# Patient Record
Sex: Male | Born: 1965
Health system: Southern US, Community
[De-identification: ages and names within clinical notes are randomized; demographics above are authoritative.]

## PROBLEM LIST (undated history)

## (undated) DIAGNOSIS — K219 Gastro-esophageal reflux disease without esophagitis: Secondary | ICD-10-CM

## (undated) DIAGNOSIS — M549 Dorsalgia, unspecified: Secondary | ICD-10-CM

## (undated) DIAGNOSIS — F319 Bipolar disorder, unspecified: Secondary | ICD-10-CM

## (undated) DIAGNOSIS — F4310 Post-traumatic stress disorder, unspecified: Secondary | ICD-10-CM

## (undated) DIAGNOSIS — G8929 Other chronic pain: Secondary | ICD-10-CM

## (undated) DIAGNOSIS — I10 Essential (primary) hypertension: Secondary | ICD-10-CM

## (undated) HISTORY — DX: Bipolar disorder, unspecified: F31.9

## (undated) HISTORY — DX: Essential (primary) hypertension: I10

## (undated) HISTORY — DX: Other chronic pain: G89.29

## (undated) HISTORY — DX: Dorsalgia, unspecified: M54.9

## (undated) HISTORY — DX: Post-traumatic stress disorder, unspecified: F43.10

## (undated) HISTORY — DX: Gastro-esophageal reflux disease without esophagitis: K21.9

## (undated) HISTORY — PX: NO PAST SURGERIES: SHX2092

---

## 2000-07-01 ENCOUNTER — Ambulatory Visit (HOSPITAL_COMMUNITY): Admission: RE | Admit: 2000-07-01 | Discharge: 2000-07-01 | Payer: Self-pay | Admitting: Family Medicine

## 2000-07-01 ENCOUNTER — Encounter: Payer: Self-pay | Admitting: Family Medicine

## 2000-07-09 ENCOUNTER — Encounter: Payer: Self-pay | Admitting: Family Medicine

## 2000-07-09 ENCOUNTER — Ambulatory Visit (HOSPITAL_COMMUNITY): Admission: RE | Admit: 2000-07-09 | Discharge: 2000-07-09 | Payer: Self-pay | Admitting: Family Medicine

## 2000-08-19 ENCOUNTER — Ambulatory Visit (HOSPITAL_BASED_OUTPATIENT_CLINIC_OR_DEPARTMENT_OTHER): Admission: RE | Admit: 2000-08-19 | Discharge: 2000-08-19 | Payer: Self-pay | Admitting: *Deleted

## 2003-09-08 ENCOUNTER — Ambulatory Visit (HOSPITAL_COMMUNITY): Admission: RE | Admit: 2003-09-08 | Discharge: 2003-09-08 | Payer: Self-pay | Admitting: Family Medicine

## 2003-10-19 ENCOUNTER — Emergency Department (HOSPITAL_COMMUNITY): Admission: EM | Admit: 2003-10-19 | Discharge: 2003-10-19 | Payer: Self-pay | Admitting: *Deleted

## 2003-11-16 ENCOUNTER — Ambulatory Visit (HOSPITAL_COMMUNITY): Admission: RE | Admit: 2003-11-16 | Discharge: 2003-11-16 | Payer: Self-pay | Admitting: Family Medicine

## 2003-11-30 ENCOUNTER — Ambulatory Visit: Payer: Self-pay | Admitting: Family Medicine

## 2003-12-28 ENCOUNTER — Ambulatory Visit: Payer: Self-pay | Admitting: Family Medicine

## 2004-01-26 ENCOUNTER — Ambulatory Visit: Payer: Self-pay | Admitting: Family Medicine

## 2004-04-01 ENCOUNTER — Ambulatory Visit: Payer: Self-pay | Admitting: Family Medicine

## 2004-04-24 ENCOUNTER — Ambulatory Visit: Payer: Self-pay | Admitting: Family Medicine

## 2004-05-01 ENCOUNTER — Encounter (HOSPITAL_COMMUNITY): Admission: RE | Admit: 2004-05-01 | Discharge: 2004-05-31 | Payer: Self-pay | Admitting: Family Medicine

## 2004-05-15 ENCOUNTER — Ambulatory Visit: Payer: Self-pay | Admitting: Family Medicine

## 2004-06-14 ENCOUNTER — Ambulatory Visit: Payer: Self-pay | Admitting: Family Medicine

## 2004-07-24 ENCOUNTER — Ambulatory Visit: Payer: Self-pay | Admitting: Family Medicine

## 2004-07-26 ENCOUNTER — Ambulatory Visit (HOSPITAL_COMMUNITY): Admission: RE | Admit: 2004-07-26 | Discharge: 2004-07-26 | Payer: Self-pay | Admitting: Family Medicine

## 2004-07-31 ENCOUNTER — Ambulatory Visit: Payer: Self-pay | Admitting: Family Medicine

## 2004-09-09 ENCOUNTER — Ambulatory Visit: Payer: Self-pay | Admitting: Family Medicine

## 2005-10-13 ENCOUNTER — Ambulatory Visit: Payer: Self-pay | Admitting: Family Medicine

## 2006-04-06 ENCOUNTER — Ambulatory Visit: Payer: Self-pay | Admitting: Family Medicine

## 2006-04-16 LAB — HM COLONOSCOPY: HM Colonoscopy: NORMAL

## 2006-10-26 ENCOUNTER — Ambulatory Visit: Payer: Self-pay | Admitting: Family Medicine

## 2006-11-17 ENCOUNTER — Emergency Department (HOSPITAL_COMMUNITY): Admission: EM | Admit: 2006-11-17 | Discharge: 2006-11-17 | Payer: Self-pay | Admitting: Emergency Medicine

## 2006-11-26 ENCOUNTER — Ambulatory Visit: Payer: Self-pay | Admitting: Family Medicine

## 2006-12-08 ENCOUNTER — Ambulatory Visit (HOSPITAL_COMMUNITY): Admission: RE | Admit: 2006-12-08 | Discharge: 2006-12-08 | Payer: Self-pay | Admitting: Family Medicine

## 2007-01-18 ENCOUNTER — Ambulatory Visit: Payer: Self-pay | Admitting: Family Medicine

## 2007-01-28 ENCOUNTER — Encounter: Payer: Self-pay | Admitting: Family Medicine

## 2007-08-13 DIAGNOSIS — K219 Gastro-esophageal reflux disease without esophagitis: Secondary | ICD-10-CM | POA: Insufficient documentation

## 2007-08-13 DIAGNOSIS — J329 Chronic sinusitis, unspecified: Secondary | ICD-10-CM | POA: Insufficient documentation

## 2007-08-13 DIAGNOSIS — J302 Other seasonal allergic rhinitis: Secondary | ICD-10-CM | POA: Insufficient documentation

## 2007-08-13 DIAGNOSIS — M549 Dorsalgia, unspecified: Secondary | ICD-10-CM | POA: Insufficient documentation

## 2007-08-13 HISTORY — DX: Chronic sinusitis, unspecified: J32.9

## 2007-11-02 ENCOUNTER — Ambulatory Visit: Payer: Self-pay | Admitting: Family Medicine

## 2007-11-05 ENCOUNTER — Encounter: Payer: Self-pay | Admitting: Family Medicine

## 2007-11-05 LAB — CONVERTED CEMR LAB
BUN: 10 mg/dL (ref 6–23)
Basophils Absolute: 0 10*3/uL (ref 0.0–0.1)
Basophils Relative: 1 % (ref 0–1)
CO2: 24 meq/L (ref 19–32)
Calcium: 8.7 mg/dL (ref 8.4–10.5)
Chloride: 107 meq/L (ref 96–112)
Cholesterol: 154 mg/dL (ref 0–200)
Creatinine, Ser: 1.16 mg/dL (ref 0.40–1.50)
Eosinophils Absolute: 0.6 10*3/uL (ref 0.0–0.7)
Eosinophils Relative: 14 % — ABNORMAL HIGH (ref 0–5)
Glucose, Bld: 78 mg/dL (ref 70–99)
HCT: 44.6 % (ref 39.0–52.0)
HDL: 50 mg/dL (ref >39)
Hemoglobin: 14.4 g/dL (ref 13.0–17.0)
LDL Cholesterol: 94 mg/dL (ref 0–99)
Lymphocytes Relative: 54 % — ABNORMAL HIGH (ref 12–46)
Lymphs Abs: 2.2 10*3/uL (ref 0.7–4.0)
MCHC: 32.3 g/dL (ref 30.0–36.0)
MCV: 88.7 fL (ref 78.0–100.0)
Monocytes Absolute: 0.3 10*3/uL (ref 0.1–1.0)
Monocytes Relative: 7 % (ref 3–12)
Neutro Abs: 1 10*3/uL — ABNORMAL LOW (ref 1.7–7.7)
Neutrophils Relative %: 25 % — ABNORMAL LOW (ref 43–77)
PSA: 0.59 ng/mL (ref 0.10–4.00)
Platelets: 189 10*3/uL (ref 150–400)
Potassium: 4.7 meq/L (ref 3.5–5.3)
RBC: 5.03 M/uL (ref 4.22–5.81)
RDW: 12.6 % (ref 11.5–15.5)
Sodium: 142 meq/L (ref 135–145)
Total CHOL/HDL Ratio: 3.1
Triglycerides: 50 mg/dL (ref <150)
VLDL: 10 mg/dL (ref 0–40)
WBC: 4 10*3/uL (ref 4.0–10.5)

## 2007-11-07 DIAGNOSIS — F528 Other sexual dysfunction not due to a substance or known physiological condition: Secondary | ICD-10-CM | POA: Insufficient documentation

## 2007-11-19 ENCOUNTER — Telehealth: Payer: Self-pay | Admitting: Family Medicine

## 2008-04-13 ENCOUNTER — Ambulatory Visit: Payer: Self-pay | Admitting: Family Medicine

## 2008-04-13 DIAGNOSIS — IMO0002 Reserved for concepts with insufficient information to code with codable children: Secondary | ICD-10-CM | POA: Insufficient documentation

## 2008-04-14 ENCOUNTER — Telehealth: Payer: Self-pay | Admitting: Family Medicine

## 2008-04-14 ENCOUNTER — Encounter: Payer: Self-pay | Admitting: Family Medicine

## 2008-04-18 ENCOUNTER — Telehealth: Payer: Self-pay | Admitting: Family Medicine

## 2008-05-02 ENCOUNTER — Telehealth: Payer: Self-pay | Admitting: Family Medicine

## 2008-06-12 ENCOUNTER — Telehealth: Payer: Self-pay | Admitting: Family Medicine

## 2008-06-19 ENCOUNTER — Ambulatory Visit: Payer: Self-pay | Admitting: Family Medicine

## 2008-06-19 DIAGNOSIS — I10 Essential (primary) hypertension: Secondary | ICD-10-CM | POA: Insufficient documentation

## 2008-09-25 ENCOUNTER — Ambulatory Visit: Payer: Self-pay | Admitting: Family Medicine

## 2008-09-28 ENCOUNTER — Telehealth: Payer: Self-pay | Admitting: Family Medicine

## 2008-12-05 ENCOUNTER — Ambulatory Visit: Payer: Self-pay | Admitting: Family Medicine

## 2008-12-05 ENCOUNTER — Ambulatory Visit (HOSPITAL_COMMUNITY): Admission: RE | Admit: 2008-12-05 | Discharge: 2008-12-05 | Payer: Self-pay | Admitting: Family Medicine

## 2008-12-05 DIAGNOSIS — R109 Unspecified abdominal pain: Secondary | ICD-10-CM | POA: Insufficient documentation

## 2008-12-05 DIAGNOSIS — R1084 Generalized abdominal pain: Secondary | ICD-10-CM | POA: Insufficient documentation

## 2008-12-05 DIAGNOSIS — N3000 Acute cystitis without hematuria: Secondary | ICD-10-CM | POA: Insufficient documentation

## 2008-12-05 DIAGNOSIS — R079 Chest pain, unspecified: Secondary | ICD-10-CM | POA: Insufficient documentation

## 2008-12-05 DIAGNOSIS — J310 Chronic rhinitis: Secondary | ICD-10-CM | POA: Insufficient documentation

## 2008-12-05 LAB — CONVERTED CEMR LAB
Amylase: 104 units/L (ref 27–131)
Bilirubin Urine: NEGATIVE
Blood in Urine, dipstick: NEGATIVE
Glucose, Urine, Semiquant: NEGATIVE
Ketones, urine, test strip: NEGATIVE
Nitrite: NEGATIVE
Protein, U semiquant: NEGATIVE
Specific Gravity, Urine: 1.02
Urobilinogen, UA: 0.2
WBC Urine, dipstick: NEGATIVE
pH: 6.5

## 2008-12-06 ENCOUNTER — Encounter: Payer: Self-pay | Admitting: Family Medicine

## 2008-12-06 LAB — CONVERTED CEMR LAB
ALT: 13 units/L (ref 0–53)
AST: 18 units/L (ref 0–37)
Albumin: 4.3 g/dL (ref 3.5–5.2)
Alkaline Phosphatase: 56 units/L (ref 39–117)
BUN: 14 mg/dL (ref 6–23)
Bilirubin, Direct: 0.1 mg/dL (ref 0.0–0.3)
CO2: 27 meq/L (ref 19–32)
Calcium: 9.2 mg/dL (ref 8.4–10.5)
Chloride: 103 meq/L (ref 96–112)
Creatinine, Ser: 0.99 mg/dL (ref 0.40–1.50)
Glucose, Bld: 80 mg/dL (ref 70–99)
Indirect Bilirubin: 0.4 mg/dL (ref 0.0–0.9)
Lipase: 22 units/L (ref 0–75)
PSA: 0.9 ng/mL (ref 0.10–4.00)
Potassium: 4.3 meq/L (ref 3.5–5.3)
Sodium: 139 meq/L (ref 135–145)
Total Bilirubin: 0.5 mg/dL (ref 0.3–1.2)
Total Protein: 7.3 g/dL (ref 6.0–8.3)

## 2008-12-07 ENCOUNTER — Ambulatory Visit (HOSPITAL_COMMUNITY): Admission: RE | Admit: 2008-12-07 | Discharge: 2008-12-07 | Payer: Self-pay | Admitting: Family Medicine

## 2008-12-08 ENCOUNTER — Telehealth: Payer: Self-pay | Admitting: Family Medicine

## 2009-05-17 ENCOUNTER — Telehealth: Payer: Self-pay | Admitting: Family Medicine

## 2009-07-07 ENCOUNTER — Emergency Department (HOSPITAL_COMMUNITY): Admission: EM | Admit: 2009-07-07 | Discharge: 2009-07-07 | Payer: Self-pay | Admitting: Emergency Medicine

## 2009-07-09 ENCOUNTER — Telehealth: Payer: Self-pay | Admitting: Family Medicine

## 2009-08-14 ENCOUNTER — Telehealth: Payer: Self-pay | Admitting: Family Medicine

## 2009-09-05 ENCOUNTER — Ambulatory Visit: Payer: Self-pay | Admitting: Family Medicine

## 2009-09-05 DIAGNOSIS — L0390 Cellulitis, unspecified: Secondary | ICD-10-CM

## 2009-09-05 DIAGNOSIS — L0291 Cutaneous abscess, unspecified: Secondary | ICD-10-CM | POA: Insufficient documentation

## 2009-09-06 ENCOUNTER — Ambulatory Visit: Payer: Self-pay | Admitting: Family Medicine

## 2009-09-07 ENCOUNTER — Telehealth: Payer: Self-pay | Admitting: Family Medicine

## 2009-09-13 ENCOUNTER — Ambulatory Visit: Payer: Self-pay | Admitting: Family Medicine

## 2009-10-19 ENCOUNTER — Telehealth: Payer: Self-pay | Admitting: Family Medicine

## 2009-10-23 ENCOUNTER — Ambulatory Visit: Payer: Self-pay | Admitting: Family Medicine

## 2009-11-22 ENCOUNTER — Telehealth: Payer: Self-pay | Admitting: Family Medicine

## 2009-12-24 ENCOUNTER — Telehealth: Payer: Self-pay | Admitting: Family Medicine

## 2010-01-22 ENCOUNTER — Ambulatory Visit: Payer: Self-pay | Admitting: Family Medicine

## 2010-01-30 ENCOUNTER — Telehealth: Payer: Self-pay | Admitting: Family Medicine

## 2010-01-31 ENCOUNTER — Telehealth: Payer: Self-pay | Admitting: Family Medicine

## 2010-02-07 ENCOUNTER — Encounter: Payer: Self-pay | Admitting: Family Medicine

## 2010-02-20 DIAGNOSIS — R519 Headache, unspecified: Secondary | ICD-10-CM | POA: Insufficient documentation

## 2010-02-20 DIAGNOSIS — R51 Headache: Secondary | ICD-10-CM | POA: Insufficient documentation

## 2010-02-28 NOTE — Assessment & Plan Note (Signed)
Summary: recheck Boil ROOM 2   Vital Signs:  Patient profile:   45 year old male Height:      71 inches Weight:      192 pounds BMI:     26.88 O2 Sat:      97 % Pulse rate:   84 / minute Resp:     16 per minute BP sitting:   122 / 84  (left arm) Cuff size:   large  Vitals Entered By: Everitt Amber LPN (September 13, 2009 8:35 AM) CC: Follow up on boil, feeling alot better   CC:  Follow up on boil and feeling alot better.  History of Present Illness: Pt presents today for f/u of abscess Lt axilla.  Had I&D done and is taking antibiotic as prescribed. States it is feeling much better.  Swelling much smaller.  And not painful any longer. No drainage.  Current Medications (verified): 1)  Ibuprofen 800 Mg Tabs (Ibuprofen) .... One Tab By Mouth Tid 2)  Vicodin Hp 10-660 Mg Tabs (Hydrocodone-Acetaminophen) .... Take 1 Tab By Mouth At Bedtime As Needed 3)  Doxycycline Hyclate 100 Mg Caps (Doxycycline Hyclate) .... Take 1 Capsule By Mouth Two Times A Day 4)  Benazepril Hcl 10 Mg Tabs (Benazepril Hcl) .... Take 1 Tablet By Mouth Once A Day  Allergies (verified): 1)  ! Sulfa  Review of Systems General:  Denies chills and fever.  Physical Exam  General:  Well-developed,well-nourished,in no acute distress; alert,appropriate and cooperative throughout examination Skin:  Abscess Lt axilla significantly improved.  Mild induration noted at abscess site, but mass/swelling no longer palpable.  Very faint erythema.  Nontender Axillary Nodes:  no L axillary adenopathy.     Impression & Recommendations:  Problem # 1:  ABSCESS (ICD-682.9) Assessment Improved Advised pt to complete antibiotic prescription as prescribed.  Recheck if recurrs.  His updated medication list for this problem includes:    Doxycycline Hyclate 100 Mg Caps (Doxycycline hyclate) .Marland Kitchen... Take 1 capsule by mouth two times a day  Complete Medication List: 1)  Ibuprofen 800 Mg Tabs (Ibuprofen) .... One tab by mouth tid 2)   Vicodin Hp 10-660 Mg Tabs (Hydrocodone-acetaminophen) .... Take 1 tab by mouth at bedtime as needed 3)  Doxycycline Hyclate 100 Mg Caps (Doxycycline hyclate) .... Take 1 capsule by mouth two times a day 4)  Benazepril Hcl 10 Mg Tabs (Benazepril hcl) .... Take 1 tablet by mouth once a day  Patient Instructions: 1)  Please schedule a follow-up appointment as needed.  If your syptoms return call for an appoitment. 2)  Complete your antibiotic as prescribed.

## 2010-02-28 NOTE — Assessment & Plan Note (Signed)
Summary: OV   Vital Signs:  Patient profile:   45 year old male Height:      71 inches (180.34 cm) Weight:      191 pounds (86.82 kg) BMI:     26.74 BSA:     2.07 O2 Sat:      98 % Pulse rate:   91 / minute Pulse rhythm:   regular Resp:     16 per minute BP sitting:   140 / 90  (left arm) Cuff size:   regular  Vitals Entered By: Everitt Amber (Jun 19, 2008 3:10 PM)  Nutrition Counseling: Patient's BMI is greater than 25 and therefore counseled on weight management options. CC: rash under right armpit,itches and hurts   CC:  rash under right armpit and itches and hurts.  History of Present Illness: Pt. has 2 week h/o infection in his r axilla, this is the third episode in the past 4 months.He hs had no fever or chills, and this episode is not as severe as the initial one. He has never had i/D of the area.  He does not shave and has stopped usingdeodorant. He reports uncontrolled allergysymptoms though he iscurrently receiving immunotherapy. He denies depression or anxiety and remains unemployed.  Current Medications (verified): 1)  None  Allergies (verified): No Known Drug Allergies  Review of Systems      See HPI ENT:  Complains of nasal congestion and postnasal drainage; denies decreased hearing, sinus pressure, and sore throat. CV:  Denies chest pain or discomfort, palpitations, shortness of breath with exertion, and swelling of feet. Resp:  Denies cough, sputum productive, and wheezing. GI:  Denies abdominal pain, constipation, diarrhea, nausea, and vomiting. GU:  Denies dysuria and urinary frequency. Derm:  See HPI. Allergy:  Complains of seasonal allergies and sneezing; uncontrolled.  Physical Exam  General:  alert, well-nourished, and well-hydrated.  HEENT: No facial asymmetry,  EOMI, No sinus tenderness, TM's Clear, oropharynx  pink and moist. Erythema and edema of nasal mucosa.  Chest: Clear to auscultation bilaterally.  CVS: S1, S2, No murmurs, No S3.   Abd: Soft, Nontender.  MS: Adequate ROM spine, hips, shoulders and knees.  Ext: No edema.   CNS: CN 2-12 intact, power tone and sensation normal throughout.   Skin:hyperpigmented nodles with erythematous rash under right axilla, tender  Psych: Good eye contact, normal affect.  Memory intact, not anxious or depressed appearing.     Impression & Recommendations:  Problem # 1:  ESSENTIAL HYPERTENSION (ICD-401.9) Assessment Comment Only  His updated medication list for this problem includes:    Hydrochlorothiazide 25 Mg Tabs (Hydrochlorothiazide) .Marland Kitchen... Take 1 tablet by mouth once a daypt remains in denial and non compliant with meds  BP today: 140/90 Prior BP: 124/90 (04/13/2008)  Labs Reviewed: K+: 4.7 (11/05/2007) Creat: : 1.16 (11/05/2007)   Chol: 154 (11/05/2007)   HDL: 50 (11/05/2007)   LDL: 94 (11/05/2007)   TG: 50 (11/05/2007)  Problem # 2:  ABSCESS, AXILLA, RIGHT (ICD-682.3) Assessment: Comment Only  The following medications were removed from the medication list:    Doxycycline Hyclate 100 Mg Caps (Doxycycline hyclate) ..... One cap by mouth bid His updated medication list for this problem includes:    Doxycycline Hyclate 100 Mg Tabs (Doxycycline hyclate) .Marland Kitchen... Take 1 tablet by mouth two times a day  Orders: Rocephin  250mg  (Z6109) Admin of Therapeutic Inj  intramuscular or subcutaneous (60454)  Problem # 3:  ALLERGIC RHINITIS (ICD-477.9) Assessment: Deteriorated  His updated medication list for  this problem includes:    Xyzal 5 Mg Tabs (Levocetirizine dihydrochloride) .Marland Kitchen... Take 1 tablet by mouth once a day  Orders: Depo- Medrol 80mg  (J1040)  Complete Medication List: 1)  Doxycycline Hyclate 100 Mg Tabs (Doxycycline hyclate) .... Take 1 tablet by mouth two times a day 2)  Xyzal 5 Mg Tabs (Levocetirizine dihydrochloride) .... Take 1 tablet by mouth once a day 3)  Vicodin Es 7.5-750 Mg Tabs (Hydrocodone-acetaminophen) .... Take 1 tab by mouth at bedtime as  needed 4)  Bactroban 2 % Oint (Mupirocin) .... Apply to affected area twice daily as needed 5)  Hydrochlorothiazide 25 Mg Tabs (Hydrochlorothiazide) .... Take 1 tablet by mouth once a day 6)  Klor-con M20 20 Meq Cr-tabs (Potassium chloride crys cr) .... Take 1 tablet by mouth once a day  Other Orders: T-Lipid Profile [phone]) T-Basic Metabolic Panel [phone]) T-CBC w/Diff (32440-10272) T-PSA (53664-40347)  Patient Instructions: 1)  CPE in 4 months. 2)  you will get injections for allergy and infectiion and meds are sent to your pharmacy aklso. 3)  Your BP is high 140/90, meds have been sent in. 4)  BMP prior to visit, ICD-9: 5)  Lipid Panel prior to visit, ICD-9:   Mid to end october. 6)  CBC w/ Diff prior to visit, ICD-9: 7)  PSA prior to visit, ICD-9: Prescriptions: KLOR-CON M20 20 MEQ CR-TABS (POTASSIUM CHLORIDE CRYS CR) Take 1 tablet by mouth once a day  #30 x 4   Entered and Authorized by:   Syliva Overman MD   Signed by:   Syliva Overman MD on 06/19/2008   Method used:   [facility]       [address]       [place], [place]  [address]       Ph: [phone]       Fax: [phone]   RxID:   [id number] HYDROCHLOROTHIAZIDE 25 MG TABS (HYDROCHLOROTHIAZIDE) Take 1 tablet by mouth once a day  #30 x 4   Entered and Authorized by:   Syliva Overman MD   Signed by:   Syliva Overman MD on 06/19/2008   Method used:   [facility]        [facility] ([facility]ail)       [address]       [place], [place]  [address]       Ph: [phone]       Fax: [phone]   RxID:   [id number] BACTROBAN 2 % OINT (MUPIROCIN) apply to affected area twice daily as needed  #30gm x 1   Entered and Authorized by:   Syliva Overman MD   Signed by:   Syliva Overman MD on 06/19/2008   Method used:   [facility]        [facility] ([facility])       [address]       [place], [place]  [address]        Ph: [phone]       Fax: [phone]   RxID:   [id number] VICODIN ES 7.5-750 MG TABS (HYDROCODONE-ACETAMINOPHEN) Take 1 tab by mouth at bedtime as needed  #20 x 0   Entered and Authorized by:   Syliva Overman MD   Signed by:   Syliva Overman MD on 06/19/2008   Method used:   Printed then faxed to ...       [facility] [facility]  [address] ([facility]ail)       [address]       [place], [place]  [address]       Ph: [phone]       Fax: [phone]   RxID:   [id number] XYZAL 5 MG TABS (LEVOCETIRIZINE DIHYDROCHLORIDE) Take 1 tablet by mouth once a day  #30 x 5   Entered and Authorized by:   Syliva Overman MD   Signed by:   Syliva Overman MD on 06/19/2008   Method used:   Electronically to        [facility]. [phone]* (retail)       [address]       [place], [place]  [address]       Ph: [phone] or [phone]       Fax: [phone]   RxID:   [id number] DOXYCYCLINE HYCLATE 100 MG TABS (DOXYCYCLINE HYCLATE) Take 1 tablet by mouth two times a day  #20 x 0   Entered and Authorized by:   Syliva Overman MD   Signed by:   Syliva Overman MD on 06/19/2008   Method used:   Electronically to        [facility] [phone]* (retail)       [address]       [place], [place]  [address]       Ph: [phone] or [phone]       Fax: [phone]   RxID:   [id number]    Medication Administration  Injection # 1:    Medication: Depo- Medrol 80mg     Diagnosis: ALLERGIC RHINITIS (ICD-477.9)    Route: IM    Site: RUOQ gluteus    Exp Date: 7/12    Lot #: [id number]    Mfr: Pharmacia    Patient tolerated injection without complications    Given by: Worthy Keeler LPN (Jun 19, 2008 4:11 PM)  Injection # 2:    Medication: Rocephin  250mg     Diagnosis: ABSCESS, AXILLA, RIGHT (ICD-682.3)    Route: IM    Site: LUOQ gluteus    Exp Date: 9/12    Lot #: [id number]    Mfr: rocephin    Comments: rocephin 500mg  given    Patient tolerated  injection without complications    Given by: Worthy Keeler LPN (Jun 19, 2008 4:12 PM)  Orders Added: 1)  Est. Patient Level IV [[address]] 2)  T-Lipid Profile [80061-22930] 3)  T-Basic Metabolic Panel [99371-69678] 4)  T-CBC w/Diff [93810-17510] 5)  T-PSA [25852-77824] 6)  Depo- Medrol 80mg  [J1040] 7)  Rocephin  250mg  [J0696] 8)  Admin of Therapeutic Inj  intramuscular or subcutaneous [23536]

## 2010-02-28 NOTE — Letter (Signed)
Summary: progress notes  progress notes   Imported By: Curtis Sites 07/18/2009 11:59:06  _____________________________________________________________________  External Attachment:    Type:   Image     Comment:   External Document

## 2010-02-28 NOTE — Assessment & Plan Note (Signed)
Summary: ABCESS UNDER ARM--Room 1   Vital Signs:  Patient profile:   45 year old male Height:      71 inches Weight:      193 pounds BMI:     27.02 O2 Sat:      98 % Temp:     98.1 degrees F oral Pulse rate:   82 / minute Resp:     16 per minute BP sitting:   130 / 82  (left arm) Cuff size:   large  Vitals Entered By: Everitt Amber LPN (September 06, 2009 8:13 AM)  Procedure Note Last Tetanus: Tdap (04/13/2008)  Incision & Drainage: The patient complains of pain, redness, tenderness, and swelling but denies discharge and fever. Indication: infected lesion Consent signed: yes  Procedure # 1: I & D    Size (in cm): 1.5 x 3.0    Comment: Lt mid axilla    Instrument used: #11 blade    Anesthesia: 0.5 ml 1% lidocaine w/epinephrine  Cleaned and prepped with: betadine Instructions: daily dressing changes Additional Instructions: Telfa nonadherant gauze and dressing applied  CC: Patient to have abscess drained under left arm    CC:  Patient to have abscess drained under left arm .  History of Present Illness: Pt was seen by Dr Lodema Hong yesterday for an abscess Lt axilla.  He returns today for I & D.  He states this developed a couple of days ago and has increased in size.  Denies fever or chills.  No drainage.  Hx of same in Rt axilla a couple yrs ago. Saw surg for consult, but states he was told he did not need excision. Pt was prescribed antibiotic yesterday.  States he plans to pick up the prescription this am and start  it.    Current Medications (verified): 1)  Ibuprofen 800 Mg Tabs (Ibuprofen) .... One Tab By Mouth Tid 2)  Vicodin Hp 10-660 Mg Tabs (Hydrocodone-Acetaminophen) .... Take 1 Tab By Mouth At Bedtime As Needed 3)  Doxycycline Hyclate 100 Mg Caps (Doxycycline Hyclate) .... Take 1 Capsule By Mouth Two Times A Day 4)  Benazepril Hcl 10 Mg Tabs (Benazepril Hcl) .... Take 1 Tablet By Mouth Once A Day  Allergies (verified): 1)  ! Sulfa  Review of Systems General:   Denies chills and fever. Derm:  Complains of lesion(s).  Physical Exam  General:  Well-developed,well-nourished,in no acute distress; alert,appropriate and cooperative throughout examination Head:  Normocephalic and atraumatic without obvious abnormalities. No apparent alopecia or balding. Skin:  Lt mid axilla approx 1.5 x 3 cm erythematous tender fluctuant nodule noted.  No pustular head. Axillary Nodes:  no L axillary adenopathy.     Impression & Recommendations:  Problem # 1:  ABSCESS (ICD-682.9) Assessment New  His updated medication list for this problem includes:    Doxycycline Hyclate 100 Mg Caps (Doxycycline hyclate) .Marland Kitchen... Take 1 capsule by mouth two times a day  Orders: T-Culture, Wound (87070/87205-70190) I&D Abscess, Simple / Single (10060)  Complete Medication List: 1)  Ibuprofen 800 Mg Tabs (Ibuprofen) .... One tab by mouth tid 2)  Vicodin Hp 10-660 Mg Tabs (Hydrocodone-acetaminophen) .... Take 1 tab by mouth at bedtime as needed 3)  Doxycycline Hyclate 100 Mg Caps (Doxycycline hyclate) .... Take 1 capsule by mouth two times a day 4)  Benazepril Hcl 10 Mg Tabs (Benazepril hcl) .... Take 1 tablet by mouth once a day  Patient Instructions: 1)  Recheck appt in one week. 2)  Watch for increased redness,  increased swelling, and fever. 3)  Wash area daily with soap and water. 4)  You will need  to put a dressing over the area until the incision site is healed.  Change the dressing once daily.  You may change more often if needed. 5)  Start the antibiotic Dr Lodema Hong prescribed for you as soon as possible.

## 2010-02-28 NOTE — Letter (Signed)
Summary: misc  misc   Imported By: Curtis Sites 07/18/2009 11:58:36  _____________________________________________________________________  External Attachment:    Type:   Image     Comment:   External Document

## 2010-02-28 NOTE — Progress Notes (Signed)
Summary: sinus infection  Phone Note Call from Patient   Summary of Call: pt has a sinus infection and would luike to see if he can geta rx called in. 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 AND CAN NOT MISS ANY DAYS PLEASE [facility] WITH AMANDA Additional Follow-up by: Lind Guest,  July 09, 2009 1:20 PM    Additional Follow-up for Phone Call Additional follow up Details #2::    let them know this one time only, otherwise urgent care and erx septrads one twice daily #20 and tessalon perles 100mg  one 3 times daily #30, pls  document more detail of his symptoms before faxing Follow-up by: Syliva Overman MD,  July 09, 2009 1:40 PM  Additional Follow-up for Phone Call Additional follow up Details #3:: Details for Additional Follow-up Action Taken: fever, sinus pressure, headache, yellow mucous with some blood rx sent, patient wife aware Additional Follow-up by: Adella Hare LPN,  July 09, 2009 1:49 PM  New/Updated Medications: SEPTRA DS 800-160 MG TABS (SULFAMETHOXAZOLE-TRIMETHOPRIM) one tab by mouth two times a day TESSALON PERLES 100 MG CAPS (BENZONATATE) one cap by mouth three times a day Prescriptions: TESSALON PERLES 100 MG CAPS (BENZONATATE) one cap by mouth three times a day  #30 x 0   Entered by:   Adella Hare LPN   Authorized by:   Syliva Overman MD   Signed by:   Adella Hare LPN on 45/40/9811   Method used:   Electronically to        [facility] [phone]* (retail)       [address]       [place], [place]  [address]       Ph: [phone] or [phone]       Fax: [phone]   RxID:    [id number] SEPTRA DS 800-160 MG TABS (SULFAMETHOXAZOLE-TRIMETHOPRIM) one tab by mouth two times a day  #20 x 0   Entered by:   Adella Hare LPN   Authorized by:   Syliva Overman MD   Signed by:   Adella Hare LPN on 38/75/6433   Method used:   Electronically to        [facility] [phone]* (retail)       [address]       [place], [place]  [address]       Ph: [phone] or [phone]       Fax: [phone]   RxID:   [id number]

## 2010-02-28 NOTE — Medication Information (Signed)
Summary: Tax adviser   Imported By: Lind Guest 04/14/2008 14:31:01  _____________________________________________________________________  External Attachment:    Type:   Image     Comment:   External Document

## 2010-02-28 NOTE — Assessment & Plan Note (Signed)
Summary: office visit room 3   Vital Signs:  Patient profile:   45 year old male Height:      71 inches Weight:      195.75 pounds BMI:     27.40 O2 Sat:      99 % on Room air Pulse rate:   91 / minute Resp:     16 per minute BP sitting:   130 / 94  (left arm) Cuff size:   large  Vitals Entered By: Everitt Amber LPN (October 23, 2009 4:14 PM) CC: has been doubling up on his pain pills because of his lower back pain and he feels like he is getting some more boils under his left arm    CC:  has been doubling up on his pain pills because of his lower back pain and he feels like he is getting some more boils under his left arm .  History of Present Illness: HX of chronic LBP, 3 bulging discs.  Was taking one hydrocodone pill at bedtime but has been needing an extra one at noon.  Is working [employer] representative, Chiropodist.  So alot of bending, twisting and pulling.  Had been to neurosurg yrs ago and had PT.  Is uncertain if he had epidural injections though.  Pain is in Lt lower back and radiates to Lt buttock. Pt is not driving during the day, nor operating machinery.  Noticed when he showered that he had 2 tender lumps in his Lt armpit.  He is worried that he is getting another abscess and wants to start on antibiotic.  No fever or chills.    Current Medications (verified): 1)  Ibuprofen 800 Mg Tabs (Ibuprofen) .... One Tab By Mouth Tid 2)  Vicodin Hp 10-660 Mg Tabs (Hydrocodone-Acetaminophen) .... Take 1 Tab By Mouth At Bedtime As Needed 3)  Doxycycline Hyclate 100 Mg Caps (Doxycycline Hyclate) .... Take 1 Capsule By Mouth Two Times A Day 4)  Benazepril Hcl 10 Mg Tabs (Benazepril Hcl) .... Take 1 Tablet By Mouth Once A Day  Allergies (verified): 1)  ! Sulfa  Past History:  Past medical history reviewed for relevance to current acute and chronic problems.  Past Medical History: Reviewed history from 08/13/2007 and no changes required. Current Problems:  ALLERGIC RHINITIS  (ICD-477.9) GERD (ICD-530.81) HYPERTENSION (ICD-401.9) SINUSITIS (ICD-473.9) BACK PAIN (ICD-724.5)  Review of Systems General:  Denies chills and fever. MS:  Complains of low back pain; denies mid back pain. Neuro:  Denies numbness and tingling.  Physical Exam  General:  Well-developed,well-nourished,in no acute distress; alert,appropriate and cooperative throughout examination Head:  Normocephalic and atraumatic without obvious abnormalities. No apparent alopecia or balding. Lungs:  Normal respiratory effort, chest expands symmetrically. Lungs are clear to auscultation, no crackles or wheezes. Heart:  Normal rate and regular rhythm. S1 and S2 normal without gallop, murmur, click, rub or other extra sounds. Msk:  LS spine:  Nontender spinous processes, paraspinal musculature and SI joints bilat.  TTP LT sciatic notch. Extremities:  No clubbing, cyanosis, edema, or deformity noted with normal full range of motion of all joints.   Neurologic:  alert & oriented X3, strength normal in all extremities, gait normal, and DTRs symmetrical and normal.   Skin:  tender < 1 cm nodule Lt mid axilla without erythema Axillary Nodes:  one tender < 1 cm Lt axillary node. Psych:  Cognition and judgment appear intact. Alert and cooperative with normal attention span and concentration. No apparent delusions, illusions, hallucinations   Impression &  Recommendations:  Problem # 1:  BACK PAIN (ICD-724.5) Assessment Comment Only  His updated medication list for this problem includes:    Ibuprofen 800 Mg Tabs (Ibuprofen) ..... One tab by mouth tid    Vicodin Hp 10-660 Mg Tabs (Hydrocodone-acetaminophen) .Marland Kitchen... Take 1 tab by mouth two times a day  as needed  Orders: Pain Clinic Referral (Pain)  Problem # 2:  ABSCESS (ICD-682.9) Assessment: New  The following medications were removed from the medication list:    Doxycycline Hyclate 100 Mg Caps (Doxycycline hyclate) .Marland Kitchen... Take 1 capsule by mouth two  times a day His updated medication list for this problem includes:    Doxycycline Hyclate 100 Mg Caps (Doxycycline hyclate) .Marland Kitchen... Take 1 tab two times a day x 10 days  Complete Medication List: 1)  Ibuprofen 800 Mg Tabs (Ibuprofen) .... One tab by mouth tid 2)  Vicodin Hp 10-660 Mg Tabs (Hydrocodone-acetaminophen) .... Take 1 tab by mouth two times a day  as needed 3)  Benazepril Hcl 10 Mg Tabs (Benazepril hcl) .... Take 1 tablet by mouth once a day 4)  Doxycycline Hyclate 100 Mg Caps (Doxycycline hyclate) .... Take 1 tab two times a day x 10 days  Patient Instructions: 1)  Please schedule a follow-up appointment in 3 months. 2)  I have referred you to Dr Eduard Clos to discuss other pain mgmt options. 3)  I have refilled your hydrocodone. 4)  I have prescribed an antibiotic for you. Prescriptions: VICODIN HP 10-660 MG TABS (HYDROCODONE-ACETAMINOPHEN) Take 1 tab by mouth two times a day  as needed  #60 x 0   Entered by:   Adella Hare LPN   Authorized by:   Esperanza Sheets PA   Signed by:   Adella Hare LPN on 93/23/5573   Method used:   Printed then faxed to ...       [facility]  [address] [phone]* (retail)       [address]       [place], [place]  [address]       Ph: [phone] or [phone]       Fax: [phone]   RxID:   [id number] VICODIN HP 10-660 MG TABS (HYDROCODONE-ACETAMINOPHEN) Take 1 tab by mouth two times a day  as needed  #60 x 0   Entered and Authorized by:   Esperanza Sheets PA   Signed by:   Esperanza Sheets PA on 10/23/2009   Method used:   Printed then faxed to ...       [facility]  [address] [phone]* (retail)       [address]       [place], [place]  [address]       Ph: [phone] or [phone]       Fax: [phone]   RxID:   [id number] DOXYCYCLINE HYCLATE 100 MG CAPS (DOXYCYCLINE HYCLATE) take 1 tab two times a day x 10 days  #20 x 0   Entered and Authorized by:   Esperanza Sheets PA   Signed by:   Esperanza Sheets PA on 10/23/2009   Method  used:   Electronically to        [facility]. [phone]* (retail)       [address]       [place], [place]  [address]       Ph: [phone] or [phone]  Fax: [phone]   RxID:   [id number] VICODIN HP 10-660 MG TABS (HYDROCODONE-ACETAMINOPHEN) Take 1 tab by mouth at bedtime as needed  #60 x 0   Entered and Authorized by:   Esperanza Sheets PA   Signed by:   Esperanza Sheets PA on 10/23/2009   Method used:   Printed then faxed to ...       [facility]  [address] [phone]* (retail)       [address]       [place], [place]  [address]       Ph: [phone] or [phone]       Fax: [phone]   RxID:   [id number]  Only one Vicoden HP prescription faxed to pharmacy.  Printing error, so prescription was sent to printer multiple times.

## 2010-02-28 NOTE — Progress Notes (Signed)
Summary: side hurting  Phone Note Call from Patient   Summary of Call: side is still hurting. feels like something is pumping in there.  in pain. please call back [phone] cell [phone] Initial call taken by: Rudene Anda,  December 08, 2008 9:37 AM  Follow-up for Phone Call        discussed with pt the fact that all tests are negative which isreassuring, he states the pain is better but not yet gone, no furthwer testing needed at this time, advised ed if symptoms worsen Follow-up by: Syliva Overman MD,  December 08, 2008 2:53 PM

## 2010-02-28 NOTE — Progress Notes (Signed)
Summary: requesting what to take for headache  Phone Note Call from Patient   Summary of Call: patient called in and states he was hurt last week at work, and they took him to the ER where he was told he had a concusion.  He states that he has a headache and wanted to know what he could take for it.  I advised him he would need to discuss with his workers comp. doctor and he said he didn't file it on his workers comp.  I asked Asher Muir what could he take and she advised him to go to ER or Urgent Care. Initial call taken by: Curtis Sites,  January 30, 2010 4:46 PM  Follow-up for Phone Call        noted Follow-up by: Adella Hare LPN,  January 30, 2010 4:49 PM

## 2010-02-28 NOTE — Progress Notes (Signed)
Summary: CALL  Phone Note Call from Patient   Summary of Call: NEEDS ANT HAS THE SAME THING BOIL [facility] [place]  CALL BACK AT [phone] Initial call taken by: Lind Guest,  Jun 12, 2008 3:54 PM  Follow-up for Phone Call        if the boill is in the same place he needs a surgeon to see this, refer to Dr. Malvin Johns if he does not want to go back to Dr. Lovell Sheehan pls Follow-up by: Syliva Overman MD,  Jun 12, 2008 5:45 PM  Additional Follow-up for Phone Call Additional follow up Details #1::        pt has appt at dr, Lovell Sheehan office for 06/13/2008 2:15. pt notified  Additional Follow-up by: Rudene Anda,  Jun 13, 2008 9:55 AM

## 2010-02-28 NOTE — Assessment & Plan Note (Signed)
Summary: ov   Vital Signs:  Patient profile:   45 year old male Height:      71 inches (180.34 cm) Weight:      184 pounds (83.64 kg) BMI:     25.76 BSA:     2.04 O2 Sat:      97 % Pulse rate:   66 / minute Pulse rhythm:   regular Resp:     16 per minute BP sitting:   124 / 90  Vitals Entered By: Everitt Amber (April 13, 2008 9:15 AM)  Nutrition Counseling: Patient's BMI is greater than 25 and therefore counseled on weight management options. CC: knots came up under right arm, noticed them saturday morning. Getting bigger and more painful   CC:  knots came up under right arm and noticed them saturday morning. Getting bigger and more painful.  History of Present Illness: 6 day h/o tender swelling unr right arm, unable to sleep last night, progressively worsening.the ara is red, he is unable to adequately move his arm because of pain.he has no known trauma to the area. He denies any fever but has had chills.    Preventive Screening-Counseling & Management     Smoking Status: never  Allergies (verified): No Known Drug Allergies  Review of Systems General:  See HPI. CV:  Denies chest pain or discomfort, palpitations, and swelling of feet. Resp:  Denies cough, sputum productive, and wheezing. GI:  Denies abdominal pain, constipation, diarrhea, nausea, and vomiting. GU:  Denies dysuria. MS:  See HPI; Complains of joint pain. Derm:  See HPI; Complains of lesion(s).  Physical Exam  General:  alert, well-nourished, and well-hydrated. in pain. HEENT: No facial asymmetry,  EOMI, No sinus tenderness, TM's Clear, oropharynx  pink and moist.   Chest: Clear to auscultation bilaterally.  CVS: S1, S2, No murmurs, No S3.   Abd: Soft, Nontender.  MS: Adequate ROM spine, hips, shoulders and knees.  Ext: No edema.   CNS: CN 2-12 intact, power tone and sensation normal throughout.   Skin: abcess under right axilla which appears to extend deep to the chest wall .  Psych: Good eye  contact, normal affect.  Memory intact, not anxious or depressed appearing.     Impression & Recommendations:  Problem # 1:  ABSCESS, AXILLA, RIGHT (ICD-682.3) Assessment Comment Only  The following medications were removed from the medication list:    Septra Ds 800-160 Mg Tabs (Sulfamethoxazole-trimethoprim) ..... One tab by mouth bid His updated medication list for this problem includes:    Doxycycline Hyclate 100 Mg Cpep (Doxycycline hyclate) .Marland Kitchen... Take 1 tablet by mouth two times a day surgical referral Orders: Ketorolac-Toradol 15mg  (Z6109) Rocephin  250mg  (U0454) Admin of Therapeutic Inj  intramuscular or subcutaneous (09811) Surgical Referral (Surgery)  Problem # 2:  HYPERTENSION (ICD-401.9)  Orders: Surgical Referral (Surgery)  Complete Medication List: 1)  Cialis 20 Mg Tabs (Tadalafil) .... One tab by mouth once daily prn 2)  Doxycycline Hyclate 100 Mg Cpep (Doxycycline hyclate) .... Take 1 tablet by mouth two times a day 3)  Vicodin Hp 10-660 Mg Tabs (Hydrocodone-acetaminophen) .... Take 1 tablet by mouth three times a day as needed 4)  Duragesic-25 25 Mcg/hr Pt72 (Fentanyl) .... One patch every 72 hours  Other Orders: Tdap => 55yrs IM (91478) Admin 1st Vaccine (29562)  Patient Instructions: 1)  Please schedule a follow-up appointment in6 weeks. 2)  You are getting injection in the office for pain and also a Rocephin injection. You will be refwerred  to a Careers adviser today. a prescription for antibiotics and pain meds is being sent to your pharmacy. 3)  You will get a tetanus injection also Prescriptions: DURAGESIC-25 25 MCG/HR PT72 (FENTANYL) one patch every 72 hours  #10 x 0   Entered and Authorized by:   Syliva Overman MD   Signed by:   Syliva Overman MD on 04/18/2008   Method used:   Handwritten   RxID:   [id number] VICODIN HP 10-660 MG TABS (HYDROCODONE-ACETAMINOPHEN) Take 1 tablet by mouth three times a day as needed  #42 x 0   Entered and Authorized  by:   Syliva Overman MD   Signed by:   Syliva Overman MD on 04/13/2008   Method used:   Printed then faxed to ...       [facility]  [address] [phone]* (retail)       [address]       [place], [place]  [address]       Ph: [phone] or [phone]       Fax: [phone]   RxID:   [id number] DOXYCYCLINE HYCLATE 100 MG CPEP (DOXYCYCLINE HYCLATE) Take 1 tablet by mouth two times a day  #20 x 0   Entered and Authorized by:   Syliva Overman MD   Signed by:   Syliva Overman MD on 04/13/2008   Method used:   Electronically to        [facility] [phone]* (retail)       [address]       [place], [place]  [address]       Ph: [phone] or [phone]       Fax: [phone]   RxID:   [id number]      Medication Administration  Injection # 1:    Medication: Ketorolac-Toradol 15mg     Diagnosis: ABSCESS, AXILLA, RIGHT (ICD-682.3)    Route: IM    Site: LUOQ gluteus    Exp Date: 10/11    Lot #: [id number]    Mfr: [employer]s    Comments: toradol 60mg  given    Patient tolerated injection without complications    Given by: Worthy Keeler LPN (April 13, 2008 10:36 AM)  Injection # 2:    Medication: Rocephin  250mg     Diagnosis: ABSCESS, AXILLA, RIGHT (ICD-682.3)    Route: IM    Site: RUOQ gluteus    Exp Date: 9/12    Lot #: [id number]    Mfr: novaplus    Comments: rocephin 500mg  given    Patient tolerated injection without complications    Given by: Worthy Keeler LPN (April 13, 2008 10:36 AM)  Orders Added: 1)  Ketorolac-Toradol 15mg  [J1885] 2)  Rocephin  250mg  [J0696] 3)  Admin of Therapeutic Inj  intramuscular or subcutaneous [96372] 4)  Tdap => 109yrs IM [90715] 5)  Admin 1st Vaccine [90471] 6)  Est. Patient Level III [03500] 7)  Surgical Referral [Surgery]    Tetanus/Td Vaccine    Vaccine Type: Tdap    Site: left deltoid    Mfr: Sanofi Pasteur    Dose: 0.5 ml    Route: IM    Given by: Worthy Keeler LPN     Exp. Date: 12/29/2009    Lot #: [id number]    VIS given: 12/15/06 version given April 13, 2008.

## 2010-02-28 NOTE — Letter (Signed)
Summary: procedure  procedure   Imported By: Lind Guest 09/06/2009 14:27:25  _____________________________________________________________________  External Attachment:    Type:   Image     Comment:   External Document

## 2010-02-28 NOTE — Medication Information (Signed)
Summary: Tax adviser   Imported By: Lind Guest 04/17/2008 08:44:59  _____________________________________________________________________  External Attachment:    Type:   Image     Comment:   External Document

## 2010-02-28 NOTE — Progress Notes (Signed)
Summary: swab  Phone Note Call from Patient   Summary of Call: call  [phone] ext. [phone] about his swab Initial call taken by: Lind Guest,  September 07, 2009 3:25 PM  Follow-up for Phone Call        confirmed with lab abcess under left armpit Follow-up by: Adella Hare LPN,  September 07, 2009 3:36 PM

## 2010-02-28 NOTE — Letter (Signed)
Summary: consult  consult   Imported By: Curtis Sites 07/18/2009 11:57:23  _____________________________________________________________________  External Attachment:    Type:   Image     Comment:   External Document

## 2010-02-28 NOTE — Letter (Signed)
Summary: xray  xray   Imported By: Curtis Sites 07/18/2009 11:59:21  _____________________________________________________________________  External Attachment:    Type:   Image     Comment:   External Document

## 2010-02-28 NOTE — Letter (Signed)
Summary: demographic  demographic   Imported By: Curtis Sites 07/18/2009 11:57:38  _____________________________________________________________________  External Attachment:    Type:   Image     Comment:   External Document

## 2010-02-28 NOTE — Letter (Signed)
Summary: lab  lab   Imported By: Curtis Sites 07/18/2009 11:58:14  _____________________________________________________________________  External Attachment:    Type:   Image     Comment:   External Document

## 2010-02-28 NOTE — Progress Notes (Signed)
Summary: medicine  Phone Note Call from Patient   Summary of Call: WANTS YOU TO SEND IN FOR HIS hydrocodone      [facility]   [facility]  like dawn did last time call him [phone] to let him know Initial call taken by: Lind Guest,  November 22, 2009 2:42 PM  Follow-up for Phone Call        pls refill x 3 , needs ov in 3 months  Follow-up by: Syliva Overman MD,  November 22, 2009 5:01 PM  Additional Follow-up for Phone Call Additional follow up Details #1::        NEEDS 60 LIKE DAWN GAVE HIM Additional Follow-up by: Lind Guest,  November 23, 2009 9:32 AM    Additional Follow-up for Phone Call Additional follow up Details #2::    one preswcription printed pls fax and let him know,  Follow-up by: Syliva Overman MD,  November 23, 2009 12:05 PM  Additional Follow-up for Phone Call Additional follow up Details #3:: Details for Additional Follow-up Action Taken: rx sent Additional Follow-up by: Adella Hare LPN,  November 23, 2009 2:46 PM  New/Updated Medications: VICODIN HP 10-660 MG TABS (HYDROCODONE-ACETAMINOPHEN) Take 1 tablet by mouth two times a day as needed Prescriptions: VICODIN HP 10-660 MG TABS (HYDROCODONE-ACETAMINOPHEN) Take 1 tablet by mouth two times a day as needed  #60 x 0   Entered and Authorized by:   Syliva Overman MD   Signed by:   Syliva Overman MD on 11/23/2009   Method used:   Printed then faxed to ...       [facility]  [address] [phone]* (retail)       [address]       [place], [place]  [address]       Ph: [phone] or [phone]       Fax: [phone]   RxID:   [id number]

## 2010-02-28 NOTE — Progress Notes (Signed)
Summary: hit in the head with an object needs ref  Phone Note Call from Patient   Summary of Call: last thursday while at work he was hit by an object in the head he had a concu. and went to [place] to be seen and the dr there recommend that he go see a neu. in [place] and he can not get in touch with them and he wants an  appt with dr. Gerilyn Pilgrim  a dr that is in [place] but he wanted the appt. for today and i called emily at [facility] and she made him an appt for 1.11.2012 @ 3:00 that was the 1st appt ava. she needs a ref. and his  info sent over there and for him to get the paper work from [place] so it can be faxed over so he knows this and he needed to see a dr today so i gave him the # to the dr at [facility] to see what he could do to help him about a note for work Initial call taken by: Lind Guest,  January 31, 2010 9:34 AM  Follow-up for Phone Call        noted and agree Follow-up by: Syliva Overman MD,  February 04, 2010 1:01 PM

## 2010-02-28 NOTE — Progress Notes (Signed)
  Phone Note Call from Patient   Caller: Patient Summary of Call: patient in a lot of pain, states dr Lovell Sheehan did not do anything or prescribe anything will reeval next week in his office, pain med dr Lodema Hong wrote not strong enough Initial call taken by: Worthy Keeler LPN,  April 14, 2008 11:56 AM  Follow-up for Phone Call        rx written for fentanyl patch, pls advise pt Follow-up by: Syliva Overman MD,  April 14, 2008 12:13 PM  Additional Follow-up for Phone Call Additional follow up Details #1::        patient aware, will have son to pick up script for him Additional Follow-up by: Worthy Keeler LPN,  April 14, 2008 1:10 PM

## 2010-02-28 NOTE — Progress Notes (Signed)
  Phone Note Call from Patient   Caller: Patient Summary of Call: patient wants refill on pain med just like last month call cell when sent in Initial call taken by: Adella Hare LPN,  December 24, 2009 8:08 AM  Follow-up for Phone Call        pls refill x2 let him know he needs ov in january  Follow-up by: Syliva Overman MD,  December 24, 2009 12:31 PM    Prescriptions: VICODIN HP 10-660 MG TABS (HYDROCODONE-ACETAMINOPHEN) Take 1 tablet by mouth two times a day as needed  #60 x 1   Entered by:   Adella Hare LPN   Authorized by:   Syliva Overman MD   Signed by:   Adella Hare LPN on 16/10/9602   Method used:   Printed then faxed to ...       [facility]  [address] [phone]* (retail)       [address]       [place], [place]  [address]       Ph: [phone] or [phone]       Fax: [phone]   RxID:   [id number]   Appended Document:  patient aware

## 2010-02-28 NOTE — Letter (Signed)
Summary: history and physical  history and physical   Imported By: Curtis Sites 07/18/2009 11:57:58  _____________________________________________________________________  External Attachment:    Type:   Image     Comment:   External Document

## 2010-02-28 NOTE — Letter (Signed)
Summary: phone  phone   Imported By: Curtis Sites 07/18/2009 11:58:50  _____________________________________________________________________  External Attachment:    Type:   Image     Comment:   External Document

## 2010-02-28 NOTE — Letter (Signed)
Summary: [facility]  [facility]   Imported By: Lind Guest 02/11/2010 10:40:46  _____________________________________________________________________  External Attachment:    Type:   Image     Comment:   External Document

## 2010-02-28 NOTE — Progress Notes (Signed)
  Phone Note Call from Patient   Summary of Call: messed back up doing yardwork Monday. Hurting in his lower back. States he has bulging discs and has been taking Ibuprofen since Monday but not helping. Wants to know if he can have hydrocodone like you normally give when he has flare ups   [facility] [place] Initial call taken by: Everitt Amber LPN,  May 17, 2009 2:16 PM  Follow-up for Phone Call        send in same dose for 10 days only and lethim know pls Follow-up by: Syliva Overman MD,  May 17, 2009 2:38 PM  Additional Follow-up for Phone Call Additional follow up Details #1::        Pt told me he may not be home when I called back so I left messgae to check with his pharmacy Additional Follow-up by: Everitt Amber LPN,  May 17, 2009 2:42 PM    Prescriptions: VICODIN HP 10-660 MG TABS (HYDROCODONE-ACETAMINOPHEN) Take 1 tablet by mouth two times a day as needed  #20 x 0   Entered by:   Everitt Amber LPN   Authorized by:   Syliva Overman MD   Signed by:   Everitt Amber LPN on 21/30/8657   Method used:   Printed then faxed to ...       [facility]  [address] [phone]* (retail)       [address]       [place], [place]  [address]       Ph: [phone] or [phone]       Fax: [phone]   RxID:   [id number]

## 2010-02-28 NOTE — Progress Notes (Signed)
  Phone Note Call from Patient   Caller: Patient Summary of Call: patient says he is working a really hard job, and back is killing him, states he is working through the pain and  he has been taking alot of ibuprofen but doesnt seem to be helping, can you call somthing in? patient is working in [employer] [phone] Initial call taken by: Adella Hare LPN,  August 14, 2009 9:11 AM  Follow-up for Phone Call        he can get vicodin for 30 tabs , ie 1 at night only , needs to come in for visit before this can be refilled, let him know Follow-up by: Syliva Overman MD,  August 14, 2009 12:10 PM  Additional Follow-up for Phone Call Additional follow up Details #1::        left detailed voicemail Additional Follow-up by: Adella Hare LPN,  August 14, 2009 2:41 PM    New/Updated Medications: VICODIN HP 10-660 MG TABS (HYDROCODONE-ACETAMINOPHEN) Take 1 tab by mouth at bedtime as needed Prescriptions: VICODIN HP 10-660 MG TABS (HYDROCODONE-ACETAMINOPHEN) Take 1 tab by mouth at bedtime as needed  #30 x 0   Entered and Authorized by:   Syliva Overman MD   Signed by:   Syliva Overman MD on 08/14/2009   Method used:   Printed then faxed to ...       [facility]  [address] [phone]* (retail)       [address]       [place], [place]  [address]       Ph: [phone] or [phone]       Fax: [phone]   RxID:   [id number]

## 2010-02-28 NOTE — Assessment & Plan Note (Signed)
Summary: OV   Vital Signs:  Patient profile:   45 year old male Height:      71 inches Weight:      195.75 pounds BMI:     27.40 O2 Sat:      98 % Pulse rate:   83 / minute Pulse rhythm:   regular Resp:     16 per minute BP sitting:   130 / 94  Vitals Entered By: Everitt Amber (December 05, 2008 1:12 PM)  Nutrition Counseling: Patient's BMI is greater than 25 and therefore counseled on weight management options. CC: started saturday, pain under right ribcage, not sore to the touch but hurts really bad, feels like a bad cramp. The pulled muscle he had last month never healed and he didn't do anything to aggravate it so he wonders if it was even a pulled muscle then Is Patient Diabetic? No Pain Assessment Patient in pain? yes     Location: right side pain Intensity: 10 Type: sharp Onset of pain  saturday   CC:  started saturday, pain under right ribcage, not sore to the touch but hurts really bad, and feels like a bad cramp. The pulled muscle he had last month never healed and he didn't do anything to aggravate it so he wonders if it was even a pulled muscle then.  History of Present Illness: 4 day h/o right upper quadrant pain  constant greater than 10, sneezing aggravates the pain, food does not aggravate the pain, but he has been not eating much since onset of pain pain is maximally on the right side in the axillary line, it radiates mainly to the back, no relieving factor, keeps him up at night. no kidney stone or gallstone history.   Current Medications (verified): 1)  Ibuprofen 800 Mg Tabs (Ibuprofen) .... One Tab By Mouth Tid 2)  Valium 5 Mg Tabs (Diazepam) .... One Tab By Mouth At Bedtime Prn 3)  Vicodin Hp 10-660 Mg Tabs (Hydrocodone-Acetaminophen) .... One Tab By Mouth At Bedtime Prn  Allergies (verified): No Known Drug Allergies  Review of Systems      See HPI General:  Complains of fatigue and sleep disorder; denies chills and fever. ENT:  Complains of nasal  congestion; denies earache, hoarseness, and sinus pressure. CV:  Denies chest pain or discomfort, palpitations, and swelling of feet. Resp:  Denies cough and wheezing. GI:  See HPI; Complains of abdominal pain; denies constipation, diarrhea, nausea, and vomiting blood; genaraliseed abdominal soreness yesterday as if he had been  doing pushups. GU:  Complains of urinary frequency; denies dysuria and hematuria; inlight of flank pain which is radiating, though not in the classic nature of loin to groin CCUA will be checked, particularly for blood to r/o stone. MS:  Complains of mid back pain; 4 days. Derm:  Complains of lesion(s); skin tag under left axilla. Neuro:  Denies headaches. Psych:  Complains of depression; denies panic attacks, sense of great danger, suicidal thoughts/plans, thoughts of violence, and unusual visions or sounds; pt being seen by psch for pTSD, was on zoloft and has stopped this, he was in the [employer]. Endo:  Denies cold intolerance, excessive hunger, excessive thirst, excessive urination, heat intolerance, polyuria, and weight change. Heme:  Denies abnormal bruising and bleeding. Allergy:  Complains of seasonal allergies; chronic allergies, xyzal.  Physical Exam  General:  alert, well-nourished, and well-hydrated. pt in pain HEENT: No facial asymmetry,  EOMI, No sinus tenderness, TM's Clear, oropharynx  pink and moist.  Chest: Clear to auscultation bilaterally.  CVS: S1, S2, No murmurs, No S3.   Abd: No rebound tenderness or guarding, tender over RUQ and epigastrium, BS normal MS: decreased ROM spinewith palpable spasm, adequate ROM  hips, shoulders and knees.  Ext: No edema.   CNS: CN 2-12 intact, power tone and sensation normal throughout.   Skin: Intact, no visible lesions or rashes.  Psych: Good eye contact, normal affect.  Memory intact, not anxious or depressed appearing.     Impression & Recommendations:  Problem # 1:  ALLERGIC RHINITIS  (ICD-477.9) Assessment Comment Only  His updated medication list for this problem includes:    Xyzal 5 Mg Tabs (Levocetirizine dihydrochloride) .Marland Kitchen... Take 1 tablet by mouth once a day  Problem # 2:  BACK PAIN, ACUTE (ICD-724.5) Assessment: Deteriorated  The following medications were removed from the medication list:    Vicodin Hp 10-660 Mg Tabs (Hydrocodone-acetaminophen) ..... One tab by mouth at bedtime prn His updated medication list for this problem includes:    Ibuprofen 800 Mg Tabs (Ibuprofen) ..... One tab by mouth tid    Vicodin Hp 10-660 Mg Tabs (Hydrocodone-acetaminophen) .Marland Kitchen... Take 1 tablet by mouth two times a day as needed  Future Orders: Radiology Referral (Radiology) ... 12/06/2008  Problem # 3:  ABDOMINAL PAIN, GENERALIZED (ICD-789.07) Assessment: Deteriorated  Orders: T-Lipase ([id number]) T-Amylase ([id number]) Radiology Referral (Radiology) Ketorolac-Toradol 15mg  [phone]) Admin of Therapeutic Inj  intramuscular or subcutaneous (10272)  Problem # 4:  ESSENTIAL HYPERTENSION (ICD-401.9) Assessment: Deteriorated  The following medications were removed from the medication list:    Hydrochlorothiazide 25 Mg Tabs (Hydrochlorothiazide) ..... One tab by mouth qd  Orders: T-Basic Metabolic Panel [phone])  BP today: 130/94pt non compliant with meds, importance of same discussed Prior BP: 120/98 (09/25/2008)  Labs Reviewed: K+: 4.7 (11/05/2007) Creat: : 1.16 (11/05/2007)   Chol: 154 (11/05/2007)   HDL: 50 (11/05/2007)   LDL: 94 (11/05/2007)   TG: 50 (11/05/2007)  Problem # 5:  ACUTE CYSTITIS (ICD-595.0) Assessment: Comment Only  Orders: UA Dipstick W/ Micro (manual) (42595), normal pt reassure  Encouraged to push clear liquids, get enough rest, and take acetaminophen as needed. To be seen in 10 days if no improvement, sooner if worse.  Complete Medication List: 1)  Ibuprofen 800 Mg Tabs (Ibuprofen) .... One tab by mouth tid 2)  Valium 5 Mg Tabs  (Diazepam) .... One tab by mouth at bedtime prn 3)  Xyzal 5 Mg Tabs (Levocetirizine dihydrochloride) .... Take 1 tablet by mouth once a day 4)  Vicodin Hp 10-660 Mg Tabs (Hydrocodone-acetaminophen) .... Take 1 tablet by mouth two times a day as needed  Other Orders: T-Hepatic Function [id number]) T-PSA ([id number]) CXR- 2view (CXR) CXR- 2view (CXR)  Patient Instructions: 1)  f/u in 6 weeks. 2)  you need labs today and  some studies .  3)  meds are sent in as discussed Prescriptions: VALIUM 5 MG TABS (DIAZEPAM) one tab by mouth at bedtime prn  #20 x 2   Entered by:   Everitt Amber   Authorized by:   Syliva Overman MD   Signed by:   Everitt Amber on 12/05/2008   Method used:   Printed then [facility]ed [facility] ...       [facility]  [address] [phone]* (retail)       [address]       [place], [place]  [address]       Ph: [phone] or [phone]  Fax: [phone]   RxID:   [id number] IBUPROFEN 800 MG TABS (IBUPROFEN) one tab by mouth tid  #30 x 2   Entered by:   Everitt Amber   Authorized by:   Syliva Overman MD   Signed by:   Everitt Amber on 12/05/2008   Method used:   Printed then faxed to ...       [facility]  [address] [phone]* (retail)       [address]       [place], [place]  [address]       Ph: [phone] or [phone]       Fax: [phone]   RxID:   [id number] VICODIN HP 10-660 MG TABS (HYDROCODONE-ACETAMINOPHEN) Take 1 tablet by mouth two times a day as needed  #60 x 1   Entered by:   Everitt Amber   Authorized by:   Syliva Overman MD   Signed by:   Everitt Amber on 12/05/2008   Method used:   Printed then faxed to ...       [facility]  [address] [phone]* (retail)       [address]       [place], [place]  [address]       Ph: [phone] or [phone]       Fax: [phone]   RxID:   [id number] XYZAL 5 MG TABS (LEVOCETIRIZINE DIHYDROCHLORIDE) Take 1 tablet by mouth once a day  #30 x 3   Entered by:   Everitt Amber    Authorized by:   Syliva Overman MD   Signed by:   Everitt Amber on 12/05/2008   Method used:   [facility]        [facility] [address] [phone]* (retail)       [address]       [place], [place]  [address]       Ph: [phone] or [phone]       Fax: [phone]   RxID:   [id number]    Medication Administration  Injection # 1:    Medication: Ketorolac-Toradol 15mg     Diagnosis: ABDOMINAL PAIN, GENERALIZED (ICD-789.07)    Route: IM    Site: RUOQ gluteus    Exp Date: 02/2010    Lot #: [id number]    Mfr: [email or web address]    Comments: 60 mg given    Patient tolerated injection without complications    Given by: Everitt Amber (December 05, 2008 2:26 PM)  Orders Added: 1)  Est. Patient Level IV [99214] 2)  T-Basic Metabolic Panel [80048-22910] 3)  T-Lipase [83690-23215] 4)  T-Amylase [82150-23210] 5)  T-Hepatic Function [80076-22960] 6)  T-PSA [15400-86761] 7)  Radiology Referral [Radiology] 8)  Radiology Referral [Radiology] 9)  Ketorolac-Toradol 15mg  [J1885] 10)  Admin of Therapeutic Inj  intramuscular or subcutaneous [96372] 11)  UA Dipstick W/ Micro (manual) [81000] 12)  CXR- 2view [CXR] 13)  CXR- 2view [CXR] 14)  Radiology Referral [Radiology]   Laboratory Results   Urine Tests    Routine Urinalysis   Color: yellow Appearance: Clear Glucose: negative   (Normal Range: Negative) Bilirubin: negative   (Normal Range: Negative) Ketone: negative   (Normal Range: Negative) Spec. Gravity: 1.020   (Normal Range: 1.003-1.035) Blood: negative   (Normal Range: Negative) pH: 6.5   (Normal Range: 5.0-8.0) Protein: negative   (Normal Range: Negative) Urobilinogen: 0.2   (Normal Range: 0-1)  Nitrite: negative   (Normal Range: Negative) Leukocyte Esterace: negative   (Normal Range: Negative)     Blood Tests   Date/Time Received: December 05, 2008  Date/Time Reported: December 05, 2008

## 2010-02-28 NOTE — Progress Notes (Signed)
Summary: refill  Phone Note Call from Patient   Summary of Call: pt needs to get rx for hydrocodone. wants to get a 60 day supply instead of 30day. he has to take two aday. [phone] Initial call taken by: Rudene Anda,  October 19, 2009 1:09 PM  Follow-up for Phone Call        pls advise dose cannot be changed withoput oV and refill at current dose x 1 if he agrees 6to this , if not he will needto be re-evaluated in the office Follow-up by: Syliva Overman MD,  October 19, 2009 2:07 PM  Additional Follow-up for Phone Call Additional follow up Details #1::        patient wants appt next week Additional Follow-up by: Adella Hare LPN,  October 19, 2009 2:23 PM

## 2010-02-28 NOTE — Progress Notes (Signed)
Summary: PLACE UNDER ARM AGAIN  Phone Note Call from Patient   Summary of Call: HE IS GETTING ANOTHER PLACE UNDER ARM  DOES HE NEED ANOTHER ANTI. HE HAS PAIN PILLS    [facility] [phone]  TO LET HIM KNOW Initial call taken by: Lind Guest,  May 02, 2008 8:49 AM  Follow-up for Phone Call        pt states the infection was draining for some time aappeared to heal then recurred quickly. I advised him to f/u with Dr .Lovell Sheehan since I do not believe the med was able to address the prob in its entyirety. i did advise that abx will be sent in.    Follow-up by: Syliva Overman MD,  May 02, 2008 12:17 PM  Additional Follow-up for Phone Call Additional follow up Details #1::        pls erx doxycycline 100mg  Take 1 tablet by mouth two times a day #20 only as requested he is already aware Additional Follow-up by: Syliva Overman MD,  May 02, 2008 12:19 PM    New/Updated Medications: DOXYCYCLINE HYCLATE 100 MG CAPS (DOXYCYCLINE HYCLATE) one cap by mouth bid   Prescriptions: DOXYCYCLINE HYCLATE 100 MG CAPS (DOXYCYCLINE HYCLATE) one cap by mouth bid  #20 x 0   Entered by:   Worthy Keeler LPN   Authorized by:   Syliva Overman MD   Signed by:   Worthy Keeler LPN on 11/91/4782   Method used:   [facility]        [facility] [address] [phone]* (retail)       [address]       [place], [place]  [address]       Ph: [phone] or [phone]       Fax: [phone]   RxID:   [id number]

## 2010-02-28 NOTE — Progress Notes (Signed)
Summary: DOING BETTER  Phone Note Call from Patient   Summary of Call: WANTED TO LET YOU KNOW THAT THE SWELLING HAS GONE JUST ONE LITTLE RED SPOT  AND HE CANCELLED HIS APPT. WITH DR. Lovell Sheehan DOING ALOT BETTER Initial call taken by: Lind Guest,  April 18, 2008 8:53 AM

## 2010-02-28 NOTE — Assessment & Plan Note (Signed)
Summary: OV   Vital Signs:  Patient profile:   45 year old male Height:      71 inches Weight:      194.25 pounds BMI:     27.19 O2 Sat:      97 % Pulse rate:   76 / minute Pulse rhythm:   regular Resp:     16 per minute BP sitting:   150 / 100  (left arm) Cuff size:   large  Vitals Entered By: Everitt Amber LPN (September 05, 2009 3:57 PM)  Nutrition Counseling: Patient's BMI is greater than 25 and therefore counseled on weight management options. CC: Follow up chronic problems, back pain that has worsened and he has a boil under his left arm   CC:  Follow up chronic problems and back pain that has worsened and he has a boil under his left arm.  History of Present Illness: pt presents with a 3 day h/o pinful boil ubder left armpit, he has had chills but no documented fever. He has had a similar prob in his right axilla on more than one occasion. He does not shave and has nop receall of diresct trauma to the area. He c/o inc back pain now that he is back at work, he denies lower ext symptoms  Current Medications (verified): 1)  Ibuprofen 800 Mg Tabs (Ibuprofen) .... One Tab By Mouth Tid 2)  Vicodin Hp 10-660 Mg Tabs (Hydrocodone-Acetaminophen) .... Take 1 Tab By Mouth At Bedtime As Needed  Allergies (verified): 1)  ! Sulfa  Review of Systems      See HPI General:  Complains of chills; denies fatigue, fever, loss of appetite, and malaise. Eyes:  Denies blurring and discharge. ENT:  Denies hoarseness, postnasal drainage, and sinus pressure. CV:  Denies chest pain or discomfort, palpitations, shortness of breath with exertion, and swelling of feet. Resp:  Denies cough and sputum productive. GI:  Denies abdominal pain, constipation, diarrhea, nausea, and vomiting. GU:  Denies erectile dysfunction, urinary frequency, and urinary hesitancy. MS:  Complains of low back pain and mid back pain; increased back pain and now his job involves bending, climbing , lifting , wants regular  access to pain med for use at night as needed. Derm:  Complains of lesion(s); painful boil under left armpit x 3 days. Neuro:  Denies headaches and seizures. Psych:  Denies anxiety and easily angered. Endo:  Denies cold intolerance and excessive hunger. Heme:  Denies abnormal bruising and bleeding. Allergy:  Complains of seasonal allergies.  Physical Exam  General:  Well-developed,well-nourished,in no acute distress; alert,appropriate and cooperative throughout examinationPt in pain. HEENT: No facial asymmetry,  EOMI, No sinus tenderness, TM's Clear, oropharynx  pink and moist.   Chest: Clear to auscultation bilaterally.  CVS: S1, S2, No murmurs, No S3.   Abd: Soft, Nontender.  MS: decrwased ROM spine,adequate in  hips, shoulders and knees.  Ext: No edema.   CNS: CN 2-12 intact, power tone and sensation normal throughout.   Skin:infected cyst in left axilla Psych: Good eye contact, normal affect.  Memory intact, not anxious or depressed appearing.    Impression & Recommendations:  Problem # 1:  ABSCESS (ICD-682.9) Assessment Comment Only  The following medications were removed from the medication list:    Septra Ds 800-160 Mg Tabs (Sulfamethoxazole-trimethoprim) ..... One tab by mouth two times a day His updated medication list for this problem includes:    Doxycycline Hyclate 100 Mg Caps (Doxycycline hyclate) .Marland Kitchen... Take 1 capsule by mouth  two times a day, return in am for I/D byPA  Orders: Rocephin  250mg  (Z6109) Depo- Medrol 80mg  (J1040) Admin of Therapeutic Inj  intramuscular or subcutaneous (60454)  Problem # 2:  BACK PAIN (ICD-724.5) Assessment: Deteriorated  His updated medication list for this problem includes:    Ibuprofen 800 Mg Tabs (Ibuprofen) ..... One tab by mouth tid    Vicodin Hp 10-660 Mg Tabs (Hydrocodone-acetaminophen) .Marland Kitchen... Take 1 tab by mouth at bedtime as needed  Problem # 3:  ESSENTIAL HYPERTENSION (ICD-401.9) Assessment: Deteriorated  His  updated medication list for this problem includes:    Benazepril Hcl 10 Mg Tabs (Benazepril hcl) .Marland Kitchen... Take 1 tablet by mouth once a day  BP today: 150/100 Prior BP: 130/94 (12/05/2008)  Labs Reviewed: K+: 4.3 (12/06/2008) Creat: : 0.99 (12/06/2008)   Chol: 154 (11/05/2007)   HDL: 50 (11/05/2007)   LDL: 94 (11/05/2007)   TG: 50 (11/05/2007)  Complete Medication List: 1)  Ibuprofen 800 Mg Tabs (Ibuprofen) .... One tab by mouth tid 2)  Vicodin Hp 10-660 Mg Tabs (Hydrocodone-acetaminophen) .... Take 1 tab by mouth at bedtime as needed 3)  Doxycycline Hyclate 100 Mg Caps (Doxycycline hyclate) .... Take 1 capsule by mouth two times a day 4)  Benazepril Hcl 10 Mg Tabs (Benazepril hcl) .... Take 1 tablet by mouth once a day  Patient Instructions: 1)  appt in am with pA in am for I/D of abcess  2)  f/u in 2 months 3)  You will  get an injection for ionfection in the left armpit and also for back pain. meds are sent in for infection and for blood pressure. 4)  bP today 130/100 Prescriptions: VICODIN HP 10-660 MG TABS (HYDROCODONE-ACETAMINOPHEN) Take 1 tab by mouth at bedtime as needed  #30 x 1   Entered by:   Adella Hare LPN   Authorized by:   Syliva Overman MD   Signed by:   Adella Hare LPN on 09/81/1914   Method used:   Printed then faxed to ...       [facility]  [address] [phone]* (retail)       [address]       [place], [place]  [address]       Ph: [phone] or [phone]       Fax: [phone]   RxID:   [id number] IBUPROFEN 800 MG TABS (IBUPROFEN) one tab by mouth tid  #30 x 1   Entered by:   Adella Hare LPN   Authorized by:   Syliva Overman MD   Signed by:   Adella Hare LPN on 56/43/3295   Method used:   Printed then faxed to ...       [facility]  [address] [phone]* (retail)       [address]       [place], [place]  [address]       Ph: [phone] or [phone]       Fax: [phone]   RxID:   [id number] BENAZEPRIL HCL 10 MG TABS  (BENAZEPRIL HCL) Take 1 tablet by mouth once a day  #30 x 2   Entered and Authorized by:   Syliva Overman MD   Signed by:   Syliva Overman MD on 09/05/2009   Method used:   Electronically to        [facility] [phone]* (retail)       [address]  [place], [place]  [address]       Ph: [phone] or [phone]       Fax: [phone]   RxID:   [id number] DOXYCYCLINE HYCLATE 100 MG CAPS (DOXYCYCLINE HYCLATE) Take 1 capsule by mouth two times a day  #20 x 0   Entered and Authorized by:   Syliva Overman MD   Signed by:   Syliva Overman MD on 09/05/2009   Method used:   Electronically to        [facility]. [phone]* (retail)       [address]       [place], [place]  [address]       Ph: [phone] or [phone]       Fax: [phone]   RxID:   [id number]      Medication Administration  Injection # 1:    Medication: Rocephin  250mg     Diagnosis: ABSCESS (ICD-682.9)    Route: IM    Site: R deltoid    Exp Date: 1/13    Lot #: [id number]    Mfr: [email or web address]    Comments: rocephin 500mg  given    Patient tolerated injection without complications    Given by: Adella Hare LPN (September 05, 2009 4:38 PM)  Injection # 2:    Medication: Depo- Medrol 80mg     Diagnosis: ABSCESS (ICD-682.9)    Route: IM    Site: L deltoid    Exp Date: 4/12    Lot #: [id number]    Mfr: Pharmacia    Patient tolerated injection without complications    Given by: Adella Hare LPN (September 05, 2009 4:38 PM)  Orders Added: 1)  Est. Patient Level IV [70623] 2)  Rocephin  250mg  [J0696] 3)  Depo- Medrol 80mg  [J1040] 4)  Admin of Therapeutic Inj  intramuscular or subcutaneous [96[address]]  scripts for vicodin and ibuprofen changed to 60 day supp with no refills telephoned Adella Hare LPN  September 06, 2009 9:21 AM

## 2010-02-28 NOTE — Progress Notes (Signed)
  Phone Note Other Incoming   Call placed by: spouse Details for Reason: flu symptoms Summary of Call: chills, body aches, sore throat and fever for 2 days thinks he has the flu like his wife Initial call taken by: Syliva Overman MD,  November 19, 2007 8:43 AM  Follow-up for Phone Call        will call in tamiflu #10 , rest, fluids, tylenol advised Follow-up by: Syliva Overman MD,  November 19, 2007 8:44 AM    New/Updated Medications: TAMIFLU 75 MG CAPS (OSELTAMIVIR PHOSPHATE) Take 1 capsule by mouth two times a day   Prescriptions: TAMIFLU 75 MG CAPS (OSELTAMIVIR PHOSPHATE) Take 1 capsule by mouth two times a day  #10 x 0   Entered and Authorized by:   Syliva Overman MD   Signed by:   Syliva Overman MD on 11/19/2007   Method used:   Electronically to        [facility]. [phone]* (retail)       [address]       [place], [place]  [address]       Ph: [phone] or [phone]       Fax: [phone]   RxID:   [id number]
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
## 2010-02-28 NOTE — Assessment & Plan Note (Signed)
Summary: back   Vital Signs:  Patient profile:   45 year old male Height:      71 inches Weight:      202.13 pounds BMI:     28.29 O2 Sat:      99 % on Room air Pulse rate:   76 / minute Pulse rhythm:   regular Resp:     16 per minute BP sitting:   120 / 98  (left arm) Cuff size:   large  Vitals Entered By: Everitt Amber (September 25, 2008 4:21 PM)  Nutrition Counseling: Patient's BMI is greater than 25 and therefore counseled on weight management options.  O2 Flow:  Room air CC: Hurt back a week ago, yesterday he pulled something in his right side Is Patient Diabetic? No Pain Assessment Patient in pain? yes     Location: back and right side Intensity: 10 Type: sharp Onset of pain  a week ago for back and yesterday for right side   CC:  Hurt back a week ago and yesterday he pulled something in his right side.  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 Today he again hurt himself and has signifcant pain and spasm in the right flank.The pain does not radiate to the lower extremeties. He denies hematuria, and has no h/o renal colic.The current pain is aggravated by movement of back. He denies depression or anxiety and has had no recent fevr or chills.  Current Medications (verified): 1)  None  Allergies (verified): No Known Drug Allergies  Review of Systems      See HPI General:  See HPI. ENT:  Denies hoarseness, nasal congestion, nosebleeds, sinus pressure, and sore throat. CV:  Denies chest pain or discomfort, difficulty breathing while lying down, palpitations, and swelling of feet. Resp:  Denies cough and sputum productive. GI:  Denies abdominal pain, constipation, diarrhea, nausea, and vomiting. GU:  Denies dysuria and urinary frequency. MS:  See HPI. Psych:  Denies anxiety and depression.  Physical Exam  General:  alert, well-nourished, and well-hydrated.  HEENT: No facial asymmetry,    EOMI, No sinus tenderness, TM's Clear, oropharynx  pink and moist.   Chest: Clear to auscultation bilaterally.  CVS: S1, S2, No murmurs, No S3.   Abd: Soft, Nontender.  MS: decreased ROM spinewith palpable spasm, adequate ROM  hips, shoulders and knees.  Ext: No edema.   CNS: CN 2-12 intact, power tone and sensation normal throughout.   Skin: Intact, no visible lesions or rashes.  Psych: Good eye contact, normal affect.  Memory intact, not anxious or depressed appearing.     Impression & Recommendations:  Problem # 1:  ESSENTIAL HYPERTENSION (ICD-401.9) Assessment Improved  The following medications were removed from the medication list:    Hydrochlorothiazide 25 Mg Tabs (Hydrochlorothiazide) .Marland Kitchen... Take 1 tablet by mouth once a day His updated medication list for this problem includes:    Hydrochlorothiazide 25 Mg Tabs (Hydrochlorothiazide) ..... One tab by mouth qd  BP today: 120/98 Prior BP: 140/90 (06/19/2008)  Labs Reviewed: K+: 4.7 (11/05/2007) Creat: : 1.16 (11/05/2007)   Chol: 154 (11/05/2007)   HDL: 50 (11/05/2007)   LDL: 94 (11/05/2007)   TG: 50 (11/05/2007)  Problem # 2:  BACK PAIN (ICD-724.5) Assessment: Deteriorated  The following medications were removed from the medication list:    Vicodin Es 7.5-750 Mg Tabs (Hydrocodone-acetaminophen) .Marland Kitchen... Take 1 tab by mouth at bedtime as needed  His updated medication list for this problem includes:    Ibuprofen 800 Mg Tabs (Ibuprofen) ..... One tab by mouth tid    Vicodin Hp 10-660 Mg Tabs (Hydrocodone-acetaminophen) ..... One tab by mouth at bedtime prn  Orders: Depo- Medrol 80mg  (J1040) Ketorolac-Toradol 15mg  (J4782) Admin of Therapeutic Inj  intramuscular or subcutaneous (95621)  Problem # 3:  ALLERGIC RHINITIS (ICD-477.9) Assessment: Improved  The following medications were removed from the medication list:    Xyzal 5 Mg Tabs (Levocetirizine dihydrochloride) .Marland Kitchen... Take 1 tablet by mouth once a day  Complete  Medication List: 1)  Ibuprofen 800 Mg Tabs (Ibuprofen) .... One tab by mouth tid 2)  Valium 5 Mg Tabs (Diazepam) .... One tab by mouth at bedtime prn 3)  Vicodin Hp 10-660 Mg Tabs (Hydrocodone-acetaminophen) .... One tab by mouth at bedtime prn 4)  Hydrochlorothiazide 25 Mg Tabs (Hydrochlorothiazide) .... One tab by mouth qd 5)  Klor-con 20 Meq Pack (Potassium chloride) .... One tab by mouth qd  Patient Instructions: 1)  F/U ijn  4.5 months. 2)  You wil receive meds today for muscle spasm and pai meds. Prescriptions: KLOR-CON 20 MEQ PACK (POTASSIUM CHLORIDE) one tab by mouth qd  #30 x 2   Entered by:   Worthy Keeler LPN   Authorized by:   Syliva Overman MD   Signed by:   Worthy Keeler LPN on 30/86/5784   Method used:   Handwritten   RxID:   [id number] HYDROCHLOROTHIAZIDE 25 MG TABS (HYDROCHLOROTHIAZIDE) one tab by mouth qd  #30 x 2   Entered by:   Worthy Keeler LPN   Authorized by:   Syliva Overman MD   Signed by:   Worthy Keeler LPN on 02/72/5366   Method used:   Handwritten   RxID:   [id number] VICODIN HP 10-660 MG TABS (HYDROCODONE-ACETAMINOPHEN) one tab by mouth at bedtime prn  #30 x 0   Entered by:   Worthy Keeler LPN   Authorized by:   Syliva Overman MD   Signed by:   Worthy Keeler LPN on 64/33/2951   Method used:   Handwritten   RxID:   [id number] VALIUM 5 MG TABS (DIAZEPAM) one tab by mouth at bedtime prn  #20 x 0   Entered by:   Worthy Keeler LPN   Authorized by:   Syliva Overman MD   Signed by:   Worthy Keeler LPN on 32/35/5732   Method used:   Handwritten   RxID:   [id number] PREDNISONE (PAK) 5 MG TABS (PREDNISONE) uad  #21 x 0   Entered by:   Worthy Keeler LPN   Authorized by:   Syliva Overman MD   Signed by:   Worthy Keeler LPN on 15/17/6160   Method used:   Handwritten   RxID:   [id number] IBUPROFEN 800 MG TABS (IBUPROFEN) one tab by mouth tid  #30 x 0   Entered by:   Worthy Keeler LPN   Authorized by:   Syliva Overman MD    Signed by:   Worthy Keeler LPN on 03/50/0938   Method used:   Handwritten   RxID:   [id number]    Medication Administration  Injection # 1:    Medication: Depo- Medrol 80mg     Diagnosis: BACK PAIN (ICD-724.5)    Route: IM    Site: RUOQ gluteus    Exp Date: 7/12    Lot #: [id number]    Mfr: Pharmacia    Patient tolerated injection without complications    Given by: Asher Muir  Powell LPN (September 26, 2008 11:58 AM)  Injection # 2:    Medication: Ketorolac-Toradol 15mg     Diagnosis: BACK PAIN (ICD-724.5)    Route: IM    Site: LUOQ gluteus    Exp Date: 02/27/2010    Lot #: [id number]    Mfr: [email or web address]    Comments: toradol 60mg  given    Patient tolerated injection without complications    Given by: Worthy Keeler LPN (September 26, 2008 11:59 AM)  Orders Added: 1)  Est. Patient Level III [45409] 2)  Depo- Medrol 80mg  [J1040] 3)  Ketorolac-Toradol 15mg  [J1885] 4)  Admin of Therapeutic Inj  intramuscular or subcutaneous [81191]

## 2010-03-04 ENCOUNTER — Encounter: Payer: Self-pay | Admitting: Family Medicine
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
## 2010-03-14 NOTE — Assessment & Plan Note (Signed)
Summary: bp  Nurse Visit   Vital Signs:  Patient profile:   45 year old male BP sitting:   124 / 100  Vitals Entered By: Everitt Amber LPN (March 04, 2010 3:25 PM) Comments BP check, started feeling dizzy while he was out shopping    Allergies: 1)  ! Sulfa recheck 120/90 no change in his meds

## 2010-04-15 LAB — DIFFERENTIAL
Basophils Absolute: 0.1 10*3/uL (ref 0.0–0.1)
Basophils Relative: 1 % (ref 0–1)
Eosinophils Absolute: 0 10*3/uL (ref 0.0–0.7)
Eosinophils Relative: 1 % (ref 0–5)
Lymphocytes Relative: 12 % (ref 12–46)
Lymphs Abs: 1.2 10*3/uL (ref 0.7–4.0)
Monocytes Absolute: 1 10*3/uL (ref 0.1–1.0)
Monocytes Relative: 10 % (ref 3–12)
Neutro Abs: 7.7 10*3/uL (ref 1.7–7.7)
Neutrophils Relative %: 77 % (ref 43–77)

## 2010-04-15 LAB — BASIC METABOLIC PANEL
BUN: 10 mg/dL (ref 6–23)
CO2: 28 mEq/L (ref 19–32)
Calcium: 8.8 mg/dL (ref 8.4–10.5)
Chloride: 104 mEq/L (ref 96–112)
Creatinine, Ser: 1.22 mg/dL (ref 0.4–1.5)
GFR calc Af Amer: >60 mL/min (ref >60)
GFR calc non Af Amer: >60 mL/min (ref >60)
Glucose, Bld: 97 mg/dL (ref 70–99)
Potassium: 3.8 mEq/L (ref 3.5–5.1)
Sodium: 136 mEq/L (ref 135–145)

## 2010-04-15 LAB — POCT CARDIAC MARKERS
CKMB, poc: <1 ng/mL — ABNORMAL LOW (ref 1.0–8.0)
Myoglobin, poc: 83.3 ng/mL (ref 12–200)
Troponin i, poc: <0.05 ng/mL (ref 0.00–0.09)

## 2010-04-15 LAB — CBC
HCT: 38.7 % — ABNORMAL LOW (ref 39.0–52.0)
Hemoglobin: 13.2 g/dL (ref 13.0–17.0)
MCHC: 34.1 g/dL (ref 30.0–36.0)
MCV: 87.7 fL (ref 78.0–100.0)
Platelets: 162 10*3/uL (ref 150–400)
RBC: 4.41 MIL/uL (ref 4.22–5.81)
RDW: 12.9 % (ref 11.5–15.5)
WBC: 9.9 10*3/uL (ref 4.0–10.5)

## 2010-04-15 LAB — RAPID STREP SCREEN (MED CTR MEBANE ONLY): Streptococcus, Group A Screen (Direct): NEGATIVE

## 2010-04-16 ENCOUNTER — Telehealth: Payer: Self-pay | Admitting: Family Medicine

## 2010-04-16 NOTE — Telephone Encounter (Signed)
Okay to fill? 

## 2010-04-16 NOTE — Telephone Encounter (Signed)
Med refilled as requested

## 2010-04-16 NOTE — Telephone Encounter (Signed)
Refill x 1 and let him know

## 2010-04-17 NOTE — Telephone Encounter (Signed)
Med refilled.

## 2010-04-26 ENCOUNTER — Encounter: Payer: Self-pay | Admitting: Family Medicine

## 2010-04-29 ENCOUNTER — Encounter: Payer: Self-pay | Admitting: Family Medicine

## 2010-05-09 ENCOUNTER — Telehealth: Payer: Self-pay | Admitting: Family Medicine

## 2010-05-09 NOTE — Telephone Encounter (Signed)
Nasal congestion, yellow mucus, pain and pressure x Tuesday.  [facility] [place] [facility]. Advised Mucinex and he already has Nasonex. No appts available. Does he need to be worked in or should he just call back if symptoms worsen?

## 2010-05-10 NOTE — Telephone Encounter (Signed)
Patient aware and will call back is symptoms worsen

## 2010-05-10 NOTE — Telephone Encounter (Signed)
Agree pls let pt know, no fever or chills , use decongestants, saline nose washes, call backiif fever otr chills

## 2010-05-15 ENCOUNTER — Telehealth: Payer: Self-pay | Admitting: Family Medicine

## 2010-05-16 ENCOUNTER — Telehealth: Payer: Self-pay | Admitting: Family Medicine

## 2010-05-16 ENCOUNTER — Other Ambulatory Visit: Payer: Self-pay

## 2010-05-16 ENCOUNTER — Encounter: Payer: Self-pay | Admitting: Family Medicine

## 2010-05-16 MED ORDER — HYDROCODONE-ACETAMINOPHEN 10-660 MG PO TABS: 1.0000 | ORAL_TABLET | Freq: Two times a day (BID) | ORAL | Status: DC | PRN

## 2010-05-16 NOTE — Telephone Encounter (Signed)
I did not call this patient. I did speak with him on the 12th but the question he had was resolved.

## 2010-05-16 NOTE — Telephone Encounter (Signed)
I suggest he keep appt in am , ent appts  Are sometimes difficult to get

## 2010-05-16 NOTE — Telephone Encounter (Signed)
Has an appt tomorrow but he had said he wanted a referral to ENT for his sinuses. Still having mucus, now its green and gray and also some blood. Do you want to refer or wait until you see him tomorrow?

## 2010-05-17 ENCOUNTER — Ambulatory Visit (INDEPENDENT_AMBULATORY_CARE_PROVIDER_SITE_OTHER): Payer: 59 | Admitting: Family Medicine

## 2010-05-17 ENCOUNTER — Encounter: Payer: Self-pay | Admitting: Family Medicine

## 2010-05-17 VITALS — BP 130/92 | HR 87 | Resp 16 | Ht 71.0 in | Wt 186.0 lb

## 2010-05-17 DIAGNOSIS — J339 Nasal polyp, unspecified: Secondary | ICD-10-CM

## 2010-05-17 DIAGNOSIS — J329 Chronic sinusitis, unspecified: Secondary | ICD-10-CM

## 2010-05-17 DIAGNOSIS — H9190 Unspecified hearing loss, unspecified ear: Secondary | ICD-10-CM

## 2010-05-17 DIAGNOSIS — H9319 Tinnitus, unspecified ear: Secondary | ICD-10-CM

## 2010-05-17 MED ORDER — PENICILLIN V POTASSIUM 500 MG PO TABS: 500.0000 mg | ORAL_TABLET | Freq: Three times a day (TID) | ORAL | Status: AC

## 2010-05-17 NOTE — Telephone Encounter (Signed)
Patient here for appt.  
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
## 2010-05-18 ENCOUNTER — Encounter: Payer: Self-pay | Admitting: Family Medicine
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
## 2010-05-20 ENCOUNTER — Encounter: Payer: Self-pay | Admitting: Family Medicine

## 2010-05-20 ENCOUNTER — Telehealth: Payer: Self-pay | Admitting: Family Medicine

## 2010-05-20 NOTE — Telephone Encounter (Signed)
That is all the patient wants [place] to tell the nurse from his insurance company.

## 2010-05-20 NOTE — Telephone Encounter (Signed)
pls let pt know without a signed consent form no info is given out on pt's

## 2010-05-20 NOTE — Telephone Encounter (Signed)
noted 

## 2010-05-21 ENCOUNTER — Telehealth: Payer: Self-pay | Admitting: Family Medicine
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
## 2010-05-22 ENCOUNTER — Encounter: Payer: Self-pay | Admitting: Family Medicine

## 2010-05-23 ENCOUNTER — Ambulatory Visit: Payer: 59 | Admitting: Family Medicine

## 2010-06-11 ENCOUNTER — Telehealth: Payer: Self-pay | Admitting: Family Medicine

## 2010-06-11 ENCOUNTER — Other Ambulatory Visit: Payer: Self-pay

## 2010-06-11 MED ORDER — HYDROCODONE-ACETAMINOPHEN 10-660 MG PO TABS: 1.0000 | ORAL_TABLET | Freq: Two times a day (BID) | ORAL | Status: DC | PRN
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
## 2010-06-11 NOTE — Telephone Encounter (Signed)
Called and left message to call back.

## 2010-06-17 ENCOUNTER — Ambulatory Visit (HOSPITAL_COMMUNITY)
Admission: RE | Admit: 2010-06-17 | Discharge: 2010-06-17 | Disposition: A | Discharge status: Home / Self Care | Payer: 59 | Source: Ambulatory Visit | Attending: Neurology | Admitting: Neurology

## 2010-06-17 DIAGNOSIS — Z1389 Encounter for screening for other disorder: Secondary | ICD-10-CM | POA: Insufficient documentation

## 2010-06-17 DIAGNOSIS — R55 Syncope and collapse: Secondary | ICD-10-CM | POA: Insufficient documentation

## 2010-06-27 ENCOUNTER — Encounter: Payer: Self-pay | Admitting: Family Medicine

## 2010-07-11 ENCOUNTER — Telehealth: Payer: Self-pay | Admitting: Family Medicine

## 2010-07-11 MED ORDER — HYDROCODONE-ACETAMINOPHEN 10-660 MG PO TABS: ORAL_TABLET | ORAL | Status: DC

## 2010-07-26 ENCOUNTER — Inpatient Hospital Stay (INDEPENDENT_AMBULATORY_CARE_PROVIDER_SITE_OTHER)
Admission: RE | Admit: 2010-07-26 | Discharge: 2010-07-26 | Disposition: A | Discharge status: Home / Self Care | Payer: 59 | Source: Ambulatory Visit | Attending: Emergency Medicine | Admitting: Emergency Medicine

## 2010-07-26 DIAGNOSIS — F0781 Postconcussional syndrome: Secondary | ICD-10-CM

## 2010-08-05 ENCOUNTER — Telehealth: Payer: Self-pay | Admitting: Family Medicine

## 2010-08-05 ENCOUNTER — Other Ambulatory Visit: Payer: Self-pay

## 2010-08-05 MED ORDER — HYDROCODONE-ACETAMINOPHEN 10-660 MG PO TABS: ORAL_TABLET | ORAL | Status: DC

## 2010-08-05 NOTE — Telephone Encounter (Signed)
Patient aware med sent

## 2010-08-07 ENCOUNTER — Telehealth: Payer: Self-pay | Admitting: Family Medicine

## 2010-08-08 NOTE — Procedures (Signed)
NAME:  Evan Jacobson, Evan Jacobson NO.:  MEDICAL RECORD NO.:  [id number]           PATIENT TYPE:  LOCATION:                                 FACILITY:  PHYSICIAN:  Laiah Pouncey A. Gerilyn Pilgrim, M.D. DATE OF BIRTH:  Sep 24, 1965  DATE OF PROCEDURE: DATE OF DISCHARGE:                             EEG INTERPRETATION   EEG NUMBER:  INDICATIONS:  A 45 year old man who presents for recurrent spells of confusion and syncope.  The study has been done to evaluate for seizures.  MEDICATIONS:  Cymbalta.  ANALYSIS:  A 16-channel recording is conducted for 21 minutes, standard 10/20 measurements were used.  There was a well-formed posterior dominant rhythm of 12.5 which attenuates with eye opening.  There is beta activity observed in frontal areas.  Awake and drowsy activities are recorded.  Photic stimulation and hyperventilation are carried out without abnormal changes in the background activity.  There was no focal or lateralized slowing.  There was no epileptiform activity observed.  IMPRESSION:  Normal recording in awake and drowsy states.     Julena Barbour A. Gerilyn Pilgrim, M.D.     KAD/MEDQ  D:  06/18/2010  T:  06/18/2010  Job:  [id number]

## 2010-08-12 ENCOUNTER — Other Ambulatory Visit: Payer: Self-pay | Admitting: Family Medicine

## 2010-08-12 DIAGNOSIS — F32A Depression, unspecified: Secondary | ICD-10-CM

## 2010-08-12 DIAGNOSIS — F329 Major depressive disorder, single episode, unspecified: Secondary | ICD-10-CM

## 2010-08-12 NOTE — Telephone Encounter (Signed)
Yes I will enter referral let him know please

## 2010-08-12 NOTE — Telephone Encounter (Signed)
Referral entered pls do

## 2010-08-15 ENCOUNTER — Encounter: Payer: Self-pay | Admitting: Family Medicine

## 2010-08-15 ENCOUNTER — Telehealth: Payer: Self-pay

## 2010-08-15 ENCOUNTER — Ambulatory Visit (INDEPENDENT_AMBULATORY_CARE_PROVIDER_SITE_OTHER): Payer: Worker's Compensation | Admitting: Family Medicine

## 2010-08-15 VITALS — BP 120/80 | HR 69 | Resp 16 | Ht 72.0 in | Wt 196.0 lb

## 2010-08-15 DIAGNOSIS — F0781 Postconcussional syndrome: Secondary | ICD-10-CM

## 2010-08-15 DIAGNOSIS — R5381 Other malaise: Secondary | ICD-10-CM

## 2010-08-15 DIAGNOSIS — R5383 Other fatigue: Secondary | ICD-10-CM

## 2010-08-15 DIAGNOSIS — Z125 Encounter for screening for malignant neoplasm of prostate: Secondary | ICD-10-CM

## 2010-08-15 DIAGNOSIS — J309 Allergic rhinitis, unspecified: Secondary | ICD-10-CM

## 2010-08-15 DIAGNOSIS — I10 Essential (primary) hypertension: Secondary | ICD-10-CM

## 2010-08-15 DIAGNOSIS — Z13220 Encounter for screening for lipoid disorders: Secondary | ICD-10-CM

## 2010-08-15 MED ORDER — LEVOCETIRIZINE DIHYDROCHLORIDE 5 MG PO TABS: 5.0000 mg | ORAL_TABLET | Freq: Every evening | ORAL | Status: DC

## 2010-08-15 MED ORDER — METHYLPREDNISOLONE ACETATE 80 MG/ML IJ SUSP
80.0000 mg | Freq: Once | INTRAMUSCULAR | Status: AC
  Administered 2010-08-15: 80 mg via INTRAMUSCULAR

## 2010-08-15 MED ORDER — PREDNISONE (PAK) 5 MG PO TABS: 5.0000 mg | ORAL_TABLET | ORAL | Status: DC

## 2010-08-15 NOTE — Progress Notes (Signed)
  Subjective:    Patient ID: Evan Jacobson, male    DOB: 1965-08-01, 45 y.o.   MRN: [id number]  HPI Pt reports that he is still having problems from his concussion. He reports loss of conciousness on [address] on May 12 , while he was crossing the street , fell out flat in the middle of the road, saw his neurologist several days later and reports he was told that  this was probably due to his concussion. States that  on June 25, he was released to return to work with  with driving restriction,and that his wife or son should take him to work, but no restriction on the job to return July 1,2012.  At that point he decided he needed legal help, and also states he was told that he was told by his manager that he was a liability. Reports driving lifts on the job.Wants opinion with Dr Sandria Manly a second neurologist, per his attorney, and will need extension of work excuse until he sees Dr Sandria Manly. States he is confused, because his job involves driving , and he cannot understand how he is released to work in this job , although he states he was advised against driving himself on the road  C/O uncontrolled  Allergy symptoms wants shot, clear nasal drainage, itchy watery red eyes, excessive sneezing, x 3 weeks, does  Not use the astepro and nasonex  Since concerned about palpitations, the astepro causes palpitations     Review of Systems See HPI Denies recent fever or chills. Denies sinus pressure,  ear pain or sore throat. Denies chest congestion, productive cough or wheezing. Denies chest pains, palpitations and leg swelling Denies abdominal pain, nausea, vomiting,diarrhea or constipation.   Denies skin break down or rash.        Objective:   Physical Exam Patient alert and oriented and in no cardiopulmonary distress.  HEENT: No facial asymmetry, EOMI, no sinus tenderness,  oropharynx pink and moist.  Neck supple no adenopathy. Nasal mucosa erythematous and edematous.  Chest: Clear to  auscultation bilaterally.  CVS: S1, S2 no murmurs, no S3.  ABD: Soft non tender. Bowel sounds normal.  Ext: No edema  MS: Adequate ROM spine, shoulders, hips and knees.  Skin: Intact, no ulcerations or rash noted.  Psych: Good eye contact, normal affect. Not anxious or depressed appearing.  CNS: CN 2-12 intact, power, tone and sensation normal throughout.        Assessment & Plan:

## 2010-08-15 NOTE — Patient Instructions (Signed)
CPE as before, psa today  Fasting labs before CPE   You are being referred for a 2nd neurology opinion asap.   Injection today for allergies and prednisone dose pack also sent in also allergy tabs will be researched.   Pls try to start the nasonex again  Work excuse   From July 1 to return August mid, pending decision of neurologis

## 2010-08-16 NOTE — Telephone Encounter (Signed)
Was opening to send a med in but he changed his mind

## 2010-08-26 ENCOUNTER — Ambulatory Visit (INDEPENDENT_AMBULATORY_CARE_PROVIDER_SITE_OTHER): Payer: 59 | Admitting: Psychology

## 2010-08-26 DIAGNOSIS — F39 Unspecified mood [affective] disorder: Secondary | ICD-10-CM

## 2010-08-27 ENCOUNTER — Telehealth: Payer: Self-pay | Admitting: Family Medicine

## 2010-08-27 MED ORDER — HYDROCODONE-ACETAMINOPHEN 10-660 MG PO TABS: ORAL_TABLET | ORAL | Status: DC

## 2010-08-27 NOTE — Telephone Encounter (Signed)
Let him know when you have called it in his request

## 2010-08-27 NOTE — Telephone Encounter (Signed)
MED SENT, LEFT DETAILED MESSAGE

## 2010-09-04 NOTE — Assessment & Plan Note (Signed)
Persistent neurologic prob;ems following injury on the job, with recurrent syncopal episodes. 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

## 2010-09-04 NOTE — Assessment & Plan Note (Signed)
Controlled, no change in medication  

## 2010-09-04 NOTE — Assessment & Plan Note (Signed)
Uncontrolled, medication administered at visit as well as prescribed

## 2010-09-16 ENCOUNTER — Encounter (INDEPENDENT_AMBULATORY_CARE_PROVIDER_SITE_OTHER): Payer: Worker's Compensation | Admitting: Psychology

## 2010-09-16 DIAGNOSIS — F0630 Mood disorder due to known physiological condition, unspecified: Secondary | ICD-10-CM

## 2010-09-16 DIAGNOSIS — F09 Unspecified mental disorder due to known physiological condition: Secondary | ICD-10-CM

## 2010-09-29 ENCOUNTER — Encounter (HOSPITAL_COMMUNITY): Payer: Self-pay | Admitting: Emergency Medicine

## 2010-09-29 ENCOUNTER — Emergency Department (HOSPITAL_COMMUNITY)
Admission: EM | Admit: 2010-09-29 | Discharge: 2010-09-30 | Disposition: A | Discharge status: Home / Self Care | Payer: 59 | Attending: Emergency Medicine | Admitting: Emergency Medicine

## 2010-09-29 DIAGNOSIS — I498 Other specified cardiac arrhythmias: Secondary | ICD-10-CM | POA: Insufficient documentation

## 2010-09-29 DIAGNOSIS — I10 Essential (primary) hypertension: Secondary | ICD-10-CM | POA: Insufficient documentation

## 2010-09-29 DIAGNOSIS — R079 Chest pain, unspecified: Secondary | ICD-10-CM

## 2010-09-29 DIAGNOSIS — Z87891 Personal history of nicotine dependence: Secondary | ICD-10-CM | POA: Insufficient documentation

## 2010-09-29 NOTE — ED Notes (Signed)
Patient c/o midsternal chest pain; states it radiates to his gums.  Patient also c/o shortness of breath; denies N/V.

## 2010-09-30 ENCOUNTER — Emergency Department (HOSPITAL_COMMUNITY): Payer: 59

## 2010-09-30 ENCOUNTER — Other Ambulatory Visit: Payer: Self-pay

## 2010-09-30 LAB — BASIC METABOLIC PANEL
BUN: 17 mg/dL (ref 6–23)
CO2: 26 mEq/L (ref 19–32)
Calcium: 9.2 mg/dL (ref 8.4–10.5)
Chloride: 99 mEq/L (ref 96–112)
Creatinine, Ser: 0.97 mg/dL (ref 0.50–1.35)
GFR calc Af Amer: >60 mL/min (ref >60)
GFR calc non Af Amer: >60 mL/min (ref >60)
Glucose, Bld: 108 mg/dL — ABNORMAL HIGH (ref 70–99)
Potassium: 4 mEq/L (ref 3.5–5.1)
Sodium: 133 mEq/L — ABNORMAL LOW (ref 135–145)

## 2010-09-30 LAB — CBC
HCT: 44.7 % (ref 39.0–52.0)
Hemoglobin: 15.4 g/dL (ref 13.0–17.0)
MCH: 30.1 pg (ref 26.0–34.0)
MCHC: 34.5 g/dL (ref 30.0–36.0)
MCV: 87.5 fL (ref 78.0–100.0)
Platelets: 192 10*3/uL (ref 150–400)
RBC: 5.11 MIL/uL (ref 4.22–5.81)
RDW: 12.7 % (ref 11.5–15.5)
WBC: 4.4 10*3/uL (ref 4.0–10.5)

## 2010-09-30 LAB — CARDIAC PANEL(CRET KIN+CKTOT+MB+TROPI)
CK, MB: 2 ng/mL (ref 0.3–4.0)
CK, MB: 2.2 ng/mL (ref 0.3–4.0)
Relative Index: 0.9 (ref 0.0–2.5)
Relative Index: 1 (ref 0.0–2.5)
Total CK: 192 U/L (ref 7–232)
Total CK: 251 U/L — ABNORMAL HIGH (ref 7–232)
Troponin I: <0.3 ng/mL (ref <0.30)
Troponin I: <0.3 ng/mL (ref <0.30)

## 2010-09-30 MED ORDER — NITROGLYCERIN 0.4 MG SL SUBL
0.4000 mg | SUBLINGUAL_TABLET | Freq: Once | SUBLINGUAL | Status: AC
  Administered 2010-09-30: 0.4 mg via SUBLINGUAL

## 2010-09-30 MED ORDER — NITROGLYCERIN 0.4 MG SL SUBL
SUBLINGUAL_TABLET | SUBLINGUAL | Status: AC
  Administered 2010-09-30: 0.4 mg via SUBLINGUAL
  Filled 2010-09-30: qty 25

## 2010-09-30 MED ORDER — ASPIRIN 81 MG PO CHEW
CHEWABLE_TABLET | ORAL | Status: AC
  Administered 2010-09-30: 324 mg via ORAL
  Filled 2010-09-30: qty 4

## 2010-09-30 MED ORDER — ASPIRIN 81 MG PO CHEW
324.0000 mg | CHEWABLE_TABLET | Freq: Once | ORAL | Status: AC
  Administered 2010-09-30: 324 mg via ORAL

## 2010-09-30 NOTE — ED Provider Notes (Addendum)
History     CSN: [id number] Arrival date & time: 09/29/2010 11:56 PM  Chief Complaint  Patient presents with  . Chest Pain   Patient is a 45 y.o. male presenting with chest pain. The history is provided by the patient.  Chest Pain The chest pain began 3 - 5 hours ago (pain began substernal chest pain at 830 pm). Chest pain occurs constantly. The chest pain is unchanged. At its most intense, the pain is at 10/10 (initial pain was 10-12 on 10 point scale. Once arrived in ER, given ASA and ntg x 2 with pain going to a 5/10.). The pain is currently at 0/10. The severity of the pain is moderate. The quality of the pain is described as burning and heavy. Radiates to: pain radiated to both lower jaws. Primary symptoms comment: chest pain Risk factors include male gender.  His family medical history is significant for CAD in family, heart disease in family, hyperlipidemia in family and hypertension in family.     Past Medical History  Diagnosis Date  . Allergic rhinitis   . GERD (gastroesophageal reflux disease)   . Hypertension   . Sinusitis   . Back pain   . Concussion 2012    History reviewed. No pertinent past surgical history.  Family History  Problem Relation Age of Onset  . Heart attack Father   . Hypertension Mother     Pilar Grammes  . Hypertension Father   . Hypertension Sister   . Hypertension Sister   . Hypertension Brother   . Hypertension Brother     History  Substance Use Topics  . Smoking status: Former Smoker    Types: Cigarettes  . Smokeless tobacco: Not on file  . Alcohol Use: Yes     occ      Review of Systems  Cardiovascular: Positive for chest pain.  All other systems reviewed and are negative.    Physical Exam  BP 122/87  Pulse 69  Temp(Src) 98.2 F (36.8 C) (Oral)  Resp 20  Ht 5\' 11"  (1.803 m)  Wt 190 lb (86.183 kg)  BMI 26.50 kg/m2  SpO2 99%  Physical Exam  ED Course  Procedures   Date: 09/30/2010 0001  Rate: 57  Rhythm: sinus  bradycardia and sinus arrhythmia  QRS Axis: normal  Intervals: normal  ST/T Wave abnormalities: normal  Conduction Disutrbances:none  Narrative Interpretation:   Old EKG Reviewed: unchanged from 07/07/09   Results for orders placed during the hospital encounter of 09/29/10  CBC      Component Value Range   WBC 4.4  4.0 - 10.5 (K/uL)   RBC 5.11  4.22 - 5.81 (MIL/uL)   Hemoglobin 15.4  13.0 - 17.0 (g/dL)   HCT 24.4  01.0 - 27.2 (%)   MCV 87.5  78.0 - 100.0 (fL)   MCH 30.1  26.0 - 34.0 (pg)   MCHC 34.5  30.0 - 36.0 (g/dL)   RDW 53.6  64.4 - 03.4 (%)   Platelets 192  150 - 400 (K/uL)  BASIC METABOLIC PANEL      Component Value Range   Sodium 133 (*) 135 - 145 (mEq/L)   Potassium 4.0  3.5 - 5.1 (mEq/L)   Chloride 99  96 - 112 (mEq/L)   CO2 26  19 - 32 (mEq/L)   Glucose, Bld 108 (*) 70 - 99 (mg/dL)   BUN 17  6 - 23 (mg/dL)   Creatinine, Ser 7.42  0.50 - 1.35 (mg/dL)   Calcium 9.2  8.4 - 10.5 (mg/dL)   GFR calc non Af Amer >60  >60 (mL/min)   GFR calc Af Amer >60  >60 (mL/min)  CARDIAC PANEL(CRET KIN+CKTOT+MB+TROPI)      Component Value Range   Total CK 251 (*) 7 - 232 (U/L)   CK, MB 2.2  0.3 - 4.0 (ng/mL)   Troponin I <0.30  <0.30 (ng/mL)   Relative Index 0.9  0.0 - 2.5   CARDIAC PANEL(CRET KIN+CKTOT+MB+TROPI)      Component Value Range   Total CK 192  7 - 232 (U/L)   CK, MB 2.0  0.3 - 4.0 (ng/mL)   Troponin I <0.30  <0.30 (ng/mL)   Relative Index 1.0  0.0 - 2.5    Patient presented with chest pain to the central region of his chest that had been present for 3 hours. He had taken gas x with no releif Pain radiated to his jaw. Cardiac markers x 2 negative, EKG normal, Chest xray normal. Labs unremarkable. Pain relieved with ntg x 2 and asa. Patient has been sleeping last two hours waiting for second cardiac markers. Pt feels improved after observation and/or treatment in ED.Patient informed of clinical course, understand medical decision-making process, and agree with plan.He  will follow up with Dr. Lodema Hong.  MDM Reviewed: nursing note and vitals Interpretation: labs, ECG and x-ray Total time providing critical care: 30-74 minutes. This excludes time spent performing separately reportable procedures and services.    Nicoletta Dress. Colon Branch, MD 09/30/10 [id number]  Nicoletta Dress. Colon Branch, MD 10/16/10 [phone]

## 2010-09-30 NOTE — ED Notes (Signed)
Patient with mid CP since 2000 Sunday night with episode of diaphoresis and nausea, stated that pain radiated to bilateral jaw, drank some milk and an OTC med for heartburn thinking it was indigestion, noted that pain did not ease off and came to hospital for evaluation, denies any cardiac hx

## 2010-10-01 ENCOUNTER — Other Ambulatory Visit (HOSPITAL_COMMUNITY): Payer: Worker's Compensation

## 2010-10-04 ENCOUNTER — Telehealth: Payer: Self-pay | Admitting: Family Medicine

## 2010-10-04 MED ORDER — HYDROCODONE-ACETAMINOPHEN 10-660 MG PO TABS: ORAL_TABLET | ORAL | Status: DC

## 2010-10-04 NOTE — Telephone Encounter (Signed)
Refill sent in

## 2010-10-08 ENCOUNTER — Encounter (INDEPENDENT_AMBULATORY_CARE_PROVIDER_SITE_OTHER): Payer: Worker's Compensation | Admitting: Psychology

## 2010-10-08 DIAGNOSIS — F09 Unspecified mental disorder due to known physiological condition: Secondary | ICD-10-CM

## 2010-10-08 DIAGNOSIS — F0630 Mood disorder due to known physiological condition, unspecified: Secondary | ICD-10-CM

## 2010-10-11 ENCOUNTER — Telehealth: Payer: Self-pay | Admitting: Family Medicine

## 2010-10-11 NOTE — Telephone Encounter (Signed)
Do you know anything about a note?

## 2010-10-13 NOTE — Telephone Encounter (Signed)
The specialists will need to rake him out, I have explained to him, this is why he is seeing a second nerologist at his request, and also dr rodenbaughj, pls explain to pt

## 2010-10-14 NOTE — Telephone Encounter (Signed)
Patient aware.

## 2010-10-22 ENCOUNTER — Encounter (INDEPENDENT_AMBULATORY_CARE_PROVIDER_SITE_OTHER): Payer: 59 | Admitting: Psychology

## 2010-10-22 DIAGNOSIS — F09 Unspecified mental disorder due to known physiological condition: Secondary | ICD-10-CM

## 2010-10-22 DIAGNOSIS — F0630 Mood disorder due to known physiological condition, unspecified: Secondary | ICD-10-CM

## 2010-10-24 ENCOUNTER — Telehealth: Payer: Self-pay | Admitting: Family Medicine

## 2010-10-26 NOTE — Telephone Encounter (Signed)
Patient has appt next week, will discuss at that time

## 2010-10-29 ENCOUNTER — Telehealth: Payer: Self-pay

## 2010-10-29 NOTE — Telephone Encounter (Signed)
He should continue current dose. His HA may not be from blood pressure. If he has any readings >150/100 or his headache worsens he needs to seek care. He should schedule an appt with Dr. Lodema Hong within a week.

## 2010-10-30 MED ORDER — BENAZEPRIL HCL 5 MG PO TABS: ORAL_TABLET | ORAL | Status: DC

## 2010-10-30 MED ORDER — HYDROCODONE-ACETAMINOPHEN 10-660 MG PO TABS: ORAL_TABLET | ORAL | Status: DC

## 2010-10-30 NOTE — Telephone Encounter (Signed)
Patient aware, said his BP was better yesterday evening and today

## 2010-11-08 ENCOUNTER — Telehealth: Payer: Self-pay | Admitting: Family Medicine

## 2010-11-11 ENCOUNTER — Encounter (INDEPENDENT_AMBULATORY_CARE_PROVIDER_SITE_OTHER): Payer: 59 | Admitting: Psychology

## 2010-11-11 DIAGNOSIS — F4310 Post-traumatic stress disorder, unspecified: Secondary | ICD-10-CM

## 2010-11-11 DIAGNOSIS — F09 Unspecified mental disorder due to known physiological condition: Secondary | ICD-10-CM

## 2010-11-11 NOTE — Telephone Encounter (Signed)
Letter written pls type , I will sign , he can collect after

## 2010-11-11 NOTE — Telephone Encounter (Signed)
Typed and given to doctor for signature

## 2010-11-21 ENCOUNTER — Other Ambulatory Visit: Payer: Self-pay | Admitting: Family Medicine

## 2010-11-21 MED ORDER — HYDROCODONE-ACETAMINOPHEN 10-660 MG PO TABS: ORAL_TABLET | ORAL | Status: DC

## 2010-11-21 NOTE — Telephone Encounter (Signed)
Sent in again 

## 2010-11-22 ENCOUNTER — Telehealth: Payer: Self-pay | Admitting: Family Medicine

## 2010-11-22 NOTE — Telephone Encounter (Signed)
Already done yesterday

## 2010-11-26 ENCOUNTER — Encounter (HOSPITAL_COMMUNITY): Payer: 59 | Admitting: Psychology

## 2010-12-11 ENCOUNTER — Encounter: Payer: Self-pay | Admitting: Family Medicine

## 2010-12-13 ENCOUNTER — Ambulatory Visit (INDEPENDENT_AMBULATORY_CARE_PROVIDER_SITE_OTHER): Payer: 59 | Admitting: Family Medicine

## 2010-12-13 ENCOUNTER — Encounter: Payer: Self-pay | Admitting: Family Medicine

## 2010-12-13 VITALS — BP 120/92 | HR 70 | Resp 16 | Ht 71.0 in | Wt 199.1 lb

## 2010-12-13 DIAGNOSIS — F528 Other sexual dysfunction not due to a substance or known physiological condition: Secondary | ICD-10-CM

## 2010-12-13 DIAGNOSIS — F32A Depression, unspecified: Secondary | ICD-10-CM | POA: Insufficient documentation

## 2010-12-13 DIAGNOSIS — Z0000 Encounter for general adult medical examination without abnormal findings: Secondary | ICD-10-CM

## 2010-12-13 DIAGNOSIS — R5383 Other fatigue: Secondary | ICD-10-CM

## 2010-12-13 DIAGNOSIS — N50819 Testicular pain, unspecified: Secondary | ICD-10-CM | POA: Insufficient documentation

## 2010-12-13 DIAGNOSIS — R079 Chest pain, unspecified: Secondary | ICD-10-CM

## 2010-12-13 DIAGNOSIS — N509 Disorder of male genital organs, unspecified: Secondary | ICD-10-CM

## 2010-12-13 DIAGNOSIS — Z1211 Encounter for screening for malignant neoplasm of colon: Secondary | ICD-10-CM

## 2010-12-13 DIAGNOSIS — F329 Major depressive disorder, single episode, unspecified: Secondary | ICD-10-CM

## 2010-12-13 DIAGNOSIS — R5381 Other malaise: Secondary | ICD-10-CM

## 2010-12-13 DIAGNOSIS — Z125 Encounter for screening for malignant neoplasm of prostate: Secondary | ICD-10-CM

## 2010-12-13 DIAGNOSIS — Z13220 Encounter for screening for lipoid disorders: Secondary | ICD-10-CM

## 2010-12-13 DIAGNOSIS — I10 Essential (primary) hypertension: Secondary | ICD-10-CM

## 2010-12-13 DIAGNOSIS — K219 Gastro-esophageal reflux disease without esophagitis: Secondary | ICD-10-CM

## 2010-12-13 LAB — HEMOCCULT GUIAC POC 1CARD (OFFICE): Fecal Occult Blood, POC: NEGATIVE

## 2010-12-13 MED ORDER — ESOMEPRAZOLE MAGNESIUM 40 MG PO CPDR: 40.0000 mg | DELAYED_RELEASE_CAPSULE | Freq: Every day | ORAL | Status: DC

## 2010-12-13 MED ORDER — FLUOXETINE HCL 10 MG PO CAPS: 10.0000 mg | ORAL_CAPSULE | Freq: Every day | ORAL | Status: DC

## 2010-12-13 MED ORDER — TADALAFIL 5 MG PO TABS: 5.0000 mg | ORAL_TABLET | Freq: Every day | ORAL | Status: DC | PRN

## 2010-12-13 NOTE — Patient Instructions (Addendum)
F/U in 5 .5 months.   You are referred  To cardiology regarding recent chest pain.  You are referred to urology regarding testicular pain  For the past  2 months  New med is cialis for Ed, use 2 tablets prior to intercourse, although the script states take daily. You are prescribed the low dose as a money saver.  New med for depression   Fasting labs as soon as possible  It is important that you exercise regularly at least 30 minutes 5 times a week. If you develop chest pain, have severe difficulty breathing, or feel very tired, stop exercising immediately and seek medical attention    A healthy diet is rich in fruit, vegetables and whole grains. Poultry fish, nuts and beans are a healthy choice for protein rather then red meat. A low sodium diet and drinking 64 ounces of water daily is generally recommended. Oils and sweet should be limited. Carbohydrates especially for those who are diabetic or overweight, should be limited to 30-45 gram per meal. It is important to eat on a regular schedule, at least 3 times daily. Snacks should be primarily fruits, vegetables or nuts.

## 2010-12-13 NOTE — Assessment & Plan Note (Signed)
Increased symptoms in the past several months since he has been out of work due to post concussion syndrome. Has been seeing the therapist regularlly which has helped. Mention was made several months ago for adding an anti depressant by psychology, will prescribe med, discussed with pt. Less depressed has a job starting in December, but does report fluctuation in mood and has benefited in the past from med

## 2010-12-13 NOTE — Assessment & Plan Note (Signed)
Reports compliance with medication, however diastolic pressure still elevated, low sodium diet and regular exercise encouraged, also increased fruit and veg

## 2010-12-13 NOTE — Progress Notes (Signed)
  Subjective:    Patient ID: Evan Jacobson, male    DOB: Jul 27, 1965, 45 y.o.   MRN: [id number]  HPI Patient is here for his annual exam. He states he recently was in the ED with left chest pain , non radiating, he had a negative initial work up, was advised to follow up with his PCP , and is doing so now. States his mother had cardiovascular disease in her 50's. None of the rest of his history is espescialy worrisome for cardiovascular disease. 2 month h/o pain in testicle and cord, no trauma or penile discharge.c/o difficulty maintaining erections and requests med for this C/o increased GERD symptoms states the chest pain he has had 2 months agio was not GERd, wants to resume nexium. 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   Review of Systems See HPI Denies recent fever or chills. Denies sinus pressure, nasal congestion, ear pain or sore throat. Denies chest congestion, productive cough or wheezing. Denies PND, orthopnea,  palpitations and leg swelling Denies  nausea, vomiting,diarrhea or constipation.   Denies dysuria, frequency, hesitancy or incontinence. Denies joint pain, swelling and limitation in mobility. Denies headaches, seizures, numbness, or tingling. Denies uncontrolled  depression, anxiety or insomnia. Denies skin break down or rash.        Objective:   Physical Exam  Pleasant well nourished male, alert and oriented x 3, in no cardio-pulmonary distress. Afebrile. HEENT No facial trauma or asymetry. Sinuses non tender. EOMI, PERTL, fundoscopic exam is negative for hemorhages or exudates. External ears normal, tympanic membranes clear. Oropharynx moist, no exudate, good dentition. Neck: supple, no adenopathy,JVD or thyromegaly.No bruits.  Chest: Clear to ascultation bilaterally.No crackles or wheezes. Non tender to palpation  Breast: No asymetry,no masses. No nipple discharge or  inversion. No axillary or supraclavicular adenopathy  Cardiovascular system; Heart sounds normal,  S1 and  S2 ,no S3.  No murmur, or thrill. Apical beat not displaced Peripheral pulses normal.  Abdomen: Soft, non tender, no organomegaly or masses. No bruits. Bowel sounds normal. No guarding, tenderness or rebound.  Rectal:  No mass. guaiac negative stool. Prostate smooth and firm  GU: Not examined   Musculoskeletal exam: Full ROM of spine, hips , shoulders and knees. No deformity ,swelling or crepitus noted. No muscle wasting or atrophy.   Neurologic: Cranial nerves 2 to 12 intact. Power, tone ,sensation and reflexes normal throughout. No disturbance in gait. No tremor.  Skin: Intact, no ulceration, erythema , scaling or rash noted. Pigmentation normal throughout  Psych; Normal mood and affect. Judgement and concentration normal        Assessment & Plan:

## 2010-12-13 NOTE — Progress Notes (Signed)
Stool card [id number] 8/13

## 2010-12-13 NOTE — Assessment & Plan Note (Signed)
2 month h/o  Intermittent left chest pain, noassociated nausea or diaphoresis, has noted SOB with this seen in ED in 09/30/2010, Mom had CAD in her 50's. Pain occurs at rest. Cardiology eval

## 2010-12-13 NOTE — Assessment & Plan Note (Signed)
2 month h/o testicular pain, also has mild erectile dysfunction and wants med for this.Denies penile d/c. Will refer to urology

## 2010-12-18 ENCOUNTER — Ambulatory Visit (INDEPENDENT_AMBULATORY_CARE_PROVIDER_SITE_OTHER): Payer: 59 | Admitting: Cardiology

## 2010-12-18 ENCOUNTER — Telehealth: Payer: Self-pay | Admitting: Family Medicine

## 2010-12-18 ENCOUNTER — Telehealth: Payer: Self-pay | Admitting: *Deleted

## 2010-12-18 ENCOUNTER — Encounter: Payer: Self-pay | Admitting: Cardiology

## 2010-12-18 VITALS — BP 125/83 | HR 93 | Ht 71.0 in | Wt 199.0 lb

## 2010-12-18 DIAGNOSIS — R079 Chest pain, unspecified: Secondary | ICD-10-CM

## 2010-12-18 DIAGNOSIS — R072 Precordial pain: Secondary | ICD-10-CM

## 2010-12-18 DIAGNOSIS — I10 Essential (primary) hypertension: Secondary | ICD-10-CM

## 2010-12-18 NOTE — Assessment & Plan Note (Signed)
Blood pressure controlled. Continue present medications. 

## 2010-12-18 NOTE — Assessment & Plan Note (Signed)
Symptoms extremely atypical and may have been musculoskeletal. Scheduled treadmill. If negative no further evaluation.

## 2010-12-18 NOTE — Patient Instructions (Signed)
Your physician recommends that you schedule a follow-up appointment in: As Needed  Treadmill Stress Test  

## 2010-12-18 NOTE — Progress Notes (Signed)
HPI: 45 year old male with no prior cardiac history for evaluation of chest pain. The patient typically does not have dyspnea on exertion, orthopnea, PND, pedal edema, palpitations, syncope or exertional chest pain. On September 3 the patient had left-sided chest pain. It was described as a stabbing sensation. No radiation. Increased with movements. There was shortness of breath but no nausea or diaphoresis. Patient seen in the emergency room. Chest x-ray and cardiac markers negative. No chest pain since. Total duration of his pain was approximately 6 hours.  Current Outpatient Prescriptions  Medication Sig Dispense Refill  . benazepril (LOTENSIN) 5 MG tablet Take one tablet by mouth once a day  Take 5 mg by mouth daily. Take one tablet by mouth once a day    30 tablet  3  . DULoxetine (CYMBALTA) 20 MG capsule Take 20 mg by mouth daily.        Marland Kitchen esomeprazole (NEXIUM) 40 MG capsule Take 1 capsule (40 mg total) by mouth daily.  30 capsule  3  . Hydrocodone-Acetaminophen (VICODIN HP) 10-660 MG TABS Take 1 tablet by mouth two times a day as needed  60 each  1  . ibuprofen (ADVIL,MOTRIN) 800 MG tablet Take 800 mg by mouth 3 (three) times daily.        Marland Kitchen levocetirizine (XYZAL) 5 MG tablet Take 1 tablet (5 mg total) by mouth every evening.  30 tablet  3  . predniSONE, Pak, (STERAPRED) 5 MG TABS Take 1 tablet (5 mg total) by mouth as directed.  21 tablet  0  . tadalafil (CIALIS) 5 MG tablet Take 1 tablet (5 mg total) by mouth daily as needed for erectile dysfunction.  30 tablet  2    Allergies  Allergen Reactions  . Sulfonamide Derivatives     Past Medical History  Diagnosis Date  . Allergic rhinitis   . GERD (gastroesophageal reflux disease)   . Hypertension   . Concussion 2012    No past surgical history on file.  History   Social History  . Marital Status: Married    Spouse Name: N/A    Number of Children: 2  . Years of Education: N/A   Occupational History  .      Audio Surveyor, quantity  .     Social History Main Topics  . Smoking status: Never Smoker   . Smokeless tobacco: Not on file  . Alcohol Use: Yes     occ; six pack per week  . Drug Use: No  . Sexually Active: Not on file   Other Topics Concern  . Not on file   Social History Narrative  . No narrative on file    Family History  Problem Relation Age of Onset  . Stroke Father   . Hypertension Mother     Pilar Grammes  . Hypertension Father   . Hypertension Sister   . Hypertension Sister   . Hypertension Brother   . Hypertension Brother   . Heart disease Mother     Angina    ROS: problems with back pain but  no fevers or chills, productive cough, hemoptysis, dysphasia, odynophagia, melena, hematochezia, dysuria, hematuria, rash, seizure activity, orthopnea, PND, pedal edema, claudication. Remaining systems are negative.  Physical Exam:  Blood pressure 125/83, pulse 93, height 5\' 11"  (1.803 m), weight 199 lb (90.266 kg), SpO2 99.00%.  General:  Well developed/well nourished in NAD Skin warm/dry Patient not depressed No peripheral clubbing Back-normal HEENT-normal/normal eyelids Neck supple/normal carotid upstroke bilaterally; no bruits; no  JVD; no thyromegaly chest - CTA/ normal expansion CV - RRR/normal S1 and S2; no murmurs, rubs or gallops;  PMI nondisplaced Abdomen -NT/ND, no HSM, no mass, + bowel sounds, no bruit 2+ femoral pulses, no bruits Ext-no edema, chords, 2+ DP Neuro-grossly nonfocal  ECG September 30, 2010-sinus rhythm with sinus arrhythmia. No significant ST changes.

## 2010-12-24 MED ORDER — HYDROCODONE-ACETAMINOPHEN 10-660 MG PO TABS: ORAL_TABLET | ORAL | Status: DC

## 2010-12-24 NOTE — Telephone Encounter (Signed)
Back started hurting last week and he was walking funny. Last week his knees started swelling and now its better but he wants to know if it could be due to the way he had been walking or why that would happen. Better now though

## 2010-12-24 NOTE — Telephone Encounter (Signed)
May have mild arthritis in thse back, with that pain comes and goes

## 2010-12-25 NOTE — Telephone Encounter (Signed)
Patient aware.

## 2010-12-26 ENCOUNTER — Ambulatory Visit (INDEPENDENT_AMBULATORY_CARE_PROVIDER_SITE_OTHER): Payer: 59 | Admitting: *Deleted

## 2010-12-26 ENCOUNTER — Encounter (HOSPITAL_COMMUNITY): Payer: Self-pay | Admitting: Cardiology

## 2010-12-26 DIAGNOSIS — R079 Chest pain, unspecified: Secondary | ICD-10-CM

## 2010-12-26 NOTE — Progress Notes (Signed)
Stress Lab Nurses Notes - Evan Jacobson  Evan Jacobson 12/26/2010  Reason for doing test: Chest Pain Type of test: Regular GTX Nurse performing test: Parke Poisson, RN Nuclear Medicine Tech: Not Applicable Echo Tech: Not Applicable MD performing test: Ival Bible & Joni Reining NP Family MD: Lodema Hong Test explained and consent signed: yes IV started: No IV started Symptoms: fatigue Treatment/Intervention: None Reason test stopped: fatigue After recovery IV was: NA Patient to return to Nuc. Med at : NA Patient discharged: Home Patient's Condition upon discharge was: stable Comments:  During test peak BP 170/70 & HR 176.  Recovery BP 128/78 & HR 93.  Symptoms resolved in recovery. Erskine Speed T

## 2010-12-26 NOTE — Progress Notes (Signed)
Patient exercised on a standard Bruce protocol for 11 minutes and 15 seconds achieving a maximum workload of 13.6 METs. Heart rate increased from 71 beats per minute up to 176 beats per minute, exceeding 85% of the maximum age predicted heart rate response. Peak blood pressure was 170/70. No chest pain was reported. No clearly diagnostic ST segment changes were noted by standard criteria, no significant arrhythmias were noted. Overall negative exercise treadmill test for ischemia by standard criteria with hypertensive response.

## 2010-12-30 ENCOUNTER — Telehealth: Payer: Self-pay | Admitting: Family Medicine

## 2011-01-01 NOTE — Telephone Encounter (Signed)
Spoke with patient and he stated that he had handled to situation already.

## 2011-01-04 LAB — LIPID PANEL
Cholesterol: 184 mg/dL (ref 0–200)
HDL: 50 mg/dL (ref >39)
LDL Cholesterol: 125 mg/dL — ABNORMAL HIGH (ref 0–99)
Total CHOL/HDL Ratio: 3.7 Ratio
Triglycerides: 47 mg/dL (ref <150)
VLDL: 9 mg/dL (ref 0–40)

## 2011-01-04 LAB — BASIC METABOLIC PANEL
BUN: 12 mg/dL (ref 6–23)
CO2: 27 mEq/L (ref 19–32)
Calcium: 9.2 mg/dL (ref 8.4–10.5)
Chloride: 103 mEq/L (ref 96–112)
Creat: 1.15 mg/dL (ref 0.50–1.35)
Glucose, Bld: 83 mg/dL (ref 70–99)
Potassium: 4.4 mEq/L (ref 3.5–5.3)
Sodium: 138 mEq/L (ref 135–145)

## 2011-01-04 LAB — TSH: TSH: 1.064 u[IU]/mL (ref 0.350–4.500)

## 2011-01-04 LAB — PSA: PSA: 0.83 ng/mL (ref <=4.00)

## 2011-01-14 ENCOUNTER — Telehealth: Payer: Self-pay | Admitting: Family Medicine

## 2011-01-14 NOTE — Telephone Encounter (Signed)
Spoke with pt and he stated that he would come pick up a copy of his lab results.

## 2011-02-04 ENCOUNTER — Ambulatory Visit: Payer: 59 | Admitting: Urology

## 2011-03-18 ENCOUNTER — Other Ambulatory Visit: Payer: Self-pay | Admitting: Family Medicine

## 2011-03-18 ENCOUNTER — Telehealth: Payer: Self-pay | Admitting: Family Medicine

## 2011-03-19 NOTE — Telephone Encounter (Signed)
One only needs ov

## 2011-03-19 NOTE — Telephone Encounter (Signed)
I have sent the message to Dr Algie Coffer for approval

## 2011-03-19 NOTE — Telephone Encounter (Signed)
How many refills for this pt? 

## 2011-03-20 ENCOUNTER — Other Ambulatory Visit: Payer: Self-pay

## 2011-03-20 MED ORDER — HYDROCODONE-ACETAMINOPHEN 10-660 MG PO TABS: ORAL_TABLET | ORAL | Status: DC

## 2011-03-21 NOTE — Telephone Encounter (Signed)
Pt aware.

## 2011-04-07 ENCOUNTER — Encounter: Payer: Self-pay | Admitting: Family Medicine

## 2011-04-07 ENCOUNTER — Ambulatory Visit (INDEPENDENT_AMBULATORY_CARE_PROVIDER_SITE_OTHER): Payer: 59 | Admitting: Family Medicine

## 2011-04-07 VITALS — BP 120/88 | HR 75 | Resp 16 | Ht 71.0 in | Wt 202.1 lb

## 2011-04-07 DIAGNOSIS — J309 Allergic rhinitis, unspecified: Secondary | ICD-10-CM

## 2011-04-07 DIAGNOSIS — I10 Essential (primary) hypertension: Secondary | ICD-10-CM

## 2011-04-07 DIAGNOSIS — M549 Dorsalgia, unspecified: Secondary | ICD-10-CM

## 2011-04-07 MED ORDER — PREDNISONE (PAK) 5 MG PO TABS: 5.0000 mg | ORAL_TABLET | ORAL | Status: DC

## 2011-04-07 NOTE — Patient Instructions (Signed)
F/u in 4.5 month  You are being referred for an mRI of your low back, you have arthritis and disc disease with nerve compression which is worsening. On exam, you have weakness and numbness of your left leg Prednisone dose pack is sent in to your pharmacy for pain  You will get a handicap sticker.  You need to work on a 8 to 10 pound weight loss,this will improve your blood pressure which is still slightly high  MRI scan done previously are 09/08/2003 and 12/08/2006

## 2011-04-07 NOTE — Assessment & Plan Note (Signed)
Uncontrolled, lifestyle change only

## 2011-04-08 ENCOUNTER — Ambulatory Visit: Payer: 59 | Admitting: Urology

## 2011-04-09 ENCOUNTER — Ambulatory Visit (HOSPITAL_COMMUNITY): Payer: 59

## 2011-04-20 NOTE — Progress Notes (Signed)
  Subjective:    Patient ID: CONNELLY NETTERVILLE, male    DOB: 01-Nov-1965, 46 y.o.   MRN: [id number]  HPI The PT is here for follow up and re-evaluation of chronic medical conditions, medication management and review of any available recent lab and radiology data.  Preventive health is updated, specifically  Cancer screening and Immunization.   Questions or concerns regarding consultations or procedures which the PT has had in the interim are  addressed. The PT denies any adverse reactions to current medications since the last visit.  C/O increased back pain, radiating to lower extremities    Review of Systems See HPI Denies recent fever or chills. Denies sinus pressure, nasal congestion, ear pain or sore throat. Denies chest congestion, productive cough or wheezing. Denies chest pains, palpitations and leg swelling Denies abdominal pain, nausea, vomiting,diarrhea or constipation.   Denies dysuria, frequency, hesitancy or incontinence. Denies headaches, seizures, numbness, or tingling. Denies depression, anxiety or insomnia. Denies skin break down or rash.        Objective:   Physical Exam Patient alert and oriented and in no cardiopulmonary distress.  HEENT: No facial asymmetry, EOMI, no sinus tenderness,  oropharynx pink and moist.  Neck supple no adenopathy.  Chest: Clear to auscultation bilaterally.  CVS: S1, S2 no murmurs, no S3.  ABD: Soft non tender. Bowel sounds normal.  Ext: No edema  MS: decreased  ROM spine,adequate in  shoulders, hips and knees.  Skin: Intact, no ulcerations or rash noted.  Psych: Good eye contact, normal affect. Memory intact not anxious or depressed appearing.  CNS: CN 2-12 intact, power, and sensation reduced in lower extremities       Assessment & Plan:

## 2011-04-20 NOTE — Assessment & Plan Note (Signed)
Worsened needs rept MRI spine

## 2011-05-13 ENCOUNTER — Telehealth: Payer: Self-pay | Admitting: Family Medicine

## 2011-05-13 MED ORDER — HYDROCODONE-ACETAMINOPHEN 10-660 MG PO TABS: ORAL_TABLET | ORAL | Status: DC

## 2011-05-13 NOTE — Telephone Encounter (Signed)
Printed for Dr to sign  

## 2011-06-13 ENCOUNTER — Ambulatory Visit: Payer: 59 | Admitting: Family Medicine

## 2011-07-07 ENCOUNTER — Other Ambulatory Visit: Payer: Self-pay

## 2011-07-07 ENCOUNTER — Telehealth: Payer: Self-pay | Admitting: Family Medicine

## 2011-07-07 MED ORDER — HYDROCODONE-ACETAMINOPHEN 10-660 MG PO TABS: ORAL_TABLET | ORAL | Status: DC

## 2011-07-07 NOTE — Telephone Encounter (Signed)
Med refilled.

## 2011-07-08 ENCOUNTER — Other Ambulatory Visit: Payer: Self-pay | Admitting: Family Medicine

## 2011-08-25 ENCOUNTER — Encounter: Payer: Self-pay | Admitting: Family Medicine

## 2011-08-25 ENCOUNTER — Ambulatory Visit (INDEPENDENT_AMBULATORY_CARE_PROVIDER_SITE_OTHER): Payer: 59 | Admitting: Family Medicine

## 2011-08-25 VITALS — BP 130/92 | HR 62 | Resp 15 | Ht 71.0 in | Wt 201.1 lb

## 2011-08-25 DIAGNOSIS — R5381 Other malaise: Secondary | ICD-10-CM

## 2011-08-25 DIAGNOSIS — Z79899 Other long term (current) drug therapy: Secondary | ICD-10-CM

## 2011-08-25 DIAGNOSIS — R5383 Other fatigue: Secondary | ICD-10-CM

## 2011-08-25 DIAGNOSIS — I10 Essential (primary) hypertension: Secondary | ICD-10-CM

## 2011-08-25 DIAGNOSIS — R7301 Impaired fasting glucose: Secondary | ICD-10-CM

## 2011-08-25 DIAGNOSIS — J309 Allergic rhinitis, unspecified: Secondary | ICD-10-CM

## 2011-08-25 DIAGNOSIS — Z125 Encounter for screening for malignant neoplasm of prostate: Secondary | ICD-10-CM

## 2011-08-25 DIAGNOSIS — M549 Dorsalgia, unspecified: Secondary | ICD-10-CM

## 2011-08-25 DIAGNOSIS — F319 Bipolar disorder, unspecified: Secondary | ICD-10-CM

## 2011-08-25 HISTORY — DX: Bipolar disorder, unspecified: F31.9

## 2011-08-25 MED ORDER — MOMETASONE FUROATE 50 MCG/ACT NA SUSP: 2.0000 | Freq: Every day | NASAL | Status: DC

## 2011-08-25 NOTE — Patient Instructions (Addendum)
Annual exam  early December.  Fasting lipid, chem 7, TSH, pSA cbc, 3 to 5 days before visit (after Nov 20)  No change in blood pressure medication though this is slightly high PLEASE STOP Daily sudafed. Start nasonex, this is sent in  Eat a diet mainly of fruit and vegetable, fresh or frozen, stop sodas and canned foods which are all high in sodium and commit to 30 minutes of physical activity every day

## 2011-09-01 ENCOUNTER — Other Ambulatory Visit: Payer: Self-pay

## 2011-09-01 MED ORDER — HYDROCODONE-ACETAMINOPHEN 10-660 MG PO TABS: ORAL_TABLET | ORAL | Status: DC

## 2011-09-04 ENCOUNTER — Telehealth: Payer: Self-pay | Admitting: Family Medicine

## 2011-09-04 ENCOUNTER — Encounter: Payer: Self-pay | Admitting: Family Medicine

## 2011-09-04 ENCOUNTER — Ambulatory Visit (INDEPENDENT_AMBULATORY_CARE_PROVIDER_SITE_OTHER): Payer: 59 | Admitting: Family Medicine

## 2011-09-04 VITALS — BP 124/74 | HR 74 | Resp 18 | Ht 71.0 in | Wt 200.1 lb

## 2011-09-04 DIAGNOSIS — M549 Dorsalgia, unspecified: Secondary | ICD-10-CM

## 2011-09-04 MED ORDER — METHOCARBAMOL 500 MG PO TABS: 500.0000 mg | ORAL_TABLET | Freq: Three times a day (TID) | ORAL | Status: AC

## 2011-09-04 MED ORDER — OXYCODONE-ACETAMINOPHEN 5-325 MG PO TABS: 1.0000 | ORAL_TABLET | Freq: Three times a day (TID) | ORAL | Status: AC | PRN

## 2011-09-04 NOTE — Patient Instructions (Signed)
Take muscle relaxant and percocet for pain  Use heat, try to move around and stretch Work note to be given  Keep previous f/u with Dr. Lodema Hong

## 2011-09-04 NOTE — Progress Notes (Signed)
  Subjective:    Patient ID: Evan Jacobson, male    DOB: 31-Aug-1965, 46 y.o.   MRN: [id number]  HPI Pt presents with back pain for past 24 hours, he has history of chronic back pain secondary to multiple level slipped disc, he has been evaluated by neurosurgeons and at this time medication management,  Yesterday he was picking up some heavy objects and felt a pull in his back, since then he has been very stiff with increased pain. His typical hydrocodone is not helping. No change in bowel or bladder   Review of Systems - per abpve  GEN- denies fatigue, fever, weight loss,weakness, recent illness CVS- denies chest pain, palpitations RESP- denies SOB, cough, wheeze ABD- denies N/V, change in stools, abd pain GU- denies dysuria, hematuria, dribbling, incontinence MSK- + joint pain, +muscle aches, injury       Objective:   Physical Exam GEN- Uncomfortable appearing, no cardiopulmonary distress, alert and oriented x 3 Back- TTP lumbar spine+ paraspinal spasm, neg SLR, decreased ROM, pain with twist, squat, flexion and extension, very stiff Neuro- DTR symmetric, antalgic gait, strength equal bilat lower ext        Assessment & Plan:

## 2011-09-04 NOTE — Assessment & Plan Note (Signed)
Acute on chronic back pain, Pt has been evaluated by specialist. No new weakness or radicular symptoms today. Change to short term Percocet- he can discuss with PCP her chronic pain medication as he feels this may be building a tolerance to this. Robaxin muscle relaxant. Heat and stretching to help stiffness.

## 2011-09-05 ENCOUNTER — Telehealth: Payer: Self-pay | Admitting: Family Medicine

## 2011-09-05 ENCOUNTER — Ambulatory Visit: Payer: 59 | Admitting: Family Medicine

## 2011-09-05 NOTE — Telephone Encounter (Signed)
Came in yesterday for OV

## 2011-09-06 NOTE — Assessment & Plan Note (Signed)
Followed by mental health through [facility] [place], states he suffers from PTSD and has done so for a long time. States psychiatry counseled against employment but he desperately wants to work

## 2011-09-06 NOTE — Assessment & Plan Note (Signed)
Stressed the importance of daily use of medication as prescribed to control symptoms

## 2011-09-06 NOTE — Progress Notes (Signed)
  Subjective:    Patient ID: Evan Jacobson, male    DOB: 1965/10/17, 46 y.o.   MRN: [id number]  HPI The PT is here for follow up and re-evaluation of chronic medical conditions, medication management and review of any available recent lab and radiology data.  Preventive health is updated, specifically  Cancer screening and Immunization.   Recently started a job, and is hopeful he will be able to  continue. Followed regularly by psych for pTSD, states his therapist advised against him working but he wants to work. Has past h/o back pain and current job involves lifting , so this also is a concern      Review of Systems See HPI Denies recent fever or chills. Denies sinus pressure, ear pain or sore throat.Has increased nasal drainage and congestion, and sneezing, he is non compliant with daily use of medication to control the symptoms, but will change  Denies chest congestion, productive cough or wheezing. Denies chest pains, palpitations and leg swelling Denies abdominal pain, nausea, vomiting,diarrhea or constipation.   Denies dysuria, frequency, hesitancy or incontinence. Denies joint pain, swelling and limitation in mobility. Denies headaches, seizures, numbness, or tingling. Denies uncontrolled depression, anxiety or insomnia. Denies skin break down or rash.        Objective:   Physical Exam  Patient alert and oriented and in no cardiopulmonary distress.  HEENT: No facial asymmetry, EOMI, no sinus tenderness,  oropharynx pink and moist.  Neck supple no adenopathy.  Chest: Clear to auscultation bilaterally.  CVS: S1, S2 no murmurs, no S3.  ABD: Soft non tender. Bowel sounds normal.  Ext: No edema  MS: Adequate ROM spine, shoulders, hips and knees.  Skin: Intact, no ulcerations or rash noted.  Psych: Good eye contact, normal affect. Memory intact not anxious or depressed appearing.  CNS: CN 2-12 intact, power, tone and sensation normal throughout.         Assessment & Plan:

## 2011-09-06 NOTE — Assessment & Plan Note (Signed)
Chronic problem , maintain pt on chronic pain medication, he is cautioned re the impotance of appropriate lifting and bending

## 2011-09-06 NOTE — Assessment & Plan Note (Signed)
Uncontrolled, no med change DASH diet and commitment to daily physical activity for a minimum of 30 minutes discussed and encouraged, as a part of hypertension management. The importance of attaining a healthy weight is also discussed.  

## 2011-09-08 ENCOUNTER — Other Ambulatory Visit: Payer: Self-pay | Admitting: Family Medicine

## 2011-09-08 ENCOUNTER — Telehealth: Payer: Self-pay

## 2011-09-08 MED ORDER — PREDNISONE (PAK) 5 MG PO TABS: 5.0000 mg | ORAL_TABLET | ORAL | Status: DC

## 2011-09-08 NOTE — Telephone Encounter (Signed)
Pt spoke with PCP regarding this.

## 2011-09-08 NOTE — Telephone Encounter (Signed)
Evan Jacobson he has been in bed all weekend with his back pain and he doesn't think he can go back to work tomorrow. Wants you to give him a call today if possible or extend his work note

## 2011-09-08 NOTE — Telephone Encounter (Signed)
Pt states that he is still experiencing back pain as much as a 9, has been in  Bed all weekend needs help even to go to the bathroom, requests support when applying for disability, unable to return to work at this time, asking for extension of work excuse, needs to be seen in office tomorrow, understands he needs to start prednisone dose pack today at [employer]

## 2011-09-09 ENCOUNTER — Ambulatory Visit (INDEPENDENT_AMBULATORY_CARE_PROVIDER_SITE_OTHER): Payer: 59 | Admitting: Family Medicine

## 2011-09-09 ENCOUNTER — Encounter: Payer: Self-pay | Admitting: Family Medicine

## 2011-09-09 ENCOUNTER — Telehealth: Payer: Self-pay | Admitting: Family Medicine

## 2011-09-09 VITALS — BP 138/96 | HR 80 | Resp 15 | Ht 71.0 in | Wt 198.0 lb

## 2011-09-09 DIAGNOSIS — I10 Essential (primary) hypertension: Secondary | ICD-10-CM

## 2011-09-09 DIAGNOSIS — J329 Chronic sinusitis, unspecified: Secondary | ICD-10-CM

## 2011-09-09 DIAGNOSIS — M549 Dorsalgia, unspecified: Secondary | ICD-10-CM

## 2011-09-09 DIAGNOSIS — R51 Headache: Secondary | ICD-10-CM

## 2011-09-09 DIAGNOSIS — F319 Bipolar disorder, unspecified: Secondary | ICD-10-CM

## 2011-09-09 MED ORDER — KETOROLAC TROMETHAMINE 60 MG/2ML IM SOLN
60.0000 mg | Freq: Once | INTRAMUSCULAR | Status: AC
  Administered 2011-09-09: 60 mg via INTRAMUSCULAR

## 2011-09-09 MED ORDER — METHYLPREDNISOLONE ACETATE 80 MG/ML IJ SUSP
80.0000 mg | Freq: Once | INTRAMUSCULAR | Status: AC
  Administered 2011-09-09: 80 mg via INTRAMUSCULAR

## 2011-09-09 MED ORDER — PENICILLIN V POTASSIUM 500 MG PO TABS: 500.0000 mg | ORAL_TABLET | Freq: Three times a day (TID) | ORAL | Status: AC

## 2011-09-09 NOTE — Progress Notes (Signed)
  Subjective:    Patient ID: Evan Jacobson, male    DOB: [birth year], 46 y.o.   MRN: [id number]  HPI Pt reports that he actually hurt his back on the job last week while lifting approx 75 pounds. States he did not tell the truth when the supervisor asked and said he had hurt it at home the night before. This is the 2nd clinic visit since. No improvement in symptoms of low back pain to both buttocks, Ambulation is near impossible, denies incontinence or lower extremity numbness, has known back disease. States he is being asked to provide weight restrictions on his return to work Also has a 2 day h/o throbbing generalized headache with nausea and photophobia , no localized weakness, has had similar in  the past. Also c/o yellow sinus drainage since yesterday, denies sore throat or productive cough   Review of Systems See HPI Denies recent fever or chills. Denies , nasal congestion, ear pain or sore throat. Denies chest congestion, productive cough or wheezing. Denies chest pains, palpitations and leg swelling Denies abdominal pain, nausea, vomiting,diarrhea or constipation.   Denies dysuria, frequency, hesitancy or incontinence. . Denies  seizures, numbness, or tingling. c/o depression, anxiety and  Insomnia.Worried about pain and inability to work Denies skin break down or rash.       Objective:   Physical Exam  Patient alert and in obvious pain, lying flat in a dark room  HEENT: No facial asymmetry, EOMI, frontal  sinus tenderness,  oropharynx pink and moist.  Neck supple no adenopathy.  Chest: Clear to auscultation bilaterally.  CVS: S1, S2 no murmurs, no S3.  ABD: Soft non tender. Bowel sounds normal.  Ext: No edema  MS: decreased ROM spine, adequate in shoulders, hips and knees.  Skin: Intact, no ulcerations or rash noted.  Psych: Good eye contact, flat  affect. Memory intact  Anxious and  depressed appearing.  CNS: CN 2-12 intact, power,  normal  throughout.       Assessment & Plan:

## 2011-09-09 NOTE — Patient Instructions (Addendum)
F/u in 2 month  You wil get toradol 60mg  and depo medrol 80mg  IM in the office for back pain and headache.  Please take prednisone course sent in, also take the muscle relaxant robaxin as prescribed.  Since you are having yellow sinus drainage a course of antibiotic is prescribed  You are referred to orthopedics for further evaluation of the back pain, since you need restrictions provided, your, work excuse is from 8/12 to return 8/19, any extension or restrictions will be from orthopedics  I will cancel the mRI of your back per your request and leave orthopedics to determine if you need this

## 2011-09-10 ENCOUNTER — Ambulatory Visit (HOSPITAL_COMMUNITY)
Admission: RE | Admit: 2011-09-10 | Discharge: 2011-09-10 | Disposition: A | Discharge status: Home / Self Care | Payer: 59 | Source: Ambulatory Visit | Attending: Family Medicine | Admitting: Family Medicine

## 2011-09-10 DIAGNOSIS — M545 Low back pain, unspecified: Secondary | ICD-10-CM | POA: Insufficient documentation

## 2011-09-10 DIAGNOSIS — M5126 Other intervertebral disc displacement, lumbar region: Secondary | ICD-10-CM | POA: Insufficient documentation

## 2011-09-10 DIAGNOSIS — M549 Dorsalgia, unspecified: Secondary | ICD-10-CM

## 2011-09-10 DIAGNOSIS — M5137 Other intervertebral disc degeneration, lumbosacral region: Secondary | ICD-10-CM | POA: Insufficient documentation

## 2011-09-10 DIAGNOSIS — M79609 Pain in unspecified limb: Secondary | ICD-10-CM | POA: Insufficient documentation

## 2011-09-10 DIAGNOSIS — M51379 Other intervertebral disc degeneration, lumbosacral region without mention of lumbar back pain or lower extremity pain: Secondary | ICD-10-CM | POA: Insufficient documentation

## 2011-09-12 NOTE — Assessment & Plan Note (Signed)
Increased, uncontrolled and worsened in the past week. Steroids, ortho eval espescialy as this relates to work restrictions,MRI spine

## 2011-09-12 NOTE — Assessment & Plan Note (Signed)
Uncontrolled, anti inflammatories administered in office 

## 2011-09-12 NOTE — Assessment & Plan Note (Signed)
Elevated blood pressure at this visit likely related to uncontrolled pain and fatigue, no med change today

## 2011-09-12 NOTE — Assessment & Plan Note (Signed)
Acute sinus symptoms, antibiotic prescribed

## 2011-09-12 NOTE — Assessment & Plan Note (Signed)
[facility], also reports PtSD states he was told he was unable to work based on mental health

## 2011-09-25 ENCOUNTER — Telehealth: Payer: Self-pay | Admitting: Family Medicine

## 2011-09-26 NOTE — Telephone Encounter (Signed)
Called pt and left message

## 2011-09-26 NOTE — Telephone Encounter (Signed)
Patient aware.

## 2011-09-26 NOTE — Telephone Encounter (Signed)
Needs a letter stating that he is unemployable due to his chronic back problems. [facility] Dr's agree and would have gave him a letter themselves but they didn't think it would carry as much weight as his primary care Dr. Andrey Cota he needs this next week if possible

## 2011-09-26 NOTE — Telephone Encounter (Signed)
Pls ask pt to request either his [facility] doc, or the psych treating him fax a note t this effect, I will provide such a note based on what I get from them, thanks

## 2011-10-14 ENCOUNTER — Telehealth: Payer: Self-pay | Admitting: Family Medicine

## 2011-10-14 NOTE — Telephone Encounter (Signed)
Will forward to PCP 

## 2011-10-14 NOTE — Telephone Encounter (Signed)
I suggest since the pain med he has is not working he see a pain s[pecialist for back pain management I am not changing the med he is on

## 2011-10-17 NOTE — Telephone Encounter (Signed)
Called and left message for pt to return call.  

## 2011-10-20 NOTE — Telephone Encounter (Signed)
Patient aware of no change in pain med.  Stated that he does not want referral to pain specialist.

## 2011-10-28 ENCOUNTER — Telehealth: Payer: Self-pay | Admitting: Family Medicine

## 2011-10-28 MED ORDER — HYDROCODONE-ACETAMINOPHEN 10-660 MG PO TABS: ORAL_TABLET | ORAL | Status: DC

## 2011-10-28 NOTE — Telephone Encounter (Signed)
Med refilled.

## 2011-10-28 NOTE — Telephone Encounter (Signed)
pls reschedule 10/22 appt to Novemeber,and let pt know thanks

## 2011-10-28 NOTE — Telephone Encounter (Signed)
pls refill x 2 , has an appt for 10/22, put him to front to reschedule in November please

## 2011-10-28 NOTE — Telephone Encounter (Signed)
Patient has appointment in November

## 2011-10-28 NOTE — Telephone Encounter (Signed)
Are you still filling this for him?

## 2011-11-13 ENCOUNTER — Telehealth: Payer: Self-pay | Admitting: Family Medicine

## 2011-11-13 NOTE — Telephone Encounter (Signed)
My opinion is that either collecting films from the radiology dept or getting the reports from the hospital would be the most appropriate.If these are reports he can get on my chart, then that's th easiest route. My "writing a letter" would be quoting the radiologist who read it, and this is not the appropriate way to handle this Please explain and help him through the process

## 2011-11-17 NOTE — Telephone Encounter (Signed)
Called patient and left message for them to return call at the office   

## 2011-11-17 NOTE — Telephone Encounter (Signed)
Patient aware.

## 2011-11-18 ENCOUNTER — Ambulatory Visit: Payer: 59 | Admitting: Family Medicine

## 2011-11-23 ENCOUNTER — Other Ambulatory Visit: Payer: Self-pay | Admitting: Family Medicine

## 2011-12-02 ENCOUNTER — Ambulatory Visit: Payer: 59 | Admitting: Family Medicine

## 2011-12-09 ENCOUNTER — Encounter: Payer: Self-pay | Admitting: Family Medicine

## 2011-12-09 ENCOUNTER — Ambulatory Visit (INDEPENDENT_AMBULATORY_CARE_PROVIDER_SITE_OTHER): Payer: 59 | Admitting: Family Medicine

## 2011-12-09 VITALS — BP 122/84 | HR 62 | Resp 18 | Ht 71.0 in | Wt 206.1 lb

## 2011-12-09 DIAGNOSIS — G4700 Insomnia, unspecified: Secondary | ICD-10-CM | POA: Insufficient documentation

## 2011-12-09 DIAGNOSIS — F4310 Post-traumatic stress disorder, unspecified: Secondary | ICD-10-CM

## 2011-12-09 DIAGNOSIS — F1020 Alcohol dependence, uncomplicated: Secondary | ICD-10-CM

## 2011-12-09 DIAGNOSIS — M549 Dorsalgia, unspecified: Secondary | ICD-10-CM

## 2011-12-09 DIAGNOSIS — I10 Essential (primary) hypertension: Secondary | ICD-10-CM

## 2011-12-09 DIAGNOSIS — F319 Bipolar disorder, unspecified: Secondary | ICD-10-CM

## 2011-12-09 MED ORDER — HYDROCODONE-ACETAMINOPHEN 10-660 MG PO TABS: ORAL_TABLET | ORAL | Status: DC

## 2011-12-09 NOTE — Patient Instructions (Addendum)
F/u as before  Blood pressure is excellent, I know you will start taking the blood pressure medications [facility]  Please bring all medication and your labs to next visit  Please focus on the joy of living, stop using alcohol

## 2011-12-09 NOTE — Progress Notes (Signed)
  Subjective:    Patient ID: Evan Jacobson, male    DOB: 07/11/1965, 46 y.o.   MRN: [id number]  HPI Pt currently followed regularly at [facility] for mental health issues which are disabling, also for gneral medical health, hypertension and back pain. States he is diagnosed as PTSD, bipolar, depression and anxiety Pt requesting a note to support worsening back pain and debility since 2008. Actual MRI states no change, but pt is experiencing daily back pain greater than 5 radiaites down both legs 4 times per week.No h/o incontinence, weakness  or numbness. States he devolps pain after standing for 10 minutes, stooping or bending for 2 minutes leads to  Report problems with alcohol dependence, discussed openly the need for help Thought process still very disturbed, not suicidal or homicidal or hallucinating,   Review of Systems See HPI Denies recent fever or chills. Denies sinus pressure, nasal congestion, ear pain or sore throat. Denies chest congestion, productive cough or wheezing. Denies chest pains, palpitations and leg swelling Denies abdominal pain, nausea, vomiting,diarrhea or constipation.   Denies dysuria, frequency, hesitancy or incontinence.  Denies headaches, seizures, numbness, or tingling.  Denies skin break down or rash.        Objective:   Physical Exam Patient alert and oriented and in no cardiopulmonary distress.  HEENT: No facial asymmetry, EOMI, no sinus tenderness,  oropharynx pink and moist.  Neck supple no adenopathy.  Chest: Clear to auscultation bilaterally.  CVS: S1, S2 no murmurs, no S3.  ABD: Soft non tender. Bowel sounds normal.  Ext: No edema  MS: Adequate ROM spine, shoulders, hips and knees.  Skin: Intact, no ulcerations or rash noted.  Psych: Good eye contact, flat affect.  CNS: CN 2-12 intact, power, tone and sensation normal throughout.        Assessment & Plan:

## 2011-12-19 DIAGNOSIS — F1020 Alcohol dependence, uncomplicated: Secondary | ICD-10-CM | POA: Insufficient documentation

## 2011-12-19 NOTE — Assessment & Plan Note (Signed)
Self reported dependence on alcohol for pain as well as sleep both conditions he is already receiving prescription meds for. Counseled re need to discontinue alcohol and to get help through the mental health services he is already a part of for this addiction problem

## 2011-12-19 NOTE — Assessment & Plan Note (Addendum)
Sleep hygiene discussed pt to adopt healthy sleep habits. No meds prescribed, he is on med through psychiatry

## 2011-12-19 NOTE — Assessment & Plan Note (Signed)
Unchanged, continue meds as before 

## 2011-12-19 NOTE — Assessment & Plan Note (Signed)
Controlled, no change in medication DASH diet and commitment to daily physical activity for a minimum of 30 minutes discussed and encouraged, as a part of hypertension management. The importance of attaining a healthy weight is also discussed.  

## 2011-12-19 NOTE — Assessment & Plan Note (Signed)
Under intensive treatment through the [place] , which he needs, not currently suicidal or homicidal,

## 2012-01-19 ENCOUNTER — Telehealth: Payer: Self-pay | Admitting: Family Medicine

## 2012-01-19 MED ORDER — HYDROCODONE-ACETAMINOPHEN 10-660 MG PO TABS: ORAL_TABLET | ORAL | Status: DC

## 2012-01-19 NOTE — Telephone Encounter (Signed)
Printed for Dr to sign  

## 2012-01-26 ENCOUNTER — Encounter: Payer: Self-pay | Admitting: Family Medicine

## 2012-01-26 ENCOUNTER — Ambulatory Visit (INDEPENDENT_AMBULATORY_CARE_PROVIDER_SITE_OTHER): Payer: 59 | Admitting: Family Medicine

## 2012-01-26 VITALS — BP 130/92 | HR 80 | Resp 16 | Ht 71.0 in | Wt 208.1 lb

## 2012-01-26 DIAGNOSIS — I10 Essential (primary) hypertension: Secondary | ICD-10-CM

## 2012-01-26 DIAGNOSIS — Z1211 Encounter for screening for malignant neoplasm of colon: Secondary | ICD-10-CM

## 2012-01-26 DIAGNOSIS — N509 Disorder of male genital organs, unspecified: Secondary | ICD-10-CM

## 2012-01-26 DIAGNOSIS — J309 Allergic rhinitis, unspecified: Secondary | ICD-10-CM

## 2012-01-26 DIAGNOSIS — IMO0002 Reserved for concepts with insufficient information to code with codable children: Secondary | ICD-10-CM | POA: Insufficient documentation

## 2012-01-26 DIAGNOSIS — Z0000 Encounter for general adult medical examination without abnormal findings: Secondary | ICD-10-CM

## 2012-01-26 LAB — POC HEMOCCULT BLD/STL (OFFICE/1-CARD/DIAGNOSTIC): Fecal Occult Blood, POC: NEGATIVE

## 2012-01-26 MED ORDER — LEVOFLOXACIN 500 MG PO TABS: 500.0000 mg | ORAL_TABLET | Freq: Every day | ORAL | Status: AC

## 2012-01-26 MED ORDER — LORATADINE 10 MG PO TABS: 10.0000 mg | ORAL_TABLET | Freq: Every day | ORAL | Status: DC

## 2012-01-26 NOTE — Progress Notes (Signed)
  Subjective:    Patient ID: Evan Jacobson, male    DOB: [birth year], 46 y.o.   MRN: [id number]  HPI The PT is here for follow up and re-evaluation of chronic medical conditions, medication management and review of any available recent lab and radiology data.  Preventive health is updated, specifically  Cancer screening and Immunization.  Refuses flu vaccine . The PT denies any adverse reactions to current medications since the last visit.  C/o testicular and scrotal pain both sides following intercourse approx 2 to 3 weeks ago. Not active since that tme   Review of Systems See HPI Denies recent fever or chills. Denies sinus pressure, nasal congestion, ear pain or sore throat. Denies chest congestion, productive cough or wheezing. Denies chest pains, palpitations and leg swelling Denies abdominal pain, nausea, vomiting,diarrhea or constipation.   Denies dysuria, frequency, hesitancy or incontinence. Denies joint pain, swelling and limitation in mobility. Denies headaches, seizures, numbness, or tingling. Chronic  depression, anxiety and PTSD with alcohol dependence , treated through  [facility] [place] Denies skin break down or rash.        Objective:   Physical Exam Pleasant well nourished male, alert and oriented x 3, in no cardio-pulmonary distress. Afebrile. HEENT No facial trauma or asymetry. Sinuses non tender. EOMI, PERTL, fundoscopic exam is negative for hemorhages or exudates. External ears normal, tympanic membranes clear. Oropharynx moist, no exudate, good dentition. Neck: supple, no adenopathy,JVD or thyromegaly.No bruits.  Chest: Clear to ascultation bilaterally.No crackles or wheezes. Non tender to palpation  Breast: No asymetry,no masses. No nipple discharge or inversion. No axillary or supraclavicular adenopathy  Cardiovascular system; Heart sounds normal,  S1 and  S2 ,no S3.  No murmur, or thrill. Apical beat not displaced Peripheral pulses  normal.  Abdomen: Soft, non tender, no organomegaly or masses. No bruits. Bowel sounds normal. No guarding, tenderness or rebound.  Rectal:  No mass. guaiac negative stool. Prostate smooth and firm  GU: No penile lesion or discharge. Both testicles tender, left greater than right .  Musculoskeletal exam: Full ROM of spine, hips , shoulders and knees. No deformity ,swelling or crepitus noted. No muscle wasting or atrophy.   Neurologic: Cranial nerves 2 to 12 intact. Power, tone ,sensation and reflexes normal throughout. No disturbance in gait. No tremor.  Skin: Intact, no ulceration, erythema , scaling or rash noted. Pigmentation normal throughout  Psych; Flat  affect. Not suicidal or homicidal        Assessment & Plan:

## 2012-01-26 NOTE — Patient Instructions (Addendum)
F/u in 4.5 month, call if you need me before.  Reconsider flu vaccine, recommended.  Please log into "My chart"  Script is given to you for allergies. OK to take 1 sudafed once daily for 2 to 3 days to reduce nasal congestion and drainage  Please get lab from the [place] and when you bring them in bring in the blood pressure med you are taking . The blood pressure is still too high  You need to commit to daily exercise at the [facility] this will improve blood pressure, mental and physical health   You are referred for an ultrasound of the testes and 10 day course of antibiotic is prescribed for presumed epididymitis. If symptoms persist, you will be referred to urologist, please call and let me know

## 2012-01-27 ENCOUNTER — Other Ambulatory Visit: Payer: Self-pay | Admitting: Family Medicine

## 2012-01-27 DIAGNOSIS — Z0000 Encounter for general adult medical examination without abnormal findings: Secondary | ICD-10-CM | POA: Insufficient documentation

## 2012-01-27 DIAGNOSIS — IMO0002 Reserved for concepts with insufficient information to code with codable children: Secondary | ICD-10-CM

## 2012-01-27 NOTE — Assessment & Plan Note (Signed)
[facility]controlled[facility] and [facility] am uncertain of current med DASH diet and commitment to daily physical activity for a minimum of 30 minutes discussed and encouraged, as a part of hypertension management. The importance of attaining a healthy weight is also discussed.

## 2012-01-27 NOTE — Assessment & Plan Note (Signed)
General exam completed as documented.Refuses flu vaccine Pt's blood pressure is elevated, he is to start regular exercise. He is tender in left scrotal sac and c/o pain mental health issus as well as alcohol dependence will continue to be addressed through the [place]
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
## 2012-01-29 ENCOUNTER — Ambulatory Visit (HOSPITAL_COMMUNITY)
Admission: RE | Admit: 2012-01-29 | Discharge: 2012-01-29 | Disposition: A | Discharge status: Home / Self Care | Payer: 59 | Source: Ambulatory Visit | Attending: Family Medicine | Admitting: Family Medicine

## 2012-01-29 DIAGNOSIS — IMO0002 Reserved for concepts with insufficient information to code with codable children: Secondary | ICD-10-CM

## 2012-01-29 DIAGNOSIS — N509 Disorder of male genital organs, unspecified: Secondary | ICD-10-CM | POA: Insufficient documentation

## 2012-01-29 DIAGNOSIS — N433 Hydrocele, unspecified: Secondary | ICD-10-CM | POA: Insufficient documentation

## 2012-02-04 ENCOUNTER — Telehealth: Payer: Self-pay | Admitting: Family Medicine

## 2012-02-05 ENCOUNTER — Telehealth: Payer: Self-pay

## 2012-02-05 ENCOUNTER — Other Ambulatory Visit: Payer: Self-pay | Admitting: Family Medicine

## 2012-02-05 DIAGNOSIS — N5082 Scrotal pain: Secondary | ICD-10-CM

## 2012-02-05 NOTE — Telephone Encounter (Signed)
Please see previous open message about this

## 2012-02-05 NOTE — Telephone Encounter (Signed)
Patient called stating that he was still sore in his testicles and he said he hasn't hurt them in any way. States he read online that sometimes a lumbar strain can cause testicle pain and tenderness. Was wanting to know if that was true? He doesn't know what else could be causing it

## 2012-02-05 NOTE — Telephone Encounter (Signed)
He said he wants to be referred to urology

## 2012-02-05 NOTE — Telephone Encounter (Signed)
That may be possible , but he has not reported acute lumbar strain, so I strongly advise he keeps urology referral this time if he agrees to go. Last year he was referred for the same concern and did not go. Pls let me know if he wants to go, I believe he should since a recurrent , ongoing problem

## 2012-02-05 NOTE — Telephone Encounter (Signed)
Please refer pt to [facility], [place] evel scrotal pain, referral entered

## 2012-02-06 NOTE — Telephone Encounter (Signed)
Appointment 02/25/2012 at the [facility] office at [facility] be there at 12:30

## 2012-02-12 ENCOUNTER — Telehealth: Payer: Self-pay | Admitting: Family Medicine

## 2012-02-12 NOTE — Telephone Encounter (Signed)
Pt states that tues he has 2 sharp pains in his back that shot through down to his testicles and it caused him to fall. He just wanted you to be aware of this.

## 2012-02-12 NOTE — Telephone Encounter (Signed)
He did verify that he was still going to go to urology

## 2012-02-12 NOTE — Telephone Encounter (Signed)
Noted, rwemind him to keep appt with rology, ensure he has one scheduled when you speak to him please

## 2012-02-27 ENCOUNTER — Ambulatory Visit (INDEPENDENT_AMBULATORY_CARE_PROVIDER_SITE_OTHER): Payer: 59

## 2012-02-27 ENCOUNTER — Other Ambulatory Visit: Payer: Self-pay | Admitting: Family Medicine

## 2012-02-27 ENCOUNTER — Telehealth: Payer: Self-pay | Admitting: Family Medicine

## 2012-02-27 VITALS — BP 120/82 | Wt 221.0 lb

## 2012-02-27 DIAGNOSIS — M549 Dorsalgia, unspecified: Secondary | ICD-10-CM

## 2012-02-27 MED ORDER — METHYLPREDNISOLONE ACETATE 80 MG/ML IJ SUSP
80.0000 mg | Freq: Once | INTRAMUSCULAR | Status: AC
  Administered 2012-02-27: 80 mg via INTRAMUSCULAR

## 2012-02-27 MED ORDER — KETOROLAC TROMETHAMINE 60 MG/2ML IM SOLN
60.0000 mg | Freq: Once | INTRAMUSCULAR | Status: AC
  Administered 2012-02-27: 60 mg via INTRAMUSCULAR

## 2012-02-27 MED ORDER — PREDNISONE (PAK) 5 MG PO TABS: 5.0000 mg | ORAL_TABLET | ORAL | Status: DC

## 2012-02-27 NOTE — Telephone Encounter (Signed)
States his back has been hurting and its making his testicles hurt and he has taken a pain pill but its still hurting. Wants to know if he can come get a shot for this?

## 2012-02-27 NOTE — Telephone Encounter (Signed)
Wants to know if his pain med can be changed to something stronger because what he has is not helping. I told him about Dr Eduard Clos but he said he is scared of the steroid injections right now and wants to see if you will give him something stronger. Please advise

## 2012-02-27 NOTE — Progress Notes (Signed)
Pt received injections with no complications 

## 2012-02-27 NOTE — Telephone Encounter (Signed)
Message left for pt to come in

## 2012-02-27 NOTE — Telephone Encounter (Signed)
I spoke with pt directly, he agrees to see pain specialist and know I am sending in short course of prednisone for him

## 2012-02-27 NOTE — Telephone Encounter (Signed)
Ok toradol 60mg  and depo medrol 80 mg IM , nurse visit. Let him know I saw where the urologist saw him and stated the pain was coming from his back, I am also suggesting he see Dr Eduard Clos apian specialist here who can do epidural injections which may improve his quality of life with less pain, let me know if he wants this so I can refer him pls

## 2012-03-19 ENCOUNTER — Telehealth: Payer: Self-pay | Admitting: Family Medicine

## 2012-03-19 MED ORDER — HYDROCODONE-ACETAMINOPHEN 10-325 MG PO TABS: 1.0000 | ORAL_TABLET | Freq: Two times a day (BID) | ORAL | Status: DC | PRN

## 2012-03-19 NOTE — Telephone Encounter (Signed)
Printed to be sent  

## 2012-05-13 ENCOUNTER — Telehealth: Payer: Self-pay | Admitting: Family Medicine

## 2012-05-13 MED ORDER — HYDROCODONE-ACETAMINOPHEN 10-325 MG PO TABS: 1.0000 | ORAL_TABLET | Freq: Two times a day (BID) | ORAL | Status: DC | PRN

## 2012-05-13 NOTE — Telephone Encounter (Signed)
refilll sent

## 2012-06-23 ENCOUNTER — Ambulatory Visit (INDEPENDENT_AMBULATORY_CARE_PROVIDER_SITE_OTHER): Payer: 59 | Admitting: Family Medicine

## 2012-06-23 ENCOUNTER — Encounter: Payer: Self-pay | Admitting: Family Medicine

## 2012-06-23 VITALS — BP 120/88 | HR 88 | Resp 16 | Ht 71.0 in | Wt 203.0 lb

## 2012-06-23 DIAGNOSIS — K219 Gastro-esophageal reflux disease without esophagitis: Secondary | ICD-10-CM

## 2012-06-23 DIAGNOSIS — I10 Essential (primary) hypertension: Secondary | ICD-10-CM

## 2012-06-23 DIAGNOSIS — F1020 Alcohol dependence, uncomplicated: Secondary | ICD-10-CM

## 2012-06-23 DIAGNOSIS — F528 Other sexual dysfunction not due to a substance or known physiological condition: Secondary | ICD-10-CM

## 2012-06-23 DIAGNOSIS — F4310 Post-traumatic stress disorder, unspecified: Secondary | ICD-10-CM

## 2012-06-23 MED ORDER — HYDROCODONE-ACETAMINOPHEN 10-325 MG PO TABS: 1.0000 | ORAL_TABLET | Freq: Two times a day (BID) | ORAL | Status: DC | PRN

## 2012-06-23 NOTE — Assessment & Plan Note (Signed)
Counseled pt re the need to reduce use of alcohol, reports improvement

## 2012-06-23 NOTE — Assessment & Plan Note (Signed)
Ongoing treatment through [facility] [place]

## 2012-06-23 NOTE — Progress Notes (Signed)
  Subjective:    Patient ID: Evan Jacobson, male    DOB: Sep 06, 1965, 47 y.o.   MRN: [id number]  HPI The PT is here for follow up and re-evaluation of chronic medical conditions, medication management and review of any available recent lab and radiology data.  Preventive health is updated, specifically  Cancer screening and Immunization.   Questions or concerns regarding consultations or procedures which the PT has had in the interim are  Addressed.He has been through PT locally per the [place] for back pain. There is some improvement, however he is considering an additional set of visits.Also thinking about swimming as a means of exercise. Pt is also involved in weekly psychotherapy, with some benefit. Was recently awarded his disability, which is obviously a good thing  The PT denies any adverse reactions to current medications since the last visit.  There are no new concerns.  There are no specific complaints  Reports less alcohol dependence      Review of Systems See HPI Denies recent fever or chills. Denies sinus pressure, nasal congestion, ear pain or sore throat. Denies chest congestion, productive cough or wheezing. Denies chest pains, palpitations and leg swelling Denies abdominal pain, nausea, vomiting,diarrhea or constipation.   Denies dysuria, frequency, hesitancy or incontinence. Denies joint pain, swelling and limitation in mobility. Denies headaches, seizures, numbness, or tingling. Denies depression, anxiety or insomnia. Denies skin break down or rash.        Objective:   Physical Exam  Patient alert and oriented and in no cardiopulmonary distress.  HEENT: No facial asymmetry, EOMI, no sinus tenderness,  oropharynx pink and moist.  Neck supple no adenopathy.  Chest: Clear to auscultation bilaterally.  CVS: S1, S2 no murmurs, no S3.  ABD: Soft non tender. Bowel sounds normal.  Ext: No edema  MS: Adequate ROM spine, shoulders, hips and knees.  Skin:  Intact, no ulcerations or rash noted.  Psych: Good eye contact, normal affect. Memory intact not anxious or depressed appearing.  CNS: CN 2-12 intact, power, tone and sensation normal throughout.       Assessment & Plan:

## 2012-06-23 NOTE — Assessment & Plan Note (Signed)
Unchanged, uses medication on an intermittent basis

## 2012-06-23 NOTE — Assessment & Plan Note (Signed)
Controlled, no change in medication DASH diet and commitment to daily physical activity for a minimum of 30 minutes discussed and encouraged, as a part of hypertension management. The importance of attaining a healthy weight is also discussed.  

## 2012-06-23 NOTE — Patient Instructions (Addendum)
cPE in early December, call if you need me before.  I am thankful that you are doing better.   Please do learn to swim and use this as an exercise to help your back  Please bring in labs from the [place] when yoi have them done  Vicodin as before, use one only when able.  Try benadryl 1 at night to help with itch and sleep

## 2012-06-23 NOTE — Assessment & Plan Note (Signed)
managed on zantac reportedly, needs to bring in meds

## 2012-08-12 ENCOUNTER — Telehealth: Payer: Self-pay | Admitting: Family Medicine

## 2012-08-13 NOTE — Telephone Encounter (Signed)
Noted  

## 2012-10-18 ENCOUNTER — Telehealth: Payer: Self-pay | Admitting: Family Medicine

## 2012-10-18 NOTE — Telephone Encounter (Signed)
pls schedule work in visit to address back pain before MRI can be scheduled, he had one in 2013 , Wednesday preferred to Tuesday ifpossible

## 2012-10-19 NOTE — Telephone Encounter (Signed)
Appointment 9.24.2014 @ 2:00 patient is aware

## 2012-10-20 ENCOUNTER — Ambulatory Visit: Payer: 59 | Admitting: Family Medicine

## 2012-10-21 NOTE — Telephone Encounter (Signed)
Patient cancelled appointment.

## 2012-11-02 ENCOUNTER — Telehealth: Payer: Self-pay | Admitting: Family Medicine

## 2012-11-02 NOTE — Telephone Encounter (Signed)
With new regulation son the vicodin, needs to come into the office every 4 months, re pain assessment and management, so pls schedule an appointment within the next 7 days near to when due, pls explain this to him

## 2012-11-02 NOTE — Telephone Encounter (Signed)
Hasn't been since May but you didn't want to see him back until Dec for CPE. Ok to refill hydrocodone?

## 2012-11-03 NOTE — Telephone Encounter (Signed)
Called patient and left message for them to return call at the office   

## 2012-11-05 NOTE — Telephone Encounter (Signed)
Patient aware and will keep appointment for December.

## 2012-11-08 ENCOUNTER — Other Ambulatory Visit: Payer: Self-pay

## 2012-11-08 MED ORDER — HYDROCODONE-ACETAMINOPHEN 10-325 MG PO TABS: 1.0000 | ORAL_TABLET | Freq: Two times a day (BID) | ORAL | Status: DC | PRN

## 2012-11-11 ENCOUNTER — Ambulatory Visit (INDEPENDENT_AMBULATORY_CARE_PROVIDER_SITE_OTHER): Payer: 59 | Admitting: Family Medicine

## 2012-11-11 VITALS — BP 134/82 | HR 80 | Resp 18 | Wt 207.0 lb

## 2012-11-11 DIAGNOSIS — I10 Essential (primary) hypertension: Secondary | ICD-10-CM

## 2012-11-11 DIAGNOSIS — F528 Other sexual dysfunction not due to a substance or known physiological condition: Secondary | ICD-10-CM

## 2012-11-11 DIAGNOSIS — F4310 Post-traumatic stress disorder, unspecified: Secondary | ICD-10-CM

## 2012-11-11 DIAGNOSIS — Z125 Encounter for screening for malignant neoplasm of prostate: Secondary | ICD-10-CM

## 2012-11-11 DIAGNOSIS — F319 Bipolar disorder, unspecified: Secondary | ICD-10-CM

## 2012-11-11 DIAGNOSIS — M549 Dorsalgia, unspecified: Secondary | ICD-10-CM

## 2012-11-11 DIAGNOSIS — J309 Allergic rhinitis, unspecified: Secondary | ICD-10-CM

## 2012-11-11 DIAGNOSIS — Z139 Encounter for screening, unspecified: Secondary | ICD-10-CM

## 2012-11-11 NOTE — Patient Instructions (Signed)
Physical in 4 month  Labs in December as before, fasting  Call if you need me    You will collect you script every 4 weeks

## 2012-11-13 ENCOUNTER — Encounter: Payer: Self-pay | Admitting: Family Medicine

## 2012-11-13 NOTE — Assessment & Plan Note (Signed)
Unchanged pt to continue current pain regime . He is also to sart regular exercise, swimming in particular is encouraged, he will need to learn, but s  Willing to do so, hopefully he will follow through

## 2012-11-13 NOTE — Assessment & Plan Note (Signed)
Pt not taking whole tablet as directed, encouraged to do so, rept BP by me had higher value of 138/88, he agrees to increase dose. DASH diet and commitment to daily physical activity for a minimum of 30 minutes discussed and encouraged, as a part of hypertension management. The importance of attaining a healthy weight is also discussed.

## 2012-11-13 NOTE — Assessment & Plan Note (Signed)
[facility] and controlled

## 2012-11-13 NOTE — Assessment & Plan Note (Signed)
In regular therapy through [place]

## 2012-11-13 NOTE — Assessment & Plan Note (Signed)
Controlled, no change in medication  

## 2012-11-13 NOTE — Assessment & Plan Note (Signed)
Benefits from viagra, prescribed through the [place]

## 2012-11-13 NOTE — Progress Notes (Signed)
  Subjective:    Patient ID: Evan Jacobson, male    DOB: 02-02-1965, 47 y.o.   MRN: [id number]  HPI The PT is here for follow up and re-evaluation of chronic medical conditions, medication management and review of any available recent lab and radiology data.  Preventive health is updated, specifically  Cancer screening and Immunization.   He continues to be involved in group therapy through the [place] for PTSD and is making progress, though he is till bothered by nightmares. Still not yet involved in the community as he would like, but he feels he is at a point where he can do more. 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. The PT denies any adverse reactions to current medications since the last visit.  Chronic back pain is unchanged, willing to probably start swimming, has a [employer], will need to learn, but the benefit for mental health and chronic back pain is worth it  There are no new concerns.  There are no specific complaints except that he wanted alcohol dependence removed from current problem list, after discussion , he did agree that he had depended on alcohol to some extent in the past , but now no more      Review of Systems See HPI Denies recent fever or chills. Denies sinus pressure, nasal congestion, ear pain or sore throat.Allergies controlled with daily meds Denies chest congestion, productive cough or wheezing. Denies chest pains, palpitations and leg swelling Denies abdominal pain, nausea, vomiting,diarrhea or constipation.   Denies dysuria, frequency, hesitancy or incontinence.  Denies headaches, seizures, numbness, or tingling. Denies  Uncontrolled depression, anxiety or insomnia. Denies skin break down or rash.        Objective:   Physical Exam  Patient alert and oriented and in no cardiopulmonary distress.  HEENT: No facial asymmetry, EOMI, no sinus tenderness,  oropharynx pink and moist.  Neck supple no adenopathy.  Chest: Clear to  auscultation bilaterally.  CVS: S1, S2 no murmurs, no S3.  ABD: Soft non tender. Bowel sounds normal.  Ext: No edema  MS: Adequate though reduced  ROM spine,adequate in  shoulders, hips and knees.  Skin: Intact, no ulcerations or rash noted.  Psych: Good eye contact, blunted  affect. Memory intact not anxious or depressed appearing.  CNS: CN 2-12 intact, power, tone and sensation normal throughout.       Assessment & Plan:

## 2012-12-07 ENCOUNTER — Other Ambulatory Visit: Payer: Self-pay

## 2012-12-07 MED ORDER — HYDROCODONE-ACETAMINOPHEN 10-325 MG PO TABS: 1.0000 | ORAL_TABLET | Freq: Two times a day (BID) | ORAL | Status: DC | PRN

## 2012-12-28 ENCOUNTER — Encounter: Payer: 59 | Admitting: Family Medicine

## 2012-12-29 ENCOUNTER — Other Ambulatory Visit: Payer: Self-pay

## 2012-12-29 MED ORDER — HYDROCODONE-ACETAMINOPHEN 10-325 MG PO TABS: 1.0000 | ORAL_TABLET | Freq: Two times a day (BID) | ORAL | Status: DC | PRN

## 2013-01-24 ENCOUNTER — Other Ambulatory Visit: Payer: Self-pay

## 2013-01-24 MED ORDER — HYDROCODONE-ACETAMINOPHEN 10-325 MG PO TABS: 1.0000 | ORAL_TABLET | Freq: Two times a day (BID) | ORAL | Status: DC | PRN

## 2013-02-18 ENCOUNTER — Telehealth: Payer: Self-pay

## 2013-02-21 NOTE — Telephone Encounter (Signed)
Noted, I assume he will call back if still concerned, and will need  OV which is the routine for that

## 2013-02-21 NOTE — Telephone Encounter (Signed)
Patient never came in for blood pressure check.

## 2013-02-28 ENCOUNTER — Other Ambulatory Visit: Payer: Self-pay

## 2013-02-28 MED ORDER — HYDROCODONE-ACETAMINOPHEN 10-325 MG PO TABS: 1.0000 | ORAL_TABLET | Freq: Two times a day (BID) | ORAL | Status: DC | PRN

## 2013-03-07 ENCOUNTER — Ambulatory Visit (INDEPENDENT_AMBULATORY_CARE_PROVIDER_SITE_OTHER): Payer: 59

## 2013-03-07 VITALS — BP 132/78 | HR 84 | Resp 18

## 2013-03-07 DIAGNOSIS — I10 Essential (primary) hypertension: Secondary | ICD-10-CM

## 2013-03-07 NOTE — Progress Notes (Signed)
Patient in to collect monthly controlled substance prescription.  Asking if blood pressure can be checked.  Blood pressure checked manually in left arm.  Patient advised to continue prescribed meds and keep next followup appt for 2/25

## 2013-03-23 ENCOUNTER — Encounter: Payer: Self-pay | Admitting: Family Medicine

## 2013-03-23 ENCOUNTER — Other Ambulatory Visit: Payer: Self-pay

## 2013-03-23 ENCOUNTER — Ambulatory Visit (HOSPITAL_COMMUNITY)
Admission: RE | Admit: 2013-03-23 | Discharge: 2013-03-23 | Disposition: A | Discharge status: Home / Self Care | Payer: 59 | Source: Ambulatory Visit | Attending: Family Medicine | Admitting: Family Medicine

## 2013-03-23 ENCOUNTER — Ambulatory Visit (INDEPENDENT_AMBULATORY_CARE_PROVIDER_SITE_OTHER): Payer: 59 | Admitting: Family Medicine

## 2013-03-23 VITALS — BP 126/90 | HR 88 | Resp 18 | Ht 71.0 in | Wt 201.1 lb

## 2013-03-23 DIAGNOSIS — R51 Headache: Secondary | ICD-10-CM

## 2013-03-23 DIAGNOSIS — Z1211 Encounter for screening for malignant neoplasm of colon: Secondary | ICD-10-CM

## 2013-03-23 DIAGNOSIS — Z0000 Encounter for general adult medical examination without abnormal findings: Secondary | ICD-10-CM

## 2013-03-23 DIAGNOSIS — M62838 Other muscle spasm: Secondary | ICD-10-CM

## 2013-03-23 DIAGNOSIS — R519 Headache, unspecified: Secondary | ICD-10-CM | POA: Insufficient documentation

## 2013-03-23 DIAGNOSIS — D229 Melanocytic nevi, unspecified: Secondary | ICD-10-CM

## 2013-03-23 DIAGNOSIS — N451 Epididymitis: Secondary | ICD-10-CM | POA: Insufficient documentation

## 2013-03-23 DIAGNOSIS — D239 Other benign neoplasm of skin, unspecified: Secondary | ICD-10-CM

## 2013-03-23 DIAGNOSIS — J309 Allergic rhinitis, unspecified: Secondary | ICD-10-CM

## 2013-03-23 DIAGNOSIS — N453 Epididymo-orchitis: Secondary | ICD-10-CM

## 2013-03-23 DIAGNOSIS — Z1212 Encounter for screening for malignant neoplasm of rectum: Secondary | ICD-10-CM

## 2013-03-23 DIAGNOSIS — I10 Essential (primary) hypertension: Secondary | ICD-10-CM

## 2013-03-23 LAB — POC HEMOCCULT BLD/STL (OFFICE/1-CARD/DIAGNOSTIC): Fecal Occult Blood, POC: NEGATIVE

## 2013-03-23 MED ORDER — METHYLPREDNISOLONE ACETATE 80 MG/ML IJ SUSP
80.0000 mg | Freq: Once | INTRAMUSCULAR | Status: AC
  Administered 2013-03-23: 80 mg via INTRAMUSCULAR

## 2013-03-23 MED ORDER — PREDNISONE 5 MG PO TABS: 5.0000 mg | ORAL_TABLET | Freq: Two times a day (BID) | ORAL | Status: AC

## 2013-03-23 MED ORDER — HYDROCODONE-ACETAMINOPHEN 10-325 MG PO TABS: 1.0000 | ORAL_TABLET | Freq: Two times a day (BID) | ORAL | Status: DC | PRN

## 2013-03-23 MED ORDER — CEPHALEXIN 500 MG PO CAPS: 500.0000 mg | ORAL_CAPSULE | Freq: Three times a day (TID) | ORAL | Status: DC

## 2013-03-23 NOTE — Progress Notes (Signed)
Subjective:    Patient ID: Evan Jacobson, male    DOB: July 05, 1965, 48 y.o.   MRN: [id number]  HPI Pt in for annual exam, States a mole on right lower abdomen has increased in size , getting darker and itches so he wants this removed. 2 weeks ago after shooting hoops, he experienced  increased low back pain, radiating to both inguinal regions experienced left testicular pain and swelling which was severe lasted about 1 week and testicle was swollen, swelling still present 1 week h/o supraorbital headache conastant, also post head ache with left neck spasm after sleeping on a sofa  Has in the past been on immunotherapy, no longer does this, states his "head is always in a barrel" Improved mental health, now becoming involved in [place] activities,  Hence community, not just sitting on a bench in [place]    See HPI Denies recent fever or chills. Denies sinus pressure, nasal congestion, ear pain or sore throat. Denies chest congestion, productive cough or wheezing. Denies chest pains, palpitations and leg swelling Denies abdominal pain, nausea, vomiting,diarrhea or constipation.   Denies dysuria, frequency, hesitancy or incontinence.  Denies , seizures, numbness, or tingling. Denies uncontrolled  depression, anxiety or insomnia. Denies skin break down or rash.     Objective:   Physical Exam  Pleasant well nourished male, alert and oriented x 3, in no cardio-pulmonary distress.Voice is nasal Afebrile. HEENT No facial trauma or asymetry. Sinuses non tender.Reports pressure in supraorbital areas   EOMI, PERTL, fundoscopic exam is negative for hemorhages or exudates. External ears normal, tympanic membranes clear. Oropharynx moist, no exudate, good dentition. Neck: left trapezius spasm with reduced ROM, no adenopathy,JVD or thyromegaly.No bruits.  Chest: Clear to ascultation bilaterally.No crackles or wheezes. Non tender to palpation  Breast: No  asymetry,no masses. No nipple discharge or inversion. No axillary or supraclavicular adenopathy  Cardiovascular system; Heart sounds normal,  S1 and  S2 ,no S3.  No murmur, or thrill. Apical beat not displaced Peripheral pulses normal.  Abdomen: Soft, non tender, no organomegaly or masses. No bruits. Bowel sounds normal. No guarding, tenderness or rebound.  Rectal:  No mass. guaiac negative stool. Prostate smooth and firm  GU: No penile lesion or discharge.No inguinal hernias Left testicle swollen and tender, no redness or warmth of scrotal skin, right testicle normal on exam.  Musculoskeletal exam: Full ROM of spine, hips , shoulders and knees. No deformity ,swelling or crepitus noted. No muscle wasting or atrophy.   Neurologic: Cranial nerves 2 to 12 intact. Power, tone ,sensation and reflexes normal throughout. No disturbance in gait. No tremor.  Skin: Intact, no ulceration, erythema , scaling or rash noted. Pigmentation normal throughout. Benign nappearing hyperpigmented  nevus on right lower anterior abdominal wall, max diameter approx 1.5 cm  Psych; Normal mood and affect. Judgement and concentration normal        Assessment & Plan:  Epididymitis, left Tender swollen left testicle following recent h/o lumbar pain, antibiotc course prescribed, pt to call back if symptom of swelling and tenderness persists , he will be referred to urologist at that time  Neck muscle spasm Left trapezius spasm, dep medrol and short course of prednisone.Pt to use his cylobenzaprin at bedtime, and compresses for 1 week, he will check into access [facility] and get [facility] in touch  ESSENTIAL HYPERTENSION Persistently elevated diastolic blood pressure, with normal systolic DASH diet and commitment to daily physical  activity for a minimum of 30 minutes discussed and encouraged, as a part of hypertension management. The importance of  attaining a healthy weight is also discussed. No change in med dose, unsure that sytstolic blood pressure can be lowered without pt becoming symptomatic  ALLERGIC RHINITIS Uncontrolled and likely contributing in a major way to his headache. Steroids IM and orally, and pt to commit to daily nasal steroid and allergy pill also flushes with saline X ray of sinuses  Benign mole Lower abdominal nevus right anterior abdominal wall, refer to derm for removal  Headache(784.0) No neurologic deficits on exam Likely due to sinus congestion and pressure also left trapezius spasm. Tylenol and depo medrol in office  Routine general medical examination at a health care facility Annual exam as documented. Counseling done  re healthy lifestyle involving commitment to 150 minutes exercise per week, heart healthy diet, and attaining healthy weight.The importance of adequate sleep also discussed. Regular seat belt use and safe storage  of firearms if patient has them, is also discussed. Changes in health habits are decided on by the patient with goals and time frames  set for achieving them. Immunization and cancer screening needs are specifically addressed at this visit.

## 2013-03-23 NOTE — Patient Instructions (Addendum)
F/u in 4 month, call if you need me before  Sinus xray today please  Depo medrol 80 mg followed by 5 day prednisone for frontal headache which I believe is due to uncontrolled allergies and 2 tylenol tabs in the offuice  Keflex x 1 week for inflamed left testicle , call back if persists for referral to urology  You are referred to dermatology to remove mole on right lower  Abdomen  For left neck spasm and pain, ibuprofen 1 daily , one cyclobenzaprine at night for 7 to 10 days, call if you are  Abl;e to get PT locally , you will be referred  Use astelin spray and claritin EVERY day for allergies.  Blood pressure needs to improve, commit tooexercise for 30 mins every day this will help, no change in med, also work on additional weight loss  Fasting labs needed

## 2013-03-24 ENCOUNTER — Encounter: Payer: Self-pay | Admitting: Family Medicine

## 2013-03-24 NOTE — Assessment & Plan Note (Signed)
Lower abdominal nevus right anterior abdominal wall, refer to derm for removal

## 2013-03-24 NOTE — Assessment & Plan Note (Signed)
Persistently elevated diastolic blood pressure, with normal systolic DASH diet and commitment to daily physical activity for a minimum of 30 minutes discussed and encouraged, as a part of hypertension management. The importance of attaining a healthy weight is also discussed. No change in med dose, unsure that sytstolic blood pressure can be lowered without pt becoming symptomatic

## 2013-03-24 NOTE — Assessment & Plan Note (Signed)
Left trapezius spasm, dep medrol and short course of prednisone.Pt to use his cylobenzaprin at bedtime, and compresses for 1 week, he will check [facility] and get [facility] in touch

## 2013-03-24 NOTE — Assessment & Plan Note (Signed)
Annual exam as documented. Counseling done  re healthy lifestyle involving commitment to 150 minutes exercise per week, heart healthy diet, and attaining healthy weight.The importance of adequate sleep also discussed. Regular seat belt use and safe storage  of firearms if patient has them, is also discussed. Changes in health habits are decided on by the patient with goals and time frames  set for achieving them. Immunization and cancer screening needs are specifically addressed at this visit.  

## 2013-03-24 NOTE — Assessment & Plan Note (Signed)
No neurologic deficits on exam Likely due to sinus congestion and pressure also left trapezius spasm. Tylenol and depo medrol in office

## 2013-03-24 NOTE — Assessment & Plan Note (Signed)
Uncontrolled and likely contributing in a major way to his headache. Steroids IM and orally, and pt to commit to daily nasal steroid and allergy pill also flushes with saline X ray of sinuses

## 2013-03-24 NOTE — Assessment & Plan Note (Signed)
Tender swollen left testicle following recent h/o lumbar pain, antibiotc course prescribed, pt to call back if symptom of swelling and tenderness persists , he will be referred to urologist at that time

## 2013-03-28 ENCOUNTER — Encounter: Payer: Self-pay | Admitting: Family Medicine

## 2013-03-28 LAB — CBC WITH DIFFERENTIAL/PLATELET
Basophils Absolute: 0.1 10*3/uL (ref 0.0–0.1)
Basophils Relative: 1 % (ref 0–1)
Eosinophils Absolute: 0.6 10*3/uL (ref 0.0–0.7)
Eosinophils Relative: 15 % — ABNORMAL HIGH (ref 0–5)
HCT: 43.4 % (ref 39.0–52.0)
Hemoglobin: 15 g/dL (ref 13.0–17.0)
Lymphocytes Relative: 40 % (ref 12–46)
Lymphs Abs: 1.6 10*3/uL (ref 0.7–4.0)
MCH: 29.6 pg (ref 26.0–34.0)
MCHC: 34.6 g/dL (ref 30.0–36.0)
MCV: 85.6 fL (ref 78.0–100.0)
Monocytes Absolute: 0.3 10*3/uL (ref 0.1–1.0)
Monocytes Relative: 6 % (ref 3–12)
Neutro Abs: 1.6 10*3/uL — ABNORMAL LOW (ref 1.7–7.7)
Neutrophils Relative %: 38 % — ABNORMAL LOW (ref 43–77)
Platelets: 240 10*3/uL (ref 150–400)
RBC: 5.07 MIL/uL (ref 4.22–5.81)
RDW: 13.1 % (ref 11.5–15.5)
WBC: 4.1 10*3/uL (ref 4.0–10.5)

## 2013-03-28 LAB — LIPID PANEL
Cholesterol: 172 mg/dL (ref 0–200)
HDL: 52 mg/dL (ref >39)
LDL Cholesterol: 111 mg/dL — ABNORMAL HIGH (ref 0–99)
Total CHOL/HDL Ratio: 3.3 Ratio
Triglycerides: 47 mg/dL (ref <150)
VLDL: 9 mg/dL (ref 0–40)

## 2013-03-28 LAB — BASIC METABOLIC PANEL
BUN: 16 mg/dL (ref 6–23)
CO2: 26 mEq/L (ref 19–32)
Calcium: 9 mg/dL (ref 8.4–10.5)
Chloride: 105 mEq/L (ref 96–112)
Creat: 1.15 mg/dL (ref 0.50–1.35)
Glucose, Bld: 85 mg/dL (ref 70–99)
Potassium: 4.1 mEq/L (ref 3.5–5.3)
Sodium: 141 mEq/L (ref 135–145)

## 2013-03-28 LAB — TSH: TSH: 1.31 u[IU]/mL (ref 0.350–4.500)

## 2013-03-28 LAB — HEMOGLOBIN A1C
Hgb A1c MFr Bld: 5.5 % (ref <5.7)
Mean Plasma Glucose: 111 mg/dL (ref <117)

## 2013-03-28 LAB — PSA: PSA: 0.89 ng/mL (ref <=4.00)

## 2013-04-18 ENCOUNTER — Other Ambulatory Visit: Payer: Self-pay

## 2013-04-18 MED ORDER — HYDROCODONE-ACETAMINOPHEN 10-325 MG PO TABS: 1.0000 | ORAL_TABLET | Freq: Two times a day (BID) | ORAL | Status: DC | PRN

## 2013-05-23 ENCOUNTER — Telehealth: Payer: Self-pay | Admitting: Family Medicine

## 2013-05-24 NOTE — Telephone Encounter (Signed)
Called patient and left message for them to return call at the office   

## 2013-05-24 NOTE — Telephone Encounter (Signed)
Letter needs to be written to: [facility] They are telling patient he must provide letter stating that he is confined to bed 5-6 days per month due to back pain. Patient advised to call back to office Thursday to check on status of this.

## 2013-05-25 NOTE — Telephone Encounter (Signed)
Letter typed and put in box

## 2013-05-25 NOTE — Telephone Encounter (Signed)
Please type a letter stating patient has a longstanding history of disabling  back pain. He reports that for on average 5 to 7 days per month the pain is so severe that he is bed confined. (Use the usual format, pls , I will sign the letter)

## 2013-05-30 ENCOUNTER — Telehealth: Payer: Self-pay | Admitting: Family Medicine

## 2013-05-31 NOTE — Telephone Encounter (Signed)
Attempted to return call on home and cell #'s no response, no message left

## 2013-06-01 ENCOUNTER — Telehealth: Payer: Self-pay | Admitting: Family Medicine

## 2013-06-01 NOTE — Telephone Encounter (Signed)
[facility] said the [facility] written wasn't sufficient and if turned in his disability benefits would be cut. He gave it back to patient and said in order to keep his benefits the letter needs to state that per Dr "he IS confined to the bed 5-7 days per month not "patient reports" he is confined to bed. Told patient I would pass the message along.

## 2013-06-02 ENCOUNTER — Other Ambulatory Visit: Payer: Self-pay

## 2013-06-02 DIAGNOSIS — M549 Dorsalgia, unspecified: Secondary | ICD-10-CM

## 2013-06-02 MED ORDER — HYDROCODONE-ACETAMINOPHEN 10-325 MG PO TABS: 1.0000 | ORAL_TABLET | Freq: Two times a day (BID) | ORAL | Status: DC | PRN

## 2013-06-02 NOTE — Telephone Encounter (Signed)
I see note . Thisd will be a part of patient record in history , bed confined for 5 to 7 days per week due to back pain. Pt makes the point that since not workingf does not come in for work note at those times. Mental health reasons make him unable to work as well Pls re type letter stating Evan Jacobson is bed confined for 5 to 7 days per month due to back pain, I will sign

## 2013-06-02 NOTE — Telephone Encounter (Signed)
Patient aware its ready for pickup

## 2013-06-21 ENCOUNTER — Telehealth: Payer: Self-pay

## 2013-06-21 NOTE — Telephone Encounter (Signed)
Pls let pt know that the ONLY statement that i am able to write would styate the exact truth as I know it , with NO variation. It will state that Evan Jacobson reports being bedridden on the dates he has called in, absolutely NO variation.  Unless someone is in a medically supervised setting, like the hospital or a facility, I am INCAPABLE, UNABLE/ and WILL NOT document this any other way.  He may consider being followed by orthopedic Doc re this severe /disabling back, not sure if that will help his case , but is something he may consider.  As his PCP ,I am unable to do anything other than what I have stated

## 2013-06-22 NOTE — Telephone Encounter (Signed)
Called and spoke with patient.  He is aware of advise and the decision of md.  He is adimit about being seen and speaking with md.  appt made.

## 2013-06-23 ENCOUNTER — Other Ambulatory Visit: Payer: Self-pay

## 2013-06-23 ENCOUNTER — Ambulatory Visit: Payer: 59 | Admitting: Family Medicine

## 2013-06-23 ENCOUNTER — Encounter: Payer: Self-pay | Admitting: Family Medicine

## 2013-06-23 VITALS — BP 102/68 | HR 82 | Resp 18 | Wt 198.0 lb

## 2013-06-23 DIAGNOSIS — Z719 Counseling, unspecified: Secondary | ICD-10-CM

## 2013-06-23 MED ORDER — HYDROCODONE-ACETAMINOPHEN 10-325 MG PO TABS: 1.0000 | ORAL_TABLET | Freq: Two times a day (BID) | ORAL | Status: DC | PRN

## 2013-06-26 DIAGNOSIS — Z719 Counseling, unspecified: Secondary | ICD-10-CM | POA: Insufficient documentation

## 2013-06-26 NOTE — Progress Notes (Signed)
   Subjective:    Patient ID: Evan Jacobson, male    DOB: 1966-01-24, 48 y.o.   MRN: [id number]  HPI Pt scheduled an appointment to hear "face to face" what I had already made clear by telephone message , that I am unable to state that he is "bed confined" for days due to back pain, which is what he requests so that he can continue VA benefits at the rate which he currently is.Pt has significant PTSD and is being treated by  Psychiatrist, however , he states that is "still being worked on" I advised that I am unable to medically document what he requests , and that I will need to seek legal counsel that may be beneficial to him. I further advised that I will have his co pay credited/returned as I did not feel as if the visit was medically necessary, he stated 'no that's OK" however I still intend to not charge for this visit , and also to have his copay  Credited/returned  Review of Systems     Objective:   Physical Exam        Assessment & Plan:

## 2013-06-30 ENCOUNTER — Telehealth: Payer: Self-pay | Admitting: Family Medicine

## 2013-07-01 ENCOUNTER — Ambulatory Visit: Payer: 59 | Admitting: Family Medicine

## 2013-07-04 NOTE — Telephone Encounter (Signed)
noted 

## 2013-07-13 ENCOUNTER — Other Ambulatory Visit: Payer: Self-pay

## 2013-07-13 MED ORDER — HYDROCODONE-ACETAMINOPHEN 10-325 MG PO TABS: 1.0000 | ORAL_TABLET | Freq: Two times a day (BID) | ORAL | Status: DC | PRN

## 2013-07-28 ENCOUNTER — Encounter (HOSPITAL_COMMUNITY): Payer: Self-pay | Admitting: Emergency Medicine

## 2013-07-28 ENCOUNTER — Emergency Department (HOSPITAL_COMMUNITY)
Admission: EM | Admit: 2013-07-28 | Discharge: 2013-07-29 | Disposition: A | Discharge status: Other Acute Inpatient Hospital | Payer: 59 | Attending: Emergency Medicine | Admitting: Emergency Medicine

## 2013-07-28 DIAGNOSIS — R45851 Suicidal ideations: Secondary | ICD-10-CM | POA: Insufficient documentation

## 2013-07-28 DIAGNOSIS — IMO0002 Reserved for concepts with insufficient information to code with codable children: Secondary | ICD-10-CM | POA: Insufficient documentation

## 2013-07-28 DIAGNOSIS — X838XXA Intentional self-harm by other specified means, initial encounter: Secondary | ICD-10-CM

## 2013-07-28 DIAGNOSIS — Z87828 Personal history of other (healed) physical injury and trauma: Secondary | ICD-10-CM | POA: Insufficient documentation

## 2013-07-28 DIAGNOSIS — Z79899 Other long term (current) drug therapy: Secondary | ICD-10-CM | POA: Insufficient documentation

## 2013-07-28 DIAGNOSIS — Z8719 Personal history of other diseases of the digestive system: Secondary | ICD-10-CM | POA: Insufficient documentation

## 2013-07-28 DIAGNOSIS — I10 Essential (primary) hypertension: Secondary | ICD-10-CM | POA: Insufficient documentation

## 2013-07-28 LAB — CBC WITH DIFFERENTIAL/PLATELET
Basophils Absolute: 0 10*3/uL (ref 0.0–0.1)
Basophils Relative: 1 % (ref 0–1)
Eosinophils Absolute: 0.3 10*3/uL (ref 0.0–0.7)
Eosinophils Relative: 6 % — ABNORMAL HIGH (ref 0–5)
HCT: 45.2 % (ref 39.0–52.0)
Hemoglobin: 15.5 g/dL (ref 13.0–17.0)
Lymphocytes Relative: 32 % (ref 12–46)
Lymphs Abs: 1.3 10*3/uL (ref 0.7–4.0)
MCH: 30.1 pg (ref 26.0–34.0)
MCHC: 34.3 g/dL (ref 30.0–36.0)
MCV: 87.8 fL (ref 78.0–100.0)
Monocytes Absolute: 0.2 10*3/uL (ref 0.1–1.0)
Monocytes Relative: 5 % (ref 3–12)
Neutro Abs: 2.4 10*3/uL (ref 1.7–7.7)
Neutrophils Relative %: 56 % (ref 43–77)
Platelets: 199 10*3/uL (ref 150–400)
RBC: 5.15 MIL/uL (ref 4.22–5.81)
RDW: 12.6 % (ref 11.5–15.5)
WBC: 4.2 10*3/uL (ref 4.0–10.5)

## 2013-07-28 LAB — BASIC METABOLIC PANEL
Anion gap: 9 (ref 5–15)
BUN: 12 mg/dL (ref 6–23)
CO2: 29 mEq/L (ref 19–32)
Calcium: 9.4 mg/dL (ref 8.4–10.5)
Chloride: 102 mEq/L (ref 96–112)
Creatinine, Ser: 1.23 mg/dL (ref 0.50–1.35)
GFR calc Af Amer: 80 mL/min — ABNORMAL LOW (ref >90)
GFR calc non Af Amer: 69 mL/min — ABNORMAL LOW (ref >90)
Glucose, Bld: 97 mg/dL (ref 70–99)
Potassium: 4.8 mEq/L (ref 3.7–5.3)
Sodium: 140 mEq/L (ref 137–147)

## 2013-07-28 LAB — URINALYSIS, ROUTINE W REFLEX MICROSCOPIC
Bilirubin Urine: NEGATIVE
Glucose, UA: NEGATIVE mg/dL
Ketones, ur: NEGATIVE mg/dL
Leukocytes, UA: NEGATIVE
Nitrite: NEGATIVE
Protein, ur: NEGATIVE mg/dL
Specific Gravity, Urine: 1.015 (ref 1.005–1.030)
Urobilinogen, UA: 0.2 mg/dL (ref 0.0–1.0)
pH: 6 (ref 5.0–8.0)

## 2013-07-28 LAB — RAPID URINE DRUG SCREEN, HOSP PERFORMED
Amphetamines: NOT DETECTED
Barbiturates: NOT DETECTED
Benzodiazepines: NOT DETECTED
Cocaine: NOT DETECTED
Opiates: NOT DETECTED
Tetrahydrocannabinol: NOT DETECTED

## 2013-07-28 LAB — URINE MICROSCOPIC-ADD ON

## 2013-07-28 LAB — ETHANOL: Alcohol, Ethyl (B): 89 mg/dL — ABNORMAL HIGH (ref 0–11)
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
## 2013-07-28 NOTE — ED Notes (Signed)
MD at bedside. 
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
## 2013-07-28 NOTE — BH Assessment (Signed)
Assessment complete. Clayborne Dana, [facility], confirmed bed availability. Gave clinical report to Serena Colonel, NP who accepted Pt to the service of Dr. Joselyn Glassman, room 506-1. Spoke with Dr. Fredia Sorrow who said he would speak to Pt about signing voluntary or he would IVC Pt.  Orpah Greek Rosana Hoes, [facility] Triage Specialist [phone]
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
## 2013-07-29 ENCOUNTER — Inpatient Hospital Stay (HOSPITAL_COMMUNITY)
Admission: AD | Admit: 2013-07-29 | Discharge: 2013-07-31 | DRG: 882 | Disposition: A | Discharge status: Home / Self Care | Payer: 59 | Source: Intra-hospital | Attending: Psychiatry | Admitting: Psychiatry

## 2013-07-29 ENCOUNTER — Encounter (HOSPITAL_COMMUNITY): Payer: Self-pay

## 2013-07-29 DIAGNOSIS — K219 Gastro-esophageal reflux disease without esophagitis: Secondary | ICD-10-CM | POA: Diagnosis present

## 2013-07-29 DIAGNOSIS — Z8249 Family history of ischemic heart disease and other diseases of the circulatory system: Secondary | ICD-10-CM

## 2013-07-29 DIAGNOSIS — R45851 Suicidal ideations: Secondary | ICD-10-CM

## 2013-07-29 DIAGNOSIS — F4310 Post-traumatic stress disorder, unspecified: Principal | ICD-10-CM | POA: Diagnosis present

## 2013-07-29 DIAGNOSIS — F332 Major depressive disorder, recurrent severe without psychotic features: Secondary | ICD-10-CM | POA: Diagnosis present

## 2013-07-29 DIAGNOSIS — Z823 Family history of stroke: Secondary | ICD-10-CM

## 2013-07-29 DIAGNOSIS — F528 Other sexual dysfunction not due to a substance or known physiological condition: Secondary | ICD-10-CM

## 2013-07-29 DIAGNOSIS — Z5989 Other problems related to housing and economic circumstances: Secondary | ICD-10-CM

## 2013-07-29 DIAGNOSIS — F3160 Bipolar disorder, current episode mixed, unspecified: Secondary | ICD-10-CM

## 2013-07-29 DIAGNOSIS — F32A Depression, unspecified: Secondary | ICD-10-CM

## 2013-07-29 DIAGNOSIS — M549 Dorsalgia, unspecified: Secondary | ICD-10-CM | POA: Diagnosis present

## 2013-07-29 DIAGNOSIS — F329 Major depressive disorder, single episode, unspecified: Secondary | ICD-10-CM

## 2013-07-29 DIAGNOSIS — Z598 Other problems related to housing and economic circumstances: Secondary | ICD-10-CM

## 2013-07-29 DIAGNOSIS — Z5987 Material hardship due to limited financial resources, not elsewhere classified: Secondary | ICD-10-CM

## 2013-07-29 DIAGNOSIS — G8929 Other chronic pain: Secondary | ICD-10-CM | POA: Diagnosis present

## 2013-07-29 DIAGNOSIS — F3176 Bipolar disorder, in full remission, most recent episode depressed: Secondary | ICD-10-CM

## 2013-07-29 DIAGNOSIS — F411 Generalized anxiety disorder: Secondary | ICD-10-CM | POA: Diagnosis present

## 2013-07-29 DIAGNOSIS — I10 Essential (primary) hypertension: Secondary | ICD-10-CM | POA: Diagnosis present

## 2013-07-29 MED ORDER — FLUOXETINE HCL 20 MG PO CAPS
20.0000 mg | ORAL_CAPSULE | Freq: Every day | ORAL | Status: DC
  Administered 2013-07-29 – 2013-07-31 (×3): 20 mg via ORAL
  Filled 2013-07-29 (×6): qty 1

## 2013-07-29 MED ORDER — LISINOPRIL 5 MG PO TABS
5.0000 mg | ORAL_TABLET | Freq: Every day | ORAL | Status: DC
  Administered 2013-07-29 – 2013-07-31 (×3): 5 mg via ORAL
  Filled 2013-07-29 (×5): qty 1

## 2013-07-29 MED ORDER — ACETAMINOPHEN 325 MG PO TABS: 650.0000 mg | ORAL_TABLET | Freq: Four times a day (QID) | ORAL | Status: DC | PRN

## 2013-07-29 MED ORDER — ALUM & MAG HYDROXIDE-SIMETH 200-200-20 MG/5ML PO SUSP: 30.0000 mL | ORAL | Status: DC | PRN

## 2013-07-29 MED ORDER — LORATADINE 10 MG PO TABS
10.0000 mg | ORAL_TABLET | Freq: Every day | ORAL | Status: DC
  Administered 2013-07-29 – 2013-07-31 (×3): 10 mg via ORAL
  Filled 2013-07-29 (×5): qty 1

## 2013-07-29 MED ORDER — MAGNESIUM HYDROXIDE 400 MG/5ML PO SUSP: 30.0000 mL | Freq: Every day | ORAL | Status: DC | PRN

## 2013-07-29 NOTE — Progress Notes (Signed)
D: Pt denies SI/HI/AVH. Pt is pleasant and cooperative. Pt continues to appear fine. Pt stated he and his wife had a disagreement, that could have been worked out by him not coming here. Pt has bright affect, does not appear to be depressed, and does not have animosity towards his wife.   A: Pt was offered support and encouragement. Pt was given scheduled medications. Pt was encourage to attend groups. Q 15 minute checks were done for safety.   R:Pt attends groups and interacts well with peers and staff. Pt is taking medication. Pt has no complaints at this time.Pt receptive to treatment and safety maintained on unit.

## 2013-07-29 NOTE — BHH Suicide Risk Assessment (Signed)
   Nursing information obtained from:    Demographic factors:   48 year old married male Current Mental Status:    Loss Factors:    Historical Factors:    Risk Reduction Factors:    Total Time spent with patient: 45 minutes  CLINICAL FACTORS:   Depression:   Impulsivity  Psychiatric Specialty Exam: Physical Exam  ROS  Blood pressure 138/100, pulse 80, temperature 97.7 F (36.5 C), temperature source Oral, resp. rate 18, height 5\' 11"  (1.803 m), weight 83.008 kg (183 lb).Body mass index is 25.53 kg/(m^2).  General Appearance: Well Groomed  Engineer, water::  Good  Speech:  Normal Rate  Volume:  Normal  Mood:  Anxious and denies depression  Affect:  Appropriate  Thought Process:  Goal Directed and Linear  Orientation:  Full (Time, Place, and Person)  Thought Content:  denies hallucinations, no delusions expressed, does not appear internally preoccupied   Suicidal Thoughts:  No- denies any thoughts of hurting himself   Homicidal Thoughts:  No- also, specifically denies any violent ideations towards wife or anyone else   Memory:  Negative  Judgement:  Fair  Insight:  Fair  Psychomotor Activity:  Normal  Concentration:  Good  Recall:  Good  Fund of Knowledge:Good  Language: Good  Akathisia:  No  Handed:  Right  AIMS (if indicated):     Assets:  Communication Skills Desire for Improvement Housing Social Support  Sleep:      Musculoskeletal: Strength & Muscle Tone: within normal limits Gait & Station: normal Patient leans: N/A  COGNITIVE FEATURES THAT CONTRIBUTE TO RISK:  Closed-mindedness    SUICIDE RISK:   Moderate:  Frequent suicidal ideation with limited intensity, and duration, some specificity in terms of plans, no associated intent, good self-control, limited dysphoria/symptomatology, some risk factors present, and identifiable protective factors, including available and accessible social support.  PLAN OF CARE:Patient will be admitted to inpatient psychiatric unit  for stabilization and safety. Will provide and encourage milieu participation. Provide medication management and maked adjustments as needed.  Will follow daily.    I certify that inpatient services furnished can reasonably be expected to improve the patient's condition.  Enzio Buchler, [place] 07/29/2013, 4:10 PM

## 2013-07-29 NOTE — BHH Group Notes (Signed)
[facility] LCSW Aftercare Discharge Planning Group Note   07/29/2013 10:24 AM    Participation Quality:  Appropraite  Mood/Affect:  Appropriate  Depression Rating:  1  Anxiety Rating:  1  Thoughts of Suicide:  No  Will you contract for safety?   NA  Current AVH:  No  Plan for Discharge/Comments:  Patient attended discharge planning group and actively participated in group.  He advised of following up through [facility].  CSW provided all participants with daily workbook.   Transportation Means: Patient has transportation.   Supports:  Patient has a support system.   Jazminn Pomales, [name]
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
## 2013-07-29 NOTE — Tx Team (Signed)
Initial Interdisciplinary Treatment Plan  PATIENT STRENGTHS: (choose at least two) Active sense of humor General fund of knowledge Supportive family/friends  PATIENT STRESSORS: Health problems Marital or family conflict   PROBLEM LIST: Problem List/Patient Goals Date to be addressed Date deferred Reason deferred Estimated date of resolution  Family conflict 0/2/77     Risk for suicde 07/29/13                                                DISCHARGE CRITERIA:  Improved stabilization in mood, thinking, and/or behavior Medical problems require only outpatient monitoring  PRELIMINARY DISCHARGE PLAN: Outpatient therapy  PATIENT/FAMIILY INVOLVEMENT: This treatment plan has been presented to and reviewed with the patient, Evan Jacobson.  The patient and family have been given the opportunity to ask questions and make suggestions.  Vonzella Nipple A 07/29/2013, 3:44 AM

## 2013-07-29 NOTE — Progress Notes (Addendum)
Pt requested and signed his 72 hour form today at 1000. Time will be up on 08-01-13 at 1000.

## 2013-07-29 NOTE — Progress Notes (Signed)
[facility] Notes:  (Nursing/MHT/Case Management/Adjunct)  Date:  07/29/2013  Time:  11:57 PM  Type of Therapy:  Group Therapy  Participation Level:  Did Not Attend  Participation Quality:  Did Not Attend  Affect:  Did Not Attend  Cognitive:  Did Not Attend  Insight:  None  Engagement in Group:  Did Not Attend  Modes of Intervention:  Socialization and Support  Summary of Progress/Problems: Pt. Was sleeping in bed.  Lanell Persons 07/29/2013, 11:57 PM

## 2013-07-29 NOTE — ED Notes (Signed)
Patient refuses to sign consent for transfer to Behavioral health. Charge nurse notified and she talked with patient, patient still refuses to sign consent for transport.

## 2013-07-29 NOTE — H&P (Signed)
Psychiatric Admission Assessment Adult  Patient Identification:  Evan Jacobson Date of Evaluation:  07/29/2013 Chief Complaint:  POST TRAUMATIC STRESS DISORDER History of Present Illness:: Evan Jacobson is an 48 y.o. male, married, African-American who presents to [facility] ED accompanied by [place] voluntarily. Pt reports he and his wife had a conflict tonight and he went into their bedroom, locked the door, turned on the television and would not come out when wife asked. He reports he drank "two shots of [employer]" and then when he picked up the telephone and heard wife on the phone with 911 he left the house. He reports he was picked up by [facility] who insisted he accompany them to [facility] ED. Pt states he is frustrated and embarrassed that he was put in handcuffs and he is not happy that he has waited two hours. He feels there is no reason for him to have been picked up.  Law enforcement told Dr. Fredia Sorrow that Pt's wife reports that Pt was threatening suicide and she has a picture from tonight of Pt with a shotgun under his chin. When confronted with this Pt calmly states "I don't remember that." Pt acknowledges having had suicidal thoughts both in the past and tonight but will not confirm or deny putting a shotgun to his head. He reports a history of one previous suicide attempt in 1995 by overdose and states he was hospitalized at [facility]. Pt states he has a history of PTSD related to combat experience in [place] in 1988 while serving in the [employer]. Pt denies any depressive symptoms and says his mood has been "good."   Elements:  Location:  [facility]. Quality:  Increased by alcohol. Severity:  Mild. Timing:  1 day. Duration:  Acute. Context:  Argument with wife. Associated Signs/Synptoms: Depression Symptoms:  suicidal thoughts without plan, (Hypo) Manic Symptoms:   Anxiety Symptoms:  Excessive Worry, Psychotic Symptoms:   PTSD  Symptoms: Negative Total Time spent with patient: 30 minutes  Psychiatric Specialty Exam: Physical Exam  ROS  Blood pressure 138/100, pulse 80, temperature 97.7 F (36.5 C), temperature source Oral, resp. rate 18, height $RemoveBe'5\' 11"'[id number]$  (1.803 m), weight 83.008 kg (183 lb).Body mass index is 25.53 kg/(m^2).  General Appearance: Casual  Eye Contact::  Good  Speech:  Clear and Coherent and Normal Rate  Volume:  Normal  Mood:  Euthymic  Affect:  Appropriate and Congruent  Thought Process:  Goal Directed and Intact  Orientation:  Full (Time, Place, and Person)  Thought Content:  Rumination  Suicidal Thoughts:  No  Homicidal Thoughts:  No  Memory:  Immediate;   Good Recent;   Good Remote;   Good  Judgement:  Good  Insight:  Good  Psychomotor Activity:  Normal  Concentration:  Good  Recall:  Good  Fund of Knowledge:Good  Language: Good  Akathisia:  No  Handed:  Right  AIMS (if indicated):     Assets:  Communication Skills Desire for Improvement Financial Resources/Insurance Housing Social Support Transportation  Sleep:  Number of hours sleep 5    Musculoskeletal: Strength & Muscle Tone: within normal limits Gait & Station: normal Patient leans: N/A  Past Psychiatric History: Diagnosis: MDD, Bipolar, PTSD  Hospitalizations: [facility] in [facility],   Outpatient Care: [place]  Substance Abuse Care: None  Self-Mutilation:None  Suicidal Attempts: Overdose in 1995  Violent Behaviors: None   Past Medical History:   Past Medical History  Diagnosis Date  . Allergic rhinitis   .  GERD (gastroesophageal reflux disease)   . Hypertension   . Concussion 2012  . PTSD (post-traumatic stress disorder)   . Chronic back pain    Loss of Consciousness:  Concussion in 2011, while working Allergies:   Allergies  Allergen Reactions  . Sulfonamide Derivatives Other (See Comments)    Tongue swelled.    PTA Medications: Prescriptions prior to admission  Medication Sig Dispense  Refill  . FLUoxetine (PROZAC) 20 MG capsule Take 20 mg by mouth daily as needed (Anxiety).       Marland Kitchen HYDROcodone-acetaminophen (NORCO) 10-325 MG per tablet Take 1 tablet by mouth 2 (two) times daily as needed.  60 tablet  0  . levocetirizine (XYZAL) 5 MG tablet Take 5 mg by mouth daily.      Marland Kitchen lisinopril (PRINIVIL,ZESTRIL) 10 MG tablet Take 5 mg by mouth daily.      . meloxicam (MOBIC) 15 MG tablet Take 15 mg by mouth daily as needed for pain.       . sildenafil (VIAGRA) 100 MG tablet Take 1 tablet (100 mg total) by mouth as needed for erectile dysfunction.  4 tablet  5    Previous Psychotropic Medications:  Medication/Dose  Unable to recall               Substance Abuse History in the last 12 months:  No.  Consequences of Substance Abuse: Negative  Social History:  reports that he has never smoked. He does not have any smokeless tobacco history on file. He reports that he drinks alcohol. He reports that he does not use illicit drugs. Additional Social History:   Current Place of Residence:  [place], [place] Place of Birth:  [place], [place] Family Members: Marital Status:  Married Children:2  Sons:2  Daughters: Relationships: None Education:  [employer] Problems/Performance: None Religious Beliefs/Practices: baptist History of Abuse (Emotional/Phsycial/Sexual) None Occupational Experiences; Disable Administrator, Civil Service History:  Dispensing optician History: None Hobbies/Interests: Walking, Sports  Family History:   Family History  Problem Relation Age of Onset  . Stroke Father   . Hypertension Mother     [name]  . Hypertension Father   . Hypertension Sister   . Hypertension Sister   . Hypertension Brother   . Hypertension Brother   . Heart disease Mother     Angina    Results for orders placed during the hospital encounter of 07/28/13 (from the past 72 hour(s))  BASIC METABOLIC PANEL     Status: Abnormal   Collection Time    07/28/13  8:20 PM      Result Value  Ref Range   Sodium 140  137 - 147 mEq/L   Potassium 4.8  3.7 - 5.3 mEq/L   Chloride 102  96 - 112 mEq/L   CO2 29  19 - 32 mEq/L   Glucose, Bld 97  70 - 99 mg/dL   BUN 12  6 - 23 mg/dL   Creatinine, Ser 1.23  0.50 - 1.35 mg/dL   Calcium 9.4  8.4 - 10.5 mg/dL   GFR calc non Af Amer 69 (*) >90 mL/min   GFR calc Af Amer 80 (*) >90 mL/min   Comment: (NOTE)     The eGFR has been calculated using the CKD EPI equation.     This calculation has not been validated in all clinical situations.     eGFR's persistently <90 mL/min signify possible Chronic Kidney     Disease.   Anion gap 9  5 - 15  CBC WITH DIFFERENTIAL     Status: Abnormal   Collection Time    07/28/13  8:20 PM      Result Value Ref Range   WBC 4.2  4.0 - 10.5 K/uL   RBC 5.15  4.22 - 5.81 MIL/uL   Hemoglobin 15.5  13.0 - 17.0 g/dL   HCT 45.2  39.0 - 52.0 %   MCV 87.8  78.0 - 100.0 fL   MCH 30.1  26.0 - 34.0 pg   MCHC 34.3  30.0 - 36.0 g/dL   RDW 12.6  11.5 - 15.5 %   Platelets 199  150 - 400 K/uL   Neutrophils Relative % 56  43 - 77 %   Neutro Abs 2.4  1.7 - 7.7 K/uL   Lymphocytes Relative 32  12 - 46 %   Lymphs Abs 1.3  0.7 - 4.0 K/uL   Monocytes Relative 5  3 - 12 %   Monocytes Absolute 0.2  0.1 - 1.0 K/uL   Eosinophils Relative 6 (*) 0 - 5 %   Eosinophils Absolute 0.3  0.0 - 0.7 K/uL   Basophils Relative 1  0 - 1 %   Basophils Absolute 0.0  0.0 - 0.1 K/uL  ETHANOL     Status: Abnormal   Collection Time    07/28/13  8:20 PM      Result Value Ref Range   Alcohol, Ethyl (B) 89 (*) 0 - 11 mg/dL   Comment:            LOWEST DETECTABLE LIMIT FOR     SERUM ALCOHOL IS 11 mg/dL     FOR MEDICAL PURPOSES ONLY  URINALYSIS, ROUTINE W REFLEX MICROSCOPIC     Status: Abnormal   Collection Time    07/28/13  8:28 PM      Result Value Ref Range   Color, Urine YELLOW  YELLOW   APPearance CLEAR  CLEAR   Specific Gravity, Urine 1.015  1.005 - 1.030   pH 6.0  5.0 - 8.0   Glucose, UA NEGATIVE  NEGATIVE mg/dL   Hgb urine  dipstick TRACE (*) NEGATIVE   Bilirubin Urine NEGATIVE  NEGATIVE   Ketones, ur NEGATIVE  NEGATIVE mg/dL   Protein, ur NEGATIVE  NEGATIVE mg/dL   Urobilinogen, UA 0.2  0.0 - 1.0 mg/dL   Nitrite NEGATIVE  NEGATIVE   Leukocytes, UA NEGATIVE  NEGATIVE  URINE RAPID DRUG SCREEN (HOSP PERFORMED)     Status: None   Collection Time    07/28/13  8:28 PM      Result Value Ref Range   Opiates NONE DETECTED  NONE DETECTED   Cocaine NONE DETECTED  NONE DETECTED   Benzodiazepines NONE DETECTED  NONE DETECTED   Amphetamines NONE DETECTED  NONE DETECTED   Tetrahydrocannabinol NONE DETECTED  NONE DETECTED   Barbiturates NONE DETECTED  NONE DETECTED   Comment:            DRUG SCREEN FOR MEDICAL PURPOSES     ONLY.  IF CONFIRMATION IS NEEDED     FOR ANY PURPOSE, NOTIFY LAB     WITHIN 5 DAYS.                LOWEST DETECTABLE LIMITS     FOR URINE DRUG SCREEN     Drug Class       Cutoff (ng/mL)     Amphetamine      1000     Barbiturate      200  Benzodiazepine   654     Tricyclics       650     Opiates          300     Cocaine          300     THC              50  URINE MICROSCOPIC-ADD ON     Status: Abnormal   Collection Time    07/28/13  8:28 PM      Result Value Ref Range   Squamous Epithelial / LPF FEW (*) RARE   WBC, UA 0-2  <3 WBC/hpf   RBC / HPF 0-2  <3 RBC/hpf   Bacteria, UA RARE  RARE   Psychological Evaluations:  Assessment:   DSM5:  Schizophrenia Disorders:   Obsessive-Compulsive Disorders:   Trauma-Stressor Disorders:  Posttraumatic Stress Disorder (309.81) Substance/Addictive Disorders:   Depressive Disorders:  Major Depressive Disorder - Moderate (296.22)  AXIS I:  Bipolar, mixed, Major Depression, Recurrent severe and Post Traumatic Stress Disorder AXIS II:  Deferred AXIS III:   Past Medical History  Diagnosis Date  . Allergic rhinitis   . GERD (gastroesophageal reflux disease)   . Hypertension   . Concussion 2012  . PTSD (post-traumatic stress disorder)   .  Chronic back pain    AXIS IV:  economic problems, other psychosocial or environmental problems, problems related to social environment and problems with primary support group AXIS V:  51-60 moderate symptoms  Treatment Plan/Recommendations:   1 Admit for crisis management and stabilization. Estimated length of stay 5-7 days past his current stay of 1.  2 Individual and group therapy. 3 Medication management for depression, and anxiety to reduce current symptoms to base line and improve the overall levels of functioning: Medications reviewed with the patient and she stated no untoward effects, home medications in place.  4 Coping skills for depression and anxiety developing.  5 Continue crisis stabilization and management.  6 Address health issues- monitor vital signs, stable.  7 Treatment plan in progress to prevent relapse prevention and self care.  8 Psychosocial education regarding relapse prevention and self care 9 Heath care follow up as needed for any health concerns 10 Call for consult with hospitalist for additional specialty patient services as needed.   Treatment Plan Summary: Daily contact with patient to assess and evaluate symptoms and progress in treatment Medication management Current Medications:  Current Facility-Administered Medications  Medication Dose Route Frequency Provider Last Rate Last Dose  . acetaminophen (TYLENOL) tablet 650 mg  650 mg Oral Q6H PRN Lurena Nida, NP      . alum & mag hydroxide-simeth (MAALOX/MYLANTA) 200-200-20 MG/5ML suspension 30 mL  30 mL Oral Q4H PRN Lurena Nida, NP      . lisinopril (PRINIVIL,ZESTRIL) tablet 5 mg  5 mg Oral Daily Lurena Nida, NP   5 mg at 07/29/13 0813  . loratadine (CLARITIN) tablet 10 mg  10 mg Oral Daily Lurena Nida, NP   10 mg at 07/29/13 0813  . magnesium hydroxide (MILK OF MAGNESIA) suspension 30 mL  30 mL Oral Daily PRN Lurena Nida, NP        Observation Level/Precautions:  15 minute checks  Laboratory:   ED lab findings reviewed and assessed  Psychotherapy:  Group and Individual therapy  Medications:  See above  Consultations:  Per need  Discharge Concerns:  Safety  Estimated LOS:3-5 days  Other:  I certify that inpatient services furnished can reasonably be expected to improve the patient's condition.   Nanci Pina FNP-BC 7/3/201511:53 AM  I have reviewed NP's Note, assessement, diagnosis and plan, and agree. I have also met with patient and completed suicide risk assessment. Patient is a 48 year old man, who reportedly  Barricaded self in bedroom with shotgun in the context of argument with wife and alcohol consumption. He reports a history of military related PTSD , but states he was doing "OK" recently, and does not endorse severe or significant  PTSD or depressive symptoms over recent days. He does have a history of a suicide attempt in 95.  He is currently not endorsing any history consistent with Mania or Hypomania /Bipolar Disorder. Has been on Prozac in the past and denies having side effects.  Agrees with restarting PROZAC.   Neita Garnet, MD

## 2013-07-29 NOTE — Tx Team (Signed)
Interdisciplinary Treatment Plan Update   Date Reviewed:  07/29/2013  Time Reviewed:  8:36 AM  Progress in Treatment:   Attending groups: Yes Participating in groups: Yes Taking medication as prescribed: Yes  Tolerating medication: Yes Family/Significant other contact made:  No, but will ask patient for consent for collateral contact Patient understands diagnosis: Yes  Discussing patient identified problems/goals with staff: Yes Medical problems stabilized or resolved: Yes Denies suicidal/homicidal ideation: Yes Patient has not harmed self or others: Yes  For review of initial/current patient goals, please see plan of care.  Estimated Length of Stay:  3-5 days  Reasons for Continued Hospitalization:  Anxiety Depression Medication stabilization   New Problems/Goals identified:    Discharge Plan or Barriers:   Home with outpatient follow up to be determined  Additional Comments:  Evan Jacobson is an 48 y.o. male, married, African-American who presents to [facility] ED accompanied by [place] voluntarily. Pt reports he and his wife had a conflict tonight and he went into their bedroom, locked the door, turned on the television and would not come out when wife asked. He reports he drank "two shots of [employer]" and then when he picked up the telephone and heard wife on the phone with 911 he left the house. He reports he was picked up by [place] who insisted he accompany them to [facility] ED. Pt states he is frustrated and embarrassed that he was put in handcuffs and he is not happy that he has waited two hours. He feels there is no reason for him to have been picked up.    Attendees:  Patient:  07/29/2013 8:36 AM   Signature:  Gabriel Earing, MD 07/29/2013 8:36 AM  Signature:  07/29/2013 8:36 AM  Signature:  Eduard Roux, RN 07/29/2013 8:36 AM  Signature: Drake Leach, RN 07/29/2013 8:36 AM  Signature:  Talbert Cage RN 07/29/2013 8:36 AM  Signature:  Joette Catching, LCSW  07/29/2013 8:36 AM  Signature: 07/29/2013 8:36 AM   07/29/2013 8:36 AM   07/29/2013 8:36 AM   07/29/2013  8:36 AM   07/29/2013  8:36 AM  Signature: 07/29/2013  8:36 AM    Scribe for Treatment Team:   Joette Catching,  07/29/2013 8:36 AM

## 2013-07-29 NOTE — [facility] Group Notes (Signed)
[place] LCSW Group Therapy  Feelings Around Relapse 1:15 -2:30        07/29/2013   Type of Therapy:  Group Therapy  Participation Level:  Appropriate  Participation Quality:  Appropriate  Affect:  Appropriate  Cognitive:  Attentive Appropriate  Insight:  Developing/Improving  Engagement in Therapy: Developing/Improving  Modes of Intervention:  Discussion Exploration Problem-Solving Supportive  Summary of Progress/Problems:  The topic for today was feelings around relapse.    Patient processed feelings toward relapse and was able to relate to peers. He advised relapse for him would be engaging in negative thoughts.  Patient identified coping skills that can be used to prevent a relapse.   Concha Pyo 07/29/2013

## 2013-07-29 NOTE — [facility] Counselor (Signed)
Adult Comprehensive Assessment  Patient ID: Evan Jacobson, male   DOB: [birth year], 48 y.o.   MRN: [id number]  Information Source: Information source: Patient  Current Stressors:  Educational / Learning stressors: None Employment / Job issues: Patient is on disability Family Relationships: None Museum/gallery curator / Lack of resources (include bankruptcy): None Housing / Lack of housing: None Physical health (include injuries & life threatening diseases): Bulging disk and chronic arthritis in back Social relationships: None Substance abuse: Drinks on [employer] Bereavement / Loss: None  Living/Environment/Situation:  Living Arrangements: Spouse/significant other Living conditions (as described by patient or guardian): Good How long has patient lived in current situation?: !5 years What is atmosphere in current home: Comfortable;Loving;Supportive  Family History:  Marital status: Married Number of Years Married: 52 What types of issues is patient dealing with in the relationship?: Sometimes feels wife puts others ahead of him Additional relationship information: None Does patient have children?: Yes How many children?: 2 How is patient's relationship with their children?: Great relationship  Childhood History:  By whom was/is the patient raised?: Mother Additional childhood history information: Good Description of patient's relationship with caregiver when they were a child: Excellent Patient's description of current relationship with people who raised him/her: Mother is deceased Does patient have siblings?: Yes Number of Siblings: 2 Description of patient's current relationship with siblings: Good relationships Did patient suffer any verbal/emotional/physical/sexual abuse as a child?: No Did patient suffer from severe childhood neglect?: No Has patient ever been sexually abused/assaulted/raped as an adolescent or adult?: No Was the patient ever a victim of a crime or a  disaster?: No Witnessed domestic violence?: No Has patient been effected by domestic violence as an adult?: No  Education:  Highest grade of school patient has completed: Psychiatrist Currently a student?: No Learning disability?: No  Employment/Work Situation:   Employment situation: On disability Why is patient on disability: Medical and PTSD How long has patient been on disability: Two years Patient's job has been impacted by current illness: No What is the longest time patient has a held a job?: 3 years 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, mining Has patient ever been in the [employer]?: Yes (Describe in comment) Education officer, community) Has patient ever served in Recruitment consultant?: No  Financial Resources:   Museum/gallery curator resources: Armed forces training and education officer Does patient have a Programmer, applications or guardian?: No  Alcohol/Substance Abuse:   What has been your use of drugs/alcohol within the last 12 months?: Drinks three beers weekly If attempted suicide, did drugs/alcohol play a role in this?: No Alcohol/Substance Abuse Treatment Hx: Denies past history Has alcohol/substance abuse ever caused legal problems?: No  Social Support System:   Heritage manager System: None Describe Community Support System: N/A Type of faith/religion: Baptist How does patient's faith help to cope with current illness?: Education officer, community and Writer:   Leisure and Hobbies: Not since having back problems  Strengths/Needs:   What things does the patient do well?: Cooking In what areas does patient struggle / problems for patient: No struggles  Discharge Plan:   Does patient have access to transportation?: Yes Will patient be returning to same living situation after discharge?: Yes Currently receiving community mental health services: Yes (From Whom) ([place] and [facility]) If no, would patient like referral for services when discharged?: No Does patient have financial barriers related to  discharge medications?: No  Summary/Recommendations:  Evan Jacobson is a 48 years old African American male admitted with Major Depression Disorder.  He  will benefit from crisis stabilization, evaluation for medication, psycho-education groups for coping skills development, group therapy and case management for discharge planning.     Evan Jacobson, Evan Jacobson. 07/29/2013

## 2013-07-29 NOTE — [facility] Suicide Risk Assessment (Signed)
[facility] INPATIENT:  Family/Significant Other Suicide Prevention Education  Suicide Prevention Education:  Education Completed:  Charlann Lange, Wife, [phone]; has been identified by the patient as the family member/significant other with whom the patient will be residing, and identified as the person(s) who will aid the patient in the event of a mental health crisis (suicidal ideations/suicide attempt).  With written consent from the patient, the family member/significant other has been provided the following suicide prevention education, prior to the and/or following the discharge of the patient.  The suicide prevention education provided includes the following:  Suicide risk factors  Suicide prevention and interventions  National Suicide Hotline telephone number  [facility] assessment telephone number  [facility] Emergency Assistance [place] and/or Residential Mobile Crisis Unit telephone number  Request made of family/significant other to:  Remove weapons (e.g., guns, rifles, knives), all items previously/currently identified as safety concern.  Wife advised gun will be secured prior to patient discharging.  Remove drugs/medications (over-the-counter, prescriptions, illicit drugs), all items previously/currently identified as a safety concern.  The family member/significant other verbalizes understanding of the suicide prevention education information provided.  The family member/significant other agrees to remove the items of safety concern listed above.  Concha Pyo 07/29/2013, 12:18 PM

## 2013-07-29 NOTE — Progress Notes (Signed)
D) Pt rates his depression and hopelessness both at a 0 and denies SI and HI. Has attended the groups and interacts with his peers appropriately. Pt states "I really shouldn't be here. There is noting wrong with me. My wife and I had a misunderstanding". Has been appropriate within the milieu.  A) Given support, reassurance and praise. Encouraged to speak with the doctor about his wanting to go home and also educated that it may not be possible for him to leave today. R) Pt denies SI and HI. Signed 72 hour form at 1000 this morning

## 2013-07-30 NOTE — Progress Notes (Signed)
D) Pt has attended the groups and interacts with peers. Affect and mood are appropriate. Denies SI and HI. Pt rates his depression and hopelessness at a 0. Has shown insight in the groups and in a brief 1:1 shared he feels ready to go home. A) Given support, reassurance and praise. Encouragement given. Provided with a brief 1:1. R) Denies SI and HI.

## 2013-07-30 NOTE — [facility] Group Notes (Signed)
[facility] Group Notes:  (Nursing/MHT/Case Management/Adjunct)  Date:  07/30/2013  Time:  11:58 AM  Type of Therapy:  Psychoeducational Skills  Participation Level:  Did Not Attend  Ventura Sellers 07/30/2013, 11:58 AM
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
## 2013-07-30 NOTE — Progress Notes (Signed)
Psychoeducational Group Note  Date: 07/30/2013 Time:  1015  Group Topic/Focus:  Identifying Needs:   The focus of this group is to help patients identify their personal needs that have been historically problematic and identify healthy behaviors to address their needs.  Participation Level:  Active  Participation Quality:  Appropriate  Affect:  Appropriate  Cognitive:  Oriented  Insight:  Improving  Engagement in Group:  Engaged  Additional Comments:  Pt attended and was involved in the discussions. Added much to the group  Edu On A  

## 2013-07-31 ENCOUNTER — Encounter (HOSPITAL_COMMUNITY): Payer: Self-pay | Admitting: Registered Nurse

## 2013-07-31 DIAGNOSIS — F332 Major depressive disorder, recurrent severe without psychotic features: Secondary | ICD-10-CM

## 2013-07-31 DIAGNOSIS — F4310 Post-traumatic stress disorder, unspecified: Principal | ICD-10-CM

## 2013-07-31 MED ORDER — FLUOXETINE HCL 20 MG PO CAPS: 20.0000 mg | ORAL_CAPSULE | Freq: Every day | ORAL | Status: DC

## 2013-07-31 NOTE — Progress Notes (Signed)
Psychoeducational Group Note  Date: 07/31/2013 Time:  0930  Group Topic/Focus:  Gratefulness:  The focus of this group is to help patients identify what two things they are most grateful for in their lives. What helps ground them and to center them on their work to their recovery.  Participation Level:  Active  Participation Quality:  Appropriate  Affect:  Appropriate  Cognitive:  Oriented  Insight:  Improving  Engagement in Group:  Engaged  Additional Comments:    Evan Jacobson A  
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
## 2013-07-31 NOTE — [facility] Group Notes (Signed)
[facility] Group Notes:  (Clinical Social Work)  07/31/2013   1:15-2:15PM  Summary of Progress/Problems:  The main focus of today's process group was to   identify the patient's current support system and decide on other supports that can be put in place.  The picture on workbook was used to discuss why additional supports are needed.  An emphasis was placed on using counselor, doctor, therapy groups, 12-step groups, and problem-specific support groups to expand supports.   There was also an extensive discussion about what constitutes a healthy support versus an unhealthy support.  The patient expressed full comprehension of the concepts presented.  His motivation, he stated, was at a 10 when he arrived at the hospital and is at a 10 when he is leaving. He stated that he has a great support system in place and does not feel he needs to make any changes.  He admitted he has to find a better way to communicate, a more positive manner to ensure a more positive outcome.  He stated he was trying to get his wife's attention, and went about it in an ineffective way.  He has appreciated all he has learned in groups.  Type of Therapy:  Process Group  Participation Level:  Active  Participation Quality:  Attentive and Sharing  Affect:  Appropriate  Cognitive:  Appropriate and Oriented  Insight:  Engaged  Engagement in Therapy:  Engaged  Modes of Intervention:  Education,  Support and [employer], LCSW 07/31/2013, 4:00pm

## 2013-07-31 NOTE — Discharge Summary (Signed)
Physician Discharge Summary Note  Patient:  Evan Jacobson is an 48 y.o., male MRN:  [id number] DOB:  [birth year] Patient phone:  [phone] (home)  Patient address:   [place] [address],  Total Time spent with patient: 30 minutes  Date of Admission:  07/29/2013 Date of Discharge: 07/31/2013  Reason for Admission:   Evan Jacobson is an 48 y.o. male, married, African-American who presents to [facility] ED, after he and his wife had a conflict tonight and he went into their bedroom, locked the door, turned on the television and would not come out when wife asked. He reports he drank "two shots of [employer]" and then when he picked up the telephone and heard wife on the phone with 911 he left the house. He reports he was picked up by [place] who insisted he accompany them to [facility] ED. Pt states he is frustrated and embarrassed that he was put in handcuffs and he is not happy that he has waited two hours. He feels there is no reason for him to have been picked up.  Law enforcement told Dr. Fredia Sorrow that Pt's wife reports that Pt was threatening suicide and she has a picture from tonight of Pt with a shotgun under his chin. When confronted with this Pt calmly states "I don't remember that." Pt acknowledges having had suicidal thoughts both in the past and tonight but will not confirm or deny putting a shotgun to his head. He reports a history of one previous suicide attempt in 1995 by overdose and states he was hospitalized at [facility]. Pt states he has a history of PTSD related to combat experience in [place] in 1988 while serving in the [employer].    Discharge Evaluation:  Overall today patient is doing well. Currently rates his depression 0/10, anxiety 0/10, and hopelessness 0/10. Denies S/IHI/AVH. He has actively participating in group and milieu therapy. Behavior on the unit has been good. Upon discharge he plans to seek couple counseling so that him  and his wife may work some things out. He states his marriage is good, and still not quite sure why he is in here. He doesn't report having a memory loss or blackout, and still denies putting a gun to his head or chin as noted above. SW has spoke with his wife about safety and securing any weapons in the home, please see note.  Pt is under the care of Dr. Silvio Pate for medication management, and Cassandra at [facility] for behavioral therapy and counseling. He also utilizes [place] for mental health services, stating "it is hard to get in there at time".  DSM5:   Discharge Diagnoses: Active Problems:   Post traumatic stress disorder (PTSD)   Psychiatric Specialty Exam: Physical Exam  Constitutional: He is oriented to person, place, and time. He appears well-developed.  Neck: Normal range of motion.  Respiratory: Effort normal.  Musculoskeletal: Normal range of motion.  Neurological: He is alert and oriented to person, place, and time.  Psychiatric: He has a normal mood and affect. His behavior is normal. Judgment and thought content normal.    Review of Systems  Psychiatric/Behavioral: Negative for depression, suicidal ideas, hallucinations and substance abuse. The patient is not nervous/anxious and does not have insomnia.   All other systems reviewed and are negative.   Blood pressure 133/93, pulse 87, temperature 97.8 F (36.6 C), temperature source Oral, resp. rate 18, height 5' 11"  (1.803 m), weight 83.008 kg (  183 lb).Body mass index is 25.53 kg/(m^2).  General Appearance: Casual and Neat  Eye Contact::  Good  Speech:  Clear and Coherent and Normal Rate  Volume:  Normal  Mood:  Euthymic  Affect:  Appropriate and Congruent  Thought Process:  Goal Directed and Intact  Orientation:  Full (Time, Place, and Person)  Thought Content:  WDL  Suicidal Thoughts:  No  Homicidal Thoughts:  No  Memory:  Immediate;   Good Recent;   Good Remote;   Good  Judgement:  Good  Insight:  Good   Psychomotor Activity:  Normal  Concentration:  Good  Recall:  Good  Fund of Knowledge:Good  Language: Good  Akathisia:  No  Handed:  Right  AIMS (if indicated):     Assets:  Communication Skills Desire for Improvement Financial Resources/Insurance [place] Talents/Skills Transportation Vocational/Educational  Sleep:  Number of Hours: 6.75   Past Psychiatric History:  Diagnosis: MDD, Bipolar, PTSD   Hospitalizations: [facility] in 1995,   Outpatient Care: [facility]   Substance Abuse Care: None   Self-Mutilation:None   Suicidal Attempts: Overdose in 1995   Violent Behaviors: None     Musculoskeletal: Strength & Muscle Tone: within normal limits Gait & Station: normal Patient leans: N/A  DSM5:  Schizophrenia Disorders:   Obsessive-Compulsive Disorders:   Trauma-Stressor Disorders:  PTSD Substance/Addictive Disorders:   Depressive Disorders:  Major Depressive Disorder - Mild (296.21)  Axis Diagnosis:   AXIS I:  Major Depression, Recurrent severe and Post Traumatic Stress Disorder AXIS II:  Deferred AXIS III:   Past Medical History  Diagnosis Date  . Allergic rhinitis   . GERD (gastroesophageal reflux disease)   . Hypertension   . Concussion 2012  . PTSD (post-traumatic stress disorder)   . Chronic back pain    AXIS IV:  other psychosocial or environmental problems, problems related to social environment and problems with primary support group AXIS V:  51-60 moderate symptoms  Level of Care:  OP  Hospital Course:  Medication Adjustment, group and milieu therapy. Consulted with interdisciplinary services, for patients safety and stability at home.   Consults:  psychiatry  Significant Diagnostic Studies:  None  Discharge Vitals:   Blood pressure 133/93, pulse 87, temperature 97.8 F (36.6 C), temperature source Oral, resp. rate 18, height 5' 11"  (1.803 m), weight 83.008 kg (183 lb). Body mass index is 25.53  kg/(m^2). Lab Results:   Results for orders placed during the hospital encounter of 07/28/13 (from the past 72 hour(s))  BASIC METABOLIC PANEL     Status: Abnormal   Collection Time    07/28/13  8:20 PM      Result Value Ref Range   Sodium 140  137 - 147 mEq/L   Potassium 4.8  3.7 - 5.3 mEq/L   Chloride 102  96 - 112 mEq/L   CO2 29  19 - 32 mEq/L   Glucose, Bld 97  70 - 99 mg/dL   BUN 12  6 - 23 mg/dL   Creatinine, Ser 1.23  0.50 - 1.35 mg/dL   Calcium 9.4  8.4 - 10.5 mg/dL   GFR calc non Af Amer 69 (*) >90 mL/min   GFR calc Af Amer 80 (*) >90 mL/min   Comment: (NOTE)     The eGFR has been calculated using the CKD EPI equation.     This calculation has not been validated in all clinical situations.     eGFR's persistently <90 mL/min signify possible  Chronic Kidney     Disease.   Anion gap 9  5 - 15  CBC WITH DIFFERENTIAL     Status: Abnormal   Collection Time    07/28/13  8:20 PM      Result Value Ref Range   WBC 4.2  4.0 - 10.5 K/uL   RBC 5.15  4.22 - 5.81 MIL/uL   Hemoglobin 15.5  13.0 - 17.0 g/dL   HCT 45.2  39.0 - 52.0 %   MCV 87.8  78.0 - 100.0 fL   MCH 30.1  26.0 - 34.0 pg   MCHC 34.3  30.0 - 36.0 g/dL   RDW 12.6  11.5 - 15.5 %   Platelets 199  150 - 400 K/uL   Neutrophils Relative % 56  43 - 77 %   Neutro Abs 2.4  1.7 - 7.7 K/uL   Lymphocytes Relative 32  12 - 46 %   Lymphs Abs 1.3  0.7 - 4.0 K/uL   Monocytes Relative 5  3 - 12 %   Monocytes Absolute 0.2  0.1 - 1.0 K/uL   Eosinophils Relative 6 (*) 0 - 5 %   Eosinophils Absolute 0.3  0.0 - 0.7 K/uL   Basophils Relative 1  0 - 1 %   Basophils Absolute 0.0  0.0 - 0.1 K/uL  ETHANOL     Status: Abnormal   Collection Time    07/28/13  8:20 PM      Result Value Ref Range   Alcohol, Ethyl (B) 89 (*) 0 - 11 mg/dL   Comment:            LOWEST DETECTABLE LIMIT FOR     SERUM ALCOHOL IS 11 mg/dL     FOR MEDICAL PURPOSES ONLY  URINALYSIS, ROUTINE W REFLEX MICROSCOPIC     Status: Abnormal   Collection Time     07/28/13  8:28 PM      Result Value Ref Range   Color, Urine YELLOW  YELLOW   APPearance CLEAR  CLEAR   Specific Gravity, Urine 1.015  1.005 - 1.030   pH 6.0  5.0 - 8.0   Glucose, UA NEGATIVE  NEGATIVE mg/dL   Hgb urine dipstick TRACE (*) NEGATIVE   Bilirubin Urine NEGATIVE  NEGATIVE   Ketones, ur NEGATIVE  NEGATIVE mg/dL   Protein, ur NEGATIVE  NEGATIVE mg/dL   Urobilinogen, UA 0.2  0.0 - 1.0 mg/dL   Nitrite NEGATIVE  NEGATIVE   Leukocytes, UA NEGATIVE  NEGATIVE  URINE RAPID DRUG SCREEN (HOSP PERFORMED)     Status: None   Collection Time    07/28/13  8:28 PM      Result Value Ref Range   Opiates NONE DETECTED  NONE DETECTED   Cocaine NONE DETECTED  NONE DETECTED   Benzodiazepines NONE DETECTED  NONE DETECTED   Amphetamines NONE DETECTED  NONE DETECTED   Tetrahydrocannabinol NONE DETECTED  NONE DETECTED   Barbiturates NONE DETECTED  NONE DETECTED   Comment:            DRUG SCREEN FOR MEDICAL PURPOSES     ONLY.  IF CONFIRMATION IS NEEDED     FOR ANY PURPOSE, NOTIFY LAB     WITHIN 5 DAYS.                LOWEST DETECTABLE LIMITS     FOR URINE DRUG SCREEN     Drug Class       Cutoff (ng/mL)     Amphetamine  1000     Barbiturate      200     Benzodiazepine   409     Tricyclics       811     Opiates          300     Cocaine          300     THC              50  URINE MICROSCOPIC-ADD ON     Status: Abnormal   Collection Time    07/28/13  8:28 PM      Result Value Ref Range   Squamous Epithelial / LPF FEW (*) RARE   WBC, UA 0-2  <3 WBC/hpf   RBC / HPF 0-2  <3 RBC/hpf   Bacteria, UA RARE  RARE    Physical Findings: AIMS: Facial and Oral Movements Muscles of Facial Expression: None, normal Lips and Perioral Area: None, normal Jaw: None, normal Tongue: None, normal,Extremity Movements Upper (arms, wrists, hands, fingers): None, normal Lower (legs, knees, ankles, toes): None, normal, Trunk Movements Neck, shoulders, hips: None, normal, Overall Severity Severity of  abnormal movements (highest score from questions above): None, normal Incapacitation due to abnormal movements: None, normal Patient's awareness of abnormal movements (rate only patient's report): No Awareness, Dental Status Current problems with teeth and/or dentures?: No Does patient usually wear dentures?: No  CIWA:  CIWA-Ar Total: 0 COWS:  COWS Total Score: 1  Psychiatric Specialty Exam: See Psychiatric Specialty Exam and Suicide Risk Assessment completed by Attending Physician prior to discharge.  Discharge destination:  Home  Is patient on multiple antipsychotic therapies at discharge:  No   Has Patient had three or more failed trials of antipsychotic monotherapy by history:  No  Recommended Plan for Multiple Antipsychotic Therapies: NA     Medication List       Indication   FLUoxetine 20 MG capsule  Commonly known as:  PROZAC  Take 20 mg by mouth daily as needed (Anxiety).      FLUoxetine 20 MG capsule  Commonly known as:  PROZAC  Take 1 capsule (20 mg total) by mouth daily.   Indication:  Major Depressive Disorder     HYDROcodone-acetaminophen 10-325 MG per tablet  Commonly known as:  NORCO  Take 1 tablet by mouth 2 (two) times daily as needed.      levocetirizine 5 MG tablet  Commonly known as:  XYZAL  Take 5 mg by mouth daily.      lisinopril 10 MG tablet  Commonly known as:  PRINIVIL,ZESTRIL  Take 5 mg by mouth daily.      meloxicam 15 MG tablet  Commonly known as:  MOBIC  Take 15 mg by mouth daily as needed for pain.      VIAGRA 100 MG tablet  Generic drug:  sildenafil  Take 1 tablet (100 mg total) by mouth as needed for erectile dysfunction.            Follow-up Information   Schedule an appointment as soon as possible for a visit with Dr. Silvio Pate. (Please call Monday 08/01/13 to make an appointment to see Dr. Silvio Pate, as well as your therapist)    Contact information:   [facility] [address] [place]  [address] [phone]       Follow-up recommendations:  Activity:  Increase activity as tolerated Diet:  Low sodium diet, bp elevated during hospital stay.  Other:  Keep all  scheduled appointments with [facility]  Comments:  Please continue to take medications as directed. If your symptoms return, worsen, or persist please call your 911, report to local ER, or contact crisis hotline. Please do not drink alcohol or use any illegal substances while taking prescription medications.   Total Discharge Time:  Greater than 30 minutes.  Signed: Nanci Pina FNP-BC 07/31/2013, 1:43 PM  I personally assessed the patient and formulated the plan Geralyn Flash A. Sabra Heck, M.D.

## 2013-07-31 NOTE — Progress Notes (Signed)
[facility] Adult Case Management Discharge Plan :  Will you be returning to the same living situation after discharge: Yes,  home with wife At discharge, do you have transportation home?:Yes,  pick up by wife Do you have the ability to pay for your medications:Yes,  no issues  Release of information consent forms completed and in the chart;  Patient's signature needed at discharge.  Patient to Follow up at: Follow-up Information   Schedule an appointment as soon as possible for a visit with Dr. Silvio Pate. (Please call Monday 08/01/13 to make an appointment to see Dr. Silvio Pate, as well as your therapist)    Contact information:   [facility] [address] [place]  [address] [phone]      Patient denies SI/HI:   Yes,  has denied SI all along    Safety Planning and Suicide Prevention discussed:  Yes,  with both wife and patient  Lysle Dingwall 07/31/2013, 3:40 PM

## 2013-07-31 NOTE — Progress Notes (Signed)
D) Pt being discharged to home. Mood and affect are appropriate. Pt denies SI and HI and rates his depression and hopelessness both at a 0. Pt states that he has learned a lot and plans on using it to improve his out look on life. A) Pt given support and reassurance along with praise and affirmations. Encouragement given to Pt. All belongings returned to Pt and all medications and follow up plans explained to the Pt. Provided with a 1:1. Suicidal Prevention reviewed with the Pt. R) Pt denies SI and HI and plans to follow up with his out patient therapy and with his medication regimen.

## 2013-07-31 NOTE — Progress Notes (Signed)
Psychoeducational Group Note  Date:  07/31/2013 Time:  1015  Group Topic/Focus:  Making Healthy Choices:   The focus of this group is to help patients identify negative/unhealthy choices they were using prior to admission and identify positive/healthier coping strategies to replace them upon discharge.  Participation Level:  Active  Participation Quality:  Appropriate  Affect:  Appropriate  Cognitive:  Oriented  Insight:  Improving  Engagement in Group:  Engaged  Additional Comments:    Connor Meacham A 07/31/2013 

## 2013-08-01 NOTE — Clinical Social Work Note (Signed)
CSW spoke with patient to advise of appointment with [facility] Thursday, August 04, 2013 at 10AM with Pleas Patricia.

## 2013-08-03 NOTE — Progress Notes (Signed)
Patient Discharge Instructions:  After Visit Summary (AVS):   Faxed to:  08/03/13 Discharge Summary Note:   Faxed to:  08/03/13 Psychiatric Admission Assessment Note:   Faxed to:  08/03/13 Suicide Risk Assessment - Discharge Assessment:   Faxed to:  08/03/13 Faxed/Sent to the [facility] Care provider:  08/03/13 Faxed to [facility]  [place], 08/03/2013, 2:50 PM

## 2013-08-26 ENCOUNTER — Other Ambulatory Visit: Payer: Self-pay

## 2013-08-26 MED ORDER — HYDROCODONE-ACETAMINOPHEN 10-325 MG PO TABS: 1.0000 | ORAL_TABLET | Freq: Two times a day (BID) | ORAL | Status: DC | PRN

## 2013-09-22 ENCOUNTER — Other Ambulatory Visit: Payer: Self-pay

## 2013-09-22 MED ORDER — HYDROCODONE-ACETAMINOPHEN 10-325 MG PO TABS: 1.0000 | ORAL_TABLET | Freq: Two times a day (BID) | ORAL | Status: DC | PRN

## 2013-10-26 ENCOUNTER — Other Ambulatory Visit: Payer: Self-pay

## 2013-10-26 MED ORDER — HYDROCODONE-ACETAMINOPHEN 10-325 MG PO TABS: 1.0000 | ORAL_TABLET | Freq: Two times a day (BID) | ORAL | Status: DC | PRN

## 2013-11-14 ENCOUNTER — Telehealth: Payer: Self-pay

## 2013-11-14 NOTE — Telephone Encounter (Signed)
Chest pain with no relief should be seen in the ED or in urgent care  today, no available appts for today  He hjas no appt scheduled here , pls have him sched an appt for early Evan Jacobson has been needing to come in pls let him know

## 2013-11-14 NOTE — Telephone Encounter (Signed)
Patient aware and given appt for 11/2

## 2013-11-28 ENCOUNTER — Ambulatory Visit: Payer: 59 | Admitting: Family Medicine

## 2013-12-01 ENCOUNTER — Telehealth: Payer: Self-pay | Admitting: Family Medicine

## 2013-12-01 ENCOUNTER — Other Ambulatory Visit: Payer: Self-pay

## 2013-12-01 MED ORDER — HYDROCODONE-ACETAMINOPHEN 10-325 MG PO TABS: 1.0000 | ORAL_TABLET | Freq: Two times a day (BID) | ORAL | Status: DC | PRN

## 2013-12-01 NOTE — Telephone Encounter (Signed)
noted 

## 2013-12-27 ENCOUNTER — Other Ambulatory Visit: Payer: Self-pay

## 2013-12-27 MED ORDER — HYDROCODONE-ACETAMINOPHEN 10-325 MG PO TABS
1.0000 | ORAL_TABLET | Freq: Two times a day (BID) | ORAL | Status: DC | PRN
Start: 2013-12-27 — End: 2013-12-29

## 2013-12-29 ENCOUNTER — Other Ambulatory Visit: Payer: Self-pay

## 2013-12-29 MED ORDER — HYDROCODONE-ACETAMINOPHEN 10-325 MG PO TABS: 1.0000 | ORAL_TABLET | Freq: Two times a day (BID) | ORAL | Status: DC | PRN

## 2014-01-13 ENCOUNTER — Other Ambulatory Visit: Payer: Self-pay

## 2014-01-13 MED ORDER — HYDROCODONE-ACETAMINOPHEN 10-325 MG PO TABS
1.0000 | ORAL_TABLET | Freq: Two times a day (BID) | ORAL | Status: DC | PRN
Start: 2014-01-13 — End: 2014-02-10

## 2014-01-16 ENCOUNTER — Ambulatory Visit (INDEPENDENT_AMBULATORY_CARE_PROVIDER_SITE_OTHER): Payer: 59 | Admitting: Family Medicine

## 2014-01-16 ENCOUNTER — Encounter: Payer: Self-pay | Admitting: Family Medicine

## 2014-01-16 VITALS — BP 120/72 | HR 100 | Resp 18 | Ht 73.0 in | Wt 208.1 lb

## 2014-01-16 DIAGNOSIS — M5410 Radiculopathy, site unspecified: Secondary | ICD-10-CM

## 2014-01-16 DIAGNOSIS — I10 Essential (primary) hypertension: Secondary | ICD-10-CM

## 2014-01-16 DIAGNOSIS — F528 Other sexual dysfunction not due to a substance or known physiological condition: Secondary | ICD-10-CM

## 2014-01-16 DIAGNOSIS — R002 Palpitations: Secondary | ICD-10-CM

## 2014-01-16 DIAGNOSIS — J309 Allergic rhinitis, unspecified: Secondary | ICD-10-CM

## 2014-01-16 DIAGNOSIS — F3130 Bipolar disorder, current episode depressed, mild or moderate severity, unspecified: Secondary | ICD-10-CM

## 2014-01-16 NOTE — Patient Instructions (Addendum)
F/u in 4 month, call if you need me before  You are referred for MRI of your low back ,and to see Dr Rolena Infante, spine surgeon, since symptoms are worse  EKG today due to c/o heart racing in November, and you are referred to cardiology in [place], the EKG today is entirely normal which is good  Please take your pain medication 12 hours apart on a regular schedule for more benefit  Pls bring all medication to your next visit , this is for your safety!  All the best for 2016!

## 2014-01-16 NOTE — Progress Notes (Signed)
   Subjective:    Patient ID: Evan Jacobson, male    DOB: 03/15/1965, 48 y.o.   MRN: [id number]  HPI The PT is here for follow up and re-evaluation of chronic medical conditions, medication management and review of any available recent lab and radiology data.  Preventive health is updated, specifically  Cancer screening and Immunization.  Refuses flu vac Discussed recent hospitalization , he sees psych in [place]. The PT denies any adverse reactions to current medications since the last visit.  In November, he had 4 days of heart racing every 45 secs for about 6 secs Worsening back pain, weak left leg and reduced thigh sensation       Review of Systems See HPI Denies recent fever or chills. Denies sinus pressure, nasal congestion, ear pain or sore throat. Denies chest congestion, productive cough or wheezing. Denies chest pains, PND, orthopnea  and leg swelling Denies abdominal pain, nausea, vomiting,diarrhea or constipation.   Denies dysuria, frequency, hesitancy or incontinence.  Denies uncontrolled  Denies skin break down or rash.         Objective:   Physical Exam BP 120/72 mmHg  Pulse 100  Resp 18  Ht 6\' 1"  (1.854 m)  Wt 208 lb 1.9 oz (94.403 kg)  BMI 27.46 kg/m2  SpO2 99% Patient alert and oriented and in no cardiopulmonary distress.  HEENT: No facial asymmetry, EOMI,   oropharynx pink and moist.  Neck supple no JVD, no mass.  Chest: Clear to auscultation bilaterally.  CVS: S1, S2 no murmurs, no S3.Regular rate.  ABD: Soft non tender.   Ext: No edema  MS: decreased  ROM lumbar  Spine, adequate in  shoulders, hips and knees.  Skin: Intact, no ulcerations or rash noted.  Psych: Good eye contact, normal affect. Memory intact not anxious or depressed appearing.  CNS: CN 2-12 intact,decreased power and sensation in left thigh, otherwise normal throughout        Assessment & Plan:  Back pain with left-sided radiculopathy Increased and  uncontrolled back and left lower extremity pain, needs imaginng study nd neurosurg eval  Palpitations Recent episoode of symptomatic palpitations, ekg in office today is normal, pt is referred for cardiology eval due to symptoms severityy  Essential hypertension Controlled, no change in medication DASH diet and commitment to daily physical activity for a minimum of 30 minutes discussed and encouraged, as a part of hypertension management. The importance of attaining a healthy weight is also discussed.   Bipolar disorder Improved following recent hospitalization , followed regularly by psychiatry and therapist  Allergic rhinitis Controlled, no change in medication   ERECTILE DYSFUNCTION Responds to viagra, continue as needed use

## 2014-01-17 ENCOUNTER — Telehealth: Payer: Self-pay | Admitting: Family Medicine

## 2014-01-18 NOTE — Telephone Encounter (Signed)
Noted  

## 2014-01-18 NOTE — Telephone Encounter (Signed)
error 

## 2014-01-24 NOTE — Assessment & Plan Note (Signed)
Improved following recent hospitalization , followed regularly by psychiatry and therapist

## 2014-01-24 NOTE — Assessment & Plan Note (Signed)
Controlled, no change in medication DASH diet and commitment to daily physical activity for a minimum of 30 minutes discussed and encouraged, as a part of hypertension management. The importance of attaining a healthy weight is also discussed.  

## 2014-01-24 NOTE — Assessment & Plan Note (Signed)
Recent episoode of symptomatic palpitations, ekg in office today is normal, pt is referred for cardiology eval due to symptoms severityy

## 2014-01-24 NOTE — Assessment & Plan Note (Signed)
Increased and uncontrolled back and left lower extremity pain, needs imaginng study nd neurosurg eval

## 2014-01-24 NOTE — Assessment & Plan Note (Signed)
Controlled, no change in medication  

## 2014-01-24 NOTE — Assessment & Plan Note (Signed)
Responds to viagra, continue as needed use

## 2014-01-25 ENCOUNTER — Ambulatory Visit (HOSPITAL_COMMUNITY): Payer: 59

## 2014-01-30 ENCOUNTER — Encounter: Payer: Self-pay | Admitting: Family Medicine

## 2014-02-10 ENCOUNTER — Other Ambulatory Visit: Payer: Self-pay

## 2014-02-10 MED ORDER — HYDROCODONE-ACETAMINOPHEN 10-325 MG PO TABS: 1.0000 | ORAL_TABLET | Freq: Two times a day (BID) | ORAL | Status: DC | PRN

## 2014-02-20 ENCOUNTER — Telehealth: Payer: Self-pay | Admitting: Family Medicine

## 2014-02-20 NOTE — Telephone Encounter (Signed)
Pt called in requesting that a letter be written stating that he is unemployabe because of his back , he further stated tha the [facility] at [facility] occasions in 2015 he was so incapicitated that he was bed confined for 3 to 5 days  Letter will be prepared  And pt to be  be notified

## 2014-02-22 NOTE — Telephone Encounter (Signed)
Left msg l;etting pt know that I had provided the best info I had available Also suggested if [facility] had anything they felt that would be helpful to send directly that was OK

## 2014-02-23 ENCOUNTER — Telehealth: Payer: Self-pay

## 2014-02-23 NOTE — Telephone Encounter (Addendum)
Pls let Evan Jacobson know that I do respect the policy of the [place], so I understand that I will not receive correspondence from them  Pls let Evan Jacobson know that I am not able to state that he is unable to work because of his back/incapable of working because of his back   Pls let Evan Jacobson know that he needs to have evaluation by either an orthopedic Doc, as i have advised in the past, or a neurosurgeon, depending on what the upcoming MRI report states , or both , to give their Professional opinions as to whether the condition of his back/spine make him incapable of working.  I am not a specialist in the field , and with the information that I have I am INCAPABLE of determing this , he needs the professional opinion of experts in the field

## 2014-02-24 NOTE — Telephone Encounter (Signed)
Patient aware and states that he will await the MRI.

## 2014-02-27 ENCOUNTER — Telehealth: Payer: Self-pay

## 2014-02-27 DIAGNOSIS — M5410 Radiculopathy, site unspecified: Secondary | ICD-10-CM

## 2014-02-27 NOTE — Telephone Encounter (Signed)
Referral entered  

## 2014-02-27 NOTE — Telephone Encounter (Signed)
(  Patient wanted me to give you this message)  Wanted to let you know that he cancelled his MRI because he said he has had 4 in the past 10 years and they are $600 out of pocket for him and it is only going to show what you already know/ that his back is the same or worse than previous MRI. He said you have told him many times that he cannot work due to his back and he doesn't understand why you can't write it on paper so he isn't going to waste his money getting an MRI.

## 2014-02-28 ENCOUNTER — Ambulatory Visit (HOSPITAL_COMMUNITY): Payer: 59 | Attending: Family Medicine

## 2014-02-28 NOTE — Telephone Encounter (Signed)
noted 

## 2014-03-10 ENCOUNTER — Ambulatory Visit: Payer: Self-pay | Admitting: Cardiovascular Disease

## 2014-04-14 ENCOUNTER — Other Ambulatory Visit: Payer: Self-pay

## 2014-04-14 MED ORDER — HYDROCODONE-ACETAMINOPHEN 10-325 MG PO TABS: 1.0000 | ORAL_TABLET | Freq: Two times a day (BID) | ORAL | Status: DC | PRN

## 2014-05-11 ENCOUNTER — Other Ambulatory Visit: Payer: Self-pay

## 2014-05-11 MED ORDER — HYDROCODONE-ACETAMINOPHEN 10-325 MG PO TABS: 1.0000 | ORAL_TABLET | Freq: Two times a day (BID) | ORAL | Status: DC | PRN

## 2014-05-18 ENCOUNTER — Ambulatory Visit: Payer: PRIVATE HEALTH INSURANCE | Admitting: Family Medicine

## 2014-05-18 ENCOUNTER — Encounter: Payer: Self-pay | Admitting: Family Medicine

## 2014-05-19 ENCOUNTER — Ambulatory Visit (INDEPENDENT_AMBULATORY_CARE_PROVIDER_SITE_OTHER): Payer: 59 | Admitting: Family Medicine

## 2014-05-19 ENCOUNTER — Encounter: Payer: Self-pay | Admitting: Family Medicine

## 2014-05-19 VITALS — BP 120/88 | HR 61 | Resp 16 | Ht 73.0 in | Wt 212.0 lb

## 2014-05-19 DIAGNOSIS — J302 Other seasonal allergic rhinitis: Secondary | ICD-10-CM | POA: Diagnosis not present

## 2014-05-19 DIAGNOSIS — F528 Other sexual dysfunction not due to a substance or known physiological condition: Secondary | ICD-10-CM

## 2014-05-19 DIAGNOSIS — M5410 Radiculopathy, site unspecified: Secondary | ICD-10-CM

## 2014-05-19 DIAGNOSIS — I10 Essential (primary) hypertension: Secondary | ICD-10-CM

## 2014-05-19 DIAGNOSIS — E663 Overweight: Secondary | ICD-10-CM

## 2014-05-19 DIAGNOSIS — J309 Allergic rhinitis, unspecified: Secondary | ICD-10-CM

## 2014-05-19 DIAGNOSIS — F4310 Post-traumatic stress disorder, unspecified: Secondary | ICD-10-CM

## 2014-05-19 MED ORDER — KETOROLAC TROMETHAMINE 60 MG/2ML IM SOLN
60.0000 mg | Freq: Once | INTRAMUSCULAR | Status: AC
  Administered 2014-05-19: 60 mg via INTRAMUSCULAR

## 2014-05-19 MED ORDER — MOMETASONE FUROATE 50 MCG/ACT NA SUSP: 2.0000 | Freq: Every day | NASAL | Status: DC

## 2014-05-19 MED ORDER — METHYLPREDNISOLONE ACETATE 80 MG/ML IJ SUSP
80.0000 mg | Freq: Once | INTRAMUSCULAR | Status: AC
  Administered 2014-05-19: 80 mg via INTRAMUSCULAR

## 2014-05-19 NOTE — Assessment & Plan Note (Signed)
Sub optimal control with weight gain DASH diet and commitment to daily physical activity for a minimum of 30 minutes discussed and encouraged, as a part of hypertension management. The importance of attaining a healthy weight is also discussed.  BP/Weight 05/19/2014 01/16/2014 07/29/2013 07/28/2013 06/23/2013 6/41/5830 10/01/766  Systolic BP 088 110 315 - 945 859 292  Diastolic BP 88 72 91 - 68 90 78  Wt. (Lbs) 212 208.12 - 195 198.04 201.12 -  BMI 27.98 27.46 - 27.21 27.63 28.06 -  Some encounter information is confidential and restricted. Go to Review Flowsheets activity to see all data.

## 2014-05-19 NOTE — Patient Instructions (Addendum)
F/u in 4.5 month, please call if you need me before  Injections in office today for back pain  Nasonex sent to help with allergies   Please work on food choice and portion control as dicussed, and add an addition 3 days of walking to your current regime   All the best  Thanks for choosing [facility], we consider it a privelige to serve you.

## 2014-05-19 NOTE — Assessment & Plan Note (Signed)
Sub optimal control, add nasonex, continue claritin daily

## 2014-05-19 NOTE — Assessment & Plan Note (Signed)
Uncontrolled.Toradol and depo medrol administered IM in the office , 

## 2014-05-19 NOTE — Progress Notes (Signed)
Subjective:    Patient ID: Evan Jacobson, male    DOB: 09/12/1965, 49 y.o.   MRN: [id number]  HPI    Evan Jacobson     MRN: [id number]      DOB: 1965/09/16   HPI Evan Jacobson is here for follow up and re-evaluation of chronic medical conditions, medication management and review of any available recent lab and radiology data.  Preventive health is updated, specifically  Cancer screening and Immunization.   Questions or concerns regarding consultations or procedures which the PT has had in the interim are  addressed. The PT denies any adverse reactions to current medications since the last visit.  Increased low back pain radiating to left leg x 1 day, requests injections for this, states due to weather change  ROS Denies recent fever or chills. Denies sinus pressure, nasal congestion, ear pain or sore throat. Denies chest congestion, productive cough or wheezing. Denies chest pains, palpitations and leg swelling Denies abdominal pain, nausea, vomiting,diarrhea or constipation.   Denies dysuria, frequency, hesitancy or incontinence. Denies joint pain, swelling and limitation in mobility. Denies headaches, seizures, numbness, or tingling. Denies depression, anxiety or insomnia. Denies skin break down or rash.   PE  BP 120/88 mmHg  Pulse 61  Resp 16  Ht 6\' 1"  (1.854 m)  Wt 212 lb (96.163 kg)  BMI 27.98 kg/m2  SpO2 100%  Patient alert and oriented and in no cardiopulmonary distress.  HEENT: No facial asymmetry, EOMI,   oropharynx pink and moist.  Neck supple no JVD, no mass.  Chest: Clear to auscultation bilaterally.  CVS: S1, S2 no murmurs, no S3.Regular rate.  ABD: Soft non tender.   Ext: No edema  TI:[id number]  ROM lumbosacral spine, adequate in shoulders, hips and knees.  Skin: Intact, no ulcerations or rash noted.  Psych: Good eye contact, normal affect. Memory intact not anxious or depressed appearing.  CNS: CN 2-12 intact, power,  normal throughout.no  focal deficits noted.   Assessment & Plan   Essential hypertension Sub optimal control with weight gain DASH diet and commitment to daily physical activity for a minimum of 30 minutes discussed and encouraged, as a part of hypertension management. The importance of attaining a healthy weight is also discussed.  BP/Weight 05/19/2014 01/16/2014 07/29/2013 07/28/2013 06/23/2013 09/20/537 08/02/7339  Systolic BP 937 902 409 - 735 329 924  Diastolic BP 88 72 91 - 68 90 78  Wt. (Lbs) 212 208.12 - 195 198.04 201.12 -  BMI 27.98 27.46 - 27.21 27.63 28.06 -  Some encounter information is confidential and restricted. Go to Review Flowsheets activity to see all data.         Allergic rhinitis Sub optimal control, add nasonex, continue claritin daily   Back pain with left-sided radiculopathy Uncontrolled.Toradol and depo medrol administered IM in the office , .    ERECTILE DYSFUNCTION Adequate response to viagra , as needed   PTSD (post-traumatic stress disorder) Reports control on fluoxetine, treated through the [place] by mental health services   Overweight (BMI 25.0-29.9) Deteriorated. Patient re-educated about  the importance of commitment to a  minimum of 150 minutes of exercise per week.  The importance of healthy food choices with portion control discussed. Encouraged to start a food diary, count calories and to consider  joining a support group. Sample diet sheets offered. Goals set by the patient for the next several months.   Weight /BMI 05/19/2014 01/16/2014 07/28/2013  WEIGHT 212 lb 208 lb 1.9 oz  195 lb  HEIGHT 6\' 1"  6\' 1"  5\' 11"   BMI 27.98 kg/m2 27.46 kg/m2 27.21 kg/m2  Some encounter information is confidential and restricted. Go to Review Flowsheets activity to see all data.    Current exercise per week 240 minutes.        Review of Systems     Objective:   Physical Exam        Assessment & Plan:

## 2014-05-20 DIAGNOSIS — E66811 Obesity, class 1: Secondary | ICD-10-CM | POA: Insufficient documentation

## 2014-05-20 DIAGNOSIS — E669 Obesity, unspecified: Secondary | ICD-10-CM | POA: Insufficient documentation

## 2014-05-20 NOTE — Assessment & Plan Note (Signed)
Deteriorated. Patient re-educated about  the importance of commitment to a  minimum of 150 minutes of exercise per week.  The importance of healthy food choices with portion control discussed. Encouraged to start a food diary, count calories and to consider  joining a support group. Sample diet sheets offered. Goals set by the patient for the next several months.   Weight /BMI 05/19/2014 01/16/2014 07/28/2013  WEIGHT 212 lb 208 lb 1.9 oz 195 lb  HEIGHT 6\' 1"  6\' 1"  5\' 11"   BMI 27.98 kg/m2 27.46 kg/m2 27.21 kg/m2  Some encounter information is confidential and restricted. Go to Review Flowsheets activity to see all data.    Current exercise per week 240 minutes.

## 2014-05-20 NOTE — Assessment & Plan Note (Signed)
Reports control on fluoxetine, treated through the [place] by mental health services

## 2014-05-20 NOTE — Assessment & Plan Note (Signed)
Adequate response to viagra , as needed

## 2014-06-08 ENCOUNTER — Other Ambulatory Visit: Payer: Self-pay

## 2014-06-08 MED ORDER — HYDROCODONE-ACETAMINOPHEN 10-325 MG PO TABS: 1.0000 | ORAL_TABLET | Freq: Two times a day (BID) | ORAL | Status: DC | PRN

## 2014-06-19 ENCOUNTER — Telehealth: Payer: Self-pay

## 2014-06-19 NOTE — Telephone Encounter (Signed)
Pin is dues to extra arm exercise, maybe he is pushing himself too much, I recommend for that topical muscle rubs and tylenol 500 mg one to two per day, would not increase hydrocodone for that pls explain

## 2014-06-20 NOTE — Telephone Encounter (Signed)
Patient aware and will cut back some on exercise, will try addition of muscle rub and additional tylenol.

## 2014-06-28 ENCOUNTER — Encounter: Payer: Self-pay | Admitting: Family Medicine

## 2014-06-28 ENCOUNTER — Ambulatory Visit (INDEPENDENT_AMBULATORY_CARE_PROVIDER_SITE_OTHER): Payer: 59 | Admitting: Family Medicine

## 2014-06-28 ENCOUNTER — Telehealth: Payer: Self-pay

## 2014-06-28 VITALS — BP 130/78 | HR 86 | Resp 18 | Ht 72.0 in | Wt 206.0 lb

## 2014-06-28 DIAGNOSIS — R1031 Right lower quadrant pain: Principal | ICD-10-CM

## 2014-06-28 DIAGNOSIS — G8929 Other chronic pain: Secondary | ICD-10-CM

## 2014-06-28 DIAGNOSIS — E663 Overweight: Secondary | ICD-10-CM | POA: Diagnosis not present

## 2014-06-28 DIAGNOSIS — I10 Essential (primary) hypertension: Secondary | ICD-10-CM | POA: Diagnosis not present

## 2014-06-28 DIAGNOSIS — R109 Unspecified abdominal pain: Secondary | ICD-10-CM

## 2014-06-28 HISTORY — DX: Right lower quadrant pain: R10.31

## 2014-06-28 HISTORY — DX: Other chronic pain: G89.29

## 2014-06-28 MED ORDER — IBUPROFEN 800 MG PO TABS: 800.0000 mg | ORAL_TABLET | Freq: Three times a day (TID) | ORAL | Status: DC | PRN

## 2014-06-28 MED ORDER — PREDNISONE 5 MG (21) PO TBPK: 5.0000 mg | ORAL_TABLET | ORAL | Status: DC

## 2014-06-28 NOTE — Assessment & Plan Note (Signed)
ImprovedPatient re-educated about  the importance of commitment to a  minimum of 150 minutes of exercise per week.  The importance of healthy food choices with portion control discussed. Encouraged to start a food diary, count calories and to consider  joining a support group. Sample diet sheets offered. Goals set by the patient for the next several months.   Weight /BMI 06/28/2014 05/19/2014 01/16/2014  WEIGHT 206 lb 212 lb 208 lb 1.9 oz  HEIGHT - 6\' 1"  6\' 1"   BMI 27.18 kg/m2 27.98 kg/m2 27.46 kg/m2  Some encounter information is confidential and restricted. Go to Review Flowsheets activity to see all data.    Current exercise per week 200 minutes.

## 2014-06-28 NOTE — Progress Notes (Signed)
   Subjective:    Patient ID: Evan Jacobson, male    DOB: 07-01-1965, 49 y.o.   MRN: [id number]  HPI 2 month h/o right groin pain worse with direct pressure and turning and lying on the affected side, does not radiate, no change in BM, fever , chills or nausea. He  was concerned that he may have acute appendicitis and called in,states he was up all night worrying. Has increased activty and cut back on intake with weight loss success    Review of Systems See HPI Denies recent fever or chills. Denies sinus pressure, nasal congestion, ear pain or sore throat. Denies chest congestion, productive cough or wheezing. Denies chest pains, palpitations and leg swelling  Denies dysuria, frequency, hesitancy or incontinence. Chronic back pain unchanged Denies uncontrolled  depression, anxiety or insomnia. Denies skin break down or rash.        Objective:   Physical Exam BP 130/78 mmHg  Pulse 86  Resp 18  Ht 6' (1.829 m)  Wt 206 lb (93.441 kg)  BMI 27.93 kg/m2  SpO2 97%   Patient alert and oriented and in no cardiopulmonary distress.  HEENT: No facial asymmetry, EOMI,   oropharynx pink and moist.  Neck supple no JVD, no mass.  Chest: Clear to auscultation bilaterally.  CVS: S1, S2 no murmurs, no S3.Regular rate.  ABD: Soft no localized guarding , tenderness or rebound, normal bowel sounds  Ext: No edema  MS: Adequate ROM spine, shoulders, hips and knees.Tender in right mid inguinal regional, no palpable mass  Skin: Intact, no ulcerations or rash noted.  Psych: Good eye contact, normal affect. Memory intact not anxious or depressed appearing.  CNS: CN 2-12 intact, power,  normal throughout.no focal deficits noted.       Assessment & Plan:  Groin pain, chronic, right 2 month history, 1 week course of anti inflammatories, pain worsened by direct pressure and movement, likely due tio inguinal ligametn strain   Essential hypertension Controlled, no change in  medication DASH diet and commitment to daily physical activity for a minimum of 30 minutes discussed and encouraged, as a part of hypertension management. The importance of attaining a healthy weight is also discussed.  BP/Weight 06/28/2014 05/19/2014 01/16/2014 07/29/2013 07/28/2013 06/23/2013 05/05/8117  Systolic BP 147 829 562 130 - 865 784  Diastolic BP 78 88 72 91 - 68 90  Wt. (Lbs) 206 212 208.12 - 195 198.04 201.12  BMI 27.18 27.98 27.46 - 27.21 27.63 28.06  Some encounter information is confidential and restricted. Go to Review Flowsheets activity to see all data.         Overweight (BMI 25.0-29.9) ImprovedPatient re-educated about  the importance of commitment to a  minimum of 150 minutes of exercise per week.  The importance of healthy food choices with portion control discussed. Encouraged to start a food diary, count calories and to consider  joining a support group. Sample diet sheets offered. Goals set by the patient for the next several months.   Weight /BMI 06/28/2014 05/19/2014 01/16/2014  WEIGHT 206 lb 212 lb 208 lb 1.9 oz  HEIGHT - 6\' 1"  6\' 1"   BMI 27.18 kg/m2 27.98 kg/m2 27.46 kg/m2  Some encounter information is confidential and restricted. Go to Review Flowsheets activity to see all data.    Current exercise per week 200 minutes.

## 2014-06-28 NOTE — Telephone Encounter (Signed)
pls work in this am as one pt is NS

## 2014-06-28 NOTE — Assessment & Plan Note (Signed)
Controlled, no change in medication DASH diet and commitment to daily physical activity for a minimum of 30 minutes discussed and encouraged, as a part of hypertension management. The importance of attaining a healthy weight is also discussed.  BP/Weight 06/28/2014 05/19/2014 01/16/2014 07/29/2013 07/28/2013 06/23/2013 1/73/5670  Systolic BP 141 030 131 438 - 887 579  Diastolic BP 78 88 72 91 - 68 90  Wt. (Lbs) 206 212 208.12 - 195 198.04 201.12  BMI 27.18 27.98 27.46 - 27.21 27.63 28.06  Some encounter information is confidential and restricted. Go to Review Flowsheets activity to see all data.

## 2014-06-28 NOTE — Assessment & Plan Note (Addendum)
2 month history, 1 week course of anti inflammatories, pain worsened by direct pressure and movement, likely due tio inguinal ligametn strain

## 2014-06-28 NOTE — Patient Instructions (Addendum)
F/u as before  You do not have acute appendicitis.  You have pain in the right inguinal ligament (groin) worse with movement and direct pressure, anti inflammatories prescribed   To help pain in upper arms with exercise take an additional  Tylenol 500 mg one daily

## 2014-06-28 NOTE — Telephone Encounter (Signed)
If cancel

## 2014-06-28 NOTE — Telephone Encounter (Signed)
Patient called in today stating he has been having lower right abdominal pain x 1-2 months. Achiness and tenderness. Rates pain at a 4. Is afraid it may be his appendix. Doesn't think its bad enough to go to ER but wants to know if he can be seen or some imaging be ordered. Please advise

## 2014-07-10 ENCOUNTER — Telehealth: Payer: Self-pay

## 2014-07-10 NOTE — Telephone Encounter (Signed)
Saline nasal flushes  And daily use of allergy meds,. No antibiotics at this time

## 2014-07-10 NOTE — Telephone Encounter (Signed)
Sinus congestion and sinus headache with facial pain x 2 days. Mucus is grayish and yellow in color. May have had a slight temp sat night when it started. No chills. Please advise

## 2014-07-11 NOTE — Telephone Encounter (Signed)
Patient aware.

## 2014-07-14 ENCOUNTER — Other Ambulatory Visit: Payer: Self-pay

## 2014-07-14 MED ORDER — HYDROCODONE-ACETAMINOPHEN 10-325 MG PO TABS: 1.0000 | ORAL_TABLET | Freq: Two times a day (BID) | ORAL | Status: DC | PRN

## 2014-08-11 ENCOUNTER — Other Ambulatory Visit: Payer: Self-pay

## 2014-08-11 MED ORDER — HYDROCODONE-ACETAMINOPHEN 10-325 MG PO TABS: 1.0000 | ORAL_TABLET | Freq: Two times a day (BID) | ORAL | Status: DC | PRN

## 2014-09-08 ENCOUNTER — Other Ambulatory Visit: Payer: Self-pay

## 2014-09-08 MED ORDER — HYDROCODONE-ACETAMINOPHEN 10-325 MG PO TABS: 1.0000 | ORAL_TABLET | Freq: Two times a day (BID) | ORAL | Status: DC | PRN

## 2014-09-13 ENCOUNTER — Ambulatory Visit (INDEPENDENT_AMBULATORY_CARE_PROVIDER_SITE_OTHER): Payer: 59

## 2014-09-13 ENCOUNTER — Telehealth: Payer: Self-pay

## 2014-09-13 ENCOUNTER — Other Ambulatory Visit: Payer: Self-pay | Admitting: Family Medicine

## 2014-09-13 DIAGNOSIS — M5410 Radiculopathy, site unspecified: Secondary | ICD-10-CM | POA: Diagnosis not present

## 2014-09-13 MED ORDER — PREDNISONE 5 MG (21) PO TBPK: 5.0000 mg | ORAL_TABLET | ORAL | Status: DC

## 2014-09-13 MED ORDER — KETOROLAC TROMETHAMINE 60 MG/2ML IM SOLN
60.0000 mg | Freq: Once | INTRAMUSCULAR | Status: AC
  Administered 2014-09-13: 60 mg via INTRAMUSCULAR

## 2014-09-13 MED ORDER — METHYLPREDNISOLONE ACETATE 80 MG/ML IJ SUSP
80.0000 mg | Freq: Once | INTRAMUSCULAR | Status: AC
  Administered 2014-09-13: 80 mg via INTRAMUSCULAR

## 2014-09-13 NOTE — Telephone Encounter (Signed)
Patient would like to know if he can have prednisone?

## 2014-09-13 NOTE — Progress Notes (Signed)
Patient in for nurse visit for injections for back pain.  Injections given as ordered.  Patient also prescribed Prednisone.  Will call back with any further problems.

## 2014-09-13 NOTE — Telephone Encounter (Signed)
Yes to prednisone i have sent this

## 2014-09-13 NOTE — Telephone Encounter (Signed)
Error

## 2014-09-13 NOTE — Telephone Encounter (Signed)
Verbal order given for T60 and D80.   Patient will be seen as nurse visit.

## 2014-09-13 NOTE — Telephone Encounter (Signed)
Noted and patient aware to check with pharmacy.

## 2014-09-21 ENCOUNTER — Telehealth: Payer: Self-pay

## 2014-09-21 DIAGNOSIS — R29898 Other symptoms and signs involving the musculoskeletal system: Secondary | ICD-10-CM

## 2014-09-21 NOTE — Telephone Encounter (Signed)
pls refer to ortho of his choivce I will sign

## 2014-09-21 NOTE — Addendum Note (Signed)
Addended by: Eual Fines on: 09/21/2014 10:13 AM   Modules accepted: Orders

## 2014-09-21 NOTE — Telephone Encounter (Signed)
States he has been having increased lower back pain and he has been dragging his right foot when he walks and wants a referral to ortho in [place]. Please advise

## 2014-09-21 NOTE — Telephone Encounter (Signed)
Patient referred.

## 2014-10-06 ENCOUNTER — Other Ambulatory Visit: Payer: Self-pay

## 2014-10-06 MED ORDER — HYDROCODONE-ACETAMINOPHEN 10-325 MG PO TABS: 1.0000 | ORAL_TABLET | Freq: Two times a day (BID) | ORAL | Status: DC | PRN

## 2014-10-11 ENCOUNTER — Ambulatory Visit: Payer: Self-pay | Admitting: Family Medicine

## 2014-10-11 ENCOUNTER — Encounter: Payer: Self-pay | Admitting: *Deleted

## 2014-10-16 ENCOUNTER — Ambulatory Visit: Payer: Self-pay | Admitting: Orthopedic Surgery

## 2014-10-25 ENCOUNTER — Encounter: Payer: Self-pay | Admitting: Family Medicine

## 2014-10-25 ENCOUNTER — Ambulatory Visit (INDEPENDENT_AMBULATORY_CARE_PROVIDER_SITE_OTHER): Payer: 59 | Admitting: Family Medicine

## 2014-10-25 VITALS — BP 128/86 | HR 98 | Resp 16 | Ht 72.0 in | Wt 211.4 lb

## 2014-10-25 DIAGNOSIS — Z1159 Encounter for screening for other viral diseases: Secondary | ICD-10-CM

## 2014-10-25 DIAGNOSIS — Z1211 Encounter for screening for malignant neoplasm of colon: Secondary | ICD-10-CM | POA: Diagnosis not present

## 2014-10-25 DIAGNOSIS — Z0000 Encounter for general adult medical examination without abnormal findings: Secondary | ICD-10-CM

## 2014-10-25 DIAGNOSIS — Z0271 Encounter for disability determination: Secondary | ICD-10-CM | POA: Insufficient documentation

## 2014-10-25 DIAGNOSIS — Z125 Encounter for screening for malignant neoplasm of prostate: Secondary | ICD-10-CM

## 2014-10-25 LAB — COMPREHENSIVE METABOLIC PANEL
ALT: 25 U/L (ref 9–46)
AST: 22 U/L (ref 10–40)
Albumin: 4.3 g/dL (ref 3.6–5.1)
Alkaline Phosphatase: 62 U/L (ref 40–115)
BUN: 14 mg/dL (ref 7–25)
CO2: 29 mmol/L (ref 20–31)
Calcium: 10 mg/dL (ref 8.6–10.3)
Chloride: 102 mmol/L (ref 98–110)
Creat: 1.07 mg/dL (ref 0.60–1.35)
Glucose, Bld: 81 mg/dL (ref 65–99)
Potassium: 4.7 mmol/L (ref 3.5–5.3)
Sodium: 140 mmol/L (ref 135–146)
Total Bilirubin: 0.5 mg/dL (ref 0.2–1.2)
Total Protein: 7.8 g/dL (ref 6.1–8.1)

## 2014-10-25 LAB — LIPID PANEL
Cholesterol: 220 mg/dL — ABNORMAL HIGH (ref 125–200)
HDL: 57 mg/dL (ref >=40)
LDL Cholesterol: 141 mg/dL — ABNORMAL HIGH (ref <130)
Total CHOL/HDL Ratio: 3.9 Ratio (ref <=5.0)
Triglycerides: 111 mg/dL (ref <150)
VLDL: 22 mg/dL (ref <30)

## 2014-10-25 LAB — CBC WITH DIFFERENTIAL/PLATELET
Basophils Absolute: 0 10*3/uL (ref 0.0–0.1)
Basophils Relative: 1 % (ref 0–1)
Eosinophils Absolute: 0.2 10*3/uL (ref 0.0–0.7)
Eosinophils Relative: 5 % (ref 0–5)
HCT: 47 % (ref 39.0–52.0)
Hemoglobin: 16.1 g/dL (ref 13.0–17.0)
Lymphocytes Relative: 29 % (ref 12–46)
Lymphs Abs: 1.4 10*3/uL (ref 0.7–4.0)
MCH: 30.3 pg (ref 26.0–34.0)
MCHC: 34.3 g/dL (ref 30.0–36.0)
MCV: 88.5 fL (ref 78.0–100.0)
MPV: 10.9 fL (ref 8.6–12.4)
Monocytes Absolute: 0.3 10*3/uL (ref 0.1–1.0)
Monocytes Relative: 7 % (ref 3–12)
Neutro Abs: 2.8 10*3/uL (ref 1.7–7.7)
Neutrophils Relative %: 58 % (ref 43–77)
Platelets: 206 10*3/uL (ref 150–400)
RBC: 5.31 MIL/uL (ref 4.22–5.81)
RDW: 13.8 % (ref 11.5–15.5)
WBC: 4.8 10*3/uL (ref 4.0–10.5)

## 2014-10-25 LAB — POC HEMOCCULT BLD/STL (OFFICE/1-CARD/DIAGNOSTIC): Fecal Occult Blood, POC: NEGATIVE

## 2014-10-25 NOTE — Patient Instructions (Signed)
F/u in 4.5 month, call if you need me ebfore'  Labs today  Please work on good  health habits so that your health will improve. 1. Commitment to daily physical activity for 30 to 60  minutes, if you are able to do this.  2. Commitment to wise food choices. Aim for half of your  food intake to be vegetable and fruit, one quarter starchy foods, and one quarter protein. Try to eat on a regular schedule  3 meals per day, snacking between meals should be limited to vegetables or fruits or small portions of nuts. 64 ounces of water per day is generally recommended, unless you have specific health conditions, like heart failure or kidney failure where you will need to limit fluid intake.  3. Commitment to sufficient and a  good quality of physical and mental rest daily, generally between 6 to 8 hours per day.  WITH PERSISTANCE AND PERSEVERANCE, THE IMPOSSIBLE , BECOMES THE NORM!  Blood pressure slightly elevated  Thanks for choosing [facility], we consider it a privelige to serve you.

## 2014-10-25 NOTE — Progress Notes (Signed)
   Subjective:    Patient ID: Evan Jacobson, male    DOB: 1965-06-05, 49 y.o.   MRN: [id number]  HPI Patient is in for annual physical exam. No other health concerns are expressed or addressed at the visit. Needs influenza  Vaccine, but refuses Review of Systems See HPI     Objective:   Physical Exam BP 128/86 mmHg  Pulse 98  Resp 16  Ht 6' (1.829 m)  Wt 211 lb 6.4 oz (95.89 kg)  BMI 28.66 kg/m2  SpO2 99%   Pleasant well nourished male, alert and oriented x 3, in no cardio-pulmonary distress. Afebrile. HEENT No facial trauma or asymetry. Sinuses non tender. EOMI.Marland Kitchen External ears normal, tympanic membranes clear. Oropharynx moist, no exudate, good dentition. Neck: supple, no adenopathy,JVD or thyromegaly.No bruits.  Chest: Clear to ascultation bilaterally.No crackles or wheezes. Non tender to palpation  Breast: No asymetry,no masses. No nipple discharge or inversion. No axillary or supraclavicular adenopathy  Cardiovascular system; Heart sounds normal,  S1 and  S2 ,no S3.  No murmur, or thrill. Apical beat not displaced Peripheral pulses normal.  Abdomen: Soft, non tender, no organomegaly or masses. No bruits. Bowel sounds normal. No guarding, tenderness or rebound.  Rectal:  Normal sphincter tone. No hemorrhoids or  masses. guaiac negative stool. Prostate smooth and firm  GU: Not examined  Musculoskeletal exam: Full ROM of spine, hips , shoulders and knees. No deformity ,swelling or crepitus noted. No muscle wasting or atrophy.   Neurologic: Cranial nerves 2 to 12 intact. Power, tone ,sensation and reflexes normal throughout. No disturbance in gait. No tremor.  Skin: Intact, no ulceration, erythema , scaling or rash noted. Pigmentation normal throughout  Psych; Normal mood and affect. Judgement and concentration normal         Assessment & Plan:  Annual physical exam Annual exam as documented. Counseling done  re healthy lifestyle  involving commitment to 150 minutes exercise per week, heart healthy diet, and attaining healthy weight.The importance of adequate sleep also discussed. Regular seat belt use and home safety, is also discussed. Changes in health habits are decided on by the patient with goals and time frames  set for achieving them. Immunization and cancer screening needs are specifically addressed at this visit.

## 2014-10-25 NOTE — Assessment & Plan Note (Signed)

## 2014-10-26 LAB — HIV ANTIBODY (ROUTINE TESTING W REFLEX): HIV 1&2 Ab, 4th Generation: NONREACTIVE

## 2014-10-26 LAB — PSA: PSA: 1.1 ng/mL (ref <=4.00)

## 2014-10-26 LAB — TSH: TSH: 1.052 u[IU]/mL (ref 0.350–4.500)

## 2014-10-27 ENCOUNTER — Other Ambulatory Visit: Payer: Self-pay

## 2014-10-27 MED ORDER — HYDROCODONE-ACETAMINOPHEN 10-325 MG PO TABS: 1.0000 | ORAL_TABLET | Freq: Two times a day (BID) | ORAL | Status: DC | PRN

## 2014-12-01 ENCOUNTER — Other Ambulatory Visit: Payer: Self-pay

## 2014-12-01 MED ORDER — HYDROCODONE-ACETAMINOPHEN 10-325 MG PO TABS
1.0000 | ORAL_TABLET | Freq: Two times a day (BID) | ORAL | Status: DC | PRN
Start: 2014-12-01 — End: 2015-01-04

## 2014-12-12 ENCOUNTER — Ambulatory Visit (INDEPENDENT_AMBULATORY_CARE_PROVIDER_SITE_OTHER): Payer: PRIVATE HEALTH INSURANCE

## 2014-12-12 ENCOUNTER — Telehealth: Payer: Self-pay | Admitting: Family Medicine

## 2014-12-12 ENCOUNTER — Telehealth: Payer: Self-pay

## 2014-12-12 VITALS — BP 136/72

## 2014-12-12 DIAGNOSIS — M549 Dorsalgia, unspecified: Principal | ICD-10-CM

## 2014-12-12 DIAGNOSIS — G8929 Other chronic pain: Secondary | ICD-10-CM

## 2014-12-12 DIAGNOSIS — M5410 Radiculopathy, site unspecified: Secondary | ICD-10-CM | POA: Diagnosis not present

## 2014-12-12 MED ORDER — METHYLPREDNISOLONE ACETATE 80 MG/ML IJ SUSP
80.0000 mg | Freq: Once | INTRAMUSCULAR | Status: AC
Start: 2014-12-12 — End: 2014-12-12
  Administered 2014-12-12: 80 mg via INTRAMUSCULAR

## 2014-12-12 MED ORDER — KETOROLAC TROMETHAMINE 60 MG/2ML IM SOLN
60.0000 mg | Freq: Once | INTRAMUSCULAR | Status: AC
  Administered 2014-12-12: 60 mg via INTRAMUSCULAR

## 2014-12-12 NOTE — Progress Notes (Signed)
Patient in for nurse visit for injections due to back pain.  Injections given as ordered.  No voiced complaints.   Will call with further problems.

## 2014-12-12 NOTE — Telephone Encounter (Signed)
Patient aware and will come in for injections this afternoon.  

## 2014-12-12 NOTE — Telephone Encounter (Signed)
Wants to be referred to surgeon at [facility] about his back. Thinks he may need surgery. [place] [facility] told him that he didn't have a problem that required surgery so he wants another opinion from [place]. Please advise

## 2014-12-12 NOTE — Telephone Encounter (Signed)
Patient is asking if he can come in a get an injection in his back, please advise?

## 2014-12-12 NOTE — Telephone Encounter (Signed)
OK to refer with MRI of 2013, Evan Jacobson will order updated one there if they wish or request an updsated one be done here prior to appt, pls enter, I will sign, dx is chronic back pain

## 2014-12-12 NOTE — Telephone Encounter (Signed)
Please advise.  Last injections in August.

## 2014-12-12 NOTE — Telephone Encounter (Signed)
May get toradol 60 mg and depo medrol 80 mg IM

## 2014-12-13 NOTE — Telephone Encounter (Signed)
Referral entered  

## 2014-12-13 NOTE — Addendum Note (Signed)
Addended by: Eual Fines on: 12/13/2014 09:21 AM   Modules accepted: Orders

## 2015-01-02 ENCOUNTER — Telehealth: Payer: Self-pay | Admitting: Family Medicine

## 2015-01-02 NOTE — Telephone Encounter (Signed)
Patient aware.

## 2015-01-02 NOTE — Telephone Encounter (Signed)
Brandi please call Mr. Hartsock he has a medical question

## 2015-01-04 ENCOUNTER — Other Ambulatory Visit: Payer: Self-pay

## 2015-01-04 MED ORDER — HYDROCODONE-ACETAMINOPHEN 10-325 MG PO TABS: 1.0000 | ORAL_TABLET | Freq: Two times a day (BID) | ORAL | Status: DC | PRN

## 2015-02-02 ENCOUNTER — Other Ambulatory Visit: Payer: Self-pay

## 2015-02-02 MED ORDER — HYDROCODONE-ACETAMINOPHEN 10-325 MG PO TABS: 1.0000 | ORAL_TABLET | Freq: Two times a day (BID) | ORAL | Status: DC | PRN

## 2015-02-26 ENCOUNTER — Ambulatory Visit (INDEPENDENT_AMBULATORY_CARE_PROVIDER_SITE_OTHER): Payer: 59 | Admitting: Family Medicine

## 2015-02-26 ENCOUNTER — Encounter: Payer: Self-pay | Admitting: Family Medicine

## 2015-02-26 VITALS — BP 130/94 | HR 78 | Resp 18 | Ht 72.0 in | Wt 221.0 lb

## 2015-02-26 DIAGNOSIS — I10 Essential (primary) hypertension: Secondary | ICD-10-CM | POA: Diagnosis not present

## 2015-02-26 DIAGNOSIS — E663 Overweight: Secondary | ICD-10-CM | POA: Diagnosis not present

## 2015-02-26 DIAGNOSIS — M5410 Radiculopathy, site unspecified: Secondary | ICD-10-CM | POA: Diagnosis not present

## 2015-02-26 DIAGNOSIS — J3089 Other allergic rhinitis: Secondary | ICD-10-CM

## 2015-02-26 DIAGNOSIS — F3131 Bipolar disorder, current episode depressed, mild: Secondary | ICD-10-CM

## 2015-02-26 NOTE — Progress Notes (Signed)
Subjective:    Patient ID: Evan Jacobson, male    DOB: 19-Jun-1965, 50 y.o.   MRN: [id number]  HPI   Evan Jacobson     MRN: [id number]      DOB: May 13, 1965   HPI Evan Jacobson is here for follow up and re-evaluation of chronic medical conditions, medication management and review of any available recent lab and radiology data.  Preventive health is updated, specifically  Cancer screening and Immunization.   Questions or concerns regarding consultations or procedures which the PT has had in the interim are  addressed. The PT denies any adverse reactions to current medications since the last visit.  There are no new concerns.  There are no specific complaints   ROS Denies recent fever or chills. Denies sinus pressure, nasal congestion, ear pain or sore throat. Denies chest congestion, productive cough or wheezing. Denies chest pains, palpitations and leg swelling Denies abdominal pain, nausea, vomiting,diarrhea or constipation.   Denies dysuria, frequency, hesitancy or incontinence. Denies uncontrolled  joint pain, swelling and limitation in mobility. Denies headaches, seizures, numbness, or tingling. Denies depression, anxiety or insomnia. Denies skin break down or rash.   PE  BP 130/94 mmHg  Pulse 78  Resp 18  Ht 6' (1.829 m)  Wt 221 lb (100.245 kg)  BMI 29.97 kg/m2  SpO2 98%  Patient alert and oriented and in no cardiopulmonary distress.  HEENT: No facial asymmetry, EOMI,   oropharynx pink and moist.  Neck supple no JVD, no mass.  Chest: Clear to auscultation bilaterally.  CVS: S1, S2 no murmurs, no S3.Regular rate.  ABD: Soft non tender.   Ext: No edema  MS: Adequate though reduced  ROM spine, normal in  shoulders, hips and knees.  Skin: Intact, no ulcerations or rash noted.  Psych: Good eye contact, normal affect. Memory intact not anxious or depressed appearing.  CNS: CN 2-12 intact, power,  normal throughout.no focal deficits noted.   Assessment &  Plan   Essential hypertension Uncontrolled, with excessive weight gain. No change in medication, will focus on lifestyle change and weight loss DASH diet and commitment to daily physical activity for a minimum of 30 minutes discussed and encouraged, as a part of hypertension management. The importance of attaining a healthy weight is also discussed.  BP/Weight 02/26/2015 12/12/2014 10/25/2014 06/28/2014 05/19/2014 [id number]  Systolic BP [id number]  Diastolic BP 94 72 86 78 88 72 91  Wt. (Lbs) 221 - 211.4 206 212 208.12 -  BMI 29.97 - 28.66 27.93 27.98 27.46 -  Some encounter information is confidential and restricted. Go to Review Flowsheets activity to see all data.        Back pain with left-sided radiculopathy Unchanged, continue current medication Weight loss encouraged and back and core strengthening exercises  Allergic rhinitis Controlled on daily med with no current or recent flare  Overweight (BMI 25.0-29.9) Deteriorated. Patient re-educated about  the importance of commitment to a  minimum of 150 minutes of exercise per week.  The importance of healthy food choices with portion control discussed. Encouraged to start a food diary, count calories and to consider  joining a support group. Sample diet sheets offered. Goals set by the patient for the next several months.   Weight /BMI 02/26/2015 10/25/2014 06/28/2014  WEIGHT 221 lb 211 lb 6.4 oz 206 lb  HEIGHT 6\' 0"  6\' 0"  6\' 0"   BMI 29.97 kg/m2 28.66 kg/m2 27.93 kg/m2  Some encounter information is  confidential and restricted. Go to Review Flowsheets activity to see all data.    Current exercise per week 60 minutes.   Bipolar disorder Pt has independently discontinued medication , states he is doing well and does not need medcation at this time. Of note , he is not exercising and is overeating He denies suicidal or homicidal thought, he has stopped theraopy which he gets through the [place], I have  encouraged him to resume at least therapy and commit to healthier l;ifestyle       Review of Systems     Objective:   Physical Exam        Assessment & Plan:

## 2015-02-26 NOTE — Patient Instructions (Addendum)
F/u in 4.5 month, call if you need me before  Please work on good  health habits so that your health will improve. 1. Commitment to daily physical activity for 30 to 60  minutes, if you are able to do this.  2. Commitment to wise food choices. Aim for half of your  food intake to be vegetable and fruit, one quarter starchy foods, and one quarter protein. Try to eat on a regular schedule  3 meals per day, snacking between meals should be limited to vegetables or fruits or small portions of nuts. 64 ounces of water per day is generally recommended, unless you have specific health conditions, like heart failure or kidney failure where you will need to limit fluid intake.  3. Commitment to sufficient and a  good quality of physical and mental rest daily, generally between 6 to 8 hours per day.  WITH PERSISTANCE AND PERSEVERANCE, THE IMPOSSIBLE , BECOMES THE NORM!   Fasting lipid and chem 7 in 4.5 month  DASH Eating Plan DASH stands for "Dietary Approaches to Stop Hypertension." The DASH eating plan is a healthy eating plan that has been shown to reduce high blood pressure (hypertension). Additional health benefits may include reducing the risk of type 2 diabetes mellitus, heart disease, and stroke. The DASH eating plan may also help with weight loss. WHAT DO I NEED TO KNOW ABOUT THE DASH EATING PLAN? For the DASH eating plan, you will follow these general guidelines:  Choose foods with a percent daily value for sodium of less than 5% (as listed on the food label).  Use salt-free seasonings or herbs instead of table salt or sea salt.  Check with your health care provider or pharmacist before using salt substitutes.  Eat lower-sodium products, often labeled as "lower sodium" or "no salt added."  Eat fresh foods.  Eat more vegetables, fruits, and low-fat dairy products.  Choose whole grains. Look for the word "whole" as the first word in the ingredient list.  Choose fish and skinless  chicken or [place] more often than red meat. Limit fish, poultry, and meat to 6 oz (170 g) each day.  Limit sweets, desserts, sugars, and sugary drinks.  Choose heart-healthy fats.  Limit cheese to 1 oz (28 g) per day.  Eat more home-cooked food and less restaurant, buffet, and fast food.  Limit fried foods.  Cook foods using methods other than frying.  Limit canned vegetables. If you do use them, rinse them well to decrease the sodium.  When eating at a restaurant, ask that your food be prepared with less salt, or no salt if possible. WHAT FOODS CAN I EAT? Seek help from a dietitian for individual calorie needs. Grains Whole grain or whole wheat bread. Brown rice. Whole grain or whole wheat pasta. Quinoa, bulgur, and whole grain cereals. Low-sodium cereals. Corn or whole wheat flour tortillas. Whole grain cornbread. Whole grain crackers. Low-sodium crackers. Vegetables Fresh or frozen vegetables (raw, steamed, roasted, or grilled). Low-sodium or reduced-sodium tomato and vegetable juices. Low-sodium or reduced-sodium tomato sauce and paste. Low-sodium or reduced-sodium canned vegetables.  Fruits All fresh, canned (in natural juice), or frozen fruits. Meat and Other Protein Products Ground beef (85% or leaner), grass-fed beef, or beef trimmed of fat. Skinless chicken or [place]. Ground chicken or [place]. Pork trimmed of fat. All fish and seafood. Eggs. Dried beans, peas, or lentils. Unsalted nuts and seeds. Unsalted canned beans. Dairy Low-fat dairy products, such as skim or 1% milk, 2% or reduced-fat cheeses,  low-fat ricotta or cottage cheese, or plain low-fat yogurt. Low-sodium or reduced-sodium cheeses. Fats and Oils Tub margarines without trans fats. Light or reduced-fat mayonnaise and salad dressings (reduced sodium). Avocado. Safflower, olive, or canola oils. Natural peanut or almond butter. Other Unsalted popcorn and pretzels. The items listed above may not be a complete list of  recommended foods or beverages. Contact your dietitian for more options. WHAT FOODS ARE NOT RECOMMENDED? Grains White bread. White pasta. White rice. Refined cornbread. Bagels and croissants. Crackers that contain trans fat. Vegetables Creamed or fried vegetables. Vegetables in a cheese sauce. Regular canned vegetables. Regular canned tomato sauce and paste. Regular tomato and vegetable juices. Fruits Dried fruits. Canned fruit in light or heavy syrup. Fruit juice. Meat and Other Protein Products Fatty cuts of meat. Ribs, chicken wings, bacon, sausage, bologna, salami, chitterlings, fatback, hot dogs, bratwurst, and packaged luncheon meats. Salted nuts and seeds. Canned beans with salt. Dairy Whole or 2% milk, cream, half-and-half, and cream cheese. Whole-fat or sweetened yogurt. Full-fat cheeses or blue cheese. Nondairy creamers and whipped toppings. Processed cheese, cheese spreads, or cheese curds. Condiments Onion and garlic salt, seasoned salt, table salt, and sea salt. Canned and packaged gravies. Worcestershire sauce. Tartar sauce. Barbecue sauce. Teriyaki sauce. Soy sauce, including reduced sodium. Steak sauce. Fish sauce. Oyster sauce. Cocktail sauce. Horseradish. Ketchup and mustard. Meat flavorings and tenderizers. Bouillon cubes. Hot sauce. Tabasco sauce. Marinades. Taco seasonings. Relishes. Fats and Oils Butter, stick margarine, lard, shortening, ghee, and bacon fat. Coconut, palm kernel, or palm oils. Regular salad dressings. Other Pickles and olives. Salted popcorn and pretzels. The items listed above may not be a complete list of foods and beverages to avoid. Contact your dietitian for more information. WHERE CAN I FIND MORE INFORMATION? National Heart, Lung, and Blood Institute: travelstabloid.com   This information is not intended to replace advice given to you by your health care provider. Make sure you discuss any questions you have with  your health care provider.   Document Released: 01/02/2011 Document Revised: 02/03/2014 Document Reviewed: 11/17/2012 Elsevier Interactive Patient Education [employer].

## 2015-03-01 ENCOUNTER — Other Ambulatory Visit: Payer: Self-pay

## 2015-03-01 MED ORDER — HYDROCODONE-ACETAMINOPHEN 10-325 MG PO TABS: 1.0000 | ORAL_TABLET | Freq: Two times a day (BID) | ORAL | Status: DC | PRN

## 2015-03-03 NOTE — Assessment & Plan Note (Signed)
Uncontrolled, with excessive weight gain. No change in medication, will focus on lifestyle change and weight loss DASH diet and commitment to daily physical activity for a minimum of 30 minutes discussed and encouraged, as a part of hypertension management. The importance of attaining a healthy weight is also discussed.  BP/Weight 02/26/2015 12/12/2014 10/25/2014 06/28/2014 05/19/2014 [id number]  Systolic BP [id number]  Diastolic BP 94 72 86 78 88 72 91  Wt. (Lbs) 221 - 211.4 206 212 208.12 -  BMI 29.97 - 28.66 27.93 27.98 27.46 -  Some encounter information is confidential and restricted. Go to Review Flowsheets activity to see all data.

## 2015-03-03 NOTE — Assessment & Plan Note (Signed)
Pt has independently discontinued medication , states he is doing well and does not need medcation at this time. Of note , he is not exercising and is overeating He denies suicidal or homicidal thought, he has stopped theraopy which he gets through the [place], I have encouraged him to resume at least therapy and commit to healthier l;ifestyle

## 2015-03-03 NOTE — Assessment & Plan Note (Signed)
Controlled on daily med with no current or recent flare

## 2015-03-03 NOTE — Assessment & Plan Note (Signed)
Unchanged, continue current medication Weight loss encouraged and back and core strengthening exercises

## 2015-03-03 NOTE — Assessment & Plan Note (Signed)
Deteriorated. Patient re-educated about  the importance of commitment to a  minimum of 150 minutes of exercise per week.  The importance of healthy food choices with portion control discussed. Encouraged to start a food diary, count calories and to consider  joining a support group. Sample diet sheets offered. Goals set by the patient for the next several months.   Weight /BMI 02/26/2015 10/25/2014 06/28/2014  WEIGHT 221 lb 211 lb 6.4 oz 206 lb  HEIGHT 6\' 0"  6\' 0"  6\' 0"   BMI 29.97 kg/m2 28.66 kg/m2 27.93 kg/m2  Some encounter information is confidential and restricted. Go to Review Flowsheets activity to see all data.    Current exercise per week 60 minutes.

## 2015-03-12 ENCOUNTER — Other Ambulatory Visit: Payer: Self-pay

## 2015-03-12 ENCOUNTER — Emergency Department (HOSPITAL_COMMUNITY)
Admission: EM | Admit: 2015-03-12 | Discharge: 2015-03-12 | Disposition: A | Discharge status: Home / Self Care | Payer: 59 | Attending: Emergency Medicine | Admitting: Emergency Medicine

## 2015-03-12 ENCOUNTER — Emergency Department (HOSPITAL_COMMUNITY): Payer: 59

## 2015-03-12 ENCOUNTER — Encounter (HOSPITAL_COMMUNITY): Payer: Self-pay | Admitting: *Deleted

## 2015-03-12 DIAGNOSIS — I10 Essential (primary) hypertension: Secondary | ICD-10-CM | POA: Diagnosis not present

## 2015-03-12 DIAGNOSIS — Z79899 Other long term (current) drug therapy: Secondary | ICD-10-CM | POA: Diagnosis not present

## 2015-03-12 DIAGNOSIS — Z8719 Personal history of other diseases of the digestive system: Secondary | ICD-10-CM | POA: Diagnosis not present

## 2015-03-12 DIAGNOSIS — Z8659 Personal history of other mental and behavioral disorders: Secondary | ICD-10-CM | POA: Insufficient documentation

## 2015-03-12 DIAGNOSIS — G8929 Other chronic pain: Secondary | ICD-10-CM | POA: Diagnosis not present

## 2015-03-12 DIAGNOSIS — Z87828 Personal history of other (healed) physical injury and trauma: Secondary | ICD-10-CM | POA: Diagnosis not present

## 2015-03-12 DIAGNOSIS — R079 Chest pain, unspecified: Secondary | ICD-10-CM | POA: Diagnosis present

## 2015-03-12 DIAGNOSIS — R0789 Other chest pain: Secondary | ICD-10-CM | POA: Insufficient documentation

## 2015-03-12 LAB — HEPATIC FUNCTION PANEL
ALT: 40 U/L (ref 17–63)
AST: 40 U/L (ref 15–41)
Albumin: 4.2 g/dL (ref 3.5–5.0)
Alkaline Phosphatase: 54 U/L (ref 38–126)
Bilirubin, Direct: 0.1 mg/dL (ref 0.1–0.5)
Indirect Bilirubin: 0.3 mg/dL (ref 0.3–0.9)
Total Bilirubin: 0.4 mg/dL (ref 0.3–1.2)
Total Protein: 7.8 g/dL (ref 6.5–8.1)

## 2015-03-12 LAB — CBC
HCT: 45.4 % (ref 39.0–52.0)
Hemoglobin: 15.3 g/dL (ref 13.0–17.0)
MCH: 30.1 pg (ref 26.0–34.0)
MCHC: 33.7 g/dL (ref 30.0–36.0)
MCV: 89.2 fL (ref 78.0–100.0)
Platelets: 217 10*3/uL (ref 150–400)
RBC: 5.09 MIL/uL (ref 4.22–5.81)
RDW: 12.3 % (ref 11.5–15.5)
WBC: 5.3 10*3/uL (ref 4.0–10.5)

## 2015-03-12 LAB — BASIC METABOLIC PANEL
Anion gap: 7 (ref 5–15)
BUN: 18 mg/dL (ref 6–20)
CO2: 27 mmol/L (ref 22–32)
Calcium: 9.3 mg/dL (ref 8.9–10.3)
Chloride: 105 mmol/L (ref 101–111)
Creatinine, Ser: 1.29 mg/dL — ABNORMAL HIGH (ref 0.61–1.24)
GFR calc Af Amer: >60 mL/min (ref >60)
GFR calc non Af Amer: >60 mL/min (ref >60)
Glucose, Bld: 90 mg/dL (ref 65–99)
Potassium: 4.1 mmol/L (ref 3.5–5.1)
Sodium: 139 mmol/L (ref 135–145)

## 2015-03-12 LAB — D-DIMER, QUANTITATIVE: D-Dimer, Quant: 0.35 ug/mL-FEU (ref 0.00–0.50)

## 2015-03-12 LAB — LIPASE, BLOOD: Lipase: 29 U/L (ref 11–51)

## 2015-03-12 LAB — TROPONIN I: Troponin I: <0.03 ng/mL (ref <0.031)

## 2015-03-12 MED ORDER — IBUPROFEN 800 MG PO TABS: 800.0000 mg | ORAL_TABLET | Freq: Three times a day (TID) | ORAL | Status: DC | PRN

## 2015-03-12 NOTE — ED Provider Notes (Signed)
CSN: [id number]     Arrival date & time 03/12/15  1334 History   First MD Initiated Contact with Patient 03/12/15 1515     Chief Complaint  Patient presents with  . Chest Pain     (Consider location/radiation/quality/duration/timing/severity/associated sxs/prior Treatment) Patient is a 50 y.o. male presenting with chest pain. The history is provided by the patient (The patient states that he has left-sided chest pain is worse when he lifts his left arm up no shortness of breath no sweating).  Chest Pain Pain location:  L chest Pain quality: aching   Pain radiates to:  Does not radiate Pain radiates to the back: no   Pain severity:  Moderate Onset quality:  Sudden Timing:  Intermittent Progression:  Waxing and waning Chronicity:  New Context: not breathing   Associated symptoms: no abdominal pain, no back pain, no cough, no fatigue and no headache     Past Medical History  Diagnosis Date  . Allergic rhinitis   . GERD (gastroesophageal reflux disease)   . Hypertension   . Concussion 2012  . PTSD (post-traumatic stress disorder)   . Chronic back pain    History reviewed. No pertinent past surgical history. Family History  Problem Relation Age of Onset  . Stroke Father   . Hypertension Mother     [name]  . Hypertension Father   . Hypertension Sister   . Hypertension Sister   . Hypertension Brother   . Hypertension Brother   . Heart disease Mother     Angina   Social History  Substance Use Topics  . Smoking status: Never Smoker   . Smokeless tobacco: None  . Alcohol Use: Yes     Comment: occ; six pack per week    Review of Systems  Constitutional: Negative for appetite change and fatigue.  HENT: Negative for congestion, ear discharge and sinus pressure.   Eyes: Negative for discharge.  Respiratory: Negative for cough.   Cardiovascular: Positive for chest pain.  Gastrointestinal: Negative for abdominal pain and diarrhea.  Genitourinary: Negative for frequency and  hematuria.  Musculoskeletal: Negative for back pain.  Skin: Negative for rash.  Neurological: Negative for seizures and headaches.  Psychiatric/Behavioral: Negative for hallucinations.      Allergies  Sulfonamide derivatives  Home Medications   Prior to Admission medications   Medication Sig Start Date End Date Taking? Authorizing Provider  fluticasone (FLONASE) 50 MCG/ACT nasal spray Place 2 sprays into both nostrils daily as needed for allergies or rhinitis.   Yes Historical Provider, MD  HYDROcodone-acetaminophen (NORCO) 10-325 MG tablet Take 1 tablet by mouth 2 (two) times daily as needed. Patient taking differently: Take 1 tablet by mouth 2 (two) times daily as needed for moderate pain.  03/01/15  Yes Fayrene Helper, MD  KRILL OIL PO Take 1 capsule by mouth daily.   Yes Historical Provider, MD  lisinopril (PRINIVIL,ZESTRIL) 40 MG tablet Take 40 mg by mouth daily.  05/02/14  Yes Historical Provider, MD  loratadine (CLARITIN) 10 MG tablet Take 1 tablet (10 mg total) by mouth daily. 05/19/14  Yes Fayrene Helper, MD  Multiple Vitamin (MULTIVITAMIN WITH MINERALS) TABS tablet Take 1 tablet by mouth daily.   Yes Historical Provider, MD  ibuprofen (ADVIL,MOTRIN) 800 MG tablet Take 1 tablet (800 mg total) by mouth every 8 (eight) hours as needed for moderate pain. 03/12/15   Milton Ferguson, MD   BP 138/88 mmHg  Pulse 74  Temp(Src) 98.3 F (36.8 C) (Oral)  Resp 22  Ht 5\' 11"  (1.803 m)  Wt 215 lb (97.523 kg)  BMI 30.00 kg/m2  SpO2 99% Physical Exam  Constitutional: He is oriented to person, place, and time. He appears well-developed.  HENT:  Head: Normocephalic.  Eyes: Conjunctivae and EOM are normal. No scleral icterus.  Neck: Neck supple. No thyromegaly present.  Cardiovascular: Normal rate and regular rhythm.  Exam reveals no gallop and no friction rub.   No murmur heard. Pulmonary/Chest: No stridor. He has no wheezes. He has no rales. He exhibits no tenderness.  Abdominal:  He exhibits no distension. There is no tenderness. There is no rebound.  Musculoskeletal: Normal range of motion. He exhibits no edema.  Lymphadenopathy:    He has no cervical adenopathy.  Neurological: He is oriented to person, place, and time. He exhibits normal muscle tone. Coordination normal.  Skin: No rash noted. No erythema.  Psychiatric: He has a normal mood and affect. His behavior is normal.    ED Course  Procedures (including critical care time) Labs Review Labs Reviewed  BASIC METABOLIC PANEL - Abnormal; Notable for the following:    Creatinine, Ser 1.29 (*)    All other components within normal limits  CBC  TROPONIN I  D-DIMER, QUANTITATIVE (NOT AT [facility])  HEPATIC FUNCTION PANEL  LIPASE, BLOOD    Imaging Review Dg Chest 2 View  03/12/2015  CLINICAL DATA:  Left side chest pain, left arm pain starting this a.m. EXAM: CHEST  2 VIEW COMPARISON:  09/30/2010 FINDINGS: Cardiomediastinal silhouette is stable. No acute infiltrate or pleural effusion. No pulmonary edema. Minimal degenerative changes lower thoracic spine. IMPRESSION: No active cardiopulmonary disease. Electronically Signed   By: Lahoma Crocker M.D.   On: 03/12/2015 14:51   I have personally reviewed and evaluated these images and lab results as part of my medical decision-making.   EKG Interpretation   Date/Time:  Monday March 12 2015 14:16:54 EST Ventricular Rate:  74 PR Interval:  122 QRS Duration: 90 QT Interval:  396 QTC Calculation: 439 R Axis:   39 Text Interpretation:  Normal sinus rhythm with sinus arrhythmia Possible  Left atrial enlargement Borderline ECG since last tracing no significant  change Confirmed by WENTZ  MD, ELLIOTT [id number]) on 03/12/2015 2:54:50 PM      MDM   Final diagnoses:  Chest wall pain    Chest wall pain with normal labs including cardiac enzymes and d-dimer. EKG unremarkable chest x-ray unremarkable patient given prescription of Motrin and will follow-up with his  PCP    Milton Ferguson, MD 03/12/15 [phone]

## 2015-03-12 NOTE — Discharge Instructions (Signed)
Follow up with your family md if not improving. °

## 2015-03-12 NOTE — ED Notes (Signed)
Pt states he has had pain that moves across his left chest and in his left armpit. Pt denies any dizziness or SOB. No n/v/d.

## 2015-04-05 ENCOUNTER — Other Ambulatory Visit: Payer: Self-pay

## 2015-04-05 MED ORDER — HYDROCODONE-ACETAMINOPHEN 10-325 MG PO TABS: 1.0000 | ORAL_TABLET | Freq: Two times a day (BID) | ORAL | Status: DC | PRN

## 2015-04-09 ENCOUNTER — Other Ambulatory Visit: Payer: Self-pay

## 2015-04-09 ENCOUNTER — Encounter: Payer: Self-pay | Admitting: Family Medicine

## 2015-04-09 ENCOUNTER — Ambulatory Visit (INDEPENDENT_AMBULATORY_CARE_PROVIDER_SITE_OTHER): Payer: 59 | Admitting: Family Medicine

## 2015-04-09 VITALS — BP 128/82 | HR 82 | Temp 98.4°F | Resp 18 | Ht 72.0 in | Wt 218.0 lb

## 2015-04-09 DIAGNOSIS — J029 Acute pharyngitis, unspecified: Secondary | ICD-10-CM | POA: Diagnosis not present

## 2015-04-09 DIAGNOSIS — I10 Essential (primary) hypertension: Secondary | ICD-10-CM | POA: Diagnosis not present

## 2015-04-09 DIAGNOSIS — K219 Gastro-esophageal reflux disease without esophagitis: Secondary | ICD-10-CM

## 2015-04-09 DIAGNOSIS — J3089 Other allergic rhinitis: Secondary | ICD-10-CM | POA: Diagnosis not present

## 2015-04-09 LAB — POCT RAPID STREP A (OFFICE): Rapid Strep A Screen: NEGATIVE

## 2015-04-09 MED ORDER — PENICILLIN V POTASSIUM 500 MG PO TABS: 500.0000 mg | ORAL_TABLET | Freq: Three times a day (TID) | ORAL | Status: DC

## 2015-04-09 MED ORDER — AMLODIPINE BESYLATE 5 MG PO TABS: 5.0000 mg | ORAL_TABLET | Freq: Every day | ORAL | Status: DC

## 2015-04-09 MED ORDER — FIRST-DUKES MOUTHWASH MT SUSP: OROMUCOSAL | Status: DC

## 2015-04-09 MED ORDER — OMEPRAZOLE 40 MG PO CPDR: 40.0000 mg | DELAYED_RELEASE_CAPSULE | Freq: Every day | ORAL | Status: DC

## 2015-04-09 MED ORDER — METHYLPREDNISOLONE ACETATE 80 MG/ML IJ SUSP
80.0000 mg | Freq: Once | INTRAMUSCULAR | Status: AC
  Administered 2015-04-09: 80 mg via INTRAMUSCULAR

## 2015-04-09 MED ORDER — CEFTRIAXONE SODIUM 500 MG IJ SOLR
500.0000 mg | Freq: Once | INTRAMUSCULAR | Status: AC
  Administered 2015-04-09: 500 mg via INTRAMUSCULAR

## 2015-04-09 NOTE — Assessment & Plan Note (Signed)
Importance of daily use of medications is sressed

## 2015-04-09 NOTE — Assessment & Plan Note (Addendum)
Severe painful swallowing, start high dose PPI, uncontrolled

## 2015-04-09 NOTE — Progress Notes (Signed)
   Subjective:    Patient ID: Evan Jacobson, male    DOB: 09/11/1965, 50 y.o.   MRN: [id number]  HPI  5 day h/o acute sore throat. Painful even swallowing saliva, pain also referred to ears.Unable to eat. No fever or chills, feels as though thick heavy material is attemptiing to go down, throat often feels dry, and as though it is closing up from time to time , even before this acute illness. Only takes zantac for reflux, will also add PPI   Review of Systems See HPI . Denies sinus pressure, has noted increased nasal congestion. Denies chest congestion, productive cough or wheezing. Denies chest pains, palpitations and leg swelling Denies abdominal pain, nausea, vomiting,diarrhea or constipation.   Denies dysuria, frequency, hesitancy or incontinence. Denies joint pain, swelling and limitation in mobility. Denies headaches, seizures, numbness, or tingling. Denies depression, anxiety or insomnia. Denies skin break down or rash.        Objective:   Physical Exam  BP 128/82 mmHg  Pulse 82  Temp(Src) 98.4 F (36.9 C)  Resp 18  Ht 6' (1.829 m)  Wt 218 lb (98.884 kg)  BMI 29.56 kg/m2  SpO2 98% Patient alert and oriented and in no cardiopulmonary distress.Bending over, and crying out intermittently in pain from spasms in throat and when swallowing  HEENT: No facial asymmetry, EOMI,   oropharynx erythematous  and moist.No exudate  Neck supple no JVD, bilateral anterior cervical adenitis, no sinus tenderness, erythema and edema of nasal mucosa.  Chest: Clear to auscultation bilaterally.  CVS: S1, S2 no murmurs, no S3.Regular rate.  ABD: Soft non tender.   Ext: No edema  MS: Adequate ROM spine, shoulders, hips and knees.  Skin: Intact, no ulcerations or rash noted.  Psych: Good eye contact, normal affect. Memory intact not anxious or depressed appearing.  CNS: CN 2-12 intact, power,  normal throughout.no focal deficits noted.      Assessment & Plan:

## 2015-04-09 NOTE — Patient Instructions (Signed)
F/u in 6 weeks, call if you need me sooner  You are treated today for acute pharyngitis, rocephin and depo medrol administered in the office  Penicillin, 10 day course, and Dukes mouthwash are prescribed also  Please continue ranitidine as before, new addition for reflux is daily omeprazole , pls take this as uncontrolled reflux may be causing your pain  Stop lisinopril for BP management , as you report potentially bad allergic reaction, new is amlodipine one daily

## 2015-04-09 NOTE — Assessment & Plan Note (Addendum)
Rocephin 500 mg Im and depo medrol 80 mg im in office, followed by pen V, dukes magic mouthwash. Rapid strep test negative

## 2015-04-09 NOTE — Assessment & Plan Note (Addendum)
Possible ace allergy as though throat  is closing on intermittent basis, start amlodipine and d/c lisinopril DASH diet and commitment to daily physical activity for a minimum of 30 minutes discussed and encouraged, as a part of hypertension management. The importance of attaining a healthy weight is also discussed.  BP/Weight 04/09/2015 03/12/2015 02/26/2015 12/12/2014 10/25/2014 06/28/2014 0000000  Systolic BP 0000000 0000000 [id number] 0000000 [id number] [id number]  Diastolic BP 82 88 94 72 86 78 88  Wt. (Lbs) 218 215 221 - 211.4 206 212  BMI 29.56 30 29.97 - 28.66 27.93 27.98

## 2015-05-04 ENCOUNTER — Other Ambulatory Visit: Payer: Self-pay

## 2015-05-04 DIAGNOSIS — I10 Essential (primary) hypertension: Secondary | ICD-10-CM

## 2015-05-04 MED ORDER — AMLODIPINE BESYLATE 5 MG PO TABS: 5.0000 mg | ORAL_TABLET | Freq: Every day | ORAL | Status: DC

## 2015-05-04 MED ORDER — HYDROCODONE-ACETAMINOPHEN 10-325 MG PO TABS: 1.0000 | ORAL_TABLET | Freq: Two times a day (BID) | ORAL | Status: DC | PRN

## 2015-06-01 ENCOUNTER — Other Ambulatory Visit: Payer: Self-pay

## 2015-06-01 MED ORDER — HYDROCODONE-ACETAMINOPHEN 10-325 MG PO TABS: 1.0000 | ORAL_TABLET | Freq: Two times a day (BID) | ORAL | Status: DC | PRN

## 2015-06-26 ENCOUNTER — Other Ambulatory Visit: Payer: Self-pay

## 2015-06-26 MED ORDER — HYDROCODONE-ACETAMINOPHEN 10-325 MG PO TABS: 1.0000 | ORAL_TABLET | Freq: Two times a day (BID) | ORAL | Status: DC | PRN

## 2015-07-10 ENCOUNTER — Other Ambulatory Visit: Payer: Self-pay

## 2015-07-10 ENCOUNTER — Encounter: Payer: Self-pay | Admitting: Family Medicine

## 2015-07-10 ENCOUNTER — Ambulatory Visit (INDEPENDENT_AMBULATORY_CARE_PROVIDER_SITE_OTHER): Payer: 59 | Admitting: Family Medicine

## 2015-07-10 VITALS — BP 140/100 | HR 68 | Resp 18 | Ht 71.0 in | Wt 208.0 lb

## 2015-07-10 DIAGNOSIS — M5410 Radiculopathy, site unspecified: Secondary | ICD-10-CM

## 2015-07-10 DIAGNOSIS — K219 Gastro-esophageal reflux disease without esophagitis: Secondary | ICD-10-CM

## 2015-07-10 DIAGNOSIS — E663 Overweight: Secondary | ICD-10-CM

## 2015-07-10 DIAGNOSIS — R490 Dysphonia: Secondary | ICD-10-CM

## 2015-07-10 DIAGNOSIS — Z13220 Encounter for screening for lipoid disorders: Secondary | ICD-10-CM | POA: Diagnosis not present

## 2015-07-10 DIAGNOSIS — Z125 Encounter for screening for malignant neoplasm of prostate: Secondary | ICD-10-CM | POA: Diagnosis not present

## 2015-07-10 DIAGNOSIS — I10 Essential (primary) hypertension: Secondary | ICD-10-CM

## 2015-07-10 DIAGNOSIS — J3089 Other allergic rhinitis: Secondary | ICD-10-CM

## 2015-07-10 MED ORDER — AMLODIPINE BESYLATE 2.5 MG PO TABS: 2.5000 mg | ORAL_TABLET | Freq: Every day | ORAL | Status: DC

## 2015-07-10 MED ORDER — AMLODIPINE BESYLATE 5 MG PO TABS: 5.0000 mg | ORAL_TABLET | Freq: Every day | ORAL | Status: DC

## 2015-07-10 MED ORDER — GABAPENTIN 100 MG PO CAPS: 100.0000 mg | ORAL_CAPSULE | Freq: Every day | ORAL | Status: DC

## 2015-07-10 NOTE — Patient Instructions (Signed)
Physical exam Sept 29 or after , call if you need me sooner  Continue good health habits  NEW DOSE amlodipine is 7.5 mg daily, BP is high  For back pain commit to one tylenol 500 mg once daily and new is gabapentin one at bedtime, hydrocodone dose unchanged,monitor exercise   You are referred to Dr Benjamine Mola re chronic painless hoarseness-  Thank you  for choosing [facility]. We consider it a privelige to serve you.  Delivering excellent health care in a caring and  compassionate way is our goal.  Partnering with you,  so that together we can achieve this goal is our strategy.

## 2015-07-10 NOTE — Progress Notes (Signed)
   Subjective:    Patient ID: Evan Jacobson, male    DOB: July 23, 1965, 50 y.o.   MRN: [id number]  HPI   Evan Jacobson     MRN: [id number]      DOB: [birth year]   HPI Evan Jacobson is here for follow up and re-evaluation of chronic medical conditions, medication management and review of any available recent lab and radiology data.  Preventive health is updated, specifically  Cancer screening and Immunization.   Questions or concerns regarding consultations or procedures which the PT has had in the interim are  addressed. The PT denies any adverse reactions to current medications since the last visit.  C/o increased back pain with increased activity, requests attention to pain management C/o chronic painless hoarseness   ROS Denies recent fever or chills. Denies sinus pressure, nasal congestion, ear pain or sore throat. Denies chest congestion, productive cough or wheezing. Denies chest pains, palpitations and leg swelling Denies abdominal pain, nausea, vomiting,diarrhea or constipation.   Denies dysuria, frequency, hesitancy or incontinence. Denies joint pain, swelling and limitation in mobility. Denies headaches, seizures, numbness, or tingling. Denies depression, anxiety or insomnia. Denies skin break down or rash.   PE  BP 140/100 mmHg  Pulse 68  Resp 18  Ht 5\' 11"  (1.803 m)  Wt 208 lb (94.348 kg)  BMI 29.02 kg/m2  SpO2 99%  Patient alert and oriented and in no cardiopulmonary distress.  HEENT: No facial asymmetry, EOMI,   oropharynx pink and moist.  Neck supple no JVD, no mass.  Chest: Clear to auscultation bilaterally.  CVS: S1, S2 no murmurs, no S3.Regular rate.  ABD: Soft non tender.   Ext: No edema  MS: Adequate ROM spine, shoulders, hips and knees.  Skin: Intact, no ulcerations or rash noted.  Psych: Good eye contact, normal affect. Memory intact not anxious or depressed appearing.  CNS: CN 2-12 intact, power,  normal throughout.no focal deficits  noted.   Assessment & Plan   Essential hypertension Uncontrolled DASH diet and commitment to daily physical activity for a minimum of 30 minutes discussed and encouraged, as a part of hypertension management. The importance of attaining a healthy weight is also discussed.  BP/Weight 07/10/2015 04/09/2015 03/12/2015 02/26/2015 12/12/2014 0000000 [id number]  Systolic BP XX123456 0000000 0000000 [id number] 0000000 [id number]  Diastolic BP 123XX123 82 88 94 72 86 78  Wt. (Lbs) 208 218 215 221 - 211.4 206  BMI 29.02 29.56 30 29.97 - 28.66 27.93      Med change  Allergic rhinitis Controlled, no change in medication   Back pain with left-sided radiculopathy Reports increased pain with increased physical activity, recommend addition of tylenol, and gabapentin and reduce exercise duration from 60 to 30  mins inc as tolerated No change in hydrocodone dose  GERD Chronic painless hoarseness and inconsistent use of Zantac needs ENT eval  Overweight (BMI 25.0-29.9) Improved. Pt applauded on succesful weight loss through lifestyle change, and encouraged to continue same. Weight loss goal set for the next several months.        Review of Systems     Objective:   Physical Exam        Assessment & Plan:

## 2015-07-10 NOTE — Assessment & Plan Note (Signed)
Uncontrolled DASH diet and commitment to daily physical activity for a minimum of 30 minutes discussed and encouraged, as a part of hypertension management. The importance of attaining a healthy weight is also discussed.  BP/Weight 07/10/2015 04/09/2015 03/12/2015 02/26/2015 12/12/2014 0000000 [id number]  Systolic BP XX123456 0000000 0000000 [id number] 0000000 [id number]  Diastolic BP 123XX123 82 88 94 72 86 78  Wt. (Lbs) 208 218 215 221 - 211.4 206  BMI 29.02 29.56 30 29.97 - 28.66 27.93      Med change

## 2015-07-11 ENCOUNTER — Encounter: Payer: Self-pay | Admitting: Family Medicine

## 2015-07-11 NOTE — Assessment & Plan Note (Signed)
Chronic painless hoarseness and inconsistent use of Zantac needs ENT eval

## 2015-07-11 NOTE — Assessment & Plan Note (Signed)
Improved. Pt applauded on succesful weight loss through lifestyle change, and encouraged to continue same. Weight loss goal set for the next several months.  

## 2015-07-11 NOTE — Assessment & Plan Note (Signed)
Controlled, no change in medication  

## 2015-07-11 NOTE — Assessment & Plan Note (Signed)
Reports increased pain with increased physical activity, recommend addition of tylenol, and gabapentin and reduce exercise duration from 60 to 30  mins inc as tolerated No change in hydrocodone dose

## 2015-07-18 ENCOUNTER — Ambulatory Visit: Payer: 59

## 2015-07-18 VITALS — BP 126/92

## 2015-07-18 DIAGNOSIS — I10 Essential (primary) hypertension: Secondary | ICD-10-CM

## 2015-07-19 NOTE — Progress Notes (Signed)
Patient states that his blood pressure continues to run high.  He has been unable to get extra dose of Amlodipine 2.5mg .  Called pharmacy and confirmed that insurance will not cover yet.  He will take 1/2 of 5mg  in addition to a whole 5mg  dose.   Will keep next scheduled office visit.

## 2015-07-25 ENCOUNTER — Other Ambulatory Visit: Payer: Self-pay

## 2015-07-25 MED ORDER — HYDROCODONE-ACETAMINOPHEN 10-325 MG PO TABS: 1.0000 | ORAL_TABLET | Freq: Two times a day (BID) | ORAL | Status: DC | PRN

## 2015-08-02 ENCOUNTER — Ambulatory Visit (INDEPENDENT_AMBULATORY_CARE_PROVIDER_SITE_OTHER): Payer: Self-pay | Admitting: Otolaryngology

## 2015-08-28 ENCOUNTER — Other Ambulatory Visit: Payer: Self-pay

## 2015-08-28 MED ORDER — HYDROCODONE-ACETAMINOPHEN 10-325 MG PO TABS: 1.0000 | ORAL_TABLET | Freq: Two times a day (BID) | ORAL | 0 refills | Status: DC | PRN

## 2015-09-11 ENCOUNTER — Other Ambulatory Visit: Payer: Self-pay

## 2015-09-11 DIAGNOSIS — I10 Essential (primary) hypertension: Secondary | ICD-10-CM

## 2015-09-11 MED ORDER — AMLODIPINE BESYLATE 5 MG PO TABS: 5.0000 mg | ORAL_TABLET | Freq: Every day | ORAL | 4 refills | Status: DC

## 2015-09-11 MED ORDER — AMLODIPINE BESYLATE 2.5 MG PO TABS: 2.5000 mg | ORAL_TABLET | Freq: Every day | ORAL | 4 refills | Status: DC

## 2015-09-20 ENCOUNTER — Other Ambulatory Visit: Payer: Self-pay

## 2015-09-20 MED ORDER — HYDROCODONE-ACETAMINOPHEN 10-325 MG PO TABS: 1.0000 | ORAL_TABLET | Freq: Two times a day (BID) | ORAL | 0 refills | Status: DC | PRN

## 2015-09-26 ENCOUNTER — Telehealth: Payer: Self-pay | Admitting: Family Medicine

## 2015-09-26 ENCOUNTER — Ambulatory Visit (INDEPENDENT_AMBULATORY_CARE_PROVIDER_SITE_OTHER): Payer: 59

## 2015-09-26 DIAGNOSIS — M5410 Radiculopathy, site unspecified: Secondary | ICD-10-CM | POA: Diagnosis not present

## 2015-09-26 MED ORDER — METHYLPREDNISOLONE ACETATE 80 MG/ML IJ SUSP
80.0000 mg | Freq: Once | INTRAMUSCULAR | Status: AC
  Administered 2015-09-26: 80 mg via INTRAMUSCULAR

## 2015-09-26 MED ORDER — KETOROLAC TROMETHAMINE 60 MG/2ML IM SOLN
60.0000 mg | Freq: Once | INTRAMUSCULAR | Status: AC
  Administered 2015-09-26: 60 mg via INTRAMUSCULAR

## 2015-09-26 NOTE — Telephone Encounter (Signed)
Patient aware and will come in for nurse visit.

## 2015-09-26 NOTE — Telephone Encounter (Signed)
OK depo medrol 80 mg iM offeter toradol 60 mg iM as well, and also pred 5 mg dose pack  To be sent since appears to be "unable to move, let me klnwo if he wants that I will send

## 2015-09-26 NOTE — Telephone Encounter (Signed)
Pt has been bed ridden since Sunday and wants a prednisone shot... Was wondering if Dr. Moshe Cipro would do that for him.

## 2015-09-26 NOTE — Progress Notes (Addendum)
Patient in for injections for back pain.  Injections given as ordered.  Patient does not want to take prednisone dose pk.  Will call for appointment if continues to have other problems.  Patient states that flare started on 8/27

## 2015-09-26 NOTE — Telephone Encounter (Signed)
Please advise 

## 2015-10-12 ENCOUNTER — Other Ambulatory Visit: Payer: Self-pay

## 2015-10-12 MED ORDER — HYDROCODONE-ACETAMINOPHEN 10-325 MG PO TABS: 1.0000 | ORAL_TABLET | Freq: Two times a day (BID) | ORAL | 0 refills | Status: DC | PRN

## 2015-11-05 ENCOUNTER — Telehealth: Payer: Self-pay

## 2015-11-05 NOTE — Telephone Encounter (Signed)
Error

## 2015-11-13 ENCOUNTER — Other Ambulatory Visit: Payer: Self-pay

## 2015-11-13 DIAGNOSIS — I10 Essential (primary) hypertension: Secondary | ICD-10-CM

## 2015-11-13 MED ORDER — AMLODIPINE BESYLATE 5 MG PO TABS: 5.0000 mg | ORAL_TABLET | Freq: Every day | ORAL | 4 refills | Status: DC

## 2015-11-13 MED ORDER — AMLODIPINE BESYLATE 2.5 MG PO TABS: 2.5000 mg | ORAL_TABLET | Freq: Every day | ORAL | 4 refills | Status: DC

## 2015-11-14 ENCOUNTER — Other Ambulatory Visit: Payer: Self-pay

## 2015-11-14 DIAGNOSIS — I10 Essential (primary) hypertension: Secondary | ICD-10-CM

## 2015-11-14 MED ORDER — AMLODIPINE BESYLATE 2.5 MG PO TABS: 7.5000 mg | ORAL_TABLET | Freq: Every day | ORAL | 0 refills | Status: DC

## 2015-11-26 ENCOUNTER — Other Ambulatory Visit: Payer: Self-pay

## 2015-11-26 MED ORDER — HYDROCODONE-ACETAMINOPHEN 10-325 MG PO TABS: 1.0000 | ORAL_TABLET | Freq: Two times a day (BID) | ORAL | 0 refills | Status: DC | PRN

## 2015-12-05 ENCOUNTER — Ambulatory Visit (INDEPENDENT_AMBULATORY_CARE_PROVIDER_SITE_OTHER): Payer: 59 | Admitting: Family Medicine

## 2015-12-05 ENCOUNTER — Encounter: Payer: Self-pay | Admitting: Family Medicine

## 2015-12-05 VITALS — BP 118/78 | HR 80 | Temp 98.9°F | Resp 18 | Ht 71.0 in | Wt 206.1 lb

## 2015-12-05 DIAGNOSIS — M5410 Radiculopathy, site unspecified: Secondary | ICD-10-CM | POA: Diagnosis not present

## 2015-12-05 DIAGNOSIS — F4320 Adjustment disorder, unspecified: Secondary | ICD-10-CM | POA: Insufficient documentation

## 2015-12-05 HISTORY — DX: Adjustment disorder, unspecified: F43.20

## 2015-12-05 MED ORDER — METHYLPREDNISOLONE ACETATE 80 MG/ML IJ SUSP
80.0000 mg | Freq: Once | INTRAMUSCULAR | Status: AC
  Administered 2015-12-05: 80 mg via INTRAMUSCULAR

## 2015-12-05 MED ORDER — KETOROLAC TROMETHAMINE 60 MG/2ML IM SOLN
60.0000 mg | Freq: Once | INTRAMUSCULAR | Status: AC
  Administered 2015-12-05: 60 mg via INTRAMUSCULAR

## 2015-12-05 NOTE — Patient Instructions (Signed)
Activity as tolerated Ice or heat to back for pain Continue pain medicine and cyclobenzaprine as needed  Call if not improving in a couple of days  Consider physical therapy and new MRI/back specialist if pain persists

## 2015-12-05 NOTE — Progress Notes (Signed)
Chief Complaint  Patient presents with  . Back Pain    x 24 hours   Here for acute back pain Woke up yesterday with severe pain in low back that radiates into both buttocks No weakness or numbness.  No bowel or bladder complaint. No injury or overuse He took cyclobenzaprine and hydrocodone for the pain, no improvement  Longstanding back condition with known disc disease.  Old imaging reviewed.  We discussed referral for physical therapy or back specialty consult if pain persists or is recurrent to affect quality of life.  He went to the [facility] hospital for evaluation and is diagnosed with chronic adjustment disorder in place of PTSD or major depression and he wishes the medical record to reflect this. He plans to pursue the recommended counseling.  Patient Active Problem List   Diagnosis Date Noted  . Persistent adjustment disorder 12/05/2015  . Groin pain, chronic, right 06/28/2014  . Overweight (BMI 25.0-29.9) 05/20/2014  . Back pain with left-sided radiculopathy 01/16/2014  . Essential hypertension 06/19/2008  . ERECTILE DYSFUNCTION 11/07/2007  . Allergic rhinitis 08/13/2007  . GERD 08/13/2007    Outpatient Encounter Prescriptions as of 12/05/2015  Medication Sig  . amLODipine (NORVASC) 2.5 MG tablet Take 3 tablets (7.5 mg total) by mouth daily.  . fluticasone (FLONASE) 50 MCG/ACT nasal spray Place 2 sprays into both nostrils daily as needed for allergies or rhinitis.  Marland Kitchen HYDROcodone-acetaminophen (NORCO) 10-325 MG tablet Take 1 tablet by mouth 2 (two) times daily as needed.  Marland Kitchen KRILL OIL PO Take 1 capsule by mouth daily.  Marland Kitchen loratadine (CLARITIN) 10 MG tablet Take 1 tablet (10 mg total) by mouth daily.  . Multiple Vitamin (MULTIVITAMIN WITH MINERALS) TABS tablet Take 1 tablet by mouth daily.  Marland Kitchen gabapentin (NEURONTIN) 100 MG capsule Take 1 capsule (100 mg total) by mouth at bedtime. (Patient not taking: Reported on 12/05/2015)   Facility-Administered Encounter Medications as of  12/05/2015  Medication  . ketorolac (TORADOL) injection 60 mg  . methylPREDNISolone acetate (DEPO-MEDROL) injection 80 mg    Allergies  Allergen Reactions  . Ace Inhibitors Other (See Comments)    Feels as though throat is closing up intermittently  . Sulfonamide Derivatives Other (See Comments)    Tongue swelled.     Review of Systems  Constitutional: Positive for activity change. Negative for appetite change, chills and fever.  Eyes: Negative for photophobia and visual disturbance.  Cardiovascular: Negative for chest pain and palpitations.  Gastrointestinal: Negative for constipation and diarrhea.  Genitourinary: Negative for difficulty urinating and frequency.  Musculoskeletal: Positive for arthralgias, back pain and gait problem.       Pain low back into both buttocks.  Also complains of chronic knee pain  Neurological: Negative for dizziness, weakness and numbness.  Psychiatric/Behavioral: Positive for sleep disturbance. Negative for dysphoric mood. The patient is not nervous/anxious.     BP 118/78 (BP Location: Right Arm, Patient Position: Sitting, Cuff Size: Normal)   Pulse 80   Temp 98.9 F (37.2 C) (Oral)   Resp 18   Ht 5\' 11"  (1.803 m)   Wt 206 lb 1.3 oz (93.5 kg)   SpO2 99%   BMI 28.74 kg/m   Physical Exam  Constitutional: He is oriented to person, place, and time. He appears well-developed and well-nourished. He appears distressed.  Moderate distress due to discomfort.  Guarded movements  HENT:  Head: Normocephalic and atraumatic.  Mouth/Throat: Oropharynx is clear and moist.  Eyes: Conjunctivae are normal. Pupils are equal,  round, and reactive to light.  Neck: Normal range of motion. Neck supple.  Musculoskeletal: Normal range of motion. He exhibits no edema.  Very limited ROM of back.  Can stand on toes and heels and deep knee bend.  Diffuse pain with palpation.  SLR positive for back pain bilat.  No focal neuro findings.  Reflexes preserved  Neurological:  He is alert and oriented to person, place, and time. He displays normal reflexes. No sensory deficit.  Psychiatric: He has a normal mood and affect. His behavior is normal. Thought content normal.    ASSESSMENT/PLAN:  1. Back pain with left-sided radiculopathy  - methylPREDNISolone acetate (DEPO-MEDROL) injection 80 mg; Inject 1 mL (80 mg total) into the muscle once. - ketorolac (TORADOL) injection 60 mg; Inject 2 mLs (60 mg total) into the muscle once.   Patient Instructions  Activity as tolerated Ice or heat to back for pain Continue pain medicine and cyclobenzaprine as needed  Call if not improving in a couple of days  Consider physical therapy and new MRI/back specialist if pain persists     Raylene Everts, MD

## 2015-12-24 ENCOUNTER — Encounter: Payer: Self-pay | Admitting: Family Medicine

## 2015-12-24 ENCOUNTER — Ambulatory Visit (INDEPENDENT_AMBULATORY_CARE_PROVIDER_SITE_OTHER): Payer: 59 | Admitting: Family Medicine

## 2015-12-24 VITALS — BP 124/80 | HR 83 | Resp 16 | Ht 71.0 in | Wt 205.0 lb

## 2015-12-24 DIAGNOSIS — F4320 Adjustment disorder, unspecified: Secondary | ICD-10-CM | POA: Diagnosis not present

## 2015-12-24 DIAGNOSIS — I10 Essential (primary) hypertension: Secondary | ICD-10-CM | POA: Diagnosis not present

## 2015-12-24 DIAGNOSIS — Z1211 Encounter for screening for malignant neoplasm of colon: Secondary | ICD-10-CM

## 2015-12-24 DIAGNOSIS — M5410 Radiculopathy, site unspecified: Secondary | ICD-10-CM

## 2015-12-24 DIAGNOSIS — Z0000 Encounter for general adult medical examination without abnormal findings: Secondary | ICD-10-CM

## 2015-12-24 DIAGNOSIS — Z125 Encounter for screening for malignant neoplasm of prostate: Secondary | ICD-10-CM

## 2015-12-24 LAB — HEMOCCULT GUIAC POC 1CARD (OFFICE): Fecal Occult Blood, POC: NEGATIVE

## 2015-12-24 MED ORDER — ACETAMINOPHEN 500 MG PO TABS: ORAL_TABLET | ORAL | 3 refills | Status: DC

## 2015-12-24 MED ORDER — KETOROLAC TROMETHAMINE 60 MG/2ML IM SOLN
60.0000 mg | Freq: Once | INTRAMUSCULAR | Status: AC
  Administered 2015-12-24: 60 mg via INTRAMUSCULAR

## 2015-12-24 MED ORDER — HYDROCODONE-ACETAMINOPHEN 5-325 MG PO TABS: ORAL_TABLET | ORAL | 0 refills | Status: DC

## 2015-12-24 MED ORDER — CYCLOBENZAPRINE HCL 10 MG PO TABS: 10.0000 mg | ORAL_TABLET | Freq: Every day | ORAL | 3 refills | Status: DC

## 2015-12-24 MED ORDER — IBUPROFEN 800 MG PO TABS
ORAL_TABLET | ORAL | 3 refills | Status: DC
Start: 2015-12-24 — End: 2016-06-18

## 2015-12-24 MED ORDER — PREDNISONE 5 MG (21) PO TBPK: 5.0000 mg | ORAL_TABLET | ORAL | 0 refills | Status: DC

## 2015-12-24 NOTE — Patient Instructions (Addendum)
F/u in 2 months, call if you need me sooner  For back pain, REDUCED dose of hydrocodone , twice daily, the PLAN iS TO STOP depending on this for chronic pain Toradol in office for pain today  Start daily back exercise, stretches , consider water aerobics and swimming  Tylenol one twice daily Ibuprofen one daily  Gabapentin one at bedtime a Flexeril one at bedtime Twice weekly massages at home are good also Take prednisone as prescribed  LABS nEED tO BE DONE         Back Exercises Introduction If you have pain in your back, do these exercises 2-3 times each day or as told by your doctor. When the pain goes away, do the exercises once each day, but repeat the steps more times for each exercise (do more repetitions). If you do not have pain in your back, do these exercises once each day or as told by your doctor. Exercises Single Knee to Chest  Do these steps 3-5 times in a row for each leg: 1. Lie on your back on a firm bed or the floor with your legs stretched out. 2. Bring one knee to your chest. 3. Hold your knee to your chest by grabbing your knee or thigh. 4. Pull on your knee until you feel a gentle stretch in your lower back. 5. Keep doing the stretch for 10-30 seconds. 6. Slowly let go of your leg and straighten it. Pelvic Tilt  Do these steps 5-10 times in a row: 1. Lie on your back on a firm bed or the floor with your legs stretched out. 2. Bend your knees so they point up to the ceiling. Your feet should be flat on the floor. 3. Tighten your lower belly (abdomen) muscles to press your lower back against the floor. This will make your tailbone point up to the ceiling instead of pointing down to your feet or the floor. 4. Stay in this position for 5-10 seconds while you gently tighten your muscles and breathe evenly. Cat-Cow  Do these steps until your lower back bends more easily: 1. Get on your hands and knees on a firm surface. Keep your hands under your shoulders,  and keep your knees under your hips. You may put padding under your knees. 2. Let your head hang down, and make your tailbone point down to the floor so your lower back is round like the back of a cat. 3. Stay in this position for 5 seconds. 4. Slowly lift your head and make your tailbone point up to the ceiling so your back hangs low (sags) like the back of a cow. 5. Stay in this position for 5 seconds. Press-Ups  Do these steps 5-10 times in a row: 1. Lie on your belly (face-down) on the floor. 2. Place your hands near your head, about shoulder-width apart. 3. While you keep your back relaxed and keep your hips on the floor, slowly straighten your arms to raise the top half of your body and lift your shoulders. Do not use your back muscles. To make yourself more comfortable, you may change where you place your hands. 4. Stay in this position for 5 seconds. 5. Slowly return to lying flat on the floor. Bridges  Do these steps 10 times in a row: 1. Lie on your back on a firm surface. 2. Bend your knees so they point up to the ceiling. Your feet should be flat on the floor. 3. Tighten your butt muscles and lift  your butt off of the floor until your waist is almost as high as your knees. If you do not feel the muscles working in your butt and the back of your thighs, slide your feet 1-2 inches farther away from your butt. 4. Stay in this position for 3-5 seconds. 5. Slowly lower your butt to the floor, and let your butt muscles relax. If this exercise is too easy, try doing it with your arms crossed over your chest. Belly Crunches  Do these steps 5-10 times in a row: 1. Lie on your back on a firm bed or the floor with your legs stretched out. 2. Bend your knees so they point up to the ceiling. Your feet should be flat on the floor. 3. Cross your arms over your chest. 4. Tip your chin a little bit toward your chest but do not bend your neck. 5. Tighten your belly muscles and slowly raise your  chest just enough to lift your shoulder blades a tiny bit off of the floor. 6. Slowly lower your chest and your head to the floor. Back Lifts  Do these steps 5-10 times in a row: 1. Lie on your belly (face-down) with your arms at your sides, and rest your forehead on the floor. 2. Tighten the muscles in your legs and your butt. 3. Slowly lift your chest off of the floor while you keep your hips on the floor. Keep the back of your head in line with the curve in your back. Look at the floor while you do this. 4. Stay in this position for 3-5 seconds. 5. Slowly lower your chest and your face to the floor. Contact a doctor if:  Your back pain gets a lot worse when you do an exercise.  Your back pain does not lessen 2 hours after you exercise. If you have any of these problems, stop doing the exercises. Do not do them again unless your doctor says it is okay. Get help right away if:  You have sudden, very bad back pain. If this happens, stop doing the exercises. Do not do them again unless your doctor says it is okay. This information is not intended to replace advice given to you by your health care provider. Make sure you discuss any questions you have with your health care provider. Document Released: 02/15/2010 Document Revised: 06/21/2015 Document Reviewed: 03/09/2014  2017 Elsevier

## 2015-12-24 NOTE — Assessment & Plan Note (Signed)
Reports being dx Sept 9, 2017

## 2015-12-24 NOTE — Assessment & Plan Note (Addendum)
Improved, however still has back spasm, needs to become motivated to regularly do stretches and therapy regularly at home, toradol 60 mg im and prednisone dose pack, change in pain management , plan to d/c hydrocodone in 3 months, reduce to 5 mg dose twice daily

## 2015-12-24 NOTE — Assessment & Plan Note (Signed)
Controlled, no change in medication DASH diet and commitment to daily physical activity for a minimum of 30 minutes discussed and encouraged, as a part of hypertension management. The importance of attaining a healthy weight is also discussed.  BP/Weight 12/24/2015 12/05/2015 07/18/2015 07/10/2015 04/09/2015 03/12/2015 [id number]  Systolic BP [id number] 123456 [id number]  Diastolic BP 80 78 92 [id number] 82 88 94  Wt. (Lbs) 205 206.08 - 208 218 215 221  BMI 28.59 28.74 - 29.02 29.56 30 29.97  Some encounter information is confidential and restricted. Go to Review Flowsheets activity to see all data.

## 2015-12-24 NOTE — Assessment & Plan Note (Signed)

## 2015-12-24 NOTE — Progress Notes (Signed)
   Evan Jacobson     MRN: [id number]      DOB: 1965/07/16   HPI: Patient is in for annual physical exam. C/o uncontrolled back pain and spasm, "wants a shot" and is unable to afford PT. Pain management and plan to wean off of hydrocodone also discussed. Immunization is reviewed , refuses flu vaccine.    PE; Pleasant male, alert and oriented x 3, in no cardio-pulmonary distress. Afebrile. HEENT No facial trauma or asymetry. Sinuses non tender.Nasal mucosa erythematous and edematous External ears normal, tympanic membranes clear. Oropharynx moist, no exudate. Neck: supple, no adenopathy,JVD or thyromegaly.No bruits.  Chest: Clear to ascultation bilaterally.No crackles or wheezes. Non tender to palpation  Breast: No asymetry,no masses. No nipple discharge or inversion. No axillary or supraclavicular adenopathy  Cardiovascular system; Heart sounds normal,  S1 and  S2 ,no S3.  No murmur, or thrill. Apical beat not displaced Peripheral pulses normal.  Abdomen: Soft, non tender, no organomegaly or masses. No bruits. Bowel sounds normal. No guarding, tenderness or rebound.  Rectal:  Increased anal  sphincter tone. No hemorrhoids or  masses. guaiac negative stool. Prostate not palpated    Musculoskeletal exam: Decreased  ROM of lumbar  Spine, normal in  hips , shoulders and knees. No deformity ,swelling or crepitus noted. No muscle wasting or atrophy.   Neurologic: Cranial nerves 2 to 12 intact. Power, tone ,sensation and reflexes normal throughout. No disturbance in gait. No tremor.  Skin: Intact, no ulceration, erythema , scaling or rash noted. Pigmentation normal throughout  Psych; Normal mood and affect. Judgement and concentration normal   Assessment & Plan:  Persistent adjustment disorder Reports being dx Sept 9, 2017  Back pain with left-sided radiculopathy Improved, however still has back spasm, needs to become motivated to regularly do stretches  and therapy regularly at home, toradol 60 mg im and prednisone dose pack, vchange in pain management , plan to d/c hydrocodone in 3 months, reduce to once daily  Annual physical exam Annual exam as documented. Counseling done  re healthy lifestyle involving commitment to 150 minutes exercise per week, heart healthy diet, and attaining healthy weight.The importance of adequate sleep also discussed. Regular seat belt use and home safety, is also discussed. Changes in health habits are decided on by the patient with goals and time frames  set for achieving them. Immunization and cancer screening needs are specifically addressed at this visit.   Essential hypertension Controlled, no change in medication DASH diet and commitment to daily physical activity for a minimum of 30 minutes discussed and encouraged, as a part of hypertension management. The importance of attaining a healthy weight is also discussed.  BP/Weight 12/24/2015 12/05/2015 07/18/2015 07/10/2015 04/09/2015 03/12/2015 [id number]  Systolic BP A999333 123456 [id number] 0000000 0000000 [id number]  Diastolic BP 80 78 92 [id number] 82 88 94  Wt. (Lbs) 205 206.08 - 208 218 215 221  BMI 28.59 28.74 - 29.02 29.56 30 29.97  Some encounter information is confidential and restricted. Go to Review Flowsheets activity to see all data.

## 2016-01-11 ENCOUNTER — Other Ambulatory Visit: Payer: Self-pay

## 2016-01-11 MED ORDER — HYDROCODONE-ACETAMINOPHEN 5-325 MG PO TABS: ORAL_TABLET | ORAL | 0 refills | Status: DC

## 2016-02-27 ENCOUNTER — Ambulatory Visit (INDEPENDENT_AMBULATORY_CARE_PROVIDER_SITE_OTHER): Payer: 59 | Admitting: Family Medicine

## 2016-02-27 ENCOUNTER — Encounter: Payer: Self-pay | Admitting: Family Medicine

## 2016-02-27 VITALS — BP 120/88 | HR 80 | Temp 98.7°F | Resp 18 | Ht 71.0 in | Wt 208.0 lb

## 2016-02-27 DIAGNOSIS — G8929 Other chronic pain: Secondary | ICD-10-CM | POA: Diagnosis not present

## 2016-02-27 DIAGNOSIS — Z09 Encounter for follow-up examination after completed treatment for conditions other than malignant neoplasm: Secondary | ICD-10-CM | POA: Insufficient documentation

## 2016-02-27 DIAGNOSIS — I10 Essential (primary) hypertension: Secondary | ICD-10-CM | POA: Diagnosis not present

## 2016-02-27 DIAGNOSIS — E663 Overweight: Secondary | ICD-10-CM | POA: Diagnosis not present

## 2016-02-27 DIAGNOSIS — F4320 Adjustment disorder, unspecified: Secondary | ICD-10-CM

## 2016-02-27 DIAGNOSIS — M5410 Radiculopathy, site unspecified: Secondary | ICD-10-CM

## 2016-02-27 MED ORDER — HYDROCODONE-ACETAMINOPHEN 5-325 MG PO TABS: ORAL_TABLET | ORAL | 0 refills | Status: DC

## 2016-02-27 MED ORDER — GABAPENTIN 100 MG PO CAPS: 100.0000 mg | ORAL_CAPSULE | Freq: Every day | ORAL | 5 refills | Status: DC

## 2016-02-27 NOTE — Progress Notes (Signed)
Evan Jacobson     MRN: [id number]      DOB: 1965/06/21   HPI Evan Jacobson is here for follow up and re-evaluation of chronic medical conditions, medication management and review of any available recent lab and radiology data.  Preventive health is updated, specifically  Cancer screening and Immunization.   Pt reports chronic back pain rated at a 6, frustrated by pill load and takes medication as he feels like. Being treated by psych at the [facility] and also not consistent in taking psych meds, frustrated today states somes meds are with food others without , he is confused and does what he wants with his medications, reportedly , states however that he is listening to his wife and working on changing this behavior .    ROS Denies recent fever or chills. Denies sinus pressure, nasal congestion, ear pain or sore throat. Denies chest congestion, productive cough or wheezing. Denies chest pains, palpitations and leg swelling Denies abdominal pain, nausea, vomiting,diarrhea or constipation.   Denies dysuria, frequency, hesitancy or incontinence. C/o chronic  joint pain,  and limitation in mobility. Denies skin break down or rash. C/o testicular pain which is not new, he has seen urology in the past ans recently advised by a Provider at the [facility] to see a neurologist about this   PE  BP 120/88 (BP Location: Left Arm, Patient Position: Sitting, Cuff Size: Normal)   Pulse 80   Temp 98.7 F (37.1 C) (Oral)   Resp 18   Ht 5\' 11"  (1.803 m)   Wt 208 lb (94.3 kg)   SpO2 99%   BMI 29.01 kg/m   Patient alert and oriented and in no cardiopulmonary distress.  HEENT: No facial asymmetry, EOMI,   oropharynx pink and moist.  Neck supple no JVD, no mass.  Chest: Clear to auscultation bilaterally.  CVS: S1, S2 no murmurs, no S3.Regular rate.  ABD: Soft non tender.   Ext: No edema  MS: Decreased  ROM spine, adequate in  shoulders, hips and knees.  Skin: Intact, no ulcerations or rash  noted.  Psych: Good eye contact, normal affect. Memory intact not anxious or depressed appearing.  CNS: CN 2-12 intact, power,  normal throughout.no focal deficits noted.   Assessment & Plan  Essential hypertension Controlled, no change in medication DASH diet and commitment to daily physical activity for a minimum of 30 minutes discussed and encouraged, as a part of hypertension management. The importance of attaining a healthy weight is also discussed.  BP/Weight 02/27/2016 12/24/2015 12/05/2015 07/18/2015 07/10/2015 04/09/2015 [id number]  Systolic BP 123456 [id number] 123456 [id number] 0000000  Diastolic BP 88 80 78 92 [id number] 82 88  Wt. (Lbs) 208 205 206.08 - 208 218 215  BMI 29.01 28.59 28.74 - 29.02 29.56 30  Some encounter information is confidential and restricted. Go to Review Flowsheets activity to see all data.       Overweight (BMI 25.0-29.9) Deteriorated. Patient re-educated about  the importance of commitment to a  minimum of 150 minutes of exercise per week.  The importance of healthy food choices with portion control discussed. Encouraged to start a food diary, count calories and to consider  joining a support group. Sample diet sheets offered. Goals set by the patient for the next several months.   Weight /BMI 02/27/2016 12/24/2015 12/05/2015  WEIGHT 208 lb 205 lb 206 lb 1.3 oz  HEIGHT 5\' 11"  5\' 11"  5\' 11"   BMI 29.01 kg/m2 28.59 kg/m2 28.74 kg/m2  Some  encounter information is confidential and restricted. Go to Review Flowsheets activity to see all data.      Back pain with left-sided radiculopathy Pain control and managed reviewed and discussed. Pt to commit to gabapentin at bedtime , and topical massage with biofreeze as an adjunct to the hydrocodone    Encounter for chronic pain management Pain contract reviewed and signed No change in hydrocodone dosing , which is a reduced dose. Pt to commit to bed time gabapentin and topical rub also.   Persistent adjustment  disorder Pt being treated through the [place] for mental health illness

## 2016-02-27 NOTE — Assessment & Plan Note (Signed)
Pt being treated through the [place] for mental health illness

## 2016-02-27 NOTE — Assessment & Plan Note (Signed)
Deteriorated. Patient re-educated about  the importance of commitment to a  minimum of 150 minutes of exercise per week.  The importance of healthy food choices with portion control discussed. Encouraged to start a food diary, count calories and to consider  joining a support group. Sample diet sheets offered. Goals set by the patient for the next several months.   Weight /BMI 02/27/2016 12/24/2015 12/05/2015  WEIGHT 208 lb 205 lb 206 lb 1.3 oz  HEIGHT 5\' 11"  5\' 11"  5\' 11"   BMI 29.01 kg/m2 28.59 kg/m2 28.74 kg/m2  Some encounter information is confidential and restricted. Go to Review Flowsheets activity to see all data.

## 2016-02-27 NOTE — Assessment & Plan Note (Signed)
Pain control and managed reviewed and discussed. Pt to commit to gabapentin at bedtime , and topical massage with biofreeze as an adjunct to the hydrocodone

## 2016-02-27 NOTE — Patient Instructions (Addendum)
F/u in 3 month, call if you need me sooner  Pls call if you need referral to urology as discussed  Labs today  Thank you  for choosing [facility]. We consider it a privelige to serve you.  Delivering excellent health care in a caring and  compassionate way is our goal.  Partnering with you,  so that together we can achieve this goal is our strategy.       Pain contract and management as we discussed.  Please start gabapentin one at bedtime t this will help with your pain  Biofreeze, is  a great topical rub to use regularly massaging your back   Please work on good  health habits so that your health will improve. 1. Commitment to daily physical activity for 30 to 60  minutes, if you are able to do this.  2. Commitment to wise food choices. Aim for half of your  food intake to be vegetable and fruit, one quarter starchy foods, and one quarter protein. Try to eat on a regular schedule  3 meals per day, snacking between meals should be limited to vegetables or fruits or small portions of nuts. 64 ounces of water per day is generally recommended, unless you have specific health conditions, like heart failure or kidney failure where you will need to limit fluid intake.  3. Commitment to sufficient and a  good quality of physical and mental rest daily, generally between 6 to 8 hours per day.  WITH PERSISTANCE AND PERSEVERANCE, THE IMPOSSIBLE , BECOMES THE NORM!

## 2016-02-27 NOTE — Assessment & Plan Note (Signed)
Controlled, no change in medication DASH diet and commitment to daily physical activity for a minimum of 30 minutes discussed and encouraged, as a part of hypertension management. The importance of attaining a healthy weight is also discussed.  BP/Weight 02/27/2016 12/24/2015 12/05/2015 07/18/2015 07/10/2015 04/09/2015 [id number]  Systolic BP 123456 [id number] 123456 [id number]  Diastolic BP 88 80 78 92 [id number] 82 88  Wt. (Lbs) 208 205 206.08 - 208 218 215  BMI 29.01 28.59 28.74 - 29.02 29.56 30  Some encounter information is confidential and restricted. Go to Review Flowsheets activity to see all data.

## 2016-02-27 NOTE — Assessment & Plan Note (Signed)
Pain contract reviewed and signed No change in hydrocodone dosing , which is a reduced dose. Pt to commit to bed time gabapentin and topical rub also.

## 2016-02-28 ENCOUNTER — Other Ambulatory Visit: Payer: Self-pay | Admitting: Family Medicine

## 2016-02-28 DIAGNOSIS — I10 Essential (primary) hypertension: Secondary | ICD-10-CM

## 2016-03-05 ENCOUNTER — Telehealth: Payer: Self-pay

## 2016-03-05 ENCOUNTER — Other Ambulatory Visit: Payer: Self-pay | Admitting: Family Medicine

## 2016-03-05 NOTE — Telephone Encounter (Signed)
Noted, scripts recived , and  Are being destroyed, alternative pain management options will be offered if he calls in re pain

## 2016-03-27 ENCOUNTER — Ambulatory Visit (INDEPENDENT_AMBULATORY_CARE_PROVIDER_SITE_OTHER): Payer: 59 | Admitting: Family Medicine

## 2016-03-27 ENCOUNTER — Encounter: Payer: Self-pay | Admitting: Family Medicine

## 2016-03-27 VITALS — BP 138/96 | HR 92 | Resp 15 | Ht 71.0 in | Wt 211.8 lb

## 2016-03-27 DIAGNOSIS — N509 Disorder of male genital organs, unspecified: Secondary | ICD-10-CM | POA: Diagnosis not present

## 2016-03-27 DIAGNOSIS — I10 Essential (primary) hypertension: Secondary | ICD-10-CM | POA: Diagnosis not present

## 2016-03-27 DIAGNOSIS — E663 Overweight: Secondary | ICD-10-CM

## 2016-03-27 DIAGNOSIS — N50819 Testicular pain, unspecified: Secondary | ICD-10-CM

## 2016-03-27 DIAGNOSIS — M5410 Radiculopathy, site unspecified: Secondary | ICD-10-CM | POA: Diagnosis not present

## 2016-03-27 MED ORDER — HYDROCODONE-ACETAMINOPHEN 10-325 MG PO TABS: ORAL_TABLET | ORAL | 0 refills | Status: DC

## 2016-03-27 NOTE — Patient Instructions (Addendum)
F/u in 3 months, call if you need me before  New dose of medication for chronic pain following contract as before  You are referred to urology, the office will call   Please work on good  health habits so that your health will improve. 1. Commitment to daily physical activity for 30 to 60  minutes, if you are able to do this.  2. Commitment to wise food choices. Aim for half of your  food intake to be vegetable and fruit, one quarter starchy foods, and one quarter protein. Try to eat on a regular schedule  3 meals per day, snacking between meals should be limited to vegetables or fruits or small portions of nuts. 64 ounces of water per day is generally recommended, unless you have specific health conditions, like heart failure or kidney failure where you will need to limit fluid intake.  3. Commitment to sufficient and a  good quality of physical and mental rest daily, generally between 6 to 8 hours per day.  WITH PERSISTANCE AND PERSEVERANCE, THE IMPOSSIBLE , BECOMES THE NORM!

## 2016-03-27 NOTE — Assessment & Plan Note (Addendum)
Throbbing Pain episodes approx 5 times per day 30 mins each episode for past at least 2 month affecting  Both testicles,  Shocking type pain,has been advised that may be related to spine problems, needs urology evaluation to support this view, refer to [facility]

## 2016-03-28 ENCOUNTER — Ambulatory Visit (INDEPENDENT_AMBULATORY_CARE_PROVIDER_SITE_OTHER): Payer: 59 | Admitting: Urology

## 2016-03-28 DIAGNOSIS — M545 Low back pain: Secondary | ICD-10-CM

## 2016-03-28 DIAGNOSIS — N50819 Testicular pain, unspecified: Secondary | ICD-10-CM

## 2016-03-28 DIAGNOSIS — N529 Male erectile dysfunction, unspecified: Secondary | ICD-10-CM

## 2016-03-28 DIAGNOSIS — M5136 Other intervertebral disc degeneration, lumbar region: Secondary | ICD-10-CM

## 2016-03-29 ENCOUNTER — Encounter: Payer: Self-pay | Admitting: Family Medicine

## 2016-03-29 NOTE — Assessment & Plan Note (Signed)
Uncontrolled at visit, no med change, pt in pain and has been drinking increased amt of alcohol, last drink over 18 hrs prior DASH diet and commitment to daily physical activity for a minimum of 30 minutes discussed and encouraged, as a part of hypertension management. The importance of attaining a healthy weight is also discussed.  BP/Weight 03/27/2016 02/27/2016 12/24/2015 12/05/2015 07/18/2015 07/10/2015 0000000  Systolic BP 0000000 123456 [id number] 123456 [id number] 0000000  Diastolic BP 96 88 80 78 92 100 82  Wt. (Lbs) 211.8 208 205 206.08 - 208 218  BMI 29.54 29.01 28.59 28.74 - 29.02 29.56  Some encounter information is confidential and restricted. Go to Review Flowsheets activity to see all data.

## 2016-03-29 NOTE — Assessment & Plan Note (Signed)
Uncontrolled and disabling pain reported, new pain management adopted,  And contract as before Pt also encouraged to commit to gabapentin, exercise , massage and weight loss

## 2016-03-29 NOTE — Progress Notes (Signed)
Evan Jacobson     MRN: [id number]      DOB: [birth year]   HPI Evan Jacobson is here for follow up of uncontrolled and currently untreated back pain.At his last visit , an attempt was made and the plan was that his narcotic pain med would be decreased, he returned the scripts without filling within the following week, stating he was not going to take the med as it would not work. He is in today expressing his displeasure at treatment plan, admiting he never started the gabapentin, has been drinking more alcohol and has become  less active because of untreated pain.He has gained weight Also requests urology referral to evaluate the link between his chronic scrotal pain and back pain, states Doc  at [facility] told him they are connected, and if this can be established it wqill be benficial to him  ROS Denies recent fever or chills. Denies sinus pressure, nasal congestion, ear pain or sore throat. Denies chest congestion, productive cough or wheezing. Denies chest pains, palpitations and leg swelling Denies abdominal pain, nausea, vomiting,diarrhea or constipation.   Denies dysuria, frequency, hesitancy or incontinence. Denies , seizures, c/o headache   numbness, and  Tingling.in legs Denies depression, anxiety or insomnia. Denies skin break down or rash.   PE  BP (!) 138/96   Pulse 92   Resp 15   Ht 5\' 11"  (1.803 m)   Wt 211 lb 12.8 oz (96.1 kg)   SpO2 98%   BMI 29.54 kg/m   Patient alert and oriented and in no cardiopulmonary distress.  HEENT: No facial asymmetry, EOMI,   oropharynx pink and moist.  Neck supple no JVD, no mass.  Chest: Clear to auscultation bilaterally.  CVS: S1, S2 no murmurs, no S3.Regular rate.  ABD: Soft non tender.   Ext: No edema  [id number]  ROM spine, normal in shoulders, hips and knees.  Skin: Intact, no ulcerations or rash noted.  Psych: Good eye contact, normal affect. Memory intact not anxious or depressed appearing.  CNS: CN 2-12 intact,  power,  normal throughout.no focal deficits noted.   Assessment & Plan  Persistent testicular pain Throbbing Pain episodes approx 5 times per day 30 mins each episode for past at least 2 month affecting  Both testicles,  Shocking type pain,has been advised that may be related to spine problems, needs urology evaluation to support this view, refer to [facility]  Back pain with left-sided radiculopathy Uncontrolled and disabling pain reported, new pain management adopted,  And contract as before Pt also encouraged to commit to gabapentin, exercise , massage and weight loss  Overweight (BMI 25.0-29.9) Deteriorated. Patient re-educated about  the importance of commitment to a  minimum of 150 minutes of exercise per week.  The importance of healthy food choices with portion control discussed. Encouraged to start a food diary, count calories and to consider  joining a support group. Sample diet sheets offered. Goals set by the patient for the next several months.   Weight /BMI 03/27/2016 02/27/2016 12/24/2015  WEIGHT 211 lb 12.8 oz 208 lb 205 lb  HEIGHT 5\' 11"  5\' 11"  5\' 11"   BMI 29.54 kg/m2 29.01 kg/m2 28.59 kg/m2  Some encounter information is confidential and restricted. Go to Review Flowsheets activity to see all data.      Essential hypertension Uncontrolled at visit, no med change, pt in pain and has been drinking increased amt of alcohol, last drink over 18 hrs prior DASH diet and commitment to daily physical activity  for a minimum of 30 minutes discussed and encouraged, as a part of hypertension management. The importance of attaining a healthy weight is also discussed.  BP/Weight 03/27/2016 02/27/2016 12/24/2015 12/05/2015 07/18/2015 07/10/2015 0000000  Systolic BP 0000000 123456 [id number] 123456 [id number] 0000000  Diastolic BP 96 88 80 78 92 100 82  Wt. (Lbs) 211.8 208 205 206.08 - 208 218  BMI 29.54 29.01 28.59 28.74 - 29.02 29.56  Some encounter information is confidential and restricted. Go to Review  Flowsheets activity to see all data.

## 2016-03-29 NOTE — Assessment & Plan Note (Signed)
Deteriorated. Patient re-educated about  the importance of commitment to a  minimum of 150 minutes of exercise per week.  The importance of healthy food choices with portion control discussed. Encouraged to start a food diary, count calories and to consider  joining a support group. Sample diet sheets offered. Goals set by the patient for the next several months.   Weight /BMI 03/27/2016 02/27/2016 12/24/2015  WEIGHT 211 lb 12.8 oz 208 lb 205 lb  HEIGHT 5\' 11"  5\' 11"  5\' 11"   BMI 29.54 kg/m2 29.01 kg/m2 28.59 kg/m2  Some encounter information is confidential and restricted. Go to Review Flowsheets activity to see all data.

## 2016-04-07 ENCOUNTER — Other Ambulatory Visit: Payer: Self-pay | Admitting: Urology

## 2016-04-07 DIAGNOSIS — N50819 Testicular pain, unspecified: Secondary | ICD-10-CM

## 2016-04-07 DIAGNOSIS — M545 Low back pain: Secondary | ICD-10-CM

## 2016-04-15 ENCOUNTER — Telehealth: Payer: Self-pay | Admitting: Family Medicine

## 2016-04-15 NOTE — Telephone Encounter (Signed)
Patient is requesting a back injection from brandi or courtney   cb#: [phone]

## 2016-04-18 NOTE — Telephone Encounter (Signed)
Called and left message to call back.

## 2016-04-18 NOTE — Telephone Encounter (Signed)
pls arrange toradol 60 mg IM anmd depo medrol 80 mg IM next Monday or any day next week  when I am in office , thanks!

## 2016-04-23 NOTE — Telephone Encounter (Signed)
Can you call patient and advise him he can come in for injections (nurse visit)  per the dr if he is still having pain.

## 2016-04-28 ENCOUNTER — Ambulatory Visit (HOSPITAL_COMMUNITY): Admission: RE | Admit: 2016-04-28 | Payer: 59 | Source: Ambulatory Visit

## 2016-05-07 ENCOUNTER — Other Ambulatory Visit: Payer: Self-pay

## 2016-05-07 DIAGNOSIS — I10 Essential (primary) hypertension: Secondary | ICD-10-CM

## 2016-05-07 MED ORDER — AMLODIPINE BESYLATE 2.5 MG PO TABS: 7.5000 mg | ORAL_TABLET | Freq: Every day | ORAL | 1 refills | Status: DC

## 2016-05-08 ENCOUNTER — Ambulatory Visit (INDEPENDENT_AMBULATORY_CARE_PROVIDER_SITE_OTHER): Payer: 59

## 2016-05-08 ENCOUNTER — Telehealth: Payer: Self-pay

## 2016-05-08 DIAGNOSIS — J301 Allergic rhinitis due to pollen: Secondary | ICD-10-CM

## 2016-05-08 DIAGNOSIS — J302 Other seasonal allergic rhinitis: Secondary | ICD-10-CM

## 2016-05-08 MED ORDER — METHYLPREDNISOLONE ACETATE 80 MG/ML IJ SUSP
80.0000 mg | Freq: Once | INTRAMUSCULAR | Status: AC
  Administered 2016-05-08: 80 mg via INTRAMUSCULAR

## 2016-05-08 NOTE — Progress Notes (Signed)
Injection given without complication

## 2016-05-08 NOTE — Telephone Encounter (Signed)
Methylprednisone 80 mg IM x 1 to be given today , nurse visit

## 2016-05-08 NOTE — Telephone Encounter (Signed)
Wants to come in to get allergy shot. States he is so congested in his sinuses he can hardly breathe at night. Has tired several things otc and nothing is helping. Please advise

## 2016-05-09 NOTE — Telephone Encounter (Signed)
Given

## 2016-05-26 ENCOUNTER — Encounter: Payer: Self-pay | Admitting: Family Medicine

## 2016-05-26 ENCOUNTER — Ambulatory Visit: Payer: 59 | Admitting: Family Medicine

## 2016-06-17 ENCOUNTER — Telehealth: Payer: Self-pay

## 2016-06-17 NOTE — Telephone Encounter (Signed)
Ok to work in in the am

## 2016-06-17 NOTE — Telephone Encounter (Signed)
Pt called asking to have tests done on his heart. States his heart has been " fluttering"

## 2016-06-18 ENCOUNTER — Encounter: Payer: Self-pay | Admitting: Family Medicine

## 2016-06-18 ENCOUNTER — Ambulatory Visit (INDEPENDENT_AMBULATORY_CARE_PROVIDER_SITE_OTHER): Payer: 59 | Admitting: Family Medicine

## 2016-06-18 ENCOUNTER — Other Ambulatory Visit: Payer: Self-pay

## 2016-06-18 VITALS — BP 120/84 | HR 79 | Resp 16 | Ht 71.0 in | Wt 220.0 lb

## 2016-06-18 DIAGNOSIS — R079 Chest pain, unspecified: Secondary | ICD-10-CM | POA: Insufficient documentation

## 2016-06-18 DIAGNOSIS — R002 Palpitations: Secondary | ICD-10-CM | POA: Diagnosis not present

## 2016-06-18 DIAGNOSIS — Z13220 Encounter for screening for lipoid disorders: Secondary | ICD-10-CM | POA: Diagnosis not present

## 2016-06-18 DIAGNOSIS — E559 Vitamin D deficiency, unspecified: Secondary | ICD-10-CM

## 2016-06-18 DIAGNOSIS — I10 Essential (primary) hypertension: Secondary | ICD-10-CM | POA: Diagnosis not present

## 2016-06-18 DIAGNOSIS — Z125 Encounter for screening for malignant neoplasm of prostate: Secondary | ICD-10-CM

## 2016-06-18 HISTORY — DX: Palpitations: R00.2

## 2016-06-18 NOTE — Assessment & Plan Note (Signed)
2 week h/o intermittent palpitations associated at times with chest pain, and light headedness, denies increased caffeine intake. ECG sinus rhythm, refer cardioogy

## 2016-06-18 NOTE — Assessment & Plan Note (Addendum)
2 week h/o new onset left chest pressure with and without activity associated at times with light headedness, non radiating, no reported nausea or diaphoresis EKG ; no acute  ischemia, however non specific t changes

## 2016-06-18 NOTE — Assessment & Plan Note (Signed)
Controlled, no change in medication DASH diet and commitment to daily physical activity for a minimum of 30 minutes discussed and encouraged, as a part of hypertension management. The importance of attaining a healthy weight is also discussed.  BP/Weight 06/18/2016 03/27/2016 02/27/2016 12/24/2015 12/05/2015 07/18/2015 05/08/8784  Systolic BP 767 209 470 962 836 629 476  Diastolic BP 84 96 88 80 78 92 100  Wt. (Lbs) 220 211.8 208 205 206.08 - 208  BMI 30.68 29.54 29.01 28.59 28.74 - 29.02  Some encounter information is confidential and restricted. Go to Review Flowsheets activity to see all data.

## 2016-06-18 NOTE — Patient Instructions (Signed)
F/u as before  Call if you need me sooner  You are referred to cardiologist for furhter evaluation  You need to get fasting CBC, lipid, cmp, PSA, TSH and vit D tomorrow  Start aspirrin 81 mg one daily, coated for heart health  Thank you  for choosing [facility]. We consider it a privelige to serve you.  Delivering excellent health care in a caring and  compassionate way is our goal.  Partnering with you,  so that together we can achieve this goal is our strategy.

## 2016-06-18 NOTE — Progress Notes (Signed)
   Evan Jacobson     MRN: [id number]      DOB: 10-Apr-1965   HPI Evan Jacobson is here with a 2 week h/o intermittent palpitations approximately 5 times per week 3 times daily, less than 5 minutes sometimes with light headedness and with left chest pressure, non radiating, no associated aggravating, or relieving factors No caffeine intake ,  no icreased stress No f/h of premature CAD. No nicotine use. ROS Denies recent fever or chills. Denies sinus pressure, nasal congestion, ear pain or sore throat. Denies chest congestion, productive cough or wheezing. Denies PND, orthopnea  and leg swelling Denies abdominal pain, nausea, vomiting,diarrhea or constipation.   Denies dysuria, frequency, hesitancy or incontinence. Denies uncontrolled  joint pain, swelling and limitation in mobility. Denies skin break down or rash.   PE  BP 120/84   Pulse 79   Resp 16   Ht 5\' 11"  (1.803 m)   Wt 220 lb (99.8 kg)   SpO2 98%   BMI 30.68 kg/m   Patient alert and oriented and in no cardiopulmonary distress.  HEENT: No facial asymmetry, EOMI,   oropharynx pink and moist.  Neck supple no JVD, no mass.  Chest: Clear to auscultation bilaterally.no reproducible chest pain  CVS: S1, S2 no murmurs, no S3.Regular rate.  EKG : non specific T wave abnormality ABD: Soft non tender.   Ext: No edema  MS: Adequate ROM spine, shoulders, hips and knees.  Skin: Intact, no ulcerations or rash noted.  Psych: Good eye contact, normal affect. Memory intact not anxious or depressed appearing.  CNS: CN 2-12 intact, power,  normal throughout.no focal deficits noted.   Assessment & Plan  Chest pain at rest 2 week h/o new onset left chest pressure with and without activity associated at times with light headedness, non radiating, no reported nausea or diaphoresis EKG ; no acute  ischemia, however non specific t changes  Heart palpitations 2 week h/o intermittent palpitations associated at times with chest  pain, and light headedness, denies increased caffeine intake. ECG sinus rhythm, refer cardioogy  Essential hypertension Controlled, no change in medication DASH diet and commitment to daily physical activity for a minimum of 30 minutes discussed and encouraged, as a part of hypertension management. The importance of attaining a healthy weight is also discussed.  BP/Weight 06/18/2016 03/27/2016 02/27/2016 12/24/2015 12/05/2015 07/18/2015 3/61/4431  Systolic BP 540 086 761 950 932 671 245  Diastolic BP 84 96 88 80 78 92 100  Wt. (Lbs) 220 211.8 208 205 206.08 - 208  BMI 30.68 29.54 29.01 28.59 28.74 - 29.02  Some encounter information is confidential and restricted. Go to Review Flowsheets activity to see all data.

## 2016-06-19 LAB — COMPREHENSIVE METABOLIC PANEL
ALT: 22 U/L (ref 9–46)
AST: 24 U/L (ref 10–40)
Albumin: 4.1 g/dL (ref 3.6–5.1)
Alkaline Phosphatase: 57 U/L (ref 40–115)
BUN: 13 mg/dL (ref 7–25)
CO2: 28 mmol/L (ref 20–31)
Calcium: 9.4 mg/dL (ref 8.6–10.3)
Chloride: 103 mmol/L (ref 98–110)
Creat: 1.35 mg/dL (ref 0.60–1.35)
Glucose, Bld: 83 mg/dL (ref 65–99)
Potassium: 4.5 mmol/L (ref 3.5–5.3)
Sodium: 138 mmol/L (ref 135–146)
Total Bilirubin: 0.8 mg/dL (ref 0.2–1.2)
Total Protein: 7.2 g/dL (ref 6.1–8.1)

## 2016-06-19 LAB — LIPID PANEL
Cholesterol: 175 mg/dL (ref <200)
HDL: 64 mg/dL (ref >40)
LDL Cholesterol: 97 mg/dL (ref <100)
Total CHOL/HDL Ratio: 2.7 Ratio (ref <5.0)
Triglycerides: 72 mg/dL (ref <150)
VLDL: 14 mg/dL (ref <30)

## 2016-06-19 LAB — CBC
HCT: 44.5 % (ref 38.5–50.0)
Hemoglobin: 15 g/dL (ref 13.2–17.1)
MCH: 30.1 pg (ref 27.0–33.0)
MCHC: 33.7 g/dL (ref 32.0–36.0)
MCV: 89.2 fL (ref 80.0–100.0)
MPV: 11.1 fL (ref 7.5–12.5)
Platelets: 217 10*3/uL (ref 140–400)
RBC: 4.99 MIL/uL (ref 4.20–5.80)
RDW: 13.6 % (ref 11.0–15.0)
WBC: 4 10*3/uL (ref 3.8–10.8)

## 2016-06-19 LAB — TSH: TSH: 1.38 mIU/L (ref 0.40–4.50)

## 2016-06-19 LAB — PSA: PSA: 1 ng/mL (ref <=4.0)

## 2016-06-20 ENCOUNTER — Encounter: Payer: Self-pay | Admitting: Family Medicine

## 2016-06-20 LAB — VITAMIN D 25 HYDROXY (VIT D DEFICIENCY, FRACTURES): Vit D, 25-Hydroxy: 53 ng/mL (ref 30–100)

## 2016-06-24 ENCOUNTER — Encounter: Payer: Self-pay | Admitting: Cardiology

## 2016-06-24 NOTE — Progress Notes (Signed)
Cardiology Office Note  Date: 06/25/2016   ID: Evan Jacobson, DOB [birth year], MRN [id number]  PCP: Fayrene Helper, MD  Consulting Cardiologist: Rozann Lesches, MD   Chief Complaint  Patient presents with  . Palpitations  . Chest Pain    History of Present Illness: Evan Jacobson is a 51 y.o. male referred for cardiology consultation by Dr. Moshe Cipro for evaluation of palpitations and chest pain. He states that he has had intermittent feelings of chest fluttering since May 1. There has been no obvious precipitant, usually this lasts for a few seconds to a minute or two, seems to be worse in the evenings. He has had no lightheadedness or syncope. Also describes a pressure-like upper chest discomfort sometimes follows the palpitations. Not specifically exertional in nature. He states that he has gained about 20 pounds, has not been exercising regularly since December of last year. Reports that he drinks alcohol regularly.  Cardiac risk factors include gender, history of hypertension. He is on Norvasc, also recently started on aspirin. No other medication changes.  Recent lipid panel is outlined below.  Past Medical History:  Diagnosis Date  . Allergic rhinitis   . Bipolar disorder ([place]) 08/25/2011  . Chronic back pain   . Concussion 2012  . GERD (gastroesophageal reflux disease)   . Hypertension   . Post-concussion syndrome 08/15/2010  . PTSD (post-traumatic stress disorder)     History reviewed. No pertinent surgical history.  Current Outpatient Prescriptions  Medication Sig Dispense Refill  . acetaminophen (TYLENOL) 500 MG tablet One tablet twice daily for chronic back pain 60 tablet 3  . amLODipine (NORVASC) 2.5 MG tablet Take 3 tablets (7.5 mg total) by mouth daily. 270 tablet 1  . aspirin 81 MG tablet Take 1 tablet (81 mg total) by mouth daily. 100 tablet 3  . cyclobenzaprine (FLEXERIL) 10 MG tablet Take 1 tablet (10 mg total) by mouth at bedtime. 30 tablet 3  .  fluticasone (FLONASE) 50 MCG/ACT nasal spray Place 2 sprays into both nostrils daily as needed for allergies or rhinitis.    Marland Kitchen HYDROcodone-acetaminophen (NORCO) 10-325 MG tablet One tablet twice daily 60 tablet 0  . KRILL OIL PO Take 1 capsule by mouth daily.    Marland Kitchen loratadine (CLARITIN) 10 MG tablet Take 1 tablet (10 mg total) by mouth daily. 30 tablet 11  . Multiple Vitamin (MULTIVITAMIN WITH MINERALS) TABS tablet Take 1 tablet by mouth daily.     No current facility-administered medications for this visit.    Allergies:  Ace inhibitors and Sulfonamide derivatives   Social History: The patient  reports that he has never smoked. He has never used smokeless tobacco. He reports that he drinks alcohol. He reports that he does not use drugs.   Family History: The patient's family history includes Heart disease in his mother; Hypertension in his brother, brother, father, mother, sister, and sister; Stroke in his father.   ROS:  Please see the history of present illness. Otherwise, complete review of systems is positive for chronic back pain.  All other systems are reviewed and negative.   Physical Exam: VS:  BP 130/78   Pulse 89   Ht 5\' 11"  (1.803 m)   Wt 218 lb (98.9 kg)   SpO2 94%   BMI 30.40 kg/m , BMI Body mass index is 30.4 kg/m.  Wt Readings from Last 3 Encounters:  06/25/16 218 lb (98.9 kg)  06/18/16 220 lb (99.8 kg)  03/27/16 211 lb 12.8 oz (96.1  kg)    General: Well-developed male, appears comfortable at rest. HEENT: Conjunctiva and lids normal, oropharynx clear. Neck: Supple, no elevated JVP or carotid bruits, no thyromegaly. Lungs: Clear to auscultation, nonlabored breathing at rest. Cardiac: Regular rate and rhythm, no S3 or significant systolic murmur, no pericardial rub. Abdomen: Soft, nontender, bowel sounds present, no guarding or rebound. Extremities: No pitting edema, distal pulses 2+. Skin: Warm and dry. Musculoskeletal: No kyphosis. Neuropsychiatric: Alert and  oriented x3, affect grossly appropriate.  ECG: I personally reviewed the tracing from 06/18/2016 which showed sinus rhythm with nonspecific ST/T-wave abnormalities.  Recent Labwork: 06/18/2016: ALT 22; AST 24; BUN 13; Creat 1.35; Hemoglobin 15.0; Platelets 217; Potassium 4.5; Sodium 138; TSH 1.38     Component Value Date/Time   CHOL 175 06/18/2016 0936   TRIG 72 06/18/2016 0936   HDL 64 06/18/2016 0936   CHOLHDL 2.7 06/18/2016 0936   VLDL 14 06/18/2016 0936   LDLCALC 97 06/18/2016 0936    Other Studies Reviewed Today:  Chest x-ray 03/12/2015: FINDINGS: Cardiomediastinal silhouette is stable. No acute infiltrate or pleural effusion. No pulmonary edema. Minimal degenerative changes lower thoracic spine.  IMPRESSION: No active cardiopulmonary disease.  Assessment and Plan:  1. Atypical chest pain in a 51 year old male with history of hypertension, abnormal but nonspecific ECG at baseline. Family history includes stroke in his father and heart disease in his mother. He has not undergone any prior ischemic testing and would like to get back to his regular exercise plan. We will obtain an exercise echocardiogram for ischemic assessment.  2. Palpitations as described above. States that this has been occurring on a daily basis. No lightheadedness or syncope. In addition to ischemic workup, we will place a 48 hour Holter monitor to assess for any potential arrhythmias.  3. Essential hypertension, on Norvasc with systolic blood pressure in the 130s today.  4. Recent lipid panel showing LDL 97 and HDL 64.  Current medicines were reviewed with the patient today.   Orders Placed This Encounter  Procedures  . Holter monitor - 48 hour  . ECHOCARDIOGRAM STRESS TEST    Disposition: Call with test results.  Signed, Satira Sark, MD, [facility] 06/25/2016 9:23 AM    [facility]. [address] [place], [place] [address] Phone: [phone]; Fax:  [phone]

## 2016-06-25 ENCOUNTER — Encounter: Payer: Self-pay | Admitting: Cardiology

## 2016-06-25 ENCOUNTER — Ambulatory Visit (INDEPENDENT_AMBULATORY_CARE_PROVIDER_SITE_OTHER): Payer: 59 | Admitting: Cardiology

## 2016-06-25 VITALS — BP 130/78 | HR 89 | Ht 71.0 in | Wt 218.0 lb

## 2016-06-25 DIAGNOSIS — R002 Palpitations: Secondary | ICD-10-CM | POA: Diagnosis not present

## 2016-06-25 DIAGNOSIS — R9431 Abnormal electrocardiogram [ECG] [EKG]: Secondary | ICD-10-CM

## 2016-06-25 DIAGNOSIS — I10 Essential (primary) hypertension: Secondary | ICD-10-CM | POA: Diagnosis not present

## 2016-06-25 DIAGNOSIS — R0789 Other chest pain: Secondary | ICD-10-CM | POA: Diagnosis not present

## 2016-06-25 NOTE — Patient Instructions (Signed)
Your physician recommends that you schedule a follow-up appointment in:  To be determined after tests, we will call you with results.   Your physician has requested that you have a stress echocardiogram. For further information please visit [email or web address]. Please follow instruction sheet as given.   Your physician has recommended that you wear a holter monitor for 48 hrs. Holter monitors are medical devices that record the heart's electrical activity. Doctors most often use these monitors to diagnose arrhythmias. Arrhythmias are problems with the speed or rhythm of the heartbeat. The monitor is a small, portable device. You can wear one while you do your normal daily activities. This is usually used to diagnose what is causing palpitations/syncope (passing out).     Your physician recommends that you continue on your current medications as directed. Please refer to the Current Medication list given to you today.      Thank you for choosing [facility] !

## 2016-06-26 ENCOUNTER — Telehealth: Payer: Self-pay | Admitting: Family Medicine

## 2016-06-26 NOTE — Telephone Encounter (Signed)
Patient left message on nurse line 4pm 06/25/16.  Asking for Brandi.  Was waiting to here back from you about his lab work.

## 2016-06-26 NOTE — Telephone Encounter (Signed)
Pt aware of results 

## 2016-07-01 ENCOUNTER — Ambulatory Visit (HOSPITAL_COMMUNITY)
Admission: RE | Admit: 2016-07-01 | Discharge: 2016-07-01 | Disposition: A | Discharge status: Home / Self Care | Payer: 59 | Source: Ambulatory Visit | Attending: Cardiology | Admitting: Cardiology

## 2016-07-01 DIAGNOSIS — R9431 Abnormal electrocardiogram [ECG] [EKG]: Secondary | ICD-10-CM | POA: Diagnosis not present

## 2016-07-01 DIAGNOSIS — I10 Essential (primary) hypertension: Secondary | ICD-10-CM | POA: Diagnosis not present

## 2016-07-01 DIAGNOSIS — R0789 Other chest pain: Secondary | ICD-10-CM | POA: Insufficient documentation

## 2016-07-01 DIAGNOSIS — R002 Palpitations: Secondary | ICD-10-CM | POA: Diagnosis not present

## 2016-07-01 DIAGNOSIS — R000 Tachycardia, unspecified: Secondary | ICD-10-CM | POA: Insufficient documentation

## 2016-07-01 LAB — ECHOCARDIOGRAM STRESS TEST
Estimated workload: 13.7 METS
Exercise duration (min): 10 min
Exercise duration (sec): 11 s
MPHR: 171 {beats}/min
Peak HR: 171 {beats}/min
Percent HR: 100 %
RPE: 13
Rest HR: 81 {beats}/min

## 2016-07-01 NOTE — Progress Notes (Signed)
*  PRELIMINARY RESULTS* Echocardiogram Echocardiogram Stress Test has been performed.  Leavy Cella 07/01/2016, 9:26 AM

## 2016-07-02 ENCOUNTER — Telehealth: Payer: Self-pay | Admitting: Cardiovascular Disease

## 2016-07-02 NOTE — Telephone Encounter (Signed)
Spoke with pt. He wanted to let us know that he had some episodes of heart fluttering and chest pressure while wearing monitor. He was wanting to see if we could look at it to see if it showed anything. I let him know that we will have to wait to get the monitor back from him tomorrow and upload the results for the doctor to review. He voiced understanding.

## 2016-07-02 NOTE — Telephone Encounter (Signed)
Patient would like return phone call regarding symptoms./tg

## 2016-07-03 ENCOUNTER — Ambulatory Visit: Payer: Self-pay | Admitting: Family Medicine

## 2016-07-03 ENCOUNTER — Emergency Department (HOSPITAL_COMMUNITY)
Admission: EM | Admit: 2016-07-03 | Discharge: 2016-07-03 | Disposition: A | Discharge status: Home / Self Care | Payer: 59 | Attending: Emergency Medicine | Admitting: Emergency Medicine

## 2016-07-03 ENCOUNTER — Encounter (HOSPITAL_COMMUNITY): Payer: Self-pay | Admitting: Emergency Medicine

## 2016-07-03 ENCOUNTER — Telehealth: Payer: Self-pay

## 2016-07-03 DIAGNOSIS — R002 Palpitations: Secondary | ICD-10-CM

## 2016-07-03 DIAGNOSIS — I10 Essential (primary) hypertension: Secondary | ICD-10-CM | POA: Insufficient documentation

## 2016-07-03 DIAGNOSIS — Z79899 Other long term (current) drug therapy: Secondary | ICD-10-CM | POA: Diagnosis not present

## 2016-07-03 LAB — BASIC METABOLIC PANEL
Anion gap: 7 (ref 5–15)
BUN: 18 mg/dL (ref 6–20)
CO2: 28 mmol/L (ref 22–32)
Calcium: 9.5 mg/dL (ref 8.9–10.3)
Chloride: 106 mmol/L (ref 101–111)
Creatinine, Ser: 1.15 mg/dL (ref 0.61–1.24)
GFR calc Af Amer: >60 mL/min (ref >60)
GFR calc non Af Amer: >60 mL/min (ref >60)
Glucose, Bld: 86 mg/dL (ref 65–99)
Potassium: 3.9 mmol/L (ref 3.5–5.1)
Sodium: 141 mmol/L (ref 135–145)

## 2016-07-03 LAB — CBC
HCT: 43.8 % (ref 39.0–52.0)
Hemoglobin: 15 g/dL (ref 13.0–17.0)
MCH: 30.2 pg (ref 26.0–34.0)
MCHC: 34.2 g/dL (ref 30.0–36.0)
MCV: 88.3 fL (ref 78.0–100.0)
Platelets: 208 10*3/uL (ref 150–400)
RBC: 4.96 MIL/uL (ref 4.22–5.81)
RDW: 12.5 % (ref 11.5–15.5)
WBC: 3.9 10*3/uL — ABNORMAL LOW (ref 4.0–10.5)

## 2016-07-03 NOTE — Discharge Instructions (Signed)
Dr. Domenic Polite is loooking at your monitor strips this afternoon and will call to let you know the results Your testing here is normal  ER for worsening symtpoms. Rest and drink plenty of fluids today

## 2016-07-03 NOTE — ED Provider Notes (Signed)
[facility] DEPT Provider Note   CSN: [id number] Arrival date & time: 07/03/16  5465     History   Chief Complaint Chief Complaint  Patient presents with  . Palpitations    HPI Evan Jacobson is a 51 y.o. male.  HPI  51 year old male with history of hypertension as well as bipolar and posterior stress disorder, recently started having some chest discomfort especially at night on the left side with an associated feeling of palpitations and has recently undergone a stress echocardiogram by the cardiologist and was placed on a cardiac monitor which she wore for the last 48 hours. On his way to return the cardiac monitor to the cardiologist this morning he felt like he was seen bright lights, feeling like he was lightheaded and feeling palpitations thus he presents to the emergency department with the symptoms. At this time the patient is symptom-free. He denies any difficulty breathing, swelling of the legs, cough, fever or any difficulty with gastrointestinal genitourinary symptoms. There is no back pain at this time, he is not feeling any chest discomfort and has no headaches. His visual symptoms have resolved and he has no blurred vision loss of vision or double vision. This was acute in onset, occurred approximately one hour ago between 907 and 9:09 AM.  Past Medical History:  Diagnosis Date  . Allergic rhinitis   . Bipolar disorder ([place]) 08/25/2011  . Chronic back pain   . Concussion 2012  . GERD (gastroesophageal reflux disease)   . Hypertension   . Post-concussion syndrome 08/15/2010  . PTSD (post-traumatic stress disorder)     Patient Active Problem List   Diagnosis Date Noted  . Chest pain at rest 06/18/2016  . Heart palpitations 06/18/2016  . Encounter for chronic pain management 02/27/2016  . Persistent adjustment disorder 12/05/2015  . Groin pain, chronic, right 06/28/2014  . Overweight (BMI 25.0-29.9) 05/20/2014  . Back pain with left-sided radiculopathy  01/16/2014  . Persistent testicular pain 12/13/2010  . Essential hypertension 06/19/2008  . ERECTILE DYSFUNCTION 11/07/2007  . Allergic rhinitis 08/13/2007  . GERD 08/13/2007    History reviewed. No pertinent surgical history.     Home Medications    Prior to Admission medications   Medication Sig Start Date End Date Taking? Authorizing Provider  acetaminophen (TYLENOL) 500 MG tablet One tablet twice daily for chronic back pain 12/24/15   Fayrene Helper, MD  amLODipine (NORVASC) 2.5 MG tablet Take 3 tablets (7.5 mg total) by mouth daily. 05/07/16   Fayrene Helper, MD  aspirin 81 MG tablet Take 1 tablet (81 mg total) by mouth daily. 06/18/16   Fayrene Helper, MD  cyclobenzaprine (FLEXERIL) 10 MG tablet Take 1 tablet (10 mg total) by mouth at bedtime. 12/24/15   Fayrene Helper, MD  fluticasone (FLONASE) 50 MCG/ACT nasal spray Place 2 sprays into both nostrils daily as needed for allergies or rhinitis.    [provider]  HYDROcodone-acetaminophen [facility]) 10-325 MG tablet One tablet twice daily 03/27/16   Fayrene Helper, MD  KRILL OIL PO Take 1 capsule by mouth daily.    [provider]  loratadine (CLARITIN) 10 MG tablet Take 1 tablet (10 mg total) by mouth daily. 05/19/14   Fayrene Helper, MD  Multiple Vitamin (MULTIVITAMIN WITH MINERALS) TABS tablet Take 1 tablet by mouth daily.    [provider]    Family History Family History  Problem Relation Age of Onset  . Stroke Father   . Hypertension Father   .  Hypertension Mother        [name]  . Heart disease Mother        Angina  . Hypertension Sister   . Hypertension Sister   . Hypertension Brother   . Hypertension Brother     Social History Social History  Substance Use Topics  . Smoking status: Never Smoker  . Smokeless tobacco: Never Used  . Alcohol use Yes     Comment: occ; six pack per week     Allergies   Ace inhibitors and Sulfonamide derivatives   Review of  Systems Review of Systems  All other systems reviewed and are negative.    Physical Exam Updated Vital Signs BP (!) 130/97   Pulse (!) 58   Temp 98.4 F (36.9 C) (Oral)   Resp 19   Ht 5\' 11"  (1.803 m)   Wt 98.9 kg (218 lb)   SpO2 98%   BMI 30.40 kg/m   Physical Exam  Constitutional: He appears well-developed and well-nourished. No distress.  HENT:  Head: Normocephalic and atraumatic.  Mouth/Throat: Oropharynx is clear and moist. No oropharyngeal exudate.  Eyes: Conjunctivae and EOM are normal. Pupils are equal, round, and reactive to light. Right eye exhibits no discharge. Left eye exhibits no discharge. No scleral icterus.  Neck: Normal range of motion. Neck supple. No JVD present. No thyromegaly present.  Cardiovascular: Normal rate, regular rhythm, normal heart sounds and intact distal pulses.  Exam reveals no gallop and no friction rub.   No murmur heard. No JVD, no peripheral edema, clear heart and lung sounds, there is no murmurs rubs or gallops, normal pulses at the radial arteries bilaterally, no ectopy auscultated  Pulmonary/Chest: Effort normal and breath sounds normal. No respiratory distress. He has no wheezes. He has no rales.  Abdominal: Soft. Bowel sounds are normal. He exhibits no distension and no mass. There is no tenderness.  Musculoskeletal: Normal range of motion. He exhibits no edema or tenderness.  Lymphadenopathy:    He has no cervical adenopathy.  Neurological: He is alert. Coordination normal.  Skin: Skin is warm and dry. No rash noted. No erythema.  Psychiatric: He has a normal mood and affect. His behavior is normal.  Nursing note and vitals reviewed.    ED Treatments / Results  Labs (all labs ordered are listed, but only abnormal results are displayed) Labs Reviewed  CBC - Abnormal; Notable for the following:       Result Value   WBC 3.9 (*)    All other components within normal limits  BASIC METABOLIC PANEL    EKG  EKG  Interpretation  Date/Time:  Thursday July 03 2016 09:42:12 EDT Ventricular Rate:  67 PR Interval:  124 QRS Duration: 88 QT Interval:  406 QTC Calculation: 429 R Axis:   37 Text Interpretation:  Normal sinus rhythm Nonspecific ST and T wave abnormality Abnormal ECG since last tracing no significant change Confirmed by Noemi Chapel [phone]) on 07/03/2016 10:20:44 AM       Radiology No results found.  Procedures Procedures (including critical care time)  Medications Ordered in ED Medications - No data to display   Initial Impression / Assessment and Plan / ED Course  I have reviewed the triage vital signs and the nursing notes.  Pertinent labs & imaging results that were available during my care of the patient were reviewed by me and considered in my medical decision making (see chart for details).    Has normal ECG - I have spoken  with Dr. Domenic Polite, results pending of his cardiac monitor, will investigate (10:36 AM)  Labs unremarkable  Nurse from office called and states they will call him this afternoon with results of monitoring.  States can go home I agree Pt informed Stable for d/c.  Final Clinical Impressions(s) / ED Diagnoses   Final diagnoses:  Palpitations    New Prescriptions New Prescriptions   No medications on file     Noemi Chapel, MD 07/03/16 1115

## 2016-07-03 NOTE — ED Triage Notes (Signed)
Pt reports driving to the hospital to return his holter monitor he had been wearing and began to feel light headed and began having palpitations.  Brought over from cardiac rehab for evaluation.

## 2016-07-03 NOTE — Telephone Encounter (Signed)
Patient called to say he was at the hospital this morning is why he missed his appointment. Per patient, he went for his heart monitor and got dizzy so they sent him to the ER. He stated he would call back to reschedule.

## 2016-07-04 NOTE — Telephone Encounter (Signed)
Noted- I see he was there

## 2016-07-15 ENCOUNTER — Telehealth: Payer: Self-pay

## 2016-07-15 NOTE — Telephone Encounter (Signed)
Request rx refill.

## 2016-07-15 NOTE — Telephone Encounter (Signed)
Needs pain medication, HYDROcodone-acetaminophen (NORCO) 10-325 MG tablet

## 2016-07-16 NOTE — Telephone Encounter (Signed)
This is only given during a pain management visit. Looks like he cancelled his recent office visit. Has to be rescheduled. No refills given until seen

## 2016-07-17 NOTE — Telephone Encounter (Signed)
Left msg with this information

## 2016-08-06 NOTE — Progress Notes (Signed)
Cardiology Office Note  Date: 08/07/2016   ID: Evan Jacobson, DOB [birth year], MRN [id number]  PCP: Fayrene Helper, MD  Primary Cardiologist: Rozann Lesches, MD   Chief Complaint  Patient presents with  . Palpitations    History of Present Illness: Evan Jacobson is a 51 y.o. male seen in consultation back in May. He presents for a scheduled follow-up visit. States that he has had fewer palpitations. He cut back alcohol as was recommended. Still has not gotten back to a regular exercise plan.  Recent exercise echocardiogram and Holter monitor results are outlined below. He had no evidence of ischemia with normal LVEF. Rare PVCs with one 3 beat run of NSVT and occasional PACs were noted by monitor. I discussed the results with him today.  Past Medical History:  Diagnosis Date  . Allergic rhinitis   . Bipolar disorder ([place]) 08/25/2011  . Chronic back pain   . Concussion 2012  . GERD (gastroesophageal reflux disease)   . Hypertension   . Post-concussion syndrome 08/15/2010  . PTSD (post-traumatic stress disorder)     History reviewed. No pertinent surgical history.  Current Outpatient Prescriptions  Medication Sig Dispense Refill  . acetaminophen (TYLENOL) 500 MG tablet One tablet twice daily for chronic back pain 60 tablet 3  . amLODipine (NORVASC) 2.5 MG tablet Take 3 tablets (7.5 mg total) by mouth daily. 270 tablet 1  . aspirin 81 MG tablet Take 1 tablet (81 mg total) by mouth daily. 100 tablet 3  . cyclobenzaprine (FLEXERIL) 10 MG tablet Take 1 tablet (10 mg total) by mouth at bedtime. 30 tablet 3  . fluticasone (FLONASE) 50 MCG/ACT nasal spray Place 2 sprays into both nostrils daily as needed for allergies or rhinitis.    Marland Kitchen HYDROcodone-acetaminophen (NORCO) 10-325 MG tablet One tablet twice daily 60 tablet 0  . KRILL OIL PO Take 1 capsule by mouth daily.    Marland Kitchen loratadine (CLARITIN) 10 MG tablet Take 1 tablet (10 mg total) by mouth daily. 30 tablet 11  .  Multiple Vitamin (MULTIVITAMIN WITH MINERALS) TABS tablet Take 1 tablet by mouth daily.     No current facility-administered medications for this visit.    Allergies:  Ace inhibitors and Sulfonamide derivatives   Social History: The patient  reports that he has never smoked. He has never used smokeless tobacco. He reports that he drinks alcohol. He reports that he does not use drugs.   ROS:  Please see the history of present illness. Otherwise, complete review of systems is positive for none.  All other systems are reviewed and negative.   Physical Exam: VS:  BP 124/82   Pulse 80   Ht 5\' 11"  (1.803 m)   Wt 215 lb (97.5 kg)   SpO2 97%   BMI 29.99 kg/m , BMI Body mass index is 29.99 kg/m.  Wt Readings from Last 3 Encounters:  08/07/16 215 lb (97.5 kg)  07/03/16 218 lb (98.9 kg)  06/25/16 218 lb (98.9 kg)    General: Well-developed male, appears comfortable at rest. HEENT: Conjunctiva and lids normal, oropharynx clear. Neck: Supple, no elevated JVP or carotid bruits, no thyromegaly. Lungs: Clear to auscultation, nonlabored breathing at rest. Cardiac: Regular rate and rhythm, no S3 or significant systolic murmur, no pericardial rub. Abdomen: Soft, nontender, bowel sounds present, no guarding or rebound. Extremities: No pitting edema, distal pulses 2+.  ECG: I personally reviewed the tracing from 07/03/2016 which showed normal sinus rhythm with nonspecific ST changes.  Recent Labwork: 06/18/2016: ALT 22; AST 24; TSH 1.38 07/03/2016: BUN 18; Creatinine, Ser 1.15; Hemoglobin 15.0; Platelets 208; Potassium 3.9; Sodium 141     Component Value Date/Time   CHOL 175 06/18/2016 0936   TRIG 72 06/18/2016 0936   HDL 64 06/18/2016 0936   CHOLHDL 2.7 06/18/2016 0936   VLDL 14 06/18/2016 0936   LDLCALC 97 06/18/2016 0936    Other Studies Reviewed Today:  Exercise echocardiogram 07/01/2016: Study Conclusions  - Stress ECG conclusions: The stress ECG was negative for ischemia.   [facility]  scoring: exercise time of 10 min; maximum ST deviation of 0   mm; no angina; resulting score is 10. This score predicts a low   risk of cardiac events. - Staged echo: There was no diagnostic evidence for stress-induced   ischemia.  Holter monitor 07/01/2016:  Predominantly sinus rhythm with rare PVC's and occasional PAC's.  One 3-beat run of NSVT correlated with symptoms of chest discomfort. Another episode of chest discomfort correlated with sinus rhythm.  Sinus tachycardia also noted, HR 148 bpm.  Assessment and Plan:  1. Palpitations, generally improved. He did not have any sustained arrhythmias by recent monitoring, but did have ectopy in the form of PVCs and PACs, one 3 beat run of NSVT. Ischemic evaluation was reassuring with normal exercise echocardiogram. I have recommended that he cut back regular alcohol intake and return to a regular exercise plan. No clear indication for additional medical therapy at this time. No further cardiac workup is planned at this time.  2. History of hypertension, continues on Norvasc. Keep follow-up with Dr. Moshe Cipro.  Current medicines were reviewed with the patient today.   Disposition: Follow-up as needed.  Signed, Satira Sark, MD, [facility] 08/07/2016 9:38 AM    [place] [facility] [address], [place], [place] [address] Phone: [phone]; Fax: [phone]

## 2016-08-07 ENCOUNTER — Encounter: Payer: Self-pay | Admitting: Cardiology

## 2016-08-07 ENCOUNTER — Ambulatory Visit (INDEPENDENT_AMBULATORY_CARE_PROVIDER_SITE_OTHER): Payer: 59 | Admitting: Cardiology

## 2016-08-07 VITALS — BP 124/82 | HR 80 | Ht 71.0 in | Wt 215.0 lb

## 2016-08-07 DIAGNOSIS — R002 Palpitations: Secondary | ICD-10-CM | POA: Diagnosis not present

## 2016-08-07 DIAGNOSIS — I10 Essential (primary) hypertension: Secondary | ICD-10-CM

## 2016-08-07 NOTE — Patient Instructions (Signed)
Your physician recommends that you schedule a follow-up appointment in: as needed     Your physician recommends that you continue on your current medications as directed. Please refer to the Current Medication list given to you today.      Np lab tests or blood work ordered.       Thank you for choosing [facility] !

## 2016-08-15 ENCOUNTER — Telehealth: Payer: Self-pay | Admitting: Family Medicine

## 2016-08-15 NOTE — Telephone Encounter (Signed)
Patient called, states he ran out of his pain medicine in June. States he knows he missed last appt with Dr Moshe Cipro but he states he was in the ED- he states that he was wearing a heart monitor and when he went to the [facility] to have it taken off, they sent him to the ED. He states he has another appt scheduled in August, but he is in a lot of pain. Requests that Brandi call him back.

## 2016-08-15 NOTE — Telephone Encounter (Signed)
Callback number for patient is [phone]

## 2016-08-15 NOTE — Telephone Encounter (Signed)
He missed his June appt for the pain med refill. Then there is a cancelled appt for July 31. We can place him on a wait list for an appt if someone cancels.

## 2016-08-19 ENCOUNTER — Ambulatory Visit (INDEPENDENT_AMBULATORY_CARE_PROVIDER_SITE_OTHER): Payer: 59 | Admitting: Family Medicine

## 2016-08-19 ENCOUNTER — Encounter: Payer: Self-pay | Admitting: Family Medicine

## 2016-08-19 VITALS — BP 120/80 | HR 100 | Temp 99.5°F | Resp 16 | Ht 71.0 in | Wt 221.8 lb

## 2016-08-19 DIAGNOSIS — M5410 Radiculopathy, site unspecified: Secondary | ICD-10-CM

## 2016-08-19 DIAGNOSIS — I10 Essential (primary) hypertension: Secondary | ICD-10-CM | POA: Diagnosis not present

## 2016-08-19 DIAGNOSIS — G8929 Other chronic pain: Secondary | ICD-10-CM

## 2016-08-19 MED ORDER — HYDROCODONE-ACETAMINOPHEN 10-325 MG PO TABS: ORAL_TABLET | ORAL | 0 refills | Status: DC

## 2016-08-19 MED ORDER — HYDROCODONE-ACETAMINOPHEN 10-325 MG PO TABS: 1.0000 | ORAL_TABLET | Freq: Two times a day (BID) | ORAL | 0 refills | Status: DC | PRN

## 2016-08-19 NOTE — Patient Instructions (Signed)
F/u last week in September or first week in October, call if you need me sooner.  I recommend you get updated MRI through the [facility]  Resume pain medication as before  Disxcontinue alcohol  Work on weight loss  Please ensure you keep all appointments and continue to work on being healthy  Please work on good  health habits so that your health will improve. 1. Commitment to daily physical activity for 30 to 60  minutes, if you are able to do this.  2. Commitment to wise food choices. Aim for half of your  food intake to be vegetable and fruit, one quarter starchy foods, and one quarter protein. Try to eat on a regular schedule  3 meals per day, snacking between meals should be limited to vegetables or fruits or small portions of nuts. 64 ounces of water per day is generally recommended, unless you have specific health conditions, like heart failure or kidney failure where you will need to limit fluid intake.  3. Commitment to sufficient and a  good quality of physical and mental rest daily, generally between 6 to 8 hours per day.  WITH PERSISTANCE AND PERSEVERANCE, THE IMPOSSIBLE , BECOMES THE NORM! It is important that you exercise regularly at least 30 minutes 5 times a week. If you develop chest pain, have severe difficulty breathing, or feel very tired, stop exercising immediately and seek medical attention

## 2016-08-26 ENCOUNTER — Ambulatory Visit: Payer: Self-pay | Admitting: Family Medicine

## 2016-08-30 NOTE — Assessment & Plan Note (Signed)
The patient's Controlled Substance registry is reviewed and compliance confirmed. Adequacy of  Pain control and level of function is assessed. Medication dosing is adjusted as deemed appropriate. Twelve weeks of medication is prescribed , patient signs for the script and is provided with a follow up appointment between 11 to 12 weeks .  

## 2016-08-30 NOTE — Progress Notes (Signed)
   Evan Jacobson     MRN: [id number]      DOB: October 27, 1965   HPI Evan Jacobson is here for follow up and re-evaluation of chronic pain management, which he states has increased back and lower extremity pain Reports inc EtOH in recent times , reports because he had no pain medication. On appt day a in ED with palpitations and he has had cardiology evaluation and clearance in this regard  ROS Denies recent fever or chills. Denies sinus pressure, nasal congestion, ear pain or sore throat. Denies chest congestion, productive cough or wheezing. Denies chest pains, palpitations and leg swelling Denies abdominal pain, nausea, vomiting,diarrhea or constipation.    Denies uncontrolled depression, anxiety or insomnia.Is treated through the [facility] Denies skin break down or rash.   PE  BP 120/80 (BP Location: Left Arm, Patient Position: Sitting, Cuff Size: Normal)   Pulse 100   Temp 99.5 F (37.5 C) (Other (Comment))   Resp 16   Ht 5\' 11"  (1.803 m)   Wt 221 lb 12 oz (100.6 kg)   SpO2 97%   BMI 30.93 kg/m   Patient alert and oriented and in no cardiopulmonary distress.  HEENT: No facial asymmetry, EOMI,   oropharynx pink and moist.  Neck supple no JVD, no mass.  Chest: Clear to auscultation bilaterally.  CVS: S1, S2 no murmurs, no S3.Regular rate.  ABD: Soft non tender.   Ext: No edema  MS: Adequate though reduced ROM spine,normal in  shoulders, hips and knees.  Skin: Intact, no ulcerations or rash noted.  Psych: Good eye contact, normal affect. Memory intact not anxious or depressed appearing.  CNS: CN 2-12 intact, power,  normal throughout.no focal deficits noted.   Assessment & Plan  Encounter for chronic pain management The patient's Controlled Substance registry is reviewed and compliance confirmed. Adequacy of  Pain control and level of function is assessed. Medication dosing is adjusted as deemed appropriate. Twelve weeks of medication is prescribed , patient signs for  the script and is provided with a follow up appointment between 11 to 12 weeks .   Essential hypertension Controlled, no change in medication DASH diet and commitment to daily physical activity for a minimum of 30 minutes discussed and encouraged, as a part of hypertension management. The importance of attaining a healthy weight is also discussed.  BP/Weight 08/19/2016 08/07/2016 07/03/2016 06/25/2016 06/18/2016 03/27/2016 6/57/9038  Systolic BP 333 832 919 166 060 045 997  Diastolic BP 80 82 93 78 84 96 88  Wt. (Lbs) 221.75 215 218 218 220 211.8 208  BMI 30.93 29.99 30.4 30.4 30.68 29.54 29.01  Some encounter information is confidential and restricted. Go to Review Flowsheets activity to see all data.       Back pain with left-sided radiculopathy Reports increased pain and increased alcohol use, needs to lose weight, get dependence counseling and also recommend updated MRI through the [facility]

## 2016-08-30 NOTE — Assessment & Plan Note (Signed)
Controlled, no change in medication DASH diet and commitment to daily physical activity for a minimum of 30 minutes discussed and encouraged, as a part of hypertension management. The importance of attaining a healthy weight is also discussed.  BP/Weight 08/19/2016 08/07/2016 07/03/2016 06/25/2016 06/18/2016 03/27/2016 0/30/1499  Systolic BP 692 493 241 991 444 584 835  Diastolic BP 80 82 93 78 84 96 88  Wt. (Lbs) 221.75 215 218 218 220 211.8 208  BMI 30.93 29.99 30.4 30.4 30.68 29.54 29.01  Some encounter information is confidential and restricted. Go to Review Flowsheets activity to see all data.

## 2016-08-30 NOTE — Assessment & Plan Note (Signed)
Reports increased pain and increased alcohol use, needs to lose weight, get dependence counseling and also recommend updated MRI through the [facility]

## 2016-09-01 ENCOUNTER — Ambulatory Visit: Payer: Self-pay | Admitting: Family Medicine

## 2016-09-17 ENCOUNTER — Encounter: Payer: Self-pay | Admitting: Family Medicine

## 2016-09-17 ENCOUNTER — Ambulatory Visit (INDEPENDENT_AMBULATORY_CARE_PROVIDER_SITE_OTHER): Payer: 59 | Admitting: Family Medicine

## 2016-09-17 VITALS — BP 128/82 | HR 89 | Temp 99.0°F | Resp 16 | Ht 71.0 in | Wt 224.5 lb

## 2016-09-17 DIAGNOSIS — E663 Overweight: Secondary | ICD-10-CM | POA: Diagnosis not present

## 2016-09-17 DIAGNOSIS — I10 Essential (primary) hypertension: Secondary | ICD-10-CM

## 2016-09-17 DIAGNOSIS — J301 Allergic rhinitis due to pollen: Secondary | ICD-10-CM | POA: Diagnosis not present

## 2016-09-17 DIAGNOSIS — R221 Localized swelling, mass and lump, neck: Secondary | ICD-10-CM | POA: Insufficient documentation

## 2016-09-17 MED ORDER — METHYLPREDNISOLONE ACETATE 80 MG/ML IJ SUSP
80.0000 mg | Freq: Once | INTRAMUSCULAR | Status: AC
  Administered 2016-09-17: 80 mg via INTRAMUSCULAR

## 2016-09-17 MED ORDER — PREDNISONE 5 MG (21) PO TBPK: 5.0000 mg | ORAL_TABLET | ORAL | 0 refills | Status: DC

## 2016-09-17 NOTE — Assessment & Plan Note (Addendum)
,   depo medrol 80 mg IM in office today as uncontrolled, pt to wear a mask when mowing grass

## 2016-09-17 NOTE — Patient Instructions (Signed)
F/u as before, call if you need me before  Depo medrol in office today for allergies and prednisone sent in  You are referred for Ultrasound of your neck, we will call with appointment information

## 2016-09-17 NOTE — Assessment & Plan Note (Addendum)
1 week h/o painful selling of right side of neck, ultrasound of mass to evaluate

## 2016-09-18 ENCOUNTER — Telehealth: Payer: Self-pay | Admitting: Family Medicine

## 2016-09-18 DIAGNOSIS — I10 Essential (primary) hypertension: Secondary | ICD-10-CM

## 2016-09-18 LAB — CBC
HCT: 43.7 % (ref 38.5–50.0)
Hemoglobin: 14.6 g/dL (ref 13.2–17.1)
MCH: 29.9 pg (ref 27.0–33.0)
MCHC: 33.4 g/dL (ref 32.0–36.0)
MCV: 89.4 fL (ref 80.0–100.0)
MPV: 11.3 fL (ref 7.5–12.5)
Platelets: 224 10*3/uL (ref 140–400)
RBC: 4.89 MIL/uL (ref 4.20–5.80)
RDW: 13.1 % (ref 11.0–15.0)
WBC: 7.2 10*3/uL (ref 3.8–10.8)

## 2016-09-19 ENCOUNTER — Ambulatory Visit (HOSPITAL_COMMUNITY)
Admission: RE | Admit: 2016-09-19 | Discharge: 2016-09-19 | Disposition: A | Discharge status: Home / Self Care | Payer: 59 | Source: Ambulatory Visit | Attending: Family Medicine | Admitting: Family Medicine

## 2016-09-19 DIAGNOSIS — R221 Localized swelling, mass and lump, neck: Secondary | ICD-10-CM | POA: Diagnosis present

## 2016-09-19 LAB — TSH: TSH: 0.5 mIU/L (ref 0.40–4.50)

## 2016-09-19 NOTE — Assessment & Plan Note (Signed)
Controlled, no change in medication DASH diet and commitment to daily physical activity for a minimum of 30 minutes discussed and encouraged, as a part of hypertension management. The importance of attaining a healthy weight is also discussed.  BP/Weight 09/17/2016 08/19/2016 08/07/2016 07/03/2016 06/25/2016 7/54/3606 08/03/338  Systolic BP 352 481 859 093 112 162 446  Diastolic BP 82 80 82 93 78 84 96  Wt. (Lbs) 224.5 221.75 215 218 218 220 211.8  BMI 31.31 30.93 29.99 30.4 30.4 30.68 29.54  Some encounter information is confidential and restricted. Go to Review Flowsheets activity to see all data.

## 2016-09-19 NOTE — Progress Notes (Signed)
Evan Jacobson     MRN: [id number]      DOB: Oct 24, 1965   HPI [name] is here with a 1 week h/o painful swelling on the right side of his neck. He denies trauma to the area. This is new. He denies pain with movement of the neck He is very anxious and wants to know what it is. He denies fever or chills. He denies dental pain He c/o increased clear watery nasal drainage, sneezing and sinus drainge this occurs after mowing his grass without using a mask which he recently dis, requests shot for excess allergy symptoms and understands the need to use a mask  ROS See HPI  Denies recent fever or chills. Denies sinus pressure, nasal congestion, ear pain or sore throat. Denies chest congestion, productive cough or wheezing. Denies chest pains, palpitations and leg swelling Denies abdominal pain, nausea, vomiting,diarrhea or constipation.   Denies dysuria, frequency, hesitancy or incontinence. c/o chronic back pain, states that with PTSD he may be treated for this through the [place]. Denies headaches, seizures, Denies skin break down or rash.   PE  BP 128/82 (BP Location: Right Arm, Patient Position: Sitting, Cuff Size: Normal)   Pulse 89   Temp 99 F (37.2 C) (Other (Comment))   Resp 16   Ht 5\' 11"  (1.803 m)   Wt 224 lb 8 oz (101.8 kg)   SpO2 98%   BMI 31.31 kg/m   Patient alert and oriented and in no cardiopulmonary distress.  HEENT: No facial asymmetry, EOMI,   oropharynx pink and moist.  Neck supple no JVD, tender right neck mass approximately 2 in in diameter Erythema and edema of nasal mucosa, conjunctival injection Chest: Clear to auscultation bilaterally.  CVS: S1, S2 no murmurs, no S3.Regular rate.  ABD: Soft non tender.   Ext: No edema  MS: decreased  ROM lumbar spine, normal in  shoulders, hips and knees.  Skin: Intact, no ulcerations or rash noted.  Psych: Good eye contact, normal affect. Memory intact not anxious or depressed appearing.  CNS: CN 2-12  intact, power,  normal throughout.no focal deficits noted.   Assessment & Plan  Mass of right side of neck 1 week h/o painful selling of right side of neck, ultrasound of mass to evaluate  Seasonal allergies , depo medrol 80 mg IM in office today as uncontrolled, pt to wear a mask when mowing grass  Essential hypertension Controlled, no change in medication DASH diet and commitment to daily physical activity for a minimum of 30 minutes discussed and encouraged, as a part of hypertension management. The importance of attaining a healthy weight is also discussed.  BP/Weight 09/17/2016 08/19/2016 08/07/2016 07/03/2016 06/25/2016 03/27/6008 09/29/2353  Systolic BP 732 202 542 706 237 628 315  Diastolic BP 82 80 82 93 78 84 96  Wt. (Lbs) 224.5 221.75 215 218 218 220 211.8  BMI 31.31 30.93 29.99 30.4 30.4 30.68 29.54  Some encounter information is confidential and restricted. Go to Review Flowsheets activity to see all data.       Overweight (BMI 25.0-29.9) Deteriorated. Patient re-educated about  the importance of commitment to a  minimum of 150 minutes of exercise per week.  The importance of healthy food choices with portion control discussed. Encouraged to start a food diary, count calories and to consider  joining a support group. Sample diet sheets offered. Goals set by the patient for the next several months.   Weight /BMI 09/17/2016 08/19/2016 08/07/2016  WEIGHT 224  lb 8 oz 221 lb 12 oz 215 lb  HEIGHT 5\' 11"  5\' 11"  5\' 11"   BMI 31.31 kg/m2 30.93 kg/m2 29.99 kg/m2  Some encounter information is confidential and restricted. Go to Review Flowsheets activity to see all data.

## 2016-09-19 NOTE — Assessment & Plan Note (Signed)
Deteriorated. Patient re-educated about  the importance of commitment to a  minimum of 150 minutes of exercise per week.  The importance of healthy food choices with portion control discussed. Encouraged to start a food diary, count calories and to consider  joining a support group. Sample diet sheets offered. Goals set by the patient for the next several months.   Weight /BMI 09/17/2016 08/19/2016 08/07/2016  WEIGHT 224 lb 8 oz 221 lb 12 oz 215 lb  HEIGHT 5\' 11"  5\' 11"  5\' 11"   BMI 31.31 kg/m2 30.93 kg/m2 29.99 kg/m2  Some encounter information is confidential and restricted. Go to Review Flowsheets activity to see all data.

## 2016-09-22 ENCOUNTER — Ambulatory Visit (HOSPITAL_COMMUNITY): Payer: 59

## 2016-09-22 ENCOUNTER — Encounter: Payer: Self-pay | Admitting: Family Medicine

## 2016-09-22 ENCOUNTER — Other Ambulatory Visit: Payer: Self-pay | Admitting: Family Medicine

## 2016-09-22 DIAGNOSIS — R9431 Abnormal electrocardiogram [ECG] [EKG]: Secondary | ICD-10-CM | POA: Diagnosis not present

## 2016-09-22 MED ORDER — MELOXICAM 15 MG PO TABS: 15.0000 mg | ORAL_TABLET | Freq: Every day | ORAL | 0 refills | Status: DC

## 2016-09-22 MED ORDER — LEVOFLOXACIN 500 MG PO TABS: 500.0000 mg | ORAL_TABLET | Freq: Every day | ORAL | 0 refills | Status: DC

## 2016-09-23 ENCOUNTER — Telehealth: Payer: Self-pay | Admitting: *Deleted

## 2016-09-23 NOTE — Telephone Encounter (Signed)
Patient aware to take med as directed. Came by the office

## 2016-09-23 NOTE — Telephone Encounter (Signed)
Patient called requesting to speak with Velna Hatchet regarding an urgent matter with his antibiotic. Patient states the bottle has different directions than what Dr Moshe Cipro told him to take. Please call patient back.

## 2016-09-24 ENCOUNTER — Ambulatory Visit (HOSPITAL_COMMUNITY): Payer: 59

## 2016-09-26 DIAGNOSIS — R000 Tachycardia, unspecified: Secondary | ICD-10-CM | POA: Diagnosis not present

## 2016-09-26 DIAGNOSIS — I493 Ventricular premature depolarization: Secondary | ICD-10-CM | POA: Diagnosis not present

## 2016-09-26 DIAGNOSIS — I491 Atrial premature depolarization: Secondary | ICD-10-CM | POA: Diagnosis not present

## 2016-11-03 ENCOUNTER — Ambulatory Visit: Payer: Self-pay | Admitting: Family Medicine

## 2016-11-13 DIAGNOSIS — M940 Chondrocostal junction syndrome [Tietze]: Secondary | ICD-10-CM | POA: Diagnosis not present

## 2016-11-24 ENCOUNTER — Telehealth: Payer: Self-pay | Admitting: Family Medicine

## 2016-11-24 NOTE — Telephone Encounter (Signed)
Patient is requesting lab work for 11/26/16 appt. Says something bit him about 2 weeks ago, he has finished the antibiotic and the bite is worse. Cb#: [phone]

## 2016-11-24 NOTE — Telephone Encounter (Signed)
pls order CBC and diff, chem 7 and ESR, fasting

## 2016-11-26 ENCOUNTER — Encounter: Payer: Self-pay | Admitting: Family Medicine

## 2016-11-26 ENCOUNTER — Ambulatory Visit (INDEPENDENT_AMBULATORY_CARE_PROVIDER_SITE_OTHER): Payer: 59 | Admitting: Family Medicine

## 2016-11-26 VITALS — BP 118/84 | HR 82 | Resp 16 | Ht 71.0 in | Wt 222.0 lb

## 2016-11-26 DIAGNOSIS — R21 Rash and other nonspecific skin eruption: Secondary | ICD-10-CM | POA: Diagnosis not present

## 2016-11-26 DIAGNOSIS — I10 Essential (primary) hypertension: Secondary | ICD-10-CM

## 2016-11-26 DIAGNOSIS — G8929 Other chronic pain: Secondary | ICD-10-CM

## 2016-11-26 DIAGNOSIS — T148XXA Other injury of unspecified body region, initial encounter: Secondary | ICD-10-CM

## 2016-11-26 DIAGNOSIS — E663 Overweight: Secondary | ICD-10-CM

## 2016-11-26 LAB — CBC WITH DIFFERENTIAL/PLATELET
Basophils Absolute: 41 cells/uL (ref 0–200)
Basophils Relative: 0.7 %
Eosinophils Absolute: 139 cells/uL (ref 15–500)
Eosinophils Relative: 2.4 %
HCT: 46.1 % (ref 38.5–50.0)
Hemoglobin: 15.7 g/dL (ref 13.2–17.1)
Lymphs Abs: 2465 cells/uL (ref 850–3900)
MCH: 29.8 pg (ref 27.0–33.0)
MCHC: 34.1 g/dL (ref 32.0–36.0)
MCV: 87.5 fL (ref 80.0–100.0)
MPV: 11.3 fL (ref 7.5–12.5)
Monocytes Relative: 9 %
Neutro Abs: 2633 cells/uL (ref 1500–7800)
Neutrophils Relative %: 45.4 %
Platelets: 196 10*3/uL (ref 140–400)
RBC: 5.27 10*6/uL (ref 4.20–5.80)
RDW: 12.7 % (ref 11.0–15.0)
Total Lymphocyte: 42.5 %
WBC mixed population: 522 cells/uL (ref 200–950)
WBC: 5.8 10*3/uL (ref 3.8–10.8)

## 2016-11-26 LAB — COMPREHENSIVE METABOLIC PANEL
AG Ratio: 1.3 (calc) (ref 1.0–2.5)
ALT: 23 U/L (ref 9–46)
AST: 18 U/L (ref 10–40)
Albumin: 4.2 g/dL (ref 3.6–5.1)
Alkaline phosphatase (APISO): 62 U/L (ref 40–115)
BUN: 18 mg/dL (ref 7–25)
CO2: 28 mmol/L (ref 20–32)
Calcium: 9.2 mg/dL (ref 8.6–10.3)
Chloride: 104 mmol/L (ref 98–110)
Creat: 1.33 mg/dL (ref 0.60–1.35)
Globulin: 3.3 g/dL (calc) (ref 1.9–3.7)
Glucose, Bld: 82 mg/dL (ref 65–99)
Potassium: 4.5 mmol/L (ref 3.5–5.3)
Sodium: 139 mmol/L (ref 135–146)
Total Bilirubin: 0.5 mg/dL (ref 0.2–1.2)
Total Protein: 7.5 g/dL (ref 6.1–8.1)

## 2016-11-26 LAB — SEDIMENTATION RATE: Sed Rate: 6 mm/h (ref 0–15)

## 2016-11-26 MED ORDER — HYDROCODONE-ACETAMINOPHEN 10-325 MG PO TABS: ORAL_TABLET | ORAL | 0 refills | Status: DC

## 2016-11-26 MED ORDER — HYDROCODONE-ACETAMINOPHEN 10-325 MG PO TABS: 1.0000 | ORAL_TABLET | Freq: Two times a day (BID) | ORAL | 0 refills | Status: DC | PRN

## 2016-11-26 MED ORDER — BETAMETHASONE DIPROPIONATE 0.05 % EX CREA: TOPICAL_CREAM | Freq: Two times a day (BID) | CUTANEOUS | 0 refills | Status: DC

## 2016-11-26 NOTE — Telephone Encounter (Signed)
Labs ordered today
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
## 2016-11-26 NOTE — Patient Instructions (Signed)
Annual physical exam in 11 weeks, call if  You need me sooner  Labs today cBC and diff , cmp , eSR  Cream to be applied to rash on leg is prescribed

## 2016-11-27 ENCOUNTER — Encounter: Payer: Self-pay | Admitting: Family Medicine

## 2016-12-01 ENCOUNTER — Encounter: Payer: Self-pay | Admitting: Family Medicine

## 2016-12-01 DIAGNOSIS — R21 Rash and other nonspecific skin eruption: Secondary | ICD-10-CM | POA: Insufficient documentation

## 2016-12-01 NOTE — Assessment & Plan Note (Signed)
The patient's Controlled Substance registry is reviewed and compliance confirmed. Adequacy of  Pain control and level of function is assessed. Medication dosing is adjusted as deemed appropriate. Twelve weeks of medication is prescribed , patient signs for the script and is provided with a follow up appointment between 11 to 12 weeks .  

## 2016-12-01 NOTE — Assessment & Plan Note (Signed)
Sparing use of topical steroid twice daily as needed for 5 to 7 days , then as needed, no sign of bacterial or fungal infection, recently treated through UC with oral antibiotics Baseline labs ordered based on patient's concerns

## 2016-12-01 NOTE — Assessment & Plan Note (Signed)
Unchanged. Patient re-educated about  the importance of commitment to a  minimum of 150 minutes of exercise per week.  The importance of healthy food choices with portion control discussed. Encouraged to start a food diary, count calories and to consider  joining a support group. Sample diet sheets offered. Goals set by the patient for the next several months.   Weight /BMI 11/26/2016 09/17/2016 08/19/2016  WEIGHT 222 lb 224 lb 8 oz 221 lb 12 oz  HEIGHT 5\' 11"  5\' 11"  5\' 11"   BMI 30.96 kg/m2 31.31 kg/m2 30.93 kg/m2  Some encounter information is confidential and restricted. Go to Review Flowsheets activity to see all data.

## 2016-12-01 NOTE — Assessment & Plan Note (Signed)
Controlled, no change in medication DASH diet and commitment to daily physical activity for a minimum of 30 minutes discussed and encouraged, as a part of hypertension management. The importance of attaining a healthy weight is also discussed.  BP/Weight 11/26/2016 09/17/2016 08/19/2016 08/07/2016 07/03/2016 06/25/2016 2/89/7915  Systolic BP 041 364 383 779 396 886 484  Diastolic BP 84 82 80 82 93 78 84  Wt. (Lbs) 222 224.5 221.75 215 218 218 220  BMI 30.96 31.31 30.93 29.99 30.4 30.4 30.68  Some encounter information is confidential and restricted. Go to Review Flowsheets activity to see all data.

## 2016-12-31 ENCOUNTER — Emergency Department (HOSPITAL_COMMUNITY)
Admission: EM | Admit: 2016-12-31 | Discharge: 2016-12-31 | Disposition: A | Discharge status: Home / Self Care | Payer: Non-veteran care | Attending: Emergency Medicine | Admitting: Emergency Medicine

## 2016-12-31 ENCOUNTER — Other Ambulatory Visit: Payer: Self-pay

## 2016-12-31 ENCOUNTER — Encounter (HOSPITAL_COMMUNITY): Payer: Self-pay | Admitting: Emergency Medicine

## 2016-12-31 ENCOUNTER — Telehealth: Payer: Self-pay | Admitting: Family Medicine

## 2016-12-31 DIAGNOSIS — Z7982 Long term (current) use of aspirin: Secondary | ICD-10-CM | POA: Insufficient documentation

## 2016-12-31 DIAGNOSIS — I10 Essential (primary) hypertension: Secondary | ICD-10-CM | POA: Diagnosis not present

## 2016-12-31 DIAGNOSIS — R51 Headache: Secondary | ICD-10-CM | POA: Diagnosis not present

## 2016-12-31 DIAGNOSIS — R42 Dizziness and giddiness: Secondary | ICD-10-CM | POA: Diagnosis present

## 2016-12-31 LAB — CBC WITH DIFFERENTIAL/PLATELET
Basophils Absolute: 0 10*3/uL (ref 0.0–0.1)
Basophils Relative: 1 %
Eosinophils Absolute: 0.4 10*3/uL (ref 0.0–0.7)
Eosinophils Relative: 8 %
HCT: 44.5 % (ref 39.0–52.0)
Hemoglobin: 14.7 g/dL (ref 13.0–17.0)
Lymphocytes Relative: 41 %
Lymphs Abs: 2.3 10*3/uL (ref 0.7–4.0)
MCH: 29.8 pg (ref 26.0–34.0)
MCHC: 33 g/dL (ref 30.0–36.0)
MCV: 90.3 fL (ref 78.0–100.0)
Monocytes Absolute: 0.3 10*3/uL (ref 0.1–1.0)
Monocytes Relative: 6 %
Neutro Abs: 2.5 10*3/uL (ref 1.7–7.7)
Neutrophils Relative %: 44 %
Platelets: 228 10*3/uL (ref 150–400)
RBC: 4.93 MIL/uL (ref 4.22–5.81)
RDW: 12.5 % (ref 11.5–15.5)
WBC: 5.5 10*3/uL (ref 4.0–10.5)

## 2016-12-31 LAB — TROPONIN I: Troponin I: <0.03 ng/mL (ref <0.03)

## 2016-12-31 LAB — BASIC METABOLIC PANEL
Anion gap: 7 (ref 5–15)
BUN: 14 mg/dL (ref 6–20)
CO2: 27 mmol/L (ref 22–32)
Calcium: 9.1 mg/dL (ref 8.9–10.3)
Chloride: 104 mmol/L (ref 101–111)
Creatinine, Ser: 1.15 mg/dL (ref 0.61–1.24)
GFR calc Af Amer: >60 mL/min (ref >60)
GFR calc non Af Amer: >60 mL/min (ref >60)
Glucose, Bld: 91 mg/dL (ref 65–99)
Potassium: 3.8 mmol/L (ref 3.5–5.1)
Sodium: 138 mmol/L (ref 135–145)

## 2016-12-31 NOTE — ED Triage Notes (Signed)
Pt c/o dizziness intermittent since having dental work last week.

## 2016-12-31 NOTE — Telephone Encounter (Signed)
I have no oBJECTIVE evidence, last  MRI spine was in 2018

## 2016-12-31 NOTE — Telephone Encounter (Signed)
[facility] Dr. Moshe Cipro  that states his back is the same, better or worse.  Please let the patient know when he can pick up  [phone]
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
## 2017-01-01 ENCOUNTER — Telehealth: Payer: Self-pay | Admitting: Family Medicine

## 2017-01-01 NOTE — Telephone Encounter (Signed)
Patient left message on nurse line for Brandi. He states that he spent most of yesterday afternoon in the emergency room with complaints of dizziness and lightheadedness. He states he told the doctor in the emergency room that his right ear was hurting, but it was never checked. He is asking if Dr. Moshe Cipro will call in an antibiotic for a possible ear infection. He is requesting something today if possible.   Callback# [phone]

## 2017-01-02 ENCOUNTER — Encounter: Payer: Self-pay | Admitting: Family Medicine

## 2017-01-02 ENCOUNTER — Telehealth: Payer: Self-pay | Admitting: Family Medicine

## 2017-01-02 NOTE — Telephone Encounter (Signed)
Patient is requesting a letter stating that his back is the same since he didn't have any evidence that is worse (requesting to talk to [place] about this) I read him the last note from [place], and let him know she had no evidence because he didn't complete the last ordered MRI. Also, patient states his light headedness, dizziness, and motion sickness all started after his dental bridge was placed last week. Cb#: [phone]

## 2017-01-02 NOTE — Telephone Encounter (Signed)
States while in ER for dizziness he complained of right ear pain but it was never checked. Requesting something be sent in. (ER also recommended follow up with PCP since no cause was found for the dizziness)

## 2017-01-02 NOTE — Telephone Encounter (Signed)
Pt aware.

## 2017-01-02 NOTE — Telephone Encounter (Signed)
pls explain there is no prescription medication for ear pain, medication for ear pain is now [employer], he can have the pharmacist direct him to this

## 2017-01-02 NOTE — Telephone Encounter (Signed)
This will be addressed at 12/13 appt

## 2017-01-02 NOTE — Telephone Encounter (Signed)
Patient coming in for er follow up and this will be addressed then

## 2017-01-06 ENCOUNTER — Telehealth: Payer: Self-pay | Admitting: Family Medicine

## 2017-01-06 NOTE — Telephone Encounter (Signed)
Letter typed to be signed

## 2017-01-06 NOTE — Telephone Encounter (Signed)
Please write a letter on this patient's behalf stating that he has suffered from chronic back pain since 2013, and that his medical condition is unchanged since that time.  I wll sign, thanks

## 2017-01-08 ENCOUNTER — Ambulatory Visit (INDEPENDENT_AMBULATORY_CARE_PROVIDER_SITE_OTHER): Payer: 59 | Admitting: Family Medicine

## 2017-01-08 ENCOUNTER — Encounter: Payer: Self-pay | Admitting: Family Medicine

## 2017-01-08 VITALS — BP 130/92 | HR 89 | Resp 16 | Ht 71.0 in | Wt 230.0 lb

## 2017-01-08 DIAGNOSIS — R42 Dizziness and giddiness: Secondary | ICD-10-CM

## 2017-01-08 DIAGNOSIS — M26609 Unspecified temporomandibular joint disorder, unspecified side: Secondary | ICD-10-CM | POA: Insufficient documentation

## 2017-01-08 DIAGNOSIS — I10 Essential (primary) hypertension: Secondary | ICD-10-CM | POA: Diagnosis not present

## 2017-01-08 DIAGNOSIS — E663 Overweight: Secondary | ICD-10-CM

## 2017-01-08 DIAGNOSIS — Z09 Encounter for follow-up examination after completed treatment for conditions other than malignant neoplasm: Secondary | ICD-10-CM

## 2017-01-08 DIAGNOSIS — M5410 Radiculopathy, site unspecified: Secondary | ICD-10-CM | POA: Diagnosis not present

## 2017-01-08 HISTORY — DX: Dizziness and giddiness: R42

## 2017-01-08 MED ORDER — MECLIZINE HCL 25 MG PO TABS: 25.0000 mg | ORAL_TABLET | Freq: Three times a day (TID) | ORAL | 0 refills | Status: DC | PRN

## 2017-01-08 NOTE — Patient Instructions (Addendum)
F/u as before, call if you need me sooner  You are being treated for right TMJ , take ibuprofen 800 mg one 3 times daily, for 5 to 7 days, use cold compresses to right jaw and do not chew excessively  Meclizine is prescribed for vertigo and you are referred to Dr Benjamine Mola in ENT as well. Do not drive until you no longer feel light headed  Temporomandibular Joint Syndrome Temporomandibular joint (TMJ) syndrome is a condition that affects the joints between your jaw and your skull. The TMJs are located near your ears and allow your jaw to open and close. These joints and the nearby muscles are involved in all movements of the jaw. People with TMJ syndrome have pain in the area of these joints and muscles. Chewing, biting, or other movements of the jaw can be difficult or painful. TMJ syndrome can be caused by various things. In many cases, the condition is mild and goes away within a few weeks. For some people, the condition can become a long-term problem. What are the causes? Possible causes of TMJ syndrome include:  Grinding your teeth or clenching your jaw. Some people do this when they are under stress.  Arthritis.  Injury to the jaw.  Head or neck injury.  Teeth or dentures that are not aligned well.  In some cases, the cause of TMJ syndrome may not be known. What are the signs or symptoms? The most common symptom is an aching pain on the side of the head in the area of the TMJ. Other symptoms may include:  Pain when moving your jaw, such as when chewing or biting.  Being unable to open your jaw all the way.  Making a clicking sound when you open your mouth.  Headache.  Earache.  Neck or shoulder pain.  How is this diagnosed? Diagnosis can usually be made based on your symptoms, your medical history, and a physical exam. Your health care provider may check the range of motion of your jaw. Imaging tests, such as X-rays or an MRI, are sometimes done. You may need to see your  dentist to determine if your teeth and jaw are lined up correctly. How is this treated? TMJ syndrome often goes away on its own. If treatment is needed, the options may include:  Eating soft foods and applying ice or heat.  Medicines to relieve pain or inflammation.  Medicines to relax the muscles.  A splint, bite plate, or mouthpiece to prevent teeth grinding or jaw clenching.  Relaxation techniques or counseling to help reduce stress.  Transcutaneous electrical nerve stimulation (TENS). This helps to relieve pain by applying an electrical current through the skin.  Acupuncture. This is sometimes helpful to relieve pain.  Jaw surgery. This is rarely needed.  Follow these instructions at home:  Take medicines only as directed by your health care provider.  Eat a soft diet if you are having trouble chewing.  Apply ice to the painful area. ? Put ice in a plastic bag. ? Place a towel between your skin and the bag. ? Leave the ice on for 20 minutes, 2-3 times a day.  Apply a warm compress to the painful area as directed.  Massage your jaw area and perform any jaw stretching exercises as recommended by your health care provider.  If you were given a mouthpiece or bite plate, wear it as directed.  Avoid foods that require a lot of chewing. Do not chew gum.  Keep all follow-up visits as  directed by your health care provider. This is important. Contact a health care provider if:  You are having trouble eating.  You have new or worsening symptoms. Get help right away if:  Your jaw locks open or closed. This information is not intended to replace advice given to you by your health care provider. Make sure you discuss any questions you have with your health care provider. Document Released: 10/08/2000 Document Revised: 09/13/2015 Document Reviewed: 08/18/2013 Elsevier Interactive Patient Education  [employer].

## 2017-01-20 NOTE — Assessment & Plan Note (Signed)
Deteriorated. Patient re-educated about  the importance of commitment to a  minimum of 150 minutes of exercise per week.  The importance of healthy food choices with portion control discussed. Encouraged to start a food diary, count calories and to consider  joining a support group. Sample diet sheets offered. Goals set by the patient for the next several months.   Weight /BMI 01/08/2017 12/31/2016 11/26/2016  WEIGHT 230 lb 220 lb 222 lb  HEIGHT 5\' 11"  5\' 11"  5\' 11"   BMI 32.08 kg/m2 30.68 kg/m2 30.96 kg/m2  Some encounter information is confidential and restricted. Go to Review Flowsheets activity to see all data.

## 2017-01-20 NOTE — Assessment & Plan Note (Signed)
Unchanged, medication management as before

## 2017-01-20 NOTE — Progress Notes (Signed)
Evan Jacobson     MRN: [id number]      DOB: [birth year]   HPI [name] is here for follow up of recent ED visit on 12/31/2016, when he presented with lightheadedness, which started immediately following dental work on the right side of his jaw. Symptoms of near syncope, or vertigo are aggravated on turning the head to the opposite side the left, he actually went to the ED on 12/31/2016 when he nearly lost consciousness while driving, and he had to call his wife to collect him. He is also c/o pain over the left jaw in the TMJ area when he chews or opens his mouth He is here to review labs done in the ED as well as treatment options moving forward as he is still symptomatic ROS See HPI  Denies recent fever or chills. Denies sinus pressure, nasal congestion, ear pain or sore throat. Denies chest congestion, productive cough or wheezing. Denies chest pains, palpitations and leg swelling Denies abdominal pain, nausea, vomiting,diarrhea or constipation.   Denies dysuria, frequency, hesitancy or incontinence.  Denies uncontrolled  depression, anxiety or insomnia. Denies skin break down or rash.   PE  BP (!) 130/92   Pulse 89   Resp 16   Ht 5\' 11"  (1.803 m)   Wt 230 lb (104.3 kg)   SpO2 96%   BMI 32.08 kg/m   Patient alert and oriented and in no cardiopulmonary distress.  HEENT: No facial asymmetry, EOMI, no nystagmus,  oropharynx pink and moist.  Neck supple no JVD, no mass.Tender over right TMJ  Chest: Clear to auscultation bilaterally.  CVS: S1, S2 no murmurs, no S3.Regular rate.  ABD: Soft non tender.   Ext: No edema  MS: Decreased  ROM lumbar spine,adequate in  shoulders, hips and knees.  Skin: Intact, no ulcerations or rash noted.  Psych: Good eye contact, normal affect. Memory intact not anxious or depressed appearing.  CNS: CN 2-12 intact, power,  normal throughout.no focal deficits noted.   Assessment & Plan  Encounter for examination following treatment  at hospital Treated on 12/31/2016 for lightheadedness in the ED, remains symptomatic. Antivert prescribed for vertigo, and he is also referred to ENT  Lightheadedness Antivert for as needed use for vertigo and ENT evaluation, pt also advised against driving until symptoms fully resolve  TMJ (temporomandibular joint syndrome) Pt education done , he is to use ibuprofen  For 5 to 7 days, apply ice and avoid excessive chewing  Overweight (BMI 25.0-29.9) Deteriorated. Patient re-educated about  the importance of commitment to a  minimum of 150 minutes of exercise per week.  The importance of healthy food choices with portion control discussed. Encouraged to start a food diary, count calories and to consider  joining a support group. Sample diet sheets offered. Goals set by the patient for the next several months.   Weight /BMI 01/08/2017 12/31/2016 11/26/2016  WEIGHT 230 lb 220 lb 222 lb  HEIGHT 5\' 11"  5\' 11"  5\' 11"   BMI 32.08 kg/m2 30.68 kg/m2 30.96 kg/m2  Some encounter information is confidential and restricted. Go to Review Flowsheets activity to see all data.      Essential hypertension Uncontrolled, no change in medication management, needs to work on lifestyle and weight loss DASH diet and commitment to daily physical activity for a minimum of 30 minutes discussed and encouraged, as a part of hypertension management. The importance of attaining a healthy weight is also discussed.  BP/Weight 01/08/2017 12/31/2016 11/26/2016 09/17/2016 08/19/2016 08/07/2016 07/03/2016  Systolic BP 710 626 948 546 270 350 093  Diastolic BP 92 97 84 82 80 82 93  Wt. (Lbs) 230 220 222 224.5 221.75 215 218  BMI 32.08 30.68 30.96 31.31 30.93 29.99 30.4  Some encounter information is confidential and restricted. Go to Review Flowsheets activity to see all data.       Back pain with left-sided radiculopathy Unchanged, medication management as before

## 2017-01-20 NOTE — Assessment & Plan Note (Signed)
Treated on 12/31/2016 for lightheadedness in the ED, remains symptomatic. Antivert prescribed for vertigo, and he is also referred to ENT

## 2017-01-20 NOTE — Assessment & Plan Note (Signed)
Antivert for as needed use for vertigo and ENT evaluation, pt also advised against driving until symptoms fully resolve

## 2017-01-20 NOTE — Assessment & Plan Note (Signed)
Pt education done , he is to use ibuprofen  For 5 to 7 days, apply ice and avoid excessive chewing

## 2017-01-20 NOTE — Assessment & Plan Note (Signed)
Uncontrolled, no change in medication management, needs to work on lifestyle and weight loss DASH diet and commitment to daily physical activity for a minimum of 30 minutes discussed and encouraged, as a part of hypertension management. The importance of attaining a healthy weight is also discussed.  BP/Weight 01/08/2017 12/31/2016 11/26/2016 09/17/2016 08/19/2016 1/85/6314 10/03/261  Systolic BP 785 885 027 741 287 867 672  Diastolic BP 92 97 84 82 80 82 93  Wt. (Lbs) 230 220 222 224.5 221.75 215 218  BMI 32.08 30.68 30.96 31.31 30.93 29.99 30.4  Some encounter information is confidential and restricted. Go to Review Flowsheets activity to see all data.

## 2017-01-21 ENCOUNTER — Encounter: Payer: Self-pay | Admitting: Family Medicine

## 2017-01-22 ENCOUNTER — Encounter: Payer: Self-pay | Admitting: Family Medicine

## 2017-01-26 ENCOUNTER — Telehealth: Payer: Self-pay

## 2017-01-26 DIAGNOSIS — R7301 Impaired fasting glucose: Secondary | ICD-10-CM

## 2017-01-26 DIAGNOSIS — I10 Essential (primary) hypertension: Secondary | ICD-10-CM

## 2017-01-26 NOTE — Telephone Encounter (Signed)
Labs ordered.

## 2017-01-27 LAB — HEMOGLOBIN A1C
Hgb A1c MFr Bld: 5.2 % of total Hgb (ref <5.7)
Mean Plasma Glucose: 103 (calc)
eAG (mmol/L): 5.7 (calc)

## 2017-01-27 LAB — TSH: TSH: 1.23 mIU/L (ref 0.40–4.50)

## 2017-01-27 LAB — LIPID PANEL
Cholesterol: 185 mg/dL (ref <200)
HDL: 49 mg/dL (ref >40)
LDL Cholesterol (Calc): 116 mg/dL (calc) — ABNORMAL HIGH
Non-HDL Cholesterol (Calc): 136 mg/dL (calc) — ABNORMAL HIGH (ref <130)
Total CHOL/HDL Ratio: 3.8 (calc) (ref <5.0)
Triglycerides: 98 mg/dL (ref <150)

## 2017-01-27 LAB — COMPLETE METABOLIC PANEL WITH GFR
AG Ratio: 1.4 (calc) (ref 1.0–2.5)
ALT: 25 U/L (ref 9–46)
AST: 20 U/L (ref 10–35)
Albumin: 4.2 g/dL (ref 3.6–5.1)
Alkaline phosphatase (APISO): 64 U/L (ref 40–115)
BUN: 16 mg/dL (ref 7–25)
CO2: 28 mmol/L (ref 20–32)
Calcium: 9.3 mg/dL (ref 8.6–10.3)
Chloride: 104 mmol/L (ref 98–110)
Creat: 1.22 mg/dL (ref 0.70–1.33)
GFR, Est African American: 80 mL/min/{1.73_m2} (ref >=60)
GFR, Est Non African American: 69 mL/min/{1.73_m2} (ref >=60)
Globulin: 2.9 g/dL (calc) (ref 1.9–3.7)
Glucose, Bld: 90 mg/dL (ref 65–99)
Potassium: 4.1 mmol/L (ref 3.5–5.3)
Sodium: 138 mmol/L (ref 135–146)
Total Bilirubin: 0.5 mg/dL (ref 0.2–1.2)
Total Protein: 7.1 g/dL (ref 6.1–8.1)

## 2017-01-27 LAB — CBC
HCT: 43.4 % (ref 38.5–50.0)
Hemoglobin: 15 g/dL (ref 13.2–17.1)
MCH: 29.6 pg (ref 27.0–33.0)
MCHC: 34.6 g/dL (ref 32.0–36.0)
MCV: 85.8 fL (ref 80.0–100.0)
MPV: 11.3 fL (ref 7.5–12.5)
Platelets: 217 10*3/uL (ref 140–400)
RBC: 5.06 10*6/uL (ref 4.20–5.80)
RDW: 12 % (ref 11.0–15.0)
WBC: 4.3 10*3/uL (ref 3.8–10.8)

## 2017-01-28 ENCOUNTER — Encounter: Payer: Self-pay | Admitting: Family Medicine

## 2017-01-29 ENCOUNTER — Telehealth: Payer: Self-pay | Admitting: Family Medicine

## 2017-01-29 ENCOUNTER — Other Ambulatory Visit: Payer: Self-pay | Admitting: Family Medicine

## 2017-01-29 DIAGNOSIS — M545 Low back pain: Secondary | ICD-10-CM | POA: Diagnosis not present

## 2017-01-29 DIAGNOSIS — R42 Dizziness and giddiness: Secondary | ICD-10-CM

## 2017-01-29 DIAGNOSIS — G4452 New daily persistent headache (NDPH): Secondary | ICD-10-CM

## 2017-01-29 DIAGNOSIS — G8929 Other chronic pain: Secondary | ICD-10-CM | POA: Diagnosis not present

## 2017-01-29 DIAGNOSIS — I10 Essential (primary) hypertension: Secondary | ICD-10-CM | POA: Diagnosis not present

## 2017-01-29 NOTE — Telephone Encounter (Signed)
Pls see referral for this pt to neurology asap to evaluate 1 month h/o lightheadedness and new daily headaches, the office in [place] is likely to he has sent 3 e mails to me since last week about his symptoms which have been going on since the beginnings of December, I  have sent a response today which he will receive in am and will be expecting follow up from the office.Also he was referred to [facility] to evaluate vertigo since Dec 5 , he states the soonest he can be seen is April , if this is indeed the case, pls attempt to get SOONER appt with ANY ENT Doc ,  Pls let me know if you have any concerns, thanks I will only both  referrals  With no specific Doc or practice named , he has Private ins,

## 2017-01-29 NOTE — Telephone Encounter (Signed)
Pt has been sending e messages since last week c/o ongoing light headedness since 12/05/visit. I have entered referral for carotid doppler US, pls schedule asap and let him know appt info, I have sent a response to him which he will get on 01/30/2017  I am also going to refer him to neurology , he requested a brain scan, and I am also ordering an MRI brain no contrast for new vertigo, light headedness and persistent headache, pls also schedule, he asked for CT scan but I am ordering an MI as more informative , pls also schedule this and let him know  I will send a separate tele message to referral desk re neurology appt request and also re sooner ENT appt with alternative Provider, Thanks. Question, please ask!

## 2017-01-30 NOTE — Telephone Encounter (Signed)
Left a voicemail with [facility] to schedule appt, send through epic urgently as well. [facility] to schedule appt, they requested last note with referral information, I faxed this with *urgent note on cover sheet.

## 2017-02-02 ENCOUNTER — Encounter: Payer: Self-pay | Admitting: Family Medicine

## 2017-02-02 NOTE — Telephone Encounter (Signed)
Patient aware of the dopplar and appt info. Referral staff will contact him with neurology appt

## 2017-02-02 NOTE — Telephone Encounter (Signed)
Noted, thanks!

## 2017-02-03 ENCOUNTER — Encounter: Payer: Self-pay | Admitting: Family Medicine

## 2017-02-04 ENCOUNTER — Encounter: Payer: Self-pay | Admitting: Family Medicine

## 2017-02-04 ENCOUNTER — Ambulatory Visit (HOSPITAL_COMMUNITY)
Admission: RE | Admit: 2017-02-04 | Discharge: 2017-02-04 | Disposition: A | Discharge status: Home / Self Care | Payer: 59 | Source: Ambulatory Visit | Attending: Family Medicine | Admitting: Family Medicine

## 2017-02-04 DIAGNOSIS — R42 Dizziness and giddiness: Secondary | ICD-10-CM | POA: Diagnosis not present

## 2017-02-06 DIAGNOSIS — R55 Syncope and collapse: Secondary | ICD-10-CM | POA: Diagnosis not present

## 2017-02-06 DIAGNOSIS — R42 Dizziness and giddiness: Secondary | ICD-10-CM | POA: Diagnosis not present

## 2017-02-12 ENCOUNTER — Ambulatory Visit (INDEPENDENT_AMBULATORY_CARE_PROVIDER_SITE_OTHER): Payer: 59 | Admitting: Family Medicine

## 2017-02-12 ENCOUNTER — Encounter: Payer: Self-pay | Admitting: Family Medicine

## 2017-02-12 VITALS — BP 124/90 | HR 72 | Resp 16 | Ht 71.0 in | Wt 220.0 lb

## 2017-02-12 DIAGNOSIS — R52 Pain, unspecified: Secondary | ICD-10-CM | POA: Diagnosis not present

## 2017-02-12 DIAGNOSIS — Z1211 Encounter for screening for malignant neoplasm of colon: Secondary | ICD-10-CM

## 2017-02-12 DIAGNOSIS — Z0000 Encounter for general adult medical examination without abnormal findings: Secondary | ICD-10-CM

## 2017-02-12 LAB — POC HEMOCCULT BLD/STL (OFFICE/1-CARD/DIAGNOSTIC): Fecal Occult Blood, POC: NEGATIVE

## 2017-02-12 MED ORDER — HYDROCODONE-ACETAMINOPHEN 10-325 MG PO TABS: 1.0000 | ORAL_TABLET | Freq: Two times a day (BID) | ORAL | 0 refills | Status: DC | PRN

## 2017-02-12 MED ORDER — HYDROCODONE-ACETAMINOPHEN 10-325 MG PO TABS: ORAL_TABLET | ORAL | 0 refills | Status: DC

## 2017-02-12 NOTE — Patient Instructions (Signed)
F/u last week in March or first week in April, call if  You need me sooner  Pain management is unchanged  Continue healthy lifestyle changes and congratulations on weight loss  Discuss meditation with therapist and hopefully your sleep will soon improve  You are being referred for a colonoscopy  Thank you  for choosing [facility]. We consider it a privelige to serve you.  Delivering excellent health care in a caring and  compassionate way is our goal.  Partnering with you,  so that together we can achieve this goal is our strategy.

## 2017-02-12 NOTE — Progress Notes (Signed)
Cardiology Office Note    Date:  02/13/2017   ID:  BOBBYE REINITZ, DOB [birth year], MRN [id number]  PCP:  Fayrene Helper, MD  Cardiologist: Dr. Domenic Polite   Chief Complaint  Patient presents with  . Follow-up    evaluation of lightheadedness    History of Present Illness:    Evan Jacobson is a 52 y.o. male with past medical history of palpitations (PAC's and PVC's by prior monitor) and HTN who presents to the office today for evaluation of lightheadedness and presyncope.   He was last examined by Dr. Domenic Polite in 07/2016 and reported occasional palpitations at that time. His recent Holter monitor was reviewed which showed PVC's and occasional PAC's with one 3-beat run of NSVT. A stress echocardiogram was also obtained for ischemic evaluation and showed a preserved EF with no diagnostic evidence of stress-induced ischemia.   In talking with the patient today, he reports having significant episodes of lightheadedness and presyncope over the past month. He was evaluated by his PCP who checked numerous labs at that time and found no acute abnormalities. He was referred to an ENT provider who found no cause for his symptoms. Carotid Dopplers were also performed and showed no significant stenosis. Scheduled for a Brain MRI later this month.   He describes the episodes as feeling like he is going to pass out but never loses consciousness. Does not feel like the room is spinning. Has noticed occasional palpitations and a "tingling sensation" across his left pectoral region when this occurs but not always present with the episodes. He denies any slurred speech, lower or upper extremity weakness, diaphoresis, or vision changes. No associated headaches. No recent chest discomfort or dyspnea on exertion.   Past Medical History:  Diagnosis Date  . Allergic rhinitis   . Bipolar disorder ([place]) 08/25/2011  . Chronic back pain   . Concussion 2012  . GERD (gastroesophageal reflux disease)   .  Hypertension   . Post-concussion syndrome 08/15/2010  . PTSD (post-traumatic stress disorder)     History reviewed. No pertinent surgical history.  Current Medications: Outpatient Medications Prior to Visit  Medication Sig Dispense Refill  . amLODipine (NORVASC) 2.5 MG tablet Take 3 tablets (7.5 mg total) by mouth daily. 270 tablet 1  . aspirin 81 MG tablet Take 1 tablet (81 mg total) by mouth daily. 100 tablet 3  . fluticasone (FLONASE) 50 MCG/ACT nasal spray Place 2 sprays into both nostrils daily as needed for allergies or rhinitis.    Marland Kitchen HYDROcodone-acetaminophen (NORCO) 10-325 MG tablet Take 1 tablet by mouth 2 (two) times daily as needed. 60 tablet 0  . HYDROcodone-acetaminophen (NORCO) 10-325 MG tablet One tablet twice daily 60 tablet 0  . HYDROcodone-acetaminophen (NORCO) 10-325 MG tablet One tablet twice daily 60 tablet 0  . KRILL OIL PO Take 1 capsule by mouth daily.    . Multiple Vitamin (MULTIVITAMIN WITH MINERALS) TABS tablet Take 1 tablet by mouth daily.     No facility-administered medications prior to visit.      Allergies:   Ace inhibitors and Sulfonamide derivatives   Social History   Socioeconomic History  . Marital status: Married    Spouse name: None  . Number of children: 2  . Years of education: None  . Highest education level: None  Social Needs  . Financial resource strain: None  . Food insecurity - worry: None  . Food insecurity - inability: None  . Transportation needs - medical: None  .  Transportation needs - non-medical: None  Occupational History    Comment: Geophysicist/field seismologist: [employer]  Tobacco Use  . Smoking status: Never Smoker  . Smokeless tobacco: Never Used  Substance and Sexual Activity  . Alcohol use: Yes    Comment: occ; six pack per week  . Drug use: No  . Sexual activity: Yes    Birth control/protection: None  Other Topics Concern  . None  Social History Narrative  . None     Family History:  The patient's family  history includes Heart disease in his mother; Hypertension in his brother, brother, father, mother, sister, and sister; Stroke in his father.   Review of Systems:   Please see the history of present illness.     General:  No chills, fever, night sweats or weight changes. Positive for presyncope. Cardiovascular:  No chest pain, dyspnea on exertion, edema, orthopnea, paroxysmal nocturnal dyspnea. Positive for palpitations.  Dermatological: No rash, lesions/masses Respiratory: No cough, dyspnea Urologic: No hematuria, dysuria Abdominal:   No nausea, vomiting, diarrhea, bright red blood per rectum, melena, or hematemesis Neurologic:  No visual changes, wkns, changes in mental status. All other systems reviewed and are otherwise negative except as noted above.   Physical Exam:    VS:  BP 124/64 (BP Location: Right Arm)   Pulse 67   Ht 5\' 11"  (1.803 m)   Wt 219 lb (99.3 kg)   SpO2 98%   BMI 30.54 kg/m    General: Well developed, well nourished [place] American male appearing in no acute distress. Head: Normocephalic, atraumatic, sclera non-icteric, no xanthomas, nares are without discharge.  Neck: No carotid bruits. JVD not elevated.  Lungs: Respirations regular and unlabored, without wheezes or rales.  Heart: Regular rate and rhythm. No S3 or S4.  No murmur, no rubs, or gallops appreciated. Abdomen: Soft, non-tender, non-distended with normoactive bowel sounds. No hepatomegaly. No rebound/guarding. No obvious abdominal masses. Msk:  Strength and tone appear normal for age. No joint deformities or effusions. Extremities: No clubbing or cyanosis. No lower extremity edema.  Distal pedal pulses are 2+ bilaterally. Neuro: Alert and oriented X 3. Moves all extremities spontaneously. No focal deficits noted. Psych:  Responds to questions appropriately with a normal affect. Skin: No rashes or lesions noted  Wt Readings from Last 3 Encounters:  02/13/17 219 lb (99.3 kg)  02/12/17 220 lb (99.8  kg)  01/08/17 230 lb (104.3 kg)     Studies/Labs Reviewed:   EKG:  EKG is ordered today.  The ekg ordered today demonstrates NSR, HR 83, with no acute ST or T-wave changes when compared to prior tracings.   Recent Labs: 01/26/2017: ALT 25; BUN 16; Creat 1.22; Hemoglobin 15.0; Platelets 217; Potassium 4.1; Sodium 138; TSH 1.23   Lipid Panel    Component Value Date/Time   CHOL 185 01/26/2017 0918   TRIG 98 01/26/2017 0918   HDL 49 01/26/2017 0918   CHOLHDL 3.8 01/26/2017 0918   VLDL 14 06/18/2016 0936   LDLCALC 97 06/18/2016 0936    Additional studies/ records that were reviewed today include:   Stress Echo: 06/2016 Baseline:  - LV size was normal. - LV global systolic function was normal. The estimated LV ejection   fraction was 55-60%. - Normal wall motion; no LV regional wall motion abnormalities.  Peak stress:  - LV size was reduced appropriately. - LV global systolic function was hyperdynamic. The estimated LV   ejection fraction was 70-75%. - No evidence  for new LV regional wall motion abnormalities.  ------------------------------------------------------------------- Stress echo results:     There was no diagnostic evidence for stress-induced ischemia.  Holter Monitor: 06/2016  Predominantly sinus rhythm with rare PVC's and occasional PAC's.  One 3-beat run of NSVT correlated with symptoms of chest discomfort. Another episode of chest discomfort correlated with sinus rhythm.  Sinus tachycardia also noted, HR 148 bpm.    Assessment:    1. Pre-syncope   2. Episodic lightheadedness   3. Palpitations   4. Essential hypertension      Plan:   In order of problems listed above:  1. Presyncope/Palpitations - the patient reports having intermittent episodes of lightheadedness and presyncope over the past month which can last for seconds to minutes at a time. Does occasionally experience associated palpitations. Reports symptoms are similar to one day  when his Holter monitor was in place previously (had PAC's and PVC's with one episode of 3-beat run of NSVT at that time) but symptoms are now more severe and occurring more frequently.  - TSH and electrolytes checked by PCP and WNL. Referred to ENT provider who found no cause for his symptoms. Carotid Dopplers were also performed and showed no significant stenosis. Scheduled for a Brain MRI later this month.  - EKG today shows he is in NSR with no acute ST or T-wave changes. Recent stress echo showed an acute abnormalities.  - with his presyncope and palpitations, need to rule-out pathologic arrhythmias. Will plan for a 30-day cardiac event monitor. Can consider initiation of BB therapy pending monitor results.   2. HTN - BP is well-controlled at 124/64 during today's visit. - continue Amlodipine 7.5mg  daily.    Medication Adjustments/Labs and Tests Ordered: Current medicines are reviewed at length with the patient today.  Concerns regarding medicines are outlined above.  Medication changes, Labs and Tests ordered today are listed in the Patient Instructions below. Patient Instructions  Medication Instructions:  Your physician recommends that you continue on your current medications as directed. Please refer to the Current Medication list given to you today.   Labwork: none  Testing/Procedures: Your physician has recommended that you wear an event monitor. Event monitors are medical devices that record the heart's electrical activity. Doctors most often [place] these monitors to diagnose arrhythmias. Arrhythmias are problems with the speed or rhythm of the heartbeat. The monitor is a small, portable device. You can wear one while you do your normal daily activities. This is usually used to diagnose what is causing palpitations/syncope (passing out).  Follow-Up: Your physician recommends that you schedule a follow-up appointment in: 4 months   Any Other Special Instructions Will Be Listed Below  (If Applicable).  If you need a refill on your cardiac medications before your next appointment, please call your pharmacy.    Signed, Erma Heritage, PA-C  02/13/2017 8:25 PM    [facility] [address] [place], [place] [address] Phone: [phone]

## 2017-02-12 NOTE — Progress Notes (Signed)
Fill Date ID Written Drug Qty Days Prescriber Rx # Pharmacy Refill Daily Dose * Pymt Type PMP  01/30/2017  1   11/26/2016  Hydrocodone-Acetamin 10-325 MG  60 30 Ma Sim  [id number] [facility] ([id number])  0 20.00 MME  Comm Ins  [place]  12/24/2016  3   11/26/2016  Hydrocodone-Acetamin 10-325 MG  60 30 Ma Sim  [id number] [facility] ([id number])  0 20.00 MME  Comm Ins  [place]  11/26/2016  1   11/26/2016  Hydrocodone-Acetamin 10-325 MG  60 30 Ma Sim  [id number] [facility] ([id number])  0 20.00 MME  Comm Ins  [place]  10/17/2016  1   08/19/2016  Hydrocodone-Acetamin 10-325 MG  60 30 Ma Sim  [id number] Nor ([id number])  0 20.00 MME  Comm Ins  [place]  09/17/2016  1   09/17/2016  Hydrocodone-Acetamin 10-325 MG  60 30 Ma Sim  [id number] [facility] ([id number])  0 20.00 MME  Comm Ins  [place]  08/19/2016  1   08/19/2016  Hydrocodone-Acetamin 10-325 MG  60 30 Ma Sim  [id number] [facility] ([id number])  0 20.00 MME  Comm Ins  [place]  06/06/2016  1   06/06/2016  Hydrocodone-Acetamin 10-325 MG  60 30 Ma Sim  [id number] [facility] ([id number])  0 20.00 MME  Comm Ins    04/29/2016  1   03/27/2016  Hydrocodone-Acetamin 10-325 MG             Registry reviewed at visit

## 2017-02-13 ENCOUNTER — Encounter: Payer: Self-pay | Admitting: Student

## 2017-02-13 ENCOUNTER — Ambulatory Visit (INDEPENDENT_AMBULATORY_CARE_PROVIDER_SITE_OTHER): Payer: 59

## 2017-02-13 ENCOUNTER — Ambulatory Visit: Payer: 59 | Admitting: Student

## 2017-02-13 VITALS — BP 124/64 | HR 67 | Ht 71.0 in | Wt 219.0 lb

## 2017-02-13 DIAGNOSIS — R55 Syncope and collapse: Secondary | ICD-10-CM | POA: Diagnosis not present

## 2017-02-13 DIAGNOSIS — R42 Dizziness and giddiness: Secondary | ICD-10-CM | POA: Diagnosis not present

## 2017-02-13 DIAGNOSIS — I10 Essential (primary) hypertension: Secondary | ICD-10-CM | POA: Diagnosis not present

## 2017-02-13 DIAGNOSIS — R002 Palpitations: Secondary | ICD-10-CM

## 2017-02-13 NOTE — Patient Instructions (Signed)
Medication Instructions:  Your physician recommends that you continue on your current medications as directed. Please refer to the Current Medication list given to you today.   Labwork: none  Testing/Procedures: Your physician has recommended that you wear an event monitor. Event monitors are medical devices that record the heart's electrical activity. Doctors most often [place] these monitors to diagnose arrhythmias. Arrhythmias are problems with the speed or rhythm of the heartbeat. The monitor is a small, portable device. You can wear one while you do your normal daily activities. This is usually used to diagnose what is causing palpitations/syncope (passing out).    Follow-Up: Your physician recommends that you schedule a follow-up appointment in: 4 months    Any Other Special Instructions Will Be Listed Below (If Applicable).     If you need a refill on your cardiac medications before your next appointment, please call your pharmacy.

## 2017-02-16 ENCOUNTER — Encounter: Payer: Self-pay | Admitting: Student

## 2017-02-16 DIAGNOSIS — R55 Syncope and collapse: Secondary | ICD-10-CM | POA: Diagnosis not present

## 2017-02-17 ENCOUNTER — Other Ambulatory Visit: Payer: Self-pay | Admitting: Family Medicine

## 2017-02-17 ENCOUNTER — Encounter: Payer: Self-pay | Admitting: Family Medicine

## 2017-02-17 ENCOUNTER — Telehealth: Payer: Self-pay

## 2017-02-17 DIAGNOSIS — R52 Pain, unspecified: Secondary | ICD-10-CM | POA: Insufficient documentation

## 2017-02-17 DIAGNOSIS — Z1211 Encounter for screening for malignant neoplasm of colon: Secondary | ICD-10-CM

## 2017-02-17 NOTE — Telephone Encounter (Signed)
Approved number is C [phone]

## 2017-02-17 NOTE — Assessment & Plan Note (Signed)

## 2017-02-17 NOTE — Progress Notes (Signed)
   Evan Jacobson     MRN: [id number]      DOB: 05/07/1965   HPI: Patient is in for annual physical exam. Chronic pain managementi addressed. Reports adequate control on current regime for his chronic back pain. His controlled substance record is reviewed, and medication is prescribed for 12 weeks He has seen ENT for recurrent ight headedness which started in November, has been cleared of BPV , and has upcoming cardiology eval, still needs neuro appt, states gradually becoming less severe. Has changed diet and lifestyle since onset,no alcohol Immunization is reviewed , refuses flu vaccine   PE; BP 124/90   Pulse 72   Resp 16   Ht 5\' 11"  (1.803 m)   Wt 220 lb (99.8 kg)   SpO2 97%   BMI 30.68 kg/m   Pleasant male, alert and oriented x 3, in no cardio-pulmonary distress. Afebrile. HEENT No facial trauma or asymetry. Sinuses non tender. EOMI, pupils equally reactive to light.No nystagmus External ears normal, tympanic membranes clear. Oropharynx moist, no exudate. Neck: supple, no adenopathy,JVD or thyromegaly.No bruits.  Chest: Clear to ascultation bilaterally.No crackles or wheezes. Non tender to palpation  Breast: No asymetry,no masses. No nipple discharge or inversion. No axillary or supraclavicular adenopathy  Cardiovascular system; Heart sounds normal,  S1 and  S2 ,no S3.  No murmur, or thrill. Apical beat not displaced Peripheral pulses normal.  Abdomen: Soft, non tender, no organomegaly or masses. No bruits. Bowel sounds normal. No guarding, tenderness or rebound.  Rectal:  Normal sphincter tone. No hemorrhoids or  masses. guaiac negative stool. Prostate smooth and firm    Musculoskeletal exam: Decreased ROM of lumbar spine,normal in  hips , shoulders and knees. No deformity ,swelling or crepitus noted. No muscle wasting or atrophy.   Neurologic: Cranial nerves 2 to 12 intact. Power, tone ,sensation and reflexes normal throughout. No disturbance  in gait. No tremor.  Skin: Intact, no ulceration, erythema , scaling or rash noted. Pigmentation normal throughout  Psych; Normal mood and affect. Judgement and concentration normal   Assessment & Plan:  Annual physical exam Annual exam as documented. Counseling done  re healthy lifestyle involving commitment to 150 minutes exercise per week, heart healthy diet, and attaining healthy weight.The importance of adequate sleep also discussed. Regular seat belt use and home safety, is also discussed. Changes in health habits are decided on by the patient with goals and time frames  set for achieving them. Immunization and cancer screening needs are specifically addressed at this visit.   Encounter for pain management The patient's Controlled Substance registry is reviewed and compliance confirmed. Adequacy of  Pain control and level of function is assessed. Medication dosing is adjusted as deemed appropriate. Twelve weeks of medication is prescribed , patient signs for the script and is provided with a follow up appointment between 11 to 12 weeks .

## 2017-02-17 NOTE — Telephone Encounter (Signed)
MRI of the brain w/o contrast was DENIED. Peer to Peer review needed- [phone] Opt 3 Case # [id number].   MRI is scheduled for this Thursday. If peer to peer cannot be done before then, I will call pt and reschedule appt. (It was scheduled because I didn't think his insurance required a precert on MRI but it does)

## 2017-02-17 NOTE — Assessment & Plan Note (Signed)
The patient's Controlled Substance registry is reviewed and compliance confirmed. Adequacy of  Pain control and level of function is assessed. Medication dosing is adjusted as deemed appropriate. Twelve weeks of medication is prescribed , patient signs for the script and is provided with a follow up appointment between 11 to 12 weeks .  

## 2017-02-17 NOTE — Telephone Encounter (Signed)
Noted and approval number entered

## 2017-02-18 ENCOUNTER — Encounter (INDEPENDENT_AMBULATORY_CARE_PROVIDER_SITE_OTHER): Payer: Self-pay | Admitting: *Deleted

## 2017-02-19 ENCOUNTER — Encounter: Payer: Self-pay | Admitting: Family Medicine

## 2017-02-19 ENCOUNTER — Ambulatory Visit (HOSPITAL_COMMUNITY)
Admission: RE | Admit: 2017-02-19 | Discharge: 2017-02-19 | Disposition: A | Discharge status: Home / Self Care | Payer: 59 | Source: Ambulatory Visit | Attending: Family Medicine | Admitting: Family Medicine

## 2017-02-19 DIAGNOSIS — R600 Localized edema: Secondary | ICD-10-CM | POA: Diagnosis not present

## 2017-02-19 DIAGNOSIS — R42 Dizziness and giddiness: Secondary | ICD-10-CM

## 2017-02-19 DIAGNOSIS — G4452 New daily persistent headache (NDPH): Secondary | ICD-10-CM | POA: Insufficient documentation

## 2017-02-23 DIAGNOSIS — R519 Headache, unspecified: Secondary | ICD-10-CM | POA: Insufficient documentation

## 2017-02-23 DIAGNOSIS — R42 Dizziness and giddiness: Secondary | ICD-10-CM | POA: Diagnosis not present

## 2017-02-27 ENCOUNTER — Telehealth: Payer: Self-pay | Admitting: Family Medicine

## 2017-02-27 NOTE — Telephone Encounter (Signed)
Pt stopped by --the feeling he is having is when you are on a Elevator... He wants to be referred to a research based hosp

## 2017-03-02 ENCOUNTER — Telehealth: Payer: Self-pay | Admitting: Family Medicine

## 2017-03-02 ENCOUNTER — Encounter: Payer: Self-pay | Admitting: Family Medicine

## 2017-03-02 ENCOUNTER — Telehealth: Payer: Self-pay | Admitting: Cardiology

## 2017-03-02 NOTE — Telephone Encounter (Signed)
Pt is having problems w/ his heart monitor, he doesn't have any service at his home to send the transmissions. He's been lightheaded this morning for about 3 hrs, can be reached @ [phone]

## 2017-03-02 NOTE — Telephone Encounter (Signed)
Pt spoke with [employer] earlier, confirmed they were receiving transmissions, we have not had any critical events faxed to [place]

## 2017-03-02 NOTE — Telephone Encounter (Signed)
Patient called this morning to check the status of his paperwork (I let him know this is usually a 5-7 day process- business days)  He is also requesting a prescription for a sinus infection  [facility] [place]  Cb#: [phone]

## 2017-03-03 ENCOUNTER — Encounter: Payer: Self-pay | Admitting: Family Medicine

## 2017-03-03 DIAGNOSIS — F4320 Adjustment disorder, unspecified: Secondary | ICD-10-CM

## 2017-03-03 DIAGNOSIS — I10 Essential (primary) hypertension: Secondary | ICD-10-CM

## 2017-03-03 DIAGNOSIS — K219 Gastro-esophageal reflux disease without esophagitis: Secondary | ICD-10-CM

## 2017-03-03 DIAGNOSIS — F528 Other sexual dysfunction not due to a substance or known physiological condition: Secondary | ICD-10-CM

## 2017-03-03 NOTE — Telephone Encounter (Signed)
Asking for status of FMLA. I don't have it. Was it put in your box?

## 2017-03-03 NOTE — Telephone Encounter (Signed)
I spoke with patient , he is now requesting evaluation at [facility] since states he is still so light headed he stayed in bed for 18 hors yesterday, this despite all  Testing he has had locally, he has seen ENt, cardiology and is now having neurology evaluation. I am currently working on the [employer] for his wife I will refer him to neurology at [facility]

## 2017-03-03 NOTE — Telephone Encounter (Signed)
I do not understand and will address at his next visit, tythe patient is still being evaluated  And at this most recent visit told me that he was improving

## 2017-03-04 ENCOUNTER — Telehealth: Payer: Self-pay | Admitting: Cardiovascular Disease

## 2017-03-04 ENCOUNTER — Telehealth: Payer: Self-pay | Admitting: Family Medicine

## 2017-03-04 NOTE — Telephone Encounter (Signed)
Evan Jacobson w/ [employer] needs DX code for event monitor. /tg

## 2017-03-04 NOTE — Telephone Encounter (Signed)
Called to give DX Code for pt's heart monitor. Used Arrhythmia  (149.9)

## 2017-03-04 NOTE — Telephone Encounter (Signed)
Pt is calling stating he is sick with a sinus infection, started a week ago, no fever, Cough, Green\ Yellowish mucus, I told him he needed to be seen, and to call the office in the AM for a sick visit,,, Nothing that you need to do, brandi--just an FYI to you

## 2017-03-06 ENCOUNTER — Other Ambulatory Visit: Payer: Self-pay | Admitting: Family Medicine

## 2017-03-06 DIAGNOSIS — R42 Dizziness and giddiness: Secondary | ICD-10-CM

## 2017-03-06 NOTE — Progress Notes (Signed)
amb card

## 2017-03-08 DIAGNOSIS — R569 Unspecified convulsions: Secondary | ICD-10-CM | POA: Diagnosis not present

## 2017-03-11 DIAGNOSIS — R42 Dizziness and giddiness: Secondary | ICD-10-CM | POA: Diagnosis not present

## 2017-03-13 ENCOUNTER — Encounter: Payer: Self-pay | Admitting: Family Medicine

## 2017-03-15 ENCOUNTER — Encounter: Payer: Self-pay | Admitting: Family Medicine

## 2017-03-16 ENCOUNTER — Encounter: Payer: Self-pay | Admitting: Family Medicine

## 2017-03-18 DIAGNOSIS — R42 Dizziness and giddiness: Secondary | ICD-10-CM | POA: Diagnosis not present

## 2017-03-24 ENCOUNTER — Telehealth: Payer: Self-pay | Admitting: Family Medicine

## 2017-03-24 DIAGNOSIS — R569 Unspecified convulsions: Secondary | ICD-10-CM | POA: Insufficient documentation

## 2017-03-24 NOTE — Telephone Encounter (Signed)
Needs a RX for Bed Rest for 5 days his back is out, per the PT and the [facility]. Please call when completed

## 2017-03-24 NOTE — Telephone Encounter (Signed)
Patient having pain on right side below his ribs, he thinks its his kidneys. He is requesting an appt. Let me know when a good time is to schedule considering we are past 100% for the next several weeks.  Cb#: [phone]

## 2017-03-24 NOTE — Telephone Encounter (Signed)
Please type a letter documenting that pt reports  An  average 8 to 10 episodes of incapacitating back pain per year, each lasting  4 to 6 days per episode.  Thanks, I will sign

## 2017-03-26 NOTE — Telephone Encounter (Signed)
Letter sent.

## 2017-03-26 NOTE — Telephone Encounter (Signed)
Requested letter has been typed

## 2017-03-27 ENCOUNTER — Other Ambulatory Visit: Payer: Self-pay | Admitting: Family Medicine

## 2017-03-27 DIAGNOSIS — R42 Dizziness and giddiness: Secondary | ICD-10-CM

## 2017-03-27 NOTE — Telephone Encounter (Signed)
Work in where able

## 2017-04-08 DIAGNOSIS — R42 Dizziness and giddiness: Secondary | ICD-10-CM | POA: Diagnosis not present

## 2017-04-08 DIAGNOSIS — H903 Sensorineural hearing loss, bilateral: Secondary | ICD-10-CM | POA: Diagnosis not present

## 2017-04-23 ENCOUNTER — Encounter: Payer: Self-pay | Admitting: Family Medicine

## 2017-04-23 ENCOUNTER — Ambulatory Visit (INDEPENDENT_AMBULATORY_CARE_PROVIDER_SITE_OTHER): Payer: 59 | Admitting: Family Medicine

## 2017-04-23 ENCOUNTER — Other Ambulatory Visit (INDEPENDENT_AMBULATORY_CARE_PROVIDER_SITE_OTHER): Payer: Self-pay | Admitting: *Deleted

## 2017-04-23 VITALS — BP 128/84 | HR 94 | Resp 16 | Ht 71.0 in | Wt 216.0 lb

## 2017-04-23 DIAGNOSIS — R42 Dizziness and giddiness: Secondary | ICD-10-CM

## 2017-04-23 DIAGNOSIS — Z889 Allergy status to unspecified drugs, medicaments and biological substances status: Secondary | ICD-10-CM

## 2017-04-23 DIAGNOSIS — J302 Other seasonal allergic rhinitis: Secondary | ICD-10-CM

## 2017-04-23 DIAGNOSIS — I10 Essential (primary) hypertension: Secondary | ICD-10-CM | POA: Diagnosis not present

## 2017-04-23 DIAGNOSIS — Z1211 Encounter for screening for malignant neoplasm of colon: Secondary | ICD-10-CM

## 2017-04-23 MED ORDER — SCOPOLAMINE 1 MG/3DAYS TD PT72: 1.0000 | MEDICATED_PATCH | TRANSDERMAL | 0 refills | Status: DC

## 2017-04-23 MED ORDER — AZELASTINE HCL 0.1 % NA SOLN: 2.0000 | Freq: Two times a day (BID) | NASAL | 12 refills | Status: DC

## 2017-04-23 MED ORDER — FLUTICASONE PROPIONATE 50 MCG/ACT NA SUSP: 2.0000 | Freq: Every day | NASAL | 6 refills | Status: DC

## 2017-04-23 NOTE — Patient Instructions (Signed)
F/u May 21 , call if you need me sooner   Thanklful you are doing better  You are referred for allergy testing in [place].  Use daily 2 nasal sprays and xyzal and flush with saline

## 2017-04-23 NOTE — Progress Notes (Signed)
   Evan Jacobson     MRN: [id number]      DOB: [birth year]   HPI Mr. Snellgrove is here for follow up of his c/o dizziness which started last Fall after dental work. After extensive ENT, neurologic , and cardiology evaluation he is now essentially symptom free, and has been advised that the problem is an ENT problem , and he has been educated re the exercises needed to relieve his symptom when it occurs and he says that they do work, and he is now fully functional and able to safely drive, and plans to take a 5 day cruise in the near future with his extended family, which he had planned to forgo if he remained symptomatic Requests patches for motion sickness Increased and uncontrolled allergy symptoms , will add 2nd nasal spray and requests referral to local allergist , has used immunotherapy in the past ROS Denies recent fever or chills. Denies sinus pressure,c/o excess nasal congestion, denies  ear pain or sore throat. Denies chest congestion, productive cough or wheezing. Denies chest pains, palpitations and leg swelling Denies abdominal pain, nausea, vomiting,diarrhea or constipation.   Denies dysuria, frequency, hesitancy or incontinence. Denies uncontrolled  joint pain, swelling and limitation in mobility. Denies headaches, seizures, numbness, or tingling. Denies depression, anxiety or insomnia. Denies skin break down or rash.   PE  BP 128/84   Pulse 94   Resp 16   Ht 5\' 11"  (1.803 m)   Wt 216 lb (98 kg)   SpO2 98%   BMI 30.13 kg/m   Patient alert and oriented and in no cardiopulmonary distress.  HEENT: No facial asymmetry, EOMI,   oropharynx pink and moist.  Neck supple no JVD, no mass.No sinus tenderness, excessive erythema and edema of nasal mucosa, conjunctiva erythematous with excess tearing   Chest: Clear to auscultation bilaterally.  CVS: S1, S2 no murmurs, no S3.Regular rate.  ABD: Soft non tender.   Ext: No edema  MS: Adequate ROM spine, shoulders, hips and  knees.  Skin: Intact, no ulcerations or rash noted.  Psych: Good eye contact, normal affect. Memory intact not anxious or depressed appearing.  CNS: CN 2-12 intact, power,  normal throughout.no focal deficits noted.   Assessment & Plan  Vertigo FINAL assessment by [facility] cardiology and [facility] has assured pt that the lightheadedness he has been experiencing since Fall 2018 is a vestibular issue and he has been taught and is successfully practicing exercises to relieve his symptom which is significantly reduced. States , he is thankful to hear a cause of his symptoms  Seasonal allergies Uncontrolled , pt to commit to 2 nasal sprays and daily oral med. Refer for immunotherapy  Essential hypertension Controlled, no change in medication DASH diet and commitment to daily physical activity for a minimum of 30 minutes discussed and encouraged, as a part of hypertension management. The importance of attaining a healthy weight is also discussed.  BP/Weight 04/23/2017 02/13/2017 02/12/2017 01/08/2017 12/31/2016 11/26/2016 9/73/5329  Systolic BP 924 268 341 962 229 798 921  Diastolic BP 84 64 90 92 97 84 82  Wt. (Lbs) 216 219 220 230 220 222 224.5  BMI 30.13 30.54 30.68 32.08 30.68 30.96 31.31  Some encounter information is confidential and restricted. Go to Review Flowsheets activity to see all data.

## 2017-04-25 NOTE — Assessment & Plan Note (Signed)
Controlled, no change in medication DASH diet and commitment to daily physical activity for a minimum of 30 minutes discussed and encouraged, as a part of hypertension management. The importance of attaining a healthy weight is also discussed.  BP/Weight 04/23/2017 02/13/2017 02/12/2017 01/08/2017 12/31/2016 11/26/2016 3/35/4562  Systolic BP 563 893 734 287 681 157 262  Diastolic BP 84 64 90 92 97 84 82  Wt. (Lbs) 216 219 220 230 220 222 224.5  BMI 30.13 30.54 30.68 32.08 30.68 30.96 31.31  Some encounter information is confidential and restricted. Go to Review Flowsheets activity to see all data.

## 2017-04-25 NOTE — Assessment & Plan Note (Signed)
FINAL assessment by [facility] cardiology and [place] [facility] has assured pt that the lightheadedness he has been experiencing since Fall 2018 is a vestibular issue and he has been taught and is successfully practicing exercises to relieve his symptom which is significantly reduced. States , he is thankful to hear a cause of his symptoms

## 2017-04-25 NOTE — Assessment & Plan Note (Signed)
Uncontrolled , pt to commit to 2 nasal sprays and daily oral med. Refer for immunotherapy

## 2017-04-27 DIAGNOSIS — R42 Dizziness and giddiness: Secondary | ICD-10-CM | POA: Diagnosis not present

## 2017-06-14 ENCOUNTER — Other Ambulatory Visit: Payer: Self-pay | Admitting: Family Medicine

## 2017-06-14 DIAGNOSIS — I10 Essential (primary) hypertension: Secondary | ICD-10-CM

## 2017-06-16 ENCOUNTER — Ambulatory Visit: Payer: Self-pay | Admitting: Family Medicine

## 2017-06-17 NOTE — Progress Notes (Signed)
Cardiology Office Note  Date: 06/18/2017   ID: Evan Jacobson, DOB 1965/04/02, MRN [id number]  PCP: Fayrene Helper, MD  Primary Cardiologist: Rozann Lesches, MD   Chief Complaint  Patient presents with  . Follow-up palpitations    History of Present Illness: Evan Jacobson is a 52 y.o. male last seen by Ms. Strader PA-C in January.  He presents for a routine scheduled visit for follow-up on lightheadedness and previous sense of palpitations.  He has since sought second opinions and additional evaluation through the [facility] system including ENT, neurology, and cardiology.  He states that vertigo positional exercises have been somewhat helpful but he still has intermittent symptoms.  He has not had any chest pain or syncope.  Event monitor results from January are outlined below.  No significant arrhythmias were identified.  He tells me that he is scheduled to undergo a tilt table study at [facility] this week.  Past Medical History:  Diagnosis Date  . Allergic rhinitis   . Bipolar disorder ([place]) 08/25/2011  . Chronic back pain   . Concussion 2012  . GERD (gastroesophageal reflux disease)   . Hypertension   . Post-concussion syndrome 08/15/2010  . PTSD (post-traumatic stress disorder)     History reviewed. No pertinent surgical history.  Current Outpatient Medications  Medication Sig Dispense Refill  . amLODipine (NORVASC) 2.5 MG tablet TAKE 3 TABLETS (7.5 MG TOTAL) BY MOUTH DAILY. 270 tablet 0  . aspirin 81 MG tablet Take 1 tablet (81 mg total) by mouth daily. 100 tablet 3  . azelastine (ASTELIN) 0.1 % nasal spray Place 2 sprays into both nostrils 2 (two) times daily. Use in each nostril as directed 30 mL 12  . fluticasone (FLONASE) 50 MCG/ACT nasal spray Place 2 sprays into both nostrils daily as needed for allergies or rhinitis.    . fluticasone (FLONASE) 50 MCG/ACT nasal spray Place 2 sprays into both nostrils daily. 16 g 6  . HYDROcodone-acetaminophen (NORCO) 10-325 MG  tablet Take 1 tablet by mouth 2 (two) times daily as needed. 60 tablet 0  . HYDROcodone-acetaminophen (NORCO) 10-325 MG tablet One tablet twice daily 60 tablet 0  . HYDROcodone-acetaminophen (NORCO) 10-325 MG tablet One tablet twice daily 60 tablet 0  . KRILL OIL PO Take 1 capsule by mouth daily.    . Multiple Vitamin (MULTIVITAMIN WITH MINERALS) TABS tablet Take 1 tablet by mouth daily.    Marland Kitchen scopolamine (TRANSDERM-SCOP, 1.5 MG,) 1 MG/3DAYS Place 1 patch (1.5 mg total) onto the skin every 3 (three) days. 5 patch 0   No current facility-administered medications for this visit.    Allergies:  Ace inhibitors and Sulfonamide derivatives   Social History: The patient  reports that he has never smoked. He has never used smokeless tobacco. He reports that he drinks alcohol. He reports that he does not use drugs.   ROS:  Please see the history of present illness. Otherwise, complete review of systems is positive for none.  All other systems are reviewed and negative.   Physical Exam: VS:  BP 132/90   Pulse 85   Ht 5\' 11"  (1.803 m)   Wt 223 lb (101.2 kg)   SpO2 98%   BMI 31.10 kg/m , BMI Body mass index is 31.1 kg/m.  Wt Readings from Last 3 Encounters:  06/18/17 223 lb (101.2 kg)  04/23/17 216 lb (98 kg)  02/13/17 219 lb (99.3 kg)    General: Patient appears comfortable at rest. HEENT: Conjunctiva and lids  normal, oropharynx clear. Neck: Supple, no elevated JVP or carotid bruits, no thyromegaly. Lungs: Clear to auscultation, nonlabored breathing at rest. Cardiac: Regular rate and rhythm, no S3 or significant systolic murmur, no pericardial rub. Abdomen: Soft, nontender, bowel sounds present. Extremities: No pitting edema, distal pulses 2+. Skin: Warm and dry. Musculoskeletal: No kyphosis. Neuropsychiatric: Alert and oriented x3, affect grossly appropriate.  ECG: I personally reviewed the tracing from 02/13/2017 which showed sinus rhythm with nonspecific ST changes.   Recent  Labwork: 01/26/2017: ALT 25; AST 20; BUN 16; Creat 1.22; Hemoglobin 15.0; Platelets 217; Potassium 4.1; Sodium 138; TSH 1.23     Component Value Date/Time   CHOL 185 01/26/2017 0918   TRIG 98 01/26/2017 0918   HDL 49 01/26/2017 0918   CHOLHDL 3.8 01/26/2017 0918   VLDL 14 06/18/2016 0936   LDLCALC 116 (H) 01/26/2017 0918    Other Studies Reviewed Today:  30-day event recorder 02/16/2017: Available strips from 30-day event recorder reviewed.  Sinus rhythm, sinus arrhythmia, and sinus tachycardia noted without clear correlation of specific heart rate to symptoms.  No obvious sustained arrhythmias or pauses.  Carotid Dopplers 02/04/2017: IMPRESSION: 1. No significant carotid plaque or stenosis. 2.  Antegrade bilateral vertebral arterial flow.  Exercise echocardiogram 07/01/2016: Study Conclusions  - Stress ECG conclusions: The stress ECG was negative for ischemia.   [facility] scoring: exercise time of 10 min; maximum ST deviation of 0   mm; no angina; resulting score is 10. This score predicts a low   risk of cardiac events. - Staged echo: There was no diagnostic evidence for stress-induced   ischemia.  Assessment and Plan:  1.  History of intermittent lightheadedness and palpitations.  He has not had any definitive arrhythmia to clearly explain the symptoms and is undergoing further evaluation through the [facility] system at this point.  Monitor obtained earlier this year did not demonstrate any significant arrhythmias or pauses.  He underwent a reassuring exercise echocardiogram in June of last year.  There were no inducible arrhythmias.  No further cardiac testing is planned at this time.  We can see him back as needed.  2.  Essential hypertension, continues on Norvasc with follow-up per Dr. Moshe Cipro.  Current medicines were reviewed with the patient today.  Disposition: Follow-up as needed.  Signed, Satira Sark, MD, [facility] 06/18/2017 9:09 AM    [facility]  at [facility] [address] [place], [place] [address] Phone: [phone]; Fax: [phone]

## 2017-06-18 ENCOUNTER — Telehealth: Payer: Self-pay | Admitting: Family Medicine

## 2017-06-18 ENCOUNTER — Encounter: Payer: Self-pay | Admitting: Cardiology

## 2017-06-18 ENCOUNTER — Ambulatory Visit: Payer: 59 | Admitting: Cardiology

## 2017-06-18 VITALS — BP 132/90 | HR 85 | Ht 71.0 in | Wt 223.0 lb

## 2017-06-18 DIAGNOSIS — R42 Dizziness and giddiness: Secondary | ICD-10-CM | POA: Diagnosis not present

## 2017-06-18 DIAGNOSIS — I10 Essential (primary) hypertension: Secondary | ICD-10-CM

## 2017-06-18 NOTE — Patient Instructions (Addendum)
Your physician wants you to follow-up in: as needed with [place]      Your physician recommends that you continue on your current medications as directed. Please refer to the Current Medication list given to you today.      No lab work or tests ordered today.      Thank you for choosing [facility] !

## 2017-06-18 NOTE — Telephone Encounter (Signed)
Please call regarding Pain Medicine, he missed his appt the other day --

## 2017-06-19 NOTE — Telephone Encounter (Signed)
Cannot be called in since he missed his appt. Must reschedule and keep that appt

## 2017-06-23 NOTE — Telephone Encounter (Signed)
Pt is not happy-I put him on the cxl list

## 2017-06-29 ENCOUNTER — Telehealth (INDEPENDENT_AMBULATORY_CARE_PROVIDER_SITE_OTHER): Payer: Self-pay | Admitting: *Deleted

## 2017-06-29 ENCOUNTER — Encounter (INDEPENDENT_AMBULATORY_CARE_PROVIDER_SITE_OTHER): Payer: Self-pay | Admitting: *Deleted

## 2017-06-29 MED ORDER — PEG 3350-KCL-NA BICARB-NACL 420 G PO SOLR: 4000.0000 mL | Freq: Once | ORAL | 0 refills | Status: AC

## 2017-06-29 NOTE — Telephone Encounter (Signed)
Patient needs trilyte 

## 2017-06-30 ENCOUNTER — Ambulatory Visit: Payer: 59 | Admitting: Allergy & Immunology

## 2017-06-30 ENCOUNTER — Encounter: Payer: Self-pay | Admitting: Allergy & Immunology

## 2017-06-30 VITALS — BP 138/78 | HR 97 | Temp 98.2°F | Resp 17 | Ht 71.0 in | Wt 222.0 lb

## 2017-06-30 DIAGNOSIS — J302 Other seasonal allergic rhinitis: Secondary | ICD-10-CM

## 2017-06-30 DIAGNOSIS — J3089 Other allergic rhinitis: Secondary | ICD-10-CM | POA: Diagnosis not present

## 2017-06-30 MED ORDER — EPINEPHRINE 0.3 MG/0.3ML IJ SOAJ: 0.3000 mg | Freq: Once | INTRAMUSCULAR | 2 refills | Status: AC

## 2017-06-30 NOTE — Patient Instructions (Addendum)
1. Seasonal and perennial allergic rhinitis - Testing today showed: trees, weeds, grasses, indoor molds, outdoor molds, dust mites, cat and dog - Avoidance measures provided. - Continue with: Xyzal (levocetirizine) 5mg  tablet once daily, Flonase (fluticasone) two sprays per nostril daily and Astelin (azelastine) 2 sprays per nostril 1-2 times daily as needed - You can use an extra dose of the antihistamine, if needed, for breakthrough symptoms.  - Consider nasal saline rinses 1-2 times daily to remove allergens from the nasal cavities as well as help with mucous clearance (this is especially helpful to do before the nasal sprays are given) - Allergy shot consent signed. - EpiPen training provided. - Come back in two weeks for your first injection.   2. Return in about 6 months (around 12/30/2017).   Please inform us of any Emergency Department visits, hospitalizations, or changes in symptoms. Call us before going to the ED for breathing or allergy symptoms since we might be able to fit you in for a sick visit. Feel free to contact us anytime with any questions, problems, or concerns.  It was a pleasure to meet you today!  Websites that have reliable patient information: 1. American Academy of Asthma, Allergy, and Immunology: www.aaaai.org 2. Food Allergy Research and Education (FARE): foodallergy.org 3. Mothers of Asthmatics: http://www.asthmacommunitynetwork.org 4. American College of Allergy, Asthma, and Immunology: MonthlyElectricBill.co.uk   Make sure you are registered to vote!    Reducing Pollen Exposure  The American Academy of Allergy, Asthma and Immunology suggests the following steps to reduce your exposure to pollen during allergy seasons.    1. Do not hang sheets or clothing out to dry; pollen may collect on these items. 2. Do not mow lawns or spend time around freshly cut grass; mowing stirs up pollen. 3. Keep windows closed at night.  Keep car windows closed while  driving. 4. Minimize morning activities outdoors, a time when pollen counts are usually at their highest. 5. Stay indoors as much as possible when pollen counts or humidity is high and on windy days when pollen tends to remain in the air longer. 6. Use air conditioning when possible.  Many air conditioners have filters that trap the pollen spores. 7. Use a HEPA room air filter to remove pollen form the indoor air you breathe.  Control of Mold Allergen   Mold and fungi can grow on a variety of surfaces provided certain temperature and moisture conditions exist.  Outdoor molds grow on plants, decaying vegetation and soil.  The major outdoor mold, Alternaria and Cladosporium, are found in very high numbers during hot and dry conditions.  Generally, a late Summer - Fall peak is seen for common outdoor fungal spores.  Rain will temporarily lower outdoor mold spore count, but counts rise rapidly when the rainy period ends.  The most important indoor molds are Aspergillus and Penicillium.  Dark, humid and poorly ventilated basements are ideal sites for mold growth.  The next most common sites of mold growth are the bathroom and the kitchen.  Outdoor (Seasonal) Mold Control  Positive outdoor molds via skin testing: Drechslera (Curvalaria)  1. Use air conditioning and keep windows closed 2. Avoid exposure to decaying vegetation. 3. Avoid leaf raking. 4. Avoid grain handling. 5. Consider wearing a face mask if working in moldy areas.  6.   Indoor (Perennial) Mold Control   Positive indoor molds via skin testing: Aspergillus, Penicillium, Fusarium, Aureobasidium (Pullulara), Rhizopus and Phoma  1. Maintain humidity below 50%. 2. Clean washable surfaces with  5% bleach solution. 3. Remove sources e.g. contaminated carpets.     Control of Dog or Cat Allergen  Avoidance is the best way to manage a dog or cat allergy. If you have a dog or cat and are allergic to dog or cats, consider removing the  dog or cat from the home. If you have a dog or cat but don't want to find it a new home, or if your family wants a pet even though someone in the household is allergic, here are some strategies that may help keep symptoms at bay:  1. Keep the pet out of your bedroom and restrict it to only a few rooms. Be advised that keeping the dog or cat in only one room will not limit the allergens to that room. 2. Don't pet, hug or kiss the dog or cat; if you do, wash your hands with soap and water. 3. High-efficiency particulate air (HEPA) cleaners run continuously in a bedroom or living room can reduce allergen levels over time. 4. Regular use of a high-efficiency vacuum cleaner or a central vacuum can reduce allergen levels. 5. Giving your dog or cat a bath at least once a week can reduce airborne allergen.  Control of House Dust Mite Allergen    House dust mites play a major role in allergic asthma and rhinitis.  They occur in environments with high humidity wherever human skin, the food for dust mites is found. High levels have been detected in dust obtained from mattresses, pillows, carpets, upholstered furniture, bed covers, clothes and soft toys.  The principal allergen of the house dust mite is found in its feces.  A gram of dust may contain 1,000 mites and 250,000 fecal particles.  Mite antigen is easily measured in the air during house cleaning activities.    1. Encase mattresses, including the box spring, and pillow, in an air tight cover.  Seal the zipper end of the encased mattresses with wide adhesive tape. 2. Wash the bedding in water of 130 degrees Farenheit weekly.  Avoid cotton comforters/quilts and flannel bedding: the most ideal bed covering is the dacron comforter. 3. Remove all upholstered furniture from the bedroom. 4. Remove carpets, carpet padding, rugs, and non-washable window drapes from the bedroom.  Wash drapes weekly or use plastic window coverings. 5. Remove all non-washable  stuffed toys from the bedroom.  Wash stuffed toys weekly. 6. Have the room cleaned frequently with a vacuum cleaner and a damp dust-mop.  The patient should not be in a room which is being cleaned and should wait 1 hour after cleaning before going into the room. 7. Close and seal all heating outlets in the bedroom.  Otherwise, the room will become filled with dust-laden air.  An electric heater can be used to heat the room. 8. Reduce indoor humidity to less than 50%.  Do not use a humidifier.

## 2017-06-30 NOTE — Progress Notes (Signed)
NEW PATIENT  Date of Service/Encounter:  06/30/17  Referring provider: Fayrene Helper, MD   Assessment:   Seasonal and perennial allergic rhinitis (trees, weeds, grasses, indoor molds, outdoor molds, dust mites, cat and dog) - wishing to initiate allergen immunotherapy  Plan/Recommendations:   1. Seasonal and perennial allergic rhinitis - Testing today showed: trees, weeds, grasses, indoor molds, outdoor molds, dust mites, cat and dog - Avoidance measures provided. - Continue with: Xyzal (levocetirizine) 5mg  tablet once daily, Flonase (fluticasone) two sprays per nostril daily and Astelin (azelastine) 2 sprays per nostril 1-2 times daily as needed - You can use an extra dose of the antihistamine, if needed, for breakthrough symptoms.  - Consider nasal saline rinses 1-2 times daily to remove allergens from the nasal cavities as well as help with mucous clearance (this is especially helpful to do before the nasal sprays are given) - Allergy shot consent signed. - EpiPen training provided since all of our patients on allergen immunotherapy need epinephrine auto-injectors in case of serious reactions. - Come back in two weeks for your first injection.  2. Return in about 6 months (around 12/30/2017).   Subjective:   Evan Jacobson is a 52 y.o. male presenting today for evaluation of  Chief Complaint  Patient presents with  . Allergic Rhinitis     Evan Jacobson has a history of the following: Patient Active Problem List   Diagnosis Date Noted  . Seasonal and perennial allergic rhinitis 06/30/2017  . Encounter for pain management 02/17/2017  . Vertigo 01/08/2017  . Heart palpitations 06/18/2016  . Persistent adjustment disorder 12/05/2015  . Groin pain, chronic, right 06/28/2014  . Overweight (BMI 25.0-29.9) 05/20/2014  . Back pain with left-sided radiculopathy 01/16/2014  . Persistent testicular pain 12/13/2010  . Essential hypertension 06/19/2008  . ERECTILE  DYSFUNCTION 11/07/2007  . Seasonal allergies 08/13/2007  . GERD 08/13/2007    History obtained from: chart review and patient.  Evan Jacobson was referred by Fayrene Helper, MD.     Evan Jacobson is a 52 y.o. male presenting for allergic rhinitis. He reports that he started having allergy symptoms around 20 years ago. This started when he moved to [place] around 20 years ago. He reports mostly nasal symptoms with some itch watery eyes as well. He does have sneezing during the worst times of the year. He typically gets antibiotics around September or October because the symptoms become so bad. But last year he did not get a course of antibiotics. He does use nasal saline rinses fairly regularly.   Currently he takes Xyzal in the evening and two nasal sprays (Flonase and Astelin). He starts taking it February and March and then takes it through October. He goes off of all of the medications in the winter. He takes nothing in the winter.    He did go to [facility] several years ago and was allergic to everything. Evidently he had a marked reaction to the testing. He was on shots for a period of time. He did this for 2-3 years and it did help. But it was 45 minutes three days weekly so he could not keep this up.   He does not have asthma, eczema, or hives. He eats everthing without a problem. Otherwise, there is no history of other atopic diseases, including drug allergies, stinging insect allergies, or urticaria. There is no significant infectious history. Vaccinations are up to date.    Past Medical History: Patient Active Problem List   Diagnosis  Date Noted  . Seasonal and perennial allergic rhinitis 06/30/2017  . Encounter for pain management 02/17/2017  . Vertigo 01/08/2017  . Heart palpitations 06/18/2016  . Persistent adjustment disorder 12/05/2015  . Groin pain, chronic, right 06/28/2014  . Overweight (BMI 25.0-29.9) 05/20/2014  . Back pain with left-sided radiculopathy 01/16/2014  .  Persistent testicular pain 12/13/2010  . Essential hypertension 06/19/2008  . ERECTILE DYSFUNCTION 11/07/2007  . Seasonal allergies 08/13/2007  . GERD 08/13/2007    Medication List:  Allergies as of 06/30/2017      Reactions   Sulfa Antibiotics Anaphylaxis   Ace Inhibitors Other (See Comments)   Feels as though throat is closing up intermittently   Sulfasalazine Other (See Comments)   Tongue swelled.  Tongue swelled.  Tongue swelled.    Sulfonamide Derivatives Other (See Comments)   Tongue swelled.       Medication List        Accurate as of 06/30/17 11:39 AM. Always use your most recent med list.          amLODipine 2.5 MG tablet Commonly known as:  NORVASC TAKE 3 TABLETS (7.5 MG TOTAL) BY MOUTH DAILY.   azelastine 0.1 % nasal spray Commonly known as:  ASTELIN Place 2 sprays into both nostrils 2 (two) times daily. Use in each nostril as directed   EPINEPHrine 0.3 mg/0.3 mL Soaj injection Commonly known as:  EPIPEN 2-PAK Inject 0.3 mLs (0.3 mg total) into the muscle once for 1 dose.   Fish Oil 1000 MG Caps Take by mouth.   fluticasone 50 MCG/ACT nasal spray Commonly known as:  FLONASE Place 2 sprays into both nostrils daily.   HYDROcodone-acetaminophen 10-325 MG tablet Commonly known as:  NORCO Take 1 tablet by mouth 2 (two) times daily as needed.   KRILL OIL PO Take 1 capsule by mouth daily.   levocetirizine 5 MG tablet Commonly known as:  XYZAL Take by mouth.   multivitamin with minerals Tabs tablet Take 1 tablet by mouth daily.       Birth History: non-contributory.  Developmental History: non-contributory.   Past Surgical History: History reviewed. No pertinent surgical history.   Family History:  Family History  Problem Relation Age of Onset  . Stroke Father   . Hypertension Father   . Hypertension Mother        [name]  . Heart disease Mother        Angina  . Hypertension Sister   . Hypertension Sister   . Hypertension Brother   .  Hypertension Brother       Social History: Evan Jacobson lives at home with his wife. He has two grown sons. They are currently working at [employer] and [employer] and [employer] (where his mother also works). He is a disabled Gaffer. He was in the [employer] for 13 years. He got disability in 2013. He is 100% service connected.  He lives in a home with carpeting throughout the home. There is electric heating and central cooling. There are no animals inside or outside of the home. There is not tobacco exposure in the home. There are dust mite coverings on the bedding.     Review of Systems: a 14-point review of systems is pertinent for what is mentioned in HPI.  Otherwise, all other systems were negative. Constitutional: negative other than that listed in the HPI Eyes: negative other than that listed in the HPI Ears, nose, mouth, throat, and face: negative other than that listed in the HPI Respiratory: negative  other than that listed in the HPI Cardiovascular: negative other than that listed in the HPI Gastrointestinal: negative other than that listed in the HPI Genitourinary: negative other than that listed in the HPI Integument: negative other than that listed in the HPI Hematologic: negative other than that listed in the HPI Musculoskeletal: negative other than that listed in the HPI Neurological: negative other than that listed in the HPI Allergy/Immunologic: negative other than that listed in the HPI    Objective:   Blood pressure 138/78, pulse 97, temperature 98.2 F (36.8 C), resp. rate 17, height 5\' 11"  (1.803 m), weight 222 lb (100.7 kg), SpO2 98 %. Body mass index is 30.96 kg/m.   Physical Exam:  General: Alert, interactive, in no acute distress. Pleasant male.  Eyes: No conjunctival injection bilaterally, no discharge on the right, no discharge on the left and no Horner-Trantas dots present. PERRL bilaterally. EOMI without pain. No photophobia.  Ears: Right TM pearly gray with  normal light reflex, Left TM pearly gray with normal light reflex, Right TM intact without perforation and Left TM intact without perforation.  Nose/Throat: External nose within normal limits, nasal crease present and septum midline. Turbinates markedly edematous and pale with clear discharge. Posterior oropharynx erythematous with cobblestoning in the posterior oropharynx. Tonsils 2+ without exudates.  Tongue without thrush. Neck: Supple without thyromegaly. Trachea midline. Adenopathy: no enlarged lymph nodes appreciated in the anterior cervical, occipital, axillary, epitrochlear, inguinal, or popliteal regions. Lungs: Clear to auscultation without wheezing, rhonchi or rales. No increased work of breathing. CV: Normal S1/S2. No murmurs. Capillary refill <2 seconds.  Abdomen: Nondistended, nontender. No guarding or rebound tenderness. Bowel sounds present in all fields and hypoactive  Skin: Warm and dry, without lesions or rashes. Extremities:  No clubbing, cyanosis or edema. Neuro:   Grossly intact. No focal deficits appreciated. Responsive to questions.  Diagnostic studies:   Allergy Studies: none  Indoor/Outdoor Percutaneous Adult Environmental Panel: positive to bahia grass, [place] grass, johnson grass, [place] blue grass, meadow fescue grass, perennial rye grass, sweet vernal grass, timothy grass, cocklebur, burweed marsh elder, short ragweed, giant ragweed, English plantain, lamb's quarters, rough pigweed, rough marsh elder, common mugwort, ash, birch, American beech, Box elder, red cedar, eastern cottonwood, elm, hickory, maple, oak, pecan pollen, pine, [place] sycamore, black walnut pollen, Drechslera and Phoma. Otherwise negative with adequate controls.  Indoor/Outdoor Selected Intradermal Environmental Panel: positive to mold mix #2, mold mix #4, cat, dog and mite mix. Otherwise negative with adequate controls.  Allergy testing results were read and interpreted by myself, documented  by clinical staff.       Salvatore Marvel, MD [facility]

## 2017-07-01 DIAGNOSIS — J3089 Other allergic rhinitis: Secondary | ICD-10-CM | POA: Diagnosis not present

## 2017-07-01 NOTE — Progress Notes (Signed)
VIALS EXP 07-02-18

## 2017-07-02 DIAGNOSIS — J302 Other seasonal allergic rhinitis: Secondary | ICD-10-CM | POA: Diagnosis not present

## 2017-07-03 NOTE — Progress Notes (Signed)
Received notes from [facility]. His last testing there was performed in 2008 and was positive to trees, grasses, molds, dust mites, dog, cats, and cockroach. He also had multiple sick visits. Outside records will be scanned into the system.   Salvatore Marvel, MD [facility]

## 2017-07-14 ENCOUNTER — Ambulatory Visit (INDEPENDENT_AMBULATORY_CARE_PROVIDER_SITE_OTHER): Payer: 59

## 2017-07-14 DIAGNOSIS — J309 Allergic rhinitis, unspecified: Secondary | ICD-10-CM | POA: Diagnosis not present

## 2017-07-14 DIAGNOSIS — R42 Dizziness and giddiness: Secondary | ICD-10-CM | POA: Diagnosis not present

## 2017-07-15 ENCOUNTER — Telehealth (INDEPENDENT_AMBULATORY_CARE_PROVIDER_SITE_OTHER): Payer: Self-pay | Admitting: *Deleted

## 2017-07-15 NOTE — Telephone Encounter (Signed)
Referring MD/PCP: simpson   Procedure: tcs  Reason/Indication:  screening  Has patient had this procedure before?  Yes, 2008  If so, when, by whom and where?    Is there a family history of colon cancer?  no  Who?  What age when diagnosed?    Is patient diabetic?   no      Does patient have prosthetic heart valve or mechanical valve?  no  Do you have a pacemaker?  no  Has patient ever had endocarditis? no  Has patient had joint replacement within last 12 months?  no  Is patient constipated or do they take laxatives? yes  Does patient have a history of alcohol/drug use?  no  Is patient on blood thinner such as Coumadin, Plavix and/or Aspirin? no  Medications: norvasc 2.5 mg daily, krill oil daily, MVI daily, allegra daily, asa 81 mg daily  Allergies: ace inhibitors, sulfur  Medication Adjustment per Dr Lindi Adie, NP: asa 2 days  Procedure date & time: 08/12/17 at 730

## 2017-07-15 NOTE — Telephone Encounter (Signed)
agree

## 2017-07-23 ENCOUNTER — Encounter: Payer: Self-pay | Admitting: Family Medicine

## 2017-07-23 ENCOUNTER — Ambulatory Visit (INDEPENDENT_AMBULATORY_CARE_PROVIDER_SITE_OTHER): Payer: 59 | Admitting: Family Medicine

## 2017-07-23 VITALS — BP 130/84 | HR 81 | Resp 16 | Ht 71.0 in | Wt 223.0 lb

## 2017-07-23 DIAGNOSIS — F321 Major depressive disorder, single episode, moderate: Secondary | ICD-10-CM

## 2017-07-23 DIAGNOSIS — I10 Essential (primary) hypertension: Secondary | ICD-10-CM | POA: Diagnosis not present

## 2017-07-23 DIAGNOSIS — R52 Pain, unspecified: Secondary | ICD-10-CM

## 2017-07-23 DIAGNOSIS — E669 Obesity, unspecified: Secondary | ICD-10-CM | POA: Diagnosis not present

## 2017-07-23 MED ORDER — HYDROCODONE-ACETAMINOPHEN 10-325 MG PO TABS: ORAL_TABLET | ORAL | 0 refills | Status: DC

## 2017-07-23 NOTE — Progress Notes (Signed)
Fill Date ID Written Drug Qty Days Prescriber Rx # Pharmacy Refill Daily Dose * [place] Type PMP  05/20/2017  1  02/12/2017  Hydrocodone-Acetamin 10-325 Mg  60 30 Ma Sim  [id number]  [place] ([id number])  0 20.00 MME Comm Ins  [place]  04/17/2017  1  02/12/2017  Hydrocodone-Acetamin 10-325 Mg  60 30 Ma Sim  [id number]  Nor ([id number])  0 20.00 MME Comm Ins  [place]  03/06/2017  1  02/12/2017  Hydrocodone-Acetamin 10-325 Mg  60 30 Ma Sim  [id number]  Nor ([id number])  0 20.00 MME Comm Ins  [place]  01/30/2017  1  11/26/2016  Hydrocodone-Acetamin 10-325 Mg  60 30 Ma Sim  [id number]  Nor ([id number])  0 20.00 MME Comm Ins  [place]  12/24/2016  3  11/26/2016  Hydrocodone-Acetamin 10-325 Mg  60 30 Ma Sim  [id number]  Wal ([id number])  0 20.00 MME Comm Ins  [place]  11/26/2016  1  11/26/2016  Hydrocodone-Acetamin 10-325 Mg  60 30 Ma Sim  [id number]  Nor ([id number])  0 20.00 MME Comm Ins  [place]  10/17/2016  1  08/19/2016  Hydrocodone-Acetamin 10-325 Mg  60 30 Ma Sim  [id number]  Nor ([id number])  0 20.00 MME Comm Ins  [place]  09/17/2016  1  09/17/2016  Hydrocodone-Acetamin 10-325 Mg  60 30 Ma Sim  [id number]  [place] ([id number])  0 20.00 MME Comm Ins    08/19/2016  1  08/19/2016  Hydrocodone-Acetamin 10-325 Mg  60 30 Ma Sim  [id number]  [place] ([id number])  0 20.00 MME Comm Ins    06/06/2016  1  06/06/2016  Hydrocodone-Acetamin 10-325 Mg

## 2017-07-23 NOTE — Patient Instructions (Addendum)
F/U in 12 weeks call if you need me sooner  Please work in the changes we discussed  Please work on good  health habits so that your health will improve. 1. Commitment to daily physical activity for 30 to 60  minutes, if you are able to do this.  2. Commitment to wise food choices. Aim for half of your  food intake to be vegetable and fruit, one quarter starchy foods, and one quarter protein. Try to eat on a regular schedule  3 meals per day, snacking between meals should be limited to vegetables or fruits or small portions of nuts. 64 ounces of water per day is generally recommended, unless you have specific health conditions, like heart failure or kidney failure where you will need to limit fluid intake.  3. Commitment to sufficient and a  good quality of physical and mental rest daily, generally between 6 to 8 hours per day.  WITH PERSISTANCE AND PERSEVERANCE, THE IMPOSSIBLE , BECOMES THE NORM! It is important that you exercise regularly at least 30 minutes 5 times a week. If you develop chest pain, have severe difficulty breathing, or feel very tired, stop exercising immediately and seek medical attention

## 2017-07-26 ENCOUNTER — Encounter: Payer: Self-pay | Admitting: Family Medicine

## 2017-07-26 DIAGNOSIS — F321 Major depressive disorder, single episode, moderate: Secondary | ICD-10-CM | POA: Insufficient documentation

## 2017-07-26 DIAGNOSIS — F324 Major depressive disorder, single episode, in partial remission: Secondary | ICD-10-CM | POA: Insufficient documentation

## 2017-07-26 MED ORDER — HYDROCODONE-ACETAMINOPHEN 10-325 MG PO TABS: ORAL_TABLET | ORAL | 0 refills | Status: DC

## 2017-07-26 NOTE — Progress Notes (Signed)
Evan Jacobson     MRN: [id number]      DOB: April 10, 1965   HPI Evan Jacobson is here for follow up and re-evaluation of chronic medical conditions, medication management and in particular his chronic pain management. Unfortunately, he has been non compliant, missing his May appointment. States he ahs been severely depressed and has started drinking alcohol again.This he states was triggered when he found out that the dentist who had worked on his teeth last Fall who had taken care of him for years, was under review by the medical board for substance abuse and had her license partially suspended.during the time she had been under his care and he felt that he had been wrongfully treated and by her office when he complained of his situation He started again verbalizing recurrence of his symptoms of lightheadedness and vertigo, which he has spent over 7 months, seeing both neurology, cardiology and ENT about , to be cleared by all 3. I advised that he should focus on good health habits specifically cessation of drinking alcohol, focusing on his mental health in a positive way and adhering to his mental heath treatment and his meds as prescribed. I did validate his frustration with his experience with the dental experience and encouraged him to use te resources he has at this disposal as in access to  Therapy to ventilate He is treated at the [facility] for  PTSD and depression and has the positive  support of his wife  As in the past when he is depressed , he has actually gained 13 pounds in the past 6 months, he is not exercising and his eating and  Drinking habits have fallen off. He is now motivated to change all of his and realizes that he must. Is back pain is uncontrolled and he needs to be re established on his chronic pain medications with the clear understanding that he stays on schedule and that he keeps with the mental health program and he abstains from alcohol ROS See HPI  Denies recent fever or  chills. Denies sinus pressure, nasal congestion, ear pain or sore throat. Denies chest congestion, productive cough or wheezing. Denies chest pains, palpitations and leg swelling Denies abdominal pain, nausea, vomiting,diarrhea or constipation.   Denies dysuria, frequency, hesitancy or incontinence.  Denies skin break down or rash.   PE  BP 130/84   Pulse 81   Resp 16   Ht 5\' 11"  (1.803 m)   Wt 223 lb (101.2 kg)   SpO2 98%   BMI 31.10 kg/m   Patient alert and oriented and in no cardiopulmonary distress.  HEENT: No facial asymmetry, EOMI,   oropharynx pink and moist.  Neck supple no JVD, no mass.  Chest: Clear to auscultation bilaterally.  CVS: S1, S2 no murmurs, no S3.Regular rate.  ABD: Soft non tender.   Ext: No edema  MS: Decreased  ROM spine,adequate in  shoulders, hips and knees.  Skin: Intact, no ulcerations or rash noted.  Psych: Good eye contact, flat affect. Memory intact not anxious but  depressed appearing.  CNS: CN 2-12 intact, power,  normal throughout.no focal deficits noted.   Assessment & Plan  Encounter for pain management The patient's Controlled Substance registry is reviewed and compliance confirmed. Adequacy of  Pain control and level of function is assessed. Medication dosing is adjusted as deemed appropriate. Twelve weeks of medication is prescribed , patient signs for the script and is provided with a follow up appointment between 11 to  12 weeks .   Depression, major, single episode, moderate (HCC) Uncontrolled, not suicidal or homicidal, managed at the [facility] hypertension Controlled, no change in medication DASH diet and commitment to daily physical activity for a minimum of 30 minutes discussed and encouraged, as a part of hypertension management. The importance of attaining a healthy weight is also discussed.  BP/Weight 07/23/2017 06/30/2017 06/18/2017 04/23/2017 02/13/2017 02/12/2017 16/10/9602  Systolic BP 540 981 191 478 124  295 621  Diastolic BP 84 78 90 84 64 90 92  Wt. (Lbs) 223 222 223 216 219 220 230  BMI 31.1 30.96 31.1 30.13 30.54 30.68 32.08  Some encounter information is confidential and restricted. Go to Review Flowsheets activity to see all data.       Obesity (BMI 30.0-34.9) Deteriorated. Patient re-educated about  the importance of commitment to a  minimum of 150 minutes of exercise per week.  The importance of healthy food choices with portion control discussed. Encouraged to start a food diary, count calories and to consider  joining a support group. Sample diet sheets offered. Goals set by the patient for the next several months.   Weight /BMI 07/23/2017 06/30/2017 06/18/2017  WEIGHT 223 lb 222 lb 223 lb  HEIGHT 5\' 11"  5\' 11"  5\' 11"   BMI 31.1 kg/m2 30.96 kg/m2 31.1 kg/m2  Some encounter information is confidential and restricted. Go to Review Flowsheets activity to see all data.

## 2017-07-26 NOTE — Assessment & Plan Note (Signed)
Controlled, no change in medication DASH diet and commitment to daily physical activity for a minimum of 30 minutes discussed and encouraged, as a part of hypertension management. The importance of attaining a healthy weight is also discussed.  BP/Weight 07/23/2017 06/30/2017 06/18/2017 04/23/2017 02/13/2017 02/12/2017 76/73/4193  Systolic BP 790 240 973 532 992 426 834  Diastolic BP 84 78 90 84 64 90 92  Wt. (Lbs) 223 222 223 216 219 220 230  BMI 31.1 30.96 31.1 30.13 30.54 30.68 32.08  Some encounter information is confidential and restricted. Go to Review Flowsheets activity to see all data.

## 2017-07-26 NOTE — Assessment & Plan Note (Signed)
The patient's Controlled Substance registry is reviewed and compliance confirmed. Adequacy of  Pain control and level of function is assessed. Medication dosing is adjusted as deemed appropriate. Twelve weeks of medication is prescribed , patient signs for the script and is provided with a follow up appointment between 11 to 12 weeks .  

## 2017-07-26 NOTE — Assessment & Plan Note (Signed)
Deteriorated. Patient re-educated about  the importance of commitment to a  minimum of 150 minutes of exercise per week.  The importance of healthy food choices with portion control discussed. Encouraged to start a food diary, count calories and to consider  joining a support group. Sample diet sheets offered. Goals set by the patient for the next several months.   Weight /BMI 07/23/2017 06/30/2017 06/18/2017  WEIGHT 223 lb 222 lb 223 lb  HEIGHT 5\' 11"  5\' 11"  5\' 11"   BMI 31.1 kg/m2 30.96 kg/m2 31.1 kg/m2  Some encounter information is confidential and restricted. Go to Review Flowsheets activity to see all data.

## 2017-07-26 NOTE — Assessment & Plan Note (Signed)
Uncontrolled, not suicidal or homicidal, managed at the [place]

## 2017-08-12 ENCOUNTER — Other Ambulatory Visit: Payer: Self-pay

## 2017-08-12 ENCOUNTER — Encounter (HOSPITAL_COMMUNITY): Payer: Self-pay | Admitting: *Deleted

## 2017-08-12 ENCOUNTER — Ambulatory Visit (HOSPITAL_COMMUNITY)
Admission: RE | Admit: 2017-08-12 | Discharge: 2017-08-12 | Disposition: A | Discharge status: Home / Self Care | Payer: 59 | Source: Ambulatory Visit | Attending: Internal Medicine | Admitting: Internal Medicine

## 2017-08-12 ENCOUNTER — Encounter (HOSPITAL_COMMUNITY)
Admission: RE | Disposition: A | Discharge status: Home / Self Care | Payer: Self-pay | Source: Ambulatory Visit | Attending: Internal Medicine

## 2017-08-12 DIAGNOSIS — F319 Bipolar disorder, unspecified: Secondary | ICD-10-CM | POA: Insufficient documentation

## 2017-08-12 DIAGNOSIS — I10 Essential (primary) hypertension: Secondary | ICD-10-CM | POA: Insufficient documentation

## 2017-08-12 DIAGNOSIS — Z882 Allergy status to sulfonamides status: Secondary | ICD-10-CM | POA: Insufficient documentation

## 2017-08-12 DIAGNOSIS — F4310 Post-traumatic stress disorder, unspecified: Secondary | ICD-10-CM | POA: Insufficient documentation

## 2017-08-12 DIAGNOSIS — Z888 Allergy status to other drugs, medicaments and biological substances status: Secondary | ICD-10-CM | POA: Insufficient documentation

## 2017-08-12 DIAGNOSIS — Z1211 Encounter for screening for malignant neoplasm of colon: Secondary | ICD-10-CM | POA: Diagnosis not present

## 2017-08-12 DIAGNOSIS — D125 Benign neoplasm of sigmoid colon: Secondary | ICD-10-CM | POA: Diagnosis not present

## 2017-08-12 DIAGNOSIS — Z79899 Other long term (current) drug therapy: Secondary | ICD-10-CM | POA: Diagnosis not present

## 2017-08-12 DIAGNOSIS — K219 Gastro-esophageal reflux disease without esophagitis: Secondary | ICD-10-CM | POA: Insufficient documentation

## 2017-08-12 HISTORY — PX: COLONOSCOPY: SHX5424

## 2017-08-12 SURGERY — COLONOSCOPY
Anesthesia: Moderate Sedation

## 2017-08-12 MED ORDER — MIDAZOLAM HCL 5 MG/5ML IJ SOLN
INTRAMUSCULAR | Status: DC | PRN
  Administered 2017-08-12: 3 mg via INTRAVENOUS
  Administered 2017-08-12: 1 mg via INTRAVENOUS
  Administered 2017-08-12 (×3): 2 mg via INTRAVENOUS

## 2017-08-12 MED ORDER — MEPERIDINE HCL 50 MG/ML IJ SOLN
INTRAMUSCULAR | Status: DC | PRN
  Administered 2017-08-12 (×2): 25 mg via INTRAVENOUS

## 2017-08-12 MED ORDER — MIDAZOLAM HCL 5 MG/5ML IJ SOLN
INTRAMUSCULAR | Status: AC
  Filled 2017-08-12: qty 10

## 2017-08-12 MED ORDER — MEPERIDINE HCL 50 MG/ML IJ SOLN
INTRAMUSCULAR | Status: AC
  Filled 2017-08-12: qty 1

## 2017-08-12 MED ORDER — SODIUM CHLORIDE 0.9 % IV SOLN
INTRAVENOUS | Status: DC
  Administered 2017-08-12: 07:00:00 via INTRAVENOUS

## 2017-08-12 NOTE — Op Note (Signed)
[facility] Patient Name: Evan Jacobson Procedure Date: 08/12/2017 7:27 AM MRN: [id number] Date of Birth: 1965-10-31 Attending MD: Hildred Laser , MD CSN: [id number] Age: 52 Admit Type: Outpatient Procedure:                Colonoscopy Indications:              Screening for colorectal malignant neoplasm Providers:                Hildred Laser, MD, Lurline Del, RN, Nelma Rothman,                            Technician Referring MD:             Norwood Levo. Moshe Cipro, MD Medicines:                Meperidine 50 mg IV, Midazolam 10 mg IV Complications:            No immediate complications. Estimated Blood Loss:     Estimated blood loss was minimal. Procedure:                Pre-Anesthesia Assessment:                           - Prior to the procedure, a History and Physical                            was performed, and patient medications and                            allergies were reviewed. The patient's tolerance of                            previous anesthesia was also reviewed. The risks                            and benefits of the procedure and the sedation                            options and risks were discussed with the patient.                            All questions were answered, and informed consent                            was obtained. Prior Anticoagulants: The patient has                            taken no previous anticoagulant or antiplatelet                            agents. ASA Grade Assessment: II - A patient with                            mild systemic disease. After reviewing the risks  and benefits, the patient was deemed in                            satisfactory condition to undergo the procedure.                           After obtaining informed consent, the colonoscope                            was passed under direct vision. Throughout the                            procedure, the patient's blood pressure, pulse, and                          oxygen saturations were monitored continuously. The                            [id number]-[id number] ([id number]) scope was introduced through                            the anus and advanced to the the cecum, identified                            by appendiceal orifice and ileocecal valve. The                            colonoscopy was performed without difficulty. The                            patient tolerated the procedure well. The quality                            of the bowel preparation was excellent. The                            ileocecal valve, appendiceal orifice, and rectum                            were photographed. Scope In: 7:46:52 AM Scope Out: 8:03:47 AM Scope Withdrawal Time: 0 hours 12 minutes 5 seconds  Total Procedure Duration: 0 hours 16 minutes 55 seconds  Findings:      The perianal and digital rectal examinations were normal.      A small polyp was found in the distal sigmoid colon. The polyp was       removed with a cold snare. Resection and retrieval were complete.      The exam was otherwise normal throughout the examined colon.      The retroflexed view of the distal rectum and anal verge was normal and       showed no anal or rectal abnormalities. Impression:               - One small polyp in the distal sigmoid colon,  removed with a cold snare. Resected and retrieved. Moderate Sedation:      Moderate (conscious) sedation was administered by the endoscopy nurse       and supervised by the endoscopist. The following parameters were       monitored: oxygen saturation, heart rate, blood pressure, CO2       capnography and response to care. Total physician intraservice time was       25 minutes. Recommendation:           - Patient has a contact number available for                            emergencies. The signs and symptoms of potential                            delayed complications were discussed with the                             patient. Return to normal activities tomorrow.                            Written discharge instructions were provided to the                            patient.                           - Resume previous diet today.                           - Continue present medications.                           - Await pathology results.                           - Repeat colonoscopy for surveillance based on                            pathology results. Procedure Code(s):        --- Professional ---                           [phone], Colonoscopy, flexible; with removal of                            tumor(s), polyp(s), or other lesion(s) by snare                            technique                           G0500, Moderate sedation services provided by the                            same physician or other qualified health care  professional performing a gastrointestinal                            endoscopic service that sedation supports,                            requiring the presence of an independent trained                            observer to assist in the monitoring of the                            patient's level of consciousness and physiological                            status; initial 15 minutes of intra-service time;                            patient age 67 years or older (additional time may                            be reported with [phone], as appropriate)                           [phone], Moderate sedation services provided by the                            same physician or other qualified health care                            professional performing the diagnostic or                            therapeutic service that the sedation supports,                            requiring the presence of an independent trained                            observer to assist in the monitoring of the                            patient's level of  consciousness and physiological                            status; each additional 15 minutes intraservice                            time (List separately in addition to code for                            primary service) Diagnosis Code(s):        --- Professional ---  Z12.11, Encounter for screening for malignant                            neoplasm of colon                           D12.5, Benign neoplasm of sigmoid colon CPT copyright 2017 American Medical Association. All rights reserved. The codes documented in this report are preliminary and upon coder review may  be revised to meet current compliance requirements. Hildred Laser, MD Hildred Laser, MD 08/12/2017 8:11:07 AM This report has been signed electronically. Number of Addenda: 0

## 2017-08-12 NOTE — H&P (Signed)
Evan Jacobson is an 52 y.o. male.   Chief Complaint: Patient is here for colonoscopy. HPI: Patient is 52 year old Afro-American male who is here for screening colonoscopy.  He denies abdominal pain change in bowel habits or rectal bleeding. Family history is negative for CRC.  Past Medical History:  Diagnosis Date  . Allergic rhinitis   . Bipolar disorder ([place]) 08/25/2011  . Chronic back pain   . Concussion 2012  . GERD (gastroesophageal reflux disease)   . Hypertension   . PTSD (post-traumatic stress disorder)     Past Surgical History:  Procedure Laterality Date  . NO PAST SURGERIES      Family History  Problem Relation Age of Onset  . Stroke Father   . Hypertension Father   . Hypertension Mother        Evan Jacobson  . Heart disease Mother        Angina  . Hypertension Sister   . Hypertension Sister   . Hypertension Brother   . Hypertension Brother   . Colon cancer Neg Hx    Social History:  reports that he has never smoked. He has never used smokeless tobacco. He reports that he drinks alcohol. He reports that he does not use drugs.  Allergies:  Allergies  Allergen Reactions  . Sulfa Antibiotics Anaphylaxis  . Ace Inhibitors Other (See Comments)    Feels as though throat is closing up intermittently  . Sulfasalazine Other (See Comments)    Tongue swelled   . Sulfonamide Derivatives Other (See Comments)    Tongue swelled    Medications Prior to Admission  Medication Sig Dispense Refill  . amLODipine (NORVASC) 2.5 MG tablet TAKE 3 TABLETS (7.5 MG TOTAL) BY MOUTH DAILY. 270 tablet 0  . azelastine (ASTELIN) 0.1 % nasal spray Place 2 sprays into both nostrils 2 (two) times daily. Use in each nostril as directed 30 mL 12  . fluticasone (FLONASE) 50 MCG/ACT nasal spray Place 2 sprays into both nostrils daily. 16 g 6  . HYDROcodone-acetaminophen (NORCO) 10-325 MG tablet One tablet two times daily for back pain (Patient taking differently: Take 1 tablet by mouth 2 (two)  times daily. ) 60 tablet 0  . Krill Oil 500 MG CAPS Take 500 mg by mouth daily.     Marland Kitchen levocetirizine (XYZAL) 5 MG tablet Take 5 mg by mouth daily.     . Multiple Vitamin (MULTIVITAMIN WITH MINERALS) TABS tablet Take 2 tablets by mouth daily.     Marland Kitchen EPINEPHrine 0.3 mg/0.3 mL IJ SOAJ injection Inject 0.3 mg into the muscle once.       No results found for this or any previous visit (from the past 48 hour(s)). No results found.  ROS  Blood pressure (!) 130/95, pulse 79, temperature 97.7 F (36.5 C), temperature source Oral, resp. rate 16, height 5\' 11"  (1.803 m), weight 214 lb (97.1 kg), SpO2 100 %. Physical Exam  Constitutional: He appears well-developed and well-nourished.  HENT:  Mouth/Throat: Oropharynx is clear and moist.  Eyes: Conjunctivae are normal. No scleral icterus.  Neck: No thyromegaly present.  Cardiovascular: Normal rate and regular rhythm.  Murmur heard. Respiratory: Effort normal and breath sounds normal.  GI: Soft. He exhibits no distension and no mass. There is no tenderness.  Musculoskeletal: He exhibits no edema.  Lymphadenopathy:    He has no cervical adenopathy.  Neurological: He is alert.  Skin: Skin is warm and dry.     Assessment/Plan Average risk screening colonoscopy.  [employer],  MD 08/12/2017, 7:34 AM

## 2017-08-12 NOTE — Discharge Instructions (Signed)
Colonoscopy, Adult, Care After This sheet gives you information about how to care for yourself after your procedure. Your doctor may also give you more specific instructions. If you have problems or questions, call your doctor. Follow these instructions at home: General instructions   For the first 24 hours after the procedure: ? Do not drive or use machinery. ? Do not sign important documents. ? Do not drink alcohol. ? Do your daily activities more slowly than normal. ? Eat foods that are soft and easy to digest. ? Rest often.  Take over-the-counter or prescription medicines only as told by your doctor.  It is up to you to get the results of your procedure. Ask your doctor, or the department performing the procedure, when your results will be ready. To help cramping and bloating:  Try walking around.  Put heat on your belly (abdomen) as told by your doctor. Use a heat source that your doctor recommends, such as a moist heat pack or a heating pad. ? Put a towel between your skin and the heat source. ? Leave the heat on for 20-30 minutes. ? Remove the heat if your skin turns bright red. This is especially important if you cannot feel pain, heat, or cold. You can get burned. Eating and drinking  Drink enough fluid to keep your pee (urine) clear or pale yellow.  Return to your normal diet as told by your doctor. Avoid heavy or fried foods that are hard to digest.  Avoid drinking alcohol for as long as told by your doctor. Contact a doctor if:  You have blood in your poop (stool) 2-3 days after the procedure. Get help right away if:  You have more than a small amount of blood in your poop.  You see large clumps of tissue (blood clots) in your poop.  Your belly is swollen.  You feel sick to your stomach (nauseous).  You throw up (vomit).  You have a fever.  You have belly pain that gets worse, and medicine does not help your pain. This information is not intended to  replace advice given to you by your health care provider. Make sure you discuss any questions you have with your health care provider. Document Released: 02/15/2010 Document Revised: 10/08/2015 Document Reviewed: 10/08/2015 Elsevier Interactive Patient Education  2017 [place].   Colon Polyps Polyps are tissue growths inside the body. Polyps can grow in many places, including the large intestine (colon). A polyp may be a round bump or a mushroom-shaped growth. You could have one polyp or several. Most colon polyps are noncancerous (benign). However, some colon polyps can become cancerous over time. What are the causes? The exact cause of colon polyps is not known. What increases the risk? This condition is more likely to develop in people who:  Have a family history of colon cancer or colon polyps.  Are older than 77 or older than 45 if they are African American.  Have inflammatory bowel disease, such as ulcerative colitis or Crohn disease.  Are overweight.  Smoke cigarettes.  Do not get enough exercise.  Drink too much alcohol.  Eat a diet that is: ? High in fat and red meat. ? Low in fiber.  Had childhood cancer that was treated with abdominal radiation.  What are the signs or symptoms? Most polyps do not cause symptoms. If you have symptoms, they may include:  Blood coming from your rectum when having a bowel movement.  Blood in your stool.The stool may look  dark red or black.  A change in bowel habits, such as constipation or diarrhea.  How is this diagnosed? This condition is diagnosed with a colonoscopy. This is a procedure that uses a lighted, flexible scope to look at the inside of your colon. How is this treated? Treatment for this condition involves removing any polyps that are found. Those polyps will then be tested for cancer. If cancer is found, your health care provider will talk to you about options for colon cancer treatment. Follow these  instructions at home: Diet  Eat plenty of fiber, such as fruits, vegetables, and whole grains.  Eat foods that are high in calcium and vitamin D, such as milk, cheese, yogurt, eggs, liver, fish, and broccoli.  Limit foods high in fat, red meats, and processed meats, such as hot dogs, sausage, bacon, and lunch meats.  Maintain a healthy weight, or lose weight if recommended by your health care provider. General instructions  Do not smoke cigarettes.  Do not drink alcohol excessively.  Keep all follow-up visits as told by your health care provider. This is important. This includes keeping regularly scheduled colonoscopies. Talk to your health care provider about when you need a colonoscopy.  Exercise every day or as told by your health care provider. Contact a health care provider if:  You have new or worsening bleeding during a bowel movement.  You have new or increased blood in your stool.  You have a change in bowel habits.  You unexpectedly lose weight. This information is not intended to replace advice given to you by your health care provider. Make sure you discuss any questions you have with your health care provider. Document Released: 10/10/2003 Document Revised: 06/21/2015 Document Reviewed: 12/04/2014 Elsevier Interactive Patient Education  2018 [place], Care After These instructions provide you with information about caring for yourself after your procedure. Your health care provider may also give you more specific instructions. Your treatment has been planned according to current medical practices, but problems sometimes occur. Call your health care provider if you have any problems or questions after your procedure. What can I expect after the procedure? After your procedure, it is common to:  Feel sleepy for several hours.  Feel clumsy and have poor balance for several hours.  Feel forgetful about what happened after the  procedure.  Have poor judgment for several hours.  Feel nauseous or vomit.  Have a sore throat if you had a breathing tube during the procedure.  Follow these instructions at home: For at least 24 hours after the procedure:   Do not: ? Participate in activities in which you could fall or become injured. ? Drive. ? Use heavy machinery. ? Drink alcohol. ? Take sleeping pills or medicines that cause drowsiness. ? Make important decisions or sign legal documents. ? Take care of children on your own.  Rest. Eating and drinking  Follow the diet that is recommended by your health care provider.  If you vomit, drink water, juice, or soup when you can drink without vomiting.  Make sure you have little or no nausea before eating solid foods. General instructions  Have a responsible adult stay with you until you are awake and alert.  Take over-the-counter and prescription medicines only as told by your health care provider.  If you smoke, do not smoke without supervision.  Keep all follow-up visits as told by your health care provider. This is important. Contact a health care provider if:  You keep feeling nauseous or you keep vomiting.  You feel light-headed.  You develop a rash.  You have a fever. Get help right away if:  You have trouble breathing. This information is not intended to replace advice given to you by your health care provider. Make sure you discuss any questions you have with your health care provider. Document Released: 05/06/2015 Document Revised: 09/05/2015 Document Reviewed: 05/06/2015 Elsevier Interactive Patient Education  2018 [employer].     No aspirin or NSAIDs for 24 hours. Resume usual medications and diet as before. No driving for 24 hours. Physician will call with biopsy results.

## 2017-08-17 ENCOUNTER — Encounter (HOSPITAL_COMMUNITY): Payer: Self-pay | Admitting: Internal Medicine

## 2017-09-01 DIAGNOSIS — R42 Dizziness and giddiness: Secondary | ICD-10-CM | POA: Diagnosis not present

## 2017-09-24 ENCOUNTER — Telehealth: Payer: Self-pay | Admitting: Cardiology

## 2017-09-24 DIAGNOSIS — R9431 Abnormal electrocardiogram [ECG] [EKG]: Secondary | ICD-10-CM | POA: Diagnosis not present

## 2017-09-24 DIAGNOSIS — R079 Chest pain, unspecified: Secondary | ICD-10-CM | POA: Diagnosis not present

## 2017-09-24 DIAGNOSIS — Z8669 Personal history of other diseases of the nervous system and sense organs: Secondary | ICD-10-CM | POA: Diagnosis not present

## 2017-09-24 DIAGNOSIS — R0789 Other chest pain: Secondary | ICD-10-CM | POA: Diagnosis not present

## 2017-09-24 NOTE — Telephone Encounter (Signed)
Returned pt call. No answer, left message for pt to return my call.

## 2017-09-24 NOTE — Telephone Encounter (Signed)
Per phone call from pt-- he's been having tightness in his chest the past few days.   Scheduled to see SM on 10/21/17

## 2017-09-29 NOTE — Telephone Encounter (Signed)
Called pt. No answer. Left message for pt to return call.  

## 2017-10-09 DIAGNOSIS — R1012 Left upper quadrant pain: Secondary | ICD-10-CM | POA: Diagnosis not present

## 2017-10-13 ENCOUNTER — Encounter: Payer: Self-pay | Admitting: Family Medicine

## 2017-10-13 ENCOUNTER — Ambulatory Visit (INDEPENDENT_AMBULATORY_CARE_PROVIDER_SITE_OTHER): Payer: 59 | Admitting: Family Medicine

## 2017-10-13 VITALS — BP 130/90 | HR 94 | Resp 16 | Ht 71.0 in | Wt 225.0 lb

## 2017-10-13 DIAGNOSIS — Z125 Encounter for screening for malignant neoplasm of prostate: Secondary | ICD-10-CM

## 2017-10-13 DIAGNOSIS — F321 Major depressive disorder, single episode, moderate: Secondary | ICD-10-CM

## 2017-10-13 DIAGNOSIS — I10 Essential (primary) hypertension: Secondary | ICD-10-CM | POA: Diagnosis not present

## 2017-10-13 DIAGNOSIS — R52 Pain, unspecified: Secondary | ICD-10-CM | POA: Diagnosis not present

## 2017-10-13 DIAGNOSIS — Z13220 Encounter for screening for lipoid disorders: Secondary | ICD-10-CM

## 2017-10-13 DIAGNOSIS — E669 Obesity, unspecified: Secondary | ICD-10-CM

## 2017-10-13 DIAGNOSIS — J302 Other seasonal allergic rhinitis: Secondary | ICD-10-CM

## 2017-10-13 MED ORDER — HYDROCODONE-ACETAMINOPHEN 10-325 MG PO TABS: ORAL_TABLET | ORAL | 0 refills | Status: DC

## 2017-10-13 MED ORDER — HYDROCODONE-ACETAMINOPHEN 10-325 MG PO TABS: ORAL_TABLET | ORAL | 0 refills | Status: AC

## 2017-10-13 NOTE — Assessment & Plan Note (Signed)
The patient's Controlled Substance registry is reviewed and compliance confirmed. Adequacy of  Pain control and level of function is assessed. Medication dosing is adjusted as deemed appropriate. Twelve weeks of medication is prescribed , patient signs for the script and is provided with a follow up appointment between 11 to 12 weeks .  

## 2017-10-13 NOTE — Assessment & Plan Note (Signed)
Uncontrolled on amlodipine 7.5 mg and 10 pound weight gain, needs to work on weight loss. No med changes DASH diet and commitment to daily physical activity for a minimum of 30 minutes discussed and encouraged, as a part of hypertension management. The importance of attaining a healthy weight is also discussed.  BP/Weight 10/13/2017 08/12/2017 07/23/2017 06/30/2017 06/18/2017 04/23/2017 1/32/4401  Systolic BP 027 253 664 403 474 259 563  Diastolic BP 90 81 84 78 90 84 64  Wt. (Lbs) 225 214 223 222 223 216 219  BMI 31.38 29.85 31.1 30.96 31.1 30.13 30.54  Some encounter information is confidential and restricted. Go to Review Flowsheets activity to see all data.

## 2017-10-13 NOTE — Assessment & Plan Note (Signed)
Deteriorated. Patient re-educated about  the importance of commitment to a  minimum of 150 minutes of exercise per week.  The importance of healthy food choices with portion control discussed. Encouraged to start a food diary, count calories and to consider  joining a support group. Sample diet sheets offered. Goals set by the patient for the next several months.   Weight /BMI 10/13/2017 08/12/2017 07/23/2017  WEIGHT 225 lb 214 lb 223 lb  HEIGHT 5\' 11"  5\' 11"  5\' 11"   BMI 31.38 kg/m2 29.85 kg/m2 31.1 kg/m2  Some encounter information is confidential and restricted. Go to Review Flowsheets activity to see all data.

## 2017-10-13 NOTE — Progress Notes (Signed)
Evan Jacobson     MRN: [id number]      DOB: 07-Jul-1965   HPI [name] is here for follow up and re-evaluation of chronic medical conditions, medication management and review of any available recent lab and radiology data.  Preventive health is updated, specifically  Cancer screening and Immunization.  Refuses flu vaccine Being treated by mental health a the vA in group sessions weekly reportdly as well as seeing his psychiatrist more often Drinks 6 pack beer / week, advised him to stop entirely Overeating and under exercising with weight gain, will work on this, blood pressure is increased. The PT denies any adverse reactions to current medications since the last visit.  Woke with tender swelling on right leg 3 days ago, no known trauma, it is improving over time Back pain rated at 7, inconsistently taking pain meds out x 1 week, states he was out of town  ROS Denies recent fever or chills. Denies sinus pressure, nasal congestion, ear pain or sore throat. Denies chest congestion, productive cough or wheezing. Denies chest pains, palpitations and leg swelling Denies abdominal pain, nausea, vomiting,diarrhea or constipation.   Denies dysuria, frequency, hesitancy or incontinence.  Denies headaches, seizures, numbness, or tingling. Denies uncontrolled depression, anxiety or insomnia. Denies skin break down or rash.   PE  BP 130/90   Pulse 94   Resp 16   Ht 5\' 11"  (1.803 m)   Wt 225 lb (102.1 kg)   SpO2 98%   BMI 31.38 kg/m   Patient alert and oriented and in no cardiopulmonary distress.  HEENT: No facial asymmetry, EOMI,   oropharynx pink and moist.  Neck supple no JVD, no mass.  Chest: Clear to auscultation bilaterally.  CVS: S1, S2 no murmurs, no S3.Regular rate.  ABD: Soft non tender.   Ext: No edema  MH:[id number] though adequate  ROM spine, shoulders, hips and knees.  Skin: Intact, no ulcerations or rash noted.slight swelling on an  Psych: Good eye  contact, normal affect. Memory intact not anxious or depressed appearing.  CNS: CN 2-12 intact, power,  normal throughout.no focal deficits noted.   Assessment & Plan  Essential hypertension Uncontrolled on amlodipine 7.5 mg and 10 pound weight gain, needs to work on weight loss. No med changes DASH diet and commitment to daily physical activity for a minimum of 30 minutes discussed and encouraged, as a part of hypertension management. The importance of attaining a healthy weight is also discussed.  BP/Weight 10/13/2017 08/12/2017 07/23/2017 06/30/2017 06/18/2017 04/23/2017 10/18/1939  Systolic BP 740 814 481 856 314 970 263  Diastolic BP 90 81 84 78 90 84 64  Wt. (Lbs) 225 214 223 222 223 216 219  BMI 31.38 29.85 31.1 30.96 31.1 30.13 30.54  Some encounter information is confidential and restricted. Go to Review Flowsheets activity to see all data.       Obesity (BMI 30.0-34.9) Deteriorated. Patient re-educated about  the importance of commitment to a  minimum of 150 minutes of exercise per week.  The importance of healthy food choices with portion control discussed. Encouraged to start a food diary, count calories and to consider  joining a support group. Sample diet sheets offered. Goals set by the patient for the next several months.   Weight /BMI 10/13/2017 08/12/2017 07/23/2017  WEIGHT 225 lb 214 lb 223 lb  HEIGHT 5\' 11"  5\' 11"  5\' 11"   BMI 31.38 kg/m2 29.85 kg/m2 31.1 kg/m2  Some encounter information is confidential and restricted. Go to  Review Flowsheets activity to see all data.      Encounter for pain management The patient's Controlled Substance registry is reviewed and compliance confirmed. Adequacy of  Pain control and level of function is assessed. Medication dosing is adjusted as deemed appropriate. Twelve weeks of medication is prescribed , patient signs for the script and is provided with a follow up appointment between 11 to 12 weeks .   Depression, major, single  episode, moderate (HCC) Treated through [place], reports in tense outpatient therapy, encouraged to diligently pursue this Encouraged to start regular exercise to help with depression management  Seasonal allergies Controlled, no change in medication

## 2017-10-13 NOTE — Patient Instructions (Addendum)
F/U in 12 weeks, call if you need me before  Lipid, cmp and EGFR, PSA  Today \   Meds are being refilled  It is important that you exercise regularly at least 30 minutes 5 times a week. If you develop chest pain, have severe difficulty breathing, or feel very tired, stop exercising immediately and seek medical attention

## 2017-10-13 NOTE — Progress Notes (Signed)
Fill Date ID Written Drug Qty Days Prescriber Rx # Pharmacy Refill Daily Dose * Pymt Type PMP  10/05/2017  1  07/26/2017  Hydrocodone-Acetamin 10-325 Mg  60.00 30 Ma Sim  [id number]  [place] ([id number])  0/0 20.00 MME Comm Ins  [place]  08/26/2017  1  07/26/2017  Hydrocodone-Acetamin 10-325 Mg  60.00 30 Ma Sim  [id number]  [place] ([id number])  0/0 20.00 MME Comm Ins  [place]  07/23/2017  1  07/23/2017  Hydrocodone-Acetamin 10-325 Mg  60.00 30 Ma Sim  [id number]  [place] ([id number])  0/0 20.00 MME Comm Ins  [place]  05/20/2017  1  02/12/2017  Hydrocodone-Acetamin 10-325 Mg  60.00 30 Ma Sim  [id number]  [place] ([id number])  0/0 20.00 MME Comm Ins  [place]  04/17/2017  1  02/12/2017  Hydrocodone-Acetamin 10-325 Mg  60.00 30 Ma Sim  [id number]  [place] ([id number])  0/0 20.00 MME Comm Ins  [place]  03/06/2017  1  02/12/2017  Hydrocodone-Acetamin 10-325 Mg  60.00 30 Ma Sim  [id number]  [place] ([id number])  0/0 20.00 MME Comm Ins  [place]  01/30/2017  1  11/26/2016  Hydrocodone-Acetamin 10-325 Mg  60.00 30 Ma Sim  [id number]  [place] ([id number])  0/0 20.00 MME Comm Ins  [place]  12/24/2016  2  11/26/2016  Hydrocodone-Acetamin 10-325 Mg  60.00 30 Ma Sim  [id number]  [place] ([id number])  0/0 20.00 MME Comm Ins  [place]  11/26/2016  1  11/26/2016  Hydrocodone-Acetamin 10-325 Mg  60.00 30 Ma Sim  [id number]  [place] ([id number])  0/0 20.00 MME Comm Ins  [place]  10/17/2016  1  08/19/2016  Hydrocodone-Acetamin 10-325 Mg  60.00 30 Ma Sim  [id number]  [place] ([id number])  0/0 20.00 MME Comm Ins  [place]  09/17/2016  1  09/17/2016  Hydrocodone-Acetamin 10-325 Mg  60.00 30 Ma Sim  [id number]  [place] ([id number])  0/0 20.00 MME Comm Ins    08/19/2016  1  08/19/2016  Hydrocodone-Acetamin 10-325 Mg  60.00 30 Ma Sim  [id number]  [place] ([id number])  0/0 20.00 MME Comm Ins    06/06/2016  1  06/06/2016  Hydrocodone-Acetamin 10-325 Mg  60.00 30 Ma Sim  [id number]  [place] ([id number])  0/0

## 2017-10-18 DIAGNOSIS — G8929 Other chronic pain: Secondary | ICD-10-CM | POA: Insufficient documentation

## 2017-10-18 NOTE — Assessment & Plan Note (Signed)
Controlled, no change in medication  

## 2017-10-18 NOTE — Assessment & Plan Note (Signed)
Treated through [place], reports in tense outpatient therapy, encouraged to diligently pursue this Encouraged to start regular exercise to help with depression management

## 2017-10-21 ENCOUNTER — Encounter: Payer: Self-pay | Admitting: Cardiology

## 2017-10-21 ENCOUNTER — Ambulatory Visit (INDEPENDENT_AMBULATORY_CARE_PROVIDER_SITE_OTHER): Payer: 59 | Admitting: Cardiology

## 2017-10-21 VITALS — BP 122/80 | HR 87 | Ht 71.0 in | Wt 224.0 lb

## 2017-10-21 DIAGNOSIS — I10 Essential (primary) hypertension: Secondary | ICD-10-CM

## 2017-10-21 DIAGNOSIS — R002 Palpitations: Secondary | ICD-10-CM | POA: Diagnosis not present

## 2017-10-21 NOTE — Progress Notes (Signed)
Cardiology Office Note  Date: 10/21/2017   ID: Evan Jacobson, DOB 1965-12-02, MRN [id number]  PCP: Fayrene Helper, MD  Primary Cardiologist: Rozann Lesches, MD   Chief Complaint  Patient presents with  . History of palpitations    History of Present Illness: Evan Jacobson is a 52 y.o. male last seen in May.  We have assessed him previously with a history of lightheadedness and palpitations, and he has undergone extensive additional evaluation through the [facility].  Per our assessment he had no significant arrhythmias by monitoring and exercise echocardiogram was negative for ischemia last June.  He has had improvement in dizziness with use of vestibular exercises.  He presents to the office for requested follow-up.  He states that back in August he was experiencing a feeling of palpitations with a brief tightness, lasting only a few seconds at a time, no dizziness or exertional symptoms, no syncope.  He happened to be at the [facility] hospital and was evaluated in the ER.  He states that they diagnosed him with muscle fasciculations, no specific arrhythmias.  Since that time the symptoms have resolved completely.  Otherwise he reports no functional limitations, just started exercising again.  He walked on the treadmill at 4 mph for 3 miles earlier this morning without symptoms.  I personally reviewed his ECG today which shows sinus rhythm with nonspecific ST-T changes.  Past Medical History:  Diagnosis Date  . Allergic rhinitis   . Bipolar disorder ([place]) 08/25/2011  . Chronic back pain   . Concussion 2012  . GERD (gastroesophageal reflux disease)   . Hypertension   . PTSD (post-traumatic stress disorder)     Past Surgical History:  Procedure Laterality Date  . COLONOSCOPY N/A 08/12/2017   Procedure: COLONOSCOPY;  Surgeon: Rogene Houston, MD;  Location: [facility];  Service: Endoscopy;  Laterality: N/A;  730  . NO PAST SURGERIES      Current Outpatient Medications   Medication Sig Dispense Refill  . amLODipine (NORVASC) 2.5 MG tablet TAKE 3 TABLETS (7.5 MG TOTAL) BY MOUTH DAILY. 270 tablet 0  . azelastine (ASTELIN) 0.1 % nasal spray Place 2 sprays into both nostrils 2 (two) times daily. Use in each nostril as directed 30 mL 12  . EPINEPHrine 0.3 mg/0.3 mL IJ SOAJ injection Inject 0.3 mg into the muscle once.     . fluticasone (FLONASE) 50 MCG/ACT nasal spray Place 2 sprays into both nostrils daily. 16 g 6  . HYDROcodone-acetaminophen (NORCO) 10-325 MG tablet One tablet two times daily for back pain (Patient taking differently: Take 1 tablet by mouth 2 (two) times daily. ) 60 tablet 0  . HYDROcodone-acetaminophen (NORCO) 10-325 MG tablet Take one tablet two times daily for back pain 60 tablet 0  . [START ON 11/12/2017] HYDROcodone-acetaminophen (NORCO) 10-325 MG tablet Take one tablet two times daily for back pain 60 tablet 0  . [START ON 12/13/2017] HYDROcodone-acetaminophen (NORCO) 10-325 MG tablet Take one tablet two times daily for back pain 60 tablet 0  . Krill Oil 500 MG CAPS Take 500 mg by mouth daily.     Marland Kitchen levocetirizine (XYZAL) 5 MG tablet Take 5 mg by mouth daily.     . Multiple Vitamin (MULTIVITAMIN WITH MINERALS) TABS tablet Take 2 tablets by mouth daily.      No current facility-administered medications for this visit.    Allergies:  Sulfa antibiotics; Ace inhibitors; Sulfasalazine; and Sulfonamide derivatives   Social History: The patient  reports  that he has never smoked. He has never used smokeless tobacco. He reports that he drinks alcohol. He reports that he does not use drugs.   ROS:  Please see the history of present illness. Otherwise, complete review of systems is positive for none.  All other systems are reviewed and negative.   Physical Exam: VS:  BP 122/80 (BP Location: Right Arm)   Pulse 87   Ht 5\' 11"  (1.803 m)   Wt 224 lb (101.6 kg)   SpO2 97%   BMI 31.24 kg/m , BMI Body mass index is 31.24 kg/m.  Wt Readings from  Last 3 Encounters:  10/21/17 224 lb (101.6 kg)  10/13/17 225 lb (102.1 kg)  08/12/17 214 lb (97.1 kg)    General: Patient appears comfortable at rest. HEENT: Conjunctiva and lids normal, oropharynx clear. Neck: Supple, no elevated JVP or carotid bruits, no thyromegaly. Lungs: Clear to auscultation, nonlabored breathing at rest. Cardiac: Regular rate and rhythm, no S3 or significant systolic murmur. Abdomen: Soft, nontender, bowel sounds present. Extremities: No pitting edema, distal pulses 2+. Skin: Warm and dry. Musculoskeletal: No kyphosis. Neuropsychiatric: Alert and oriented x3, affect grossly appropriate.  ECG: I personally reviewed the tracing from 02/13/2017 which showed sinus rhythm with nonspecific ST changes.  Recent Labwork: 01/26/2017: ALT 25; AST 20; BUN 16; Creat 1.22; Hemoglobin 15.0; Platelets 217; Potassium 4.1; Sodium 138; TSH 1.23     Component Value Date/Time   CHOL 185 01/26/2017 0918   TRIG 98 01/26/2017 0918   HDL 49 01/26/2017 0918   CHOLHDL 3.8 01/26/2017 0918   VLDL 14 06/18/2016 0936   LDLCALC 116 (H) 01/26/2017 0918    Other Studies Reviewed Today:  30-day event recorder 02/16/2017: Available strips from 30-day event recorder reviewed. Sinus rhythm, sinus arrhythmia, and sinus tachycardia noted without clear correlation of specific heart rate to symptoms. No obvious sustained arrhythmias or pauses.  Carotid Dopplers 02/04/2017: IMPRESSION: 1. No significant carotid plaque or stenosis. 2. Antegrade bilateral vertebral arterial flow.  Exercise echocardiogram 07/01/2016: Study Conclusions  - Stress ECG conclusions: The stress ECG was negative for ischemia. [facility] scoring: exercise time of 10 min; maximum ST deviation of 0 mm; no angina; resulting score is 10. This score predicts a low risk of cardiac events. - Staged echo: There was no diagnostic evidence for stress-induced ischemia.  Assessment and Plan:  1.  Patient reported  palpitations back in August, discussed above.  The symptoms have resolved spontaneously and he reports evaluation through the [facility] hospital system which was reassuring.  Follow-up ECG today is stable with nonspecific ST-T changes.  He does not report any exertional symptoms or syncope at this time.  Would recommend observation.  Keep follow-up with PCP for now.  2.  Essential hypertension, blood pressure well controlled today.  He remains on Norvasc.  Keep follow-up with PCP.  Current medicines were reviewed with the patient today.   Orders Placed This Encounter  Procedures  . EKG 12-Lead    Disposition: Follow-up as needed.  Signed, Satira Sark, MD, [facility] 10/21/2017 9:47 AM    [facility]. [address] [place], [place] [address] Phone: [phone]; Fax: [phone]

## 2017-10-21 NOTE — Patient Instructions (Addendum)
Your physician wants you to follow-up in: as needed  with [place]     Your physician recommends that you continue on your current medications as directed. Please refer to the Current Medication list given to you today.     If you need a refill on your cardiac medications before your next appointment, please call your pharmacy.      No lab work or testing ordered.       Thank you for choosing [facility] !

## 2017-10-26 ENCOUNTER — Encounter: Payer: Self-pay | Admitting: Family Medicine

## 2017-10-26 ENCOUNTER — Other Ambulatory Visit: Payer: Self-pay | Admitting: Family Medicine

## 2017-10-26 DIAGNOSIS — M7989 Other specified soft tissue disorders: Secondary | ICD-10-CM

## 2017-10-26 NOTE — Progress Notes (Signed)
X ray order per pt request

## 2017-10-27 ENCOUNTER — Ambulatory Visit (HOSPITAL_COMMUNITY)
Admission: RE | Admit: 2017-10-27 | Discharge: 2017-10-27 | Disposition: A | Discharge status: Home / Self Care | Payer: 59 | Source: Ambulatory Visit | Attending: Family Medicine | Admitting: Family Medicine

## 2017-10-27 DIAGNOSIS — M7989 Other specified soft tissue disorders: Secondary | ICD-10-CM | POA: Diagnosis present

## 2017-10-30 ENCOUNTER — Encounter: Payer: Self-pay | Admitting: Family Medicine

## 2017-11-10 ENCOUNTER — Ambulatory Visit: Payer: Self-pay | Admitting: Family Medicine

## 2017-11-17 ENCOUNTER — Ambulatory Visit (INDEPENDENT_AMBULATORY_CARE_PROVIDER_SITE_OTHER): Payer: 59 | Admitting: Family Medicine

## 2017-11-17 ENCOUNTER — Encounter: Payer: Self-pay | Admitting: Family Medicine

## 2017-11-17 VITALS — BP 116/88 | HR 76 | Resp 12 | Ht 71.0 in | Wt 220.1 lb

## 2017-11-17 DIAGNOSIS — R2242 Localized swelling, mass and lump, left lower limb: Secondary | ICD-10-CM

## 2017-11-17 DIAGNOSIS — R52 Pain, unspecified: Secondary | ICD-10-CM | POA: Diagnosis not present

## 2017-11-17 DIAGNOSIS — M25472 Effusion, left ankle: Secondary | ICD-10-CM | POA: Diagnosis not present

## 2017-11-17 DIAGNOSIS — I10 Essential (primary) hypertension: Secondary | ICD-10-CM

## 2017-11-17 DIAGNOSIS — Z13220 Encounter for screening for lipoid disorders: Secondary | ICD-10-CM

## 2017-11-17 DIAGNOSIS — Z23 Encounter for immunization: Secondary | ICD-10-CM

## 2017-11-17 DIAGNOSIS — Z125 Encounter for screening for malignant neoplasm of prostate: Secondary | ICD-10-CM

## 2017-11-17 DIAGNOSIS — E669 Obesity, unspecified: Secondary | ICD-10-CM

## 2017-11-17 DIAGNOSIS — E559 Vitamin D deficiency, unspecified: Secondary | ICD-10-CM

## 2017-11-17 HISTORY — DX: Localized swelling, mass and lump, left lower limb: R22.42

## 2017-11-17 NOTE — Assessment & Plan Note (Signed)
Left ankle swelling 1 episode , will ask ortho to eval

## 2017-11-17 NOTE — Patient Instructions (Addendum)
Annual physical exam  Week of Jan 10 , shingrix  #2 at visit  Shingrix #1 today  Congrats on 5 pound weight loss, an additional 7 pounds will be good, then BP will be at goal   You are being referred  To Dr Luna Glasgow re left ankle and left a leg lump  Pain meds will carry through January    Fasting lipid, cmp and eGFr, PSA TSH and vit D December 31 or after for Jan visit  Thank you  for choosing [facility]. We consider it a privelige to serve you.  Delivering excellent health care in a caring and  compassionate way is our goal.  Partnering with you,  so that together we can achieve this goal is our strategy.

## 2017-11-23 ENCOUNTER — Encounter: Payer: Self-pay | Admitting: Family Medicine

## 2017-11-23 MED ORDER — HYDROCODONE-ACETAMINOPHEN 10-325 MG PO TABS: ORAL_TABLET | ORAL | 0 refills | Status: AC

## 2017-11-23 MED ORDER — HYDROCODONE-ACETAMINOPHEN 10-325 MG PO TABS: ORAL_TABLET | ORAL | 0 refills | Status: DC

## 2017-11-23 NOTE — Assessment & Plan Note (Signed)
The patient's Controlled Substance registry is reviewed and compliance confirmed. Adequacy of  Pain control and level of function is assessed. Medication dosing is adjusted as deemed appropriate. Twelve weeks of medication is prescribed , patient signs for the script and is provided with a follow up appointment between 11 to 12 weeks .  

## 2017-11-23 NOTE — Progress Notes (Signed)
Evan Jacobson     MRN: [id number]      DOB: Mar 15, 1965   HPI [name] is here for follow up and re-evaluation of chronic medical conditions, medication management and review of any available recent lab and radiology data.  Preventive health is updated, specifically  Cancer screening and Immunization.   Questions or concerns regarding consultations or procedures which the PT has had in the interim are  addressed. The PT denies any adverse reactions to current medications since the last visit.  C/o swelling o left leg, no trauma, not painful but persists ROS Denies recent fever or chills. Denies sinus pressure, nasal congestion, ear pain or sore throat. Denies chest congestion, productive cough or wheezing. Denies chest pains, palpitations and leg swelling Denies abdominal pain, nausea, vomiting,diarrhea or constipation.   Denies dysuria, frequency, hesitancy or incontinence. C/o chronic back and lower extremity pain Denies headaches, seizures, numbness, or tingling. Denies uncontrolled  depression, anxiety or insomnia. Denies skin break down or rash.   PE  BP 116/88 (BP Location: Right Arm, Patient Position: Sitting, Cuff Size: Large)   Pulse 76   Resp 12   Ht 5\' 11"  (1.803 m)   Wt 220 lb 1.3 oz (99.8 kg)   SpO2 99% Comment: room air  BMI 30.69 kg/m   Patient alert and oriented and in no cardiopulmonary distress.  HEENT: No facial asymmetry, EOMI,   oropharynx pink and moist.  Neck supple no JVD, no mass.  Chest: Clear to auscultation bilaterally.  CVS: S1, S2 no murmurs, no S3.Regular rate.  ABD: Soft non tender.   Ext: No edema  MS: Adequate though reduced  ROM lumbar  spine,  Adequate in shoulders, hips and knees.Non tender swelling on distal aspect left leg , approx 5 cm  Diameter, no erythema or warmth  Skin: Intact, no ulcerations or rash noted.  Psych: Good eye contact, normal affect. Memory intact not anxious or depressed appearing.  CNS: CN 2-12  intact, power,  normal throughout.no focal deficits noted.   Assessment & Plan  Ankle swelling, left Left ankle swelling 1 episode , will ask ortho to eval  Skin lump of leg, left left leg swelling, normal X ary appearance , however pt concerned and also has left ankle swelling episode x 1 , refer to Ortho, no current swelling  Essential hypertension DASH diet and commitment to daily physical activity for a minimum of 30 minutes discussed and encouraged, as a part of hypertension management. The importance of attaining a healthy weight is also discussed. Diastolic pressure abobve goal, weight loss and sodium reduction needed  BP/Weight 11/17/2017 10/21/2017 10/13/2017 08/12/2017 07/23/2017 06/30/2017 07/13/735  Systolic BP 106 269 485 462 703 500 938  Diastolic BP 88 80 90 81 84 78 90  Wt. (Lbs) 220.08 224 225 214 223 222 223  BMI 30.69 31.24 31.38 29.85 31.1 30.96 31.1  Some encounter information is confidential and restricted. Go to Review Flowsheets activity to see all data.       Obesity (BMI 30.0-34.9) Improved, applauded on this and encouraged to continue same Patient re-educated about  the importance of commitment to a  minimum of 150 minutes of exercise per week.  The importance of healthy food choices with portion control discussed. Encouraged to start a food diary, count calories and to consider  joining a support group. Sample diet sheets offered. Goals set by the patient for the next several months.   Weight /BMI 11/17/2017 10/21/2017 10/13/2017  WEIGHT 220 lb  1.3 oz 224 lb 225 lb  HEIGHT 5\' 11"  5\' 11"  5\' 11"   BMI 30.69 kg/m2 31.24 kg/m2 31.38 kg/m2  Some encounter information is confidential and restricted. Go to Review Flowsheets activity to see all data.      Encounter for pain management The patient's Controlled Substance registry is reviewed and compliance confirmed. Adequacy of  Pain control and level of function is assessed. Medication dosing is adjusted as  deemed appropriate. Twelve weeks of medication is prescribed , patient signs for the script and is provided with a follow up appointment between 11 to 12 weeks .

## 2017-11-23 NOTE — Assessment & Plan Note (Signed)
DASH diet and commitment to daily physical activity for a minimum of 30 minutes discussed and encouraged, as a part of hypertension management. The importance of attaining a healthy weight is also discussed. Diastolic pressure abobve goal, weight loss and sodium reduction needed  BP/Weight 11/17/2017 10/21/2017 10/13/2017 08/12/2017 07/23/2017 06/30/2017 5/88/5027  Systolic BP 741 287 867 672 094 709 628  Diastolic BP 88 80 90 81 84 78 90  Wt. (Lbs) 220.08 224 225 214 223 222 223  BMI 30.69 31.24 31.38 29.85 31.1 30.96 31.1  Some encounter information is confidential and restricted. Go to Review Flowsheets activity to see all data.

## 2017-11-23 NOTE — Assessment & Plan Note (Signed)
left leg swelling, normal X ary appearance , however pt concerned and also has left ankle swelling episode x 1 , refer to Ortho, no current swelling

## 2017-11-23 NOTE — Assessment & Plan Note (Signed)
Improved, applauded on this and encouraged to continue same Patient re-educated about  the importance of commitment to a  minimum of 150 minutes of exercise per week.  The importance of healthy food choices with portion control discussed. Encouraged to start a food diary, count calories and to consider  joining a support group. Sample diet sheets offered. Goals set by the patient for the next several months.   Weight /BMI 11/17/2017 10/21/2017 10/13/2017  WEIGHT 220 lb 1.3 oz 224 lb 225 lb  HEIGHT 5\' 11"  5\' 11"  5\' 11"   BMI 30.69 kg/m2 31.24 kg/m2 31.38 kg/m2  Some encounter information is confidential and restricted. Go to Review Flowsheets activity to see all data.

## 2017-12-02 ENCOUNTER — Ambulatory Visit: Payer: 59 | Admitting: Orthopaedic Surgery

## 2017-12-09 ENCOUNTER — Ambulatory Visit: Payer: 59 | Admitting: Orthopaedic Surgery

## 2017-12-09 ENCOUNTER — Encounter: Payer: Self-pay | Admitting: Orthopaedic Surgery

## 2017-12-09 VITALS — BP 130/92 | HR 74 | Ht 71.0 in | Wt 221.0 lb

## 2017-12-09 DIAGNOSIS — M25562 Pain in left knee: Secondary | ICD-10-CM | POA: Diagnosis not present

## 2017-12-09 NOTE — Progress Notes (Signed)
Subjective:    Patient ID: Evan Jacobson, male    DOB: 1965-10-26, 52 y.o.   MRN: [id number]  HPI He developed swelling and pain of the left lower leg more anterior and medially about a month ago.  He said the swelling was significant and it went over the top of his tennis shoes.  He had pain.  He did not take a picture of it.  He saw Dr. Moshe Cipro about this on 11-17-17.  X-rays of his lower leg were negative.  He is concerned that it happened and it could happen again.  He has a small "knot" of the lower leg but that does not bother him now.  He has no numbness, no problems with the left today or in the last week or so.  He is doing well now.   Review of Systems  Constitutional: Positive for activity change.  Musculoskeletal: Positive for arthralgias, back pain, gait problem and joint swelling.  Allergic/Immunologic: Positive for environmental allergies.  All other systems reviewed and are negative.  For Review of Systems, all other systems reviewed and are negative.  The following is a summary of the past history medically, past history surgically, known current medicines, social history and family history.  This information is gathered electronically by the computer from prior information and documentation.  I review this each visit and have found including this information at this point in the chart is beneficial and informative.   Past Medical History:  Diagnosis Date  . Allergic rhinitis   . Bipolar disorder ([place]) 08/25/2011  . Chronic back pain   . Concussion 2012  . GERD (gastroesophageal reflux disease)   . Hypertension   . PTSD (post-traumatic stress disorder)     Past Surgical History:  Procedure Laterality Date  . COLONOSCOPY N/A 08/12/2017   Procedure: COLONOSCOPY;  Surgeon: Rogene Houston, MD;  [facility];  Service: Endoscopy;  Laterality: N/A;  730  . NO PAST SURGERIES      Current Outpatient Medications on File Prior to Visit  Medication Sig  Dispense Refill  . amLODipine (NORVASC) 2.5 MG tablet TAKE 3 TABLETS (7.5 MG TOTAL) BY MOUTH DAILY. 270 tablet 0  . azelastine (ASTELIN) 0.1 % nasal spray Place 2 sprays into both nostrils 2 (two) times daily. Use in each nostril as directed 30 mL 12  . EPINEPHrine 0.3 mg/0.3 mL IJ SOAJ injection Inject 0.3 mg into the muscle once.     . fluticasone (FLONASE) 50 MCG/ACT nasal spray Place 2 sprays into both nostrils daily. 16 g 6  . HYDROcodone-acetaminophen (NORCO) 10-325 MG tablet Take one tablet two times daily for back pain 60 tablet 0  . [START ON 12/13/2017] HYDROcodone-acetaminophen (NORCO) 10-325 MG tablet Take one tablet two times daily for back pain 60 tablet 0  . [START ON 01/12/2018] HYDROcodone-acetaminophen (NORCO) 10-325 MG tablet Take one tablet two times daily for back pain 60 tablet 0  . Krill Oil 500 MG CAPS Take 500 mg by mouth daily.     Marland Kitchen levocetirizine (XYZAL) 5 MG tablet Take 5 mg by mouth daily.     . Multiple Vitamin (MULTIVITAMIN WITH MINERALS) TABS tablet Take 2 tablets by mouth daily.      No current facility-administered medications on file prior to visit.     Social History   Socioeconomic History  . Marital status: Married    Spouse name: Not on file  . Number of children: 2  . Years of education: Not  on file  . Highest education level: Not on file  Occupational History    Comment: Audio video tech    Employer: [employer]  . Financial resource strain: Not on file  . Food insecurity:    Worry: Not on file    Inability: Not on file  . Transportation needs:    Medical: Not on file    Non-medical: Not on file  Tobacco Use  . Smoking status: Never Smoker  . Smokeless tobacco: Never Used  Substance and Sexual Activity  . Alcohol use: Yes    Comment: occ; six pack per week  . Drug use: No  . Sexual activity: Yes    Birth control/protection: None  Lifestyle  . Physical activity:    Days per week: Not on file    Minutes per session: Not  on file  . Stress: Not on file  Relationships  . Social connections:    Talks on phone: Not on file    Gets together: Not on file    Attends religious service: Not on file    Active member of club or organization: Not on file    Attends meetings of clubs or organizations: Not on file    Relationship status: Not on file  . Intimate partner violence:    Fear of current or ex partner: Not on file    Emotionally abused: Not on file    Physically abused: Not on file    Forced sexual activity: Not on file  Other Topics Concern  . Not on file  Social History Narrative  . Not on file    Family History  Problem Relation Age of Onset  . Stroke Father   . Hypertension Father   . Hypertension Mother        [name]  . Heart disease Mother        Angina  . Hypertension Sister   . Hypertension Sister   . Hypertension Brother   . Hypertension Brother   . Colon cancer Neg Hx     BP (!) 130/92   Pulse 74   Ht 5\' 11"  (1.803 m)   Wt 221 lb (100.2 kg)   BMI 30.82 kg/m   Body mass index is 30.82 kg/m.      Objective:   Physical Exam  Constitutional: He is oriented to person, place, and time. He appears well-developed and well-nourished.  HENT:  Head: Normocephalic and atraumatic.  Eyes: Pupils are equal, round, and reactive to light. Conjunctivae and EOM are normal.  Neck: Normal range of motion. Neck supple.  Cardiovascular: Normal rate, regular rhythm and intact distal pulses.  Pulmonary/Chest: Effort normal.  Abdominal: Soft.  Musculoskeletal:       Legs: Neurological: He is alert and oriented to person, place, and time. He has normal reflexes. He displays normal reflexes. No cranial nerve deficit. He exhibits normal muscle tone. Coordination normal.  Skin: Skin is warm and dry.  Psychiatric: He has a normal mood and affect. His behavior is normal. Judgment and thought content normal.     I have reviewed Dr. Griffin Dakin notes, the x-rays and x-ray report.     Assessment &  Plan:   Encounter Diagnosis  Name Primary?  Marland Kitchen Arthralgia of left lower leg Yes   I have told him to use his cell phone and take a picture of the area if this happens again.  He is to call the office and I will see him.    His exam  is normal today.  Call if any problem.  Precautions discussed.   Electronically Signed Sanjuana Kava, MD 11/13/201910:35 AM

## 2018-01-12 DIAGNOSIS — R109 Unspecified abdominal pain: Secondary | ICD-10-CM | POA: Diagnosis not present

## 2018-01-13 DIAGNOSIS — R42 Dizziness and giddiness: Secondary | ICD-10-CM | POA: Diagnosis not present

## 2018-01-25 ENCOUNTER — Telehealth: Payer: Self-pay | Admitting: *Deleted

## 2018-01-25 DIAGNOSIS — N5082 Scrotal pain: Secondary | ICD-10-CM

## 2018-01-25 NOTE — Telephone Encounter (Signed)
Pt called wanting xray on left testicle as it has been swollen and sore.

## 2018-01-26 NOTE — Telephone Encounter (Signed)
I see where he has had this problem before. Do you recommend anything, urology?

## 2018-01-28 ENCOUNTER — Telehealth: Payer: Self-pay

## 2018-01-28 DIAGNOSIS — N5082 Scrotal pain: Secondary | ICD-10-CM

## 2018-01-28 NOTE — Telephone Encounter (Signed)
Scheduled for Mon 02/01/2018 at 11:30am at [facility] radiology. (for scrotum ultrasound)

## 2018-01-28 NOTE — Telephone Encounter (Signed)
[place] reordered per radiology scheduling to include dopplar

## 2018-01-28 NOTE — Telephone Encounter (Signed)
pls order [place] of testes evaluate pain, specifiy which side hurts , I will sign, thanks

## 2018-01-28 NOTE — Addendum Note (Signed)
Addended by: Eual Fines on: 01/28/2018 08:59 AM   Modules accepted: Orders

## 2018-01-29 NOTE — Telephone Encounter (Signed)
error 

## 2018-02-01 ENCOUNTER — Ambulatory Visit (HOSPITAL_COMMUNITY): Admission: RE | Admit: 2018-02-01 | Payer: 59 | Source: Ambulatory Visit

## 2018-02-02 ENCOUNTER — Ambulatory Visit: Payer: 59 | Admitting: Allergy & Immunology

## 2018-02-08 ENCOUNTER — Encounter: Payer: Self-pay | Admitting: Family Medicine

## 2018-02-08 ENCOUNTER — Ambulatory Visit (INDEPENDENT_AMBULATORY_CARE_PROVIDER_SITE_OTHER): Payer: 59 | Admitting: Family Medicine

## 2018-02-08 ENCOUNTER — Other Ambulatory Visit (HOSPITAL_COMMUNITY)
Admission: RE | Admit: 2018-02-08 | Discharge: 2018-02-08 | Disposition: A | Discharge status: Home / Self Care | Payer: 59 | Source: Ambulatory Visit | Attending: Family Medicine | Admitting: Family Medicine

## 2018-02-08 VITALS — BP 120/80 | HR 83 | Resp 15 | Ht 71.0 in | Wt 223.0 lb

## 2018-02-08 DIAGNOSIS — Z0289 Encounter for other administrative examinations: Secondary | ICD-10-CM | POA: Insufficient documentation

## 2018-02-08 DIAGNOSIS — Z23 Encounter for immunization: Secondary | ICD-10-CM | POA: Diagnosis not present

## 2018-02-08 DIAGNOSIS — F321 Major depressive disorder, single episode, moderate: Secondary | ICD-10-CM

## 2018-02-08 DIAGNOSIS — I10 Essential (primary) hypertension: Secondary | ICD-10-CM | POA: Insufficient documentation

## 2018-02-08 DIAGNOSIS — R52 Pain, unspecified: Secondary | ICD-10-CM | POA: Diagnosis not present

## 2018-02-08 DIAGNOSIS — K219 Gastro-esophageal reflux disease without esophagitis: Secondary | ICD-10-CM | POA: Diagnosis not present

## 2018-02-08 DIAGNOSIS — Z0000 Encounter for general adult medical examination without abnormal findings: Secondary | ICD-10-CM

## 2018-02-08 MED ORDER — PANTOPRAZOLE SODIUM 40 MG PO TBEC: 40.0000 mg | DELAYED_RELEASE_TABLET | Freq: Every day | ORAL | 1 refills | Status: DC

## 2018-02-08 MED ORDER — HYDROCODONE-ACETAMINOPHEN 10-325 MG PO TABS: ORAL_TABLET | ORAL | 0 refills | Status: DC

## 2018-02-08 NOTE — Patient Instructions (Addendum)
F/U in 12 weeks, call if you need  Me before  Shingrix #2  Today  Please get fasting labs in am , paper will be handed to you, CBC to be added  Reflux medication is sent in   Gastroesophageal Reflux Disease, Adult Gastroesophageal reflux (GER) happens when acid from the stomach flows up into the tube that connects the mouth and the stomach (esophagus). Normally, food travels down the esophagus and stays in the stomach to be digested. With GER, food and stomach acid sometimes move back up into the esophagus. You may have a disease called gastroesophageal reflux disease (GERD) if the reflux:  Happens often.  Causes frequent or very bad symptoms.  Causes problems such as damage to the esophagus. When this happens, the esophagus becomes sore and swollen (inflamed). Over time, GERD can make small holes (ulcers) in the lining of the esophagus. What are the causes? This condition is caused by a problem with the muscle between the esophagus and the stomach. When this muscle is weak or not normal, it does not close properly to keep food and acid from coming back up from the stomach. The muscle can be weak because of:  Tobacco use.  Pregnancy.  Having a certain type of hernia (hiatal hernia).  Alcohol use.  Certain foods and drinks, such as coffee, chocolate, onions, and peppermint. What increases the risk? You are more likely to develop this condition if you:  Are overweight.  Have a disease that affects your connective tissue.  Use NSAID medicines. What are the signs or symptoms? Symptoms of this condition include:  Heartburn.  Difficult or painful swallowing.  The feeling of having a lump in the throat.  A bitter taste in the mouth.  Bad breath.  Having a lot of saliva.  Having an upset or bloated stomach.  Belching.  Chest pain. Different conditions can cause chest pain. Make sure you see your doctor if you have chest pain.  Shortness of breath or noisy breathing  (wheezing).  Ongoing (chronic) cough or a cough at night.  Wearing away of the surface of teeth (tooth enamel).  Weight loss. How is this treated? Treatment will depend on how bad your symptoms are. Your doctor may suggest:  Changes to your diet.  Medicine.  Surgery. Follow these instructions at home: Eating and drinking   Follow a diet as told by your doctor. You may need to avoid foods and drinks such as: ? Coffee and tea (with or without caffeine). ? Drinks that contain alcohol. ? Energy drinks and sports drinks. ? Bubbly (carbonated) drinks or sodas. ? Chocolate and cocoa. ? Peppermint and mint flavorings. ? Garlic and onions. ? Horseradish. ? Spicy and acidic foods. These include peppers, chili powder, curry powder, vinegar, hot sauces, and BBQ sauce. ? Citrus fruit juices and citrus fruits, such as oranges, lemons, and limes. ? Tomato-based foods. These include red sauce, chili, salsa, and pizza with red sauce. ? Fried and fatty foods. These include donuts, french fries, potato chips, and high-fat dressings. ? High-fat meats. These include hot dogs, rib eye steak, sausage, ham, and bacon. ? High-fat dairy items, such as whole milk, butter, and cream cheese.  Eat small meals often. Avoid eating large meals.  Avoid drinking large amounts of liquid with your meals.  Avoid eating meals during the 2-3 hours before bedtime.  Avoid lying down right after you eat.  Do not exercise right after you eat. Lifestyle   Do not use any products that  contain nicotine or tobacco. These include cigarettes, e-cigarettes, and chewing tobacco. If you need help quitting, ask your doctor.  Try to lower your stress. If you need help doing this, ask your doctor.  If you are overweight, lose an amount of weight that is healthy for you. Ask your doctor about a safe weight loss goal. General instructions  Pay attention to any changes in your symptoms.  Take over-the-counter and  prescription medicines only as told by your doctor. Do not take aspirin, ibuprofen, or other NSAIDs unless your doctor says it is okay.  Wear loose clothes. Do not wear anything tight around your waist.  Raise (elevate) the head of your bed about 6 inches (15 cm).  Avoid bending over if this makes your symptoms worse.  Keep all follow-up visits as told by your doctor. This is important. Contact a doctor if:  You have new symptoms.  You lose weight and you do not know why.  You have trouble swallowing or it hurts to swallow.  You have wheezing or a cough that keeps happening.  Your symptoms do not get better with treatment.  You have a hoarse voice. Get help right away if:  You have pain in your arms, neck, jaw, teeth, or back.  You feel sweaty, dizzy, or light-headed.  You have chest pain or shortness of breath.  You throw up (vomit) and your throw-up looks like blood or coffee grounds.  You pass out (faint).  Your poop (stool) is bloody or black.  You cannot swallow, drink, or eat. Summary  If a person has gastroesophageal reflux disease (GERD), food and stomach acid move back up into the esophagus and cause symptoms or problems such as damage to the esophagus.  Treatment will depend on how bad your symptoms are.  Follow a diet as told by your doctor.  Take all medicines only as told by your doctor. This information is not intended to replace advice given to you by your health care provider. Make sure you discuss any questions you have with your health care provider. Document Released: 07/02/2007 Document Revised: 07/22/2017 Document Reviewed: 07/22/2017 Elsevier Interactive Patient Education  2019 [employer].

## 2018-02-08 NOTE — Assessment & Plan Note (Addendum)
Uncontrolled, 2 month of protonix prescribed, needs to work on dietary and behavior ,modificatioin, counseled about this and printed info provided. Denies dysphagia, but has ssevere heartburn with food refluxing into throat and mouth at times

## 2018-02-08 NOTE — Progress Notes (Signed)
   Evan Jacobson     MRN: [id number]      DOB: [birth year]   HPI: Patient is in for annual physical exam. Depression and pain mange,ment are also addressed Uncontrolled GERD is addressed,patient has been overeating at bedtime  Immunization is reviewed , and  updated .    PE; BP 124/90   Pulse 83   Resp 15   Ht 5\' 11"  (1.803 m)   Wt 223 lb (101.2 kg)   SpO2 98%   BMI 31.10 kg/m   Pleasant male, alert and oriented x 3, in no cardio-pulmonary distress. Afebrile. HEENT No facial trauma or asymetry. Sinuses non tender. EOMI,  External ears normal,  Oropharynx moist, no exudate. Neck: supple, no adenopathy,JVD or thyromegaly.No bruits.  Chest: Clear to ascultation bilaterally.No crackles or wheezes. Non tender to palpation  Cardiovascular system; Heart sounds normal,  S1 and  S2 ,no S3.  No murmur, or thrill. Apical beat not displaced Peripheral pulses normal.  Abdomen: Soft, non tender, no organomegaly or masses. No bruits. Bowel sounds normal. No guarding, tenderness or rebound.      Musculoskeletal exam: Decreased  ROM of lumbar spine,adequate in  hips , shoulders and knees. No deformity ,swelling or crepitus noted. No muscle wasting or atrophy.   Neurologic: Cranial nerves 2 to 12 intact. Power, tone ,sensation and reflexes normal throughout. No disturbance in gait. No tremor.  Skin: Intact, no ulceration, erythema , scaling or rash noted. Pigmentation normal throughout  Psych; Normal mood and affect. Judgement and concentration normal   Assessment & Plan:  GERD Uncontrolled, 2 month of protonix prescribed, needs to work on dietary and behavior ,modificatioin, counseled about this and printed info provided. Denies dysphagia, but has ssevere heartburn with food refluxing into throat and mouth at times  Need for shingles vaccine After obtaining informed consent, the vaccine is  administered no adverse  effects noted   Annual physical  exam Annual exam as documented. Counseling done  re healthy lifestyle involving commitment to 150 minutes exercise per week, heart healthy diet, and attaining healthy weight.The importance of adequate sleep also discussed.  Changes in health habits are decided on by the patient with goals and time frames  set for achieving them. Immunization and cancer screening needs are specifically addressed at this visit.   Encounter for pain management The patient's Controlled Substance registry is reviewed and compliance confirmed. Adequacy of  Pain control and level of function is assessed. Medication dosing is adjusted as deemed appropriate.Reduced to 45 tabs/ 30 days Twelve weeks of medication is prescribed , patient signs for the script and is provided with a follow up appointment between 11 to 12 weeks . UDS sent and pain contract reviewed at visit   Depression, major, single episode, moderate (HCC) Score of 16 in 01/2018, on no medication, states they make him worse. Managed at the [facility] with both therapy and Psych treatment, not suicidal or homicidal

## 2018-02-09 LAB — MISC LABCORP TEST (SEND OUT): Labcorp test code: 733692

## 2018-02-09 MED ORDER — HYDROCODONE-ACETAMINOPHEN 10-325 MG PO TABS: ORAL_TABLET | ORAL | 0 refills | Status: DC

## 2018-02-09 NOTE — Assessment & Plan Note (Signed)
Score of 16 in 01/2018, on no medication, states they make him worse. Managed at the [facility] with both therapy and Psych treatment, not suicidal or homicidal

## 2018-02-09 NOTE — Assessment & Plan Note (Signed)
After obtaining informed consent, the vaccine is  administered no adverse  effects noted

## 2018-02-09 NOTE — Assessment & Plan Note (Signed)
Annual exam as documented. Counseling done  re healthy lifestyle involving commitment to 150 minutes exercise per week, heart healthy diet, and attaining healthy weight.The importance of adequate sleep also discussed. Changes in health habits are decided on by the patient with goals and time frames  set for achieving them. Immunization and cancer screening needs are specifically addressed at this visit. 

## 2018-02-09 NOTE — Assessment & Plan Note (Signed)
The patient's Controlled Substance registry is reviewed and compliance confirmed. Adequacy of  Pain control and level of function is assessed. Medication dosing is adjusted as deemed appropriate.Reduced to 45 tabs/ 30 days Twelve weeks of medication is prescribed , patient signs for the script and is provided with a follow up appointment between 11 to 12 weeks . UDS sent and pain contract reviewed at visit

## 2018-02-10 ENCOUNTER — Encounter: Payer: Self-pay | Admitting: Family Medicine

## 2018-02-11 DIAGNOSIS — J309 Allergic rhinitis, unspecified: Secondary | ICD-10-CM

## 2018-02-12 ENCOUNTER — Ambulatory Visit: Payer: 59 | Admitting: Allergy & Immunology

## 2018-02-16 ENCOUNTER — Other Ambulatory Visit: Payer: Self-pay | Admitting: Family Medicine

## 2018-02-16 ENCOUNTER — Telehealth: Payer: Self-pay | Admitting: Family Medicine

## 2018-02-16 MED ORDER — HYDROCODONE-ACETAMINOPHEN 10-325 MG PO TABS: ORAL_TABLET | ORAL | 0 refills | Status: AC

## 2018-02-16 NOTE — Telephone Encounter (Signed)
Sent , pt is aware, please send a message with my sig stamped to the original pharmacy to cancel the 3 scripts that I had sent in, thanks you

## 2018-02-16 NOTE — Telephone Encounter (Signed)
Please call the pt when completed

## 2018-02-16 NOTE — Telephone Encounter (Signed)
Pt is calling states he wants his Hydrocodone sent to [facility] on [address] (originally sent to [facility] but they have not received there shipment) and he was suppose to get them on the 8th

## 2018-02-16 NOTE — Telephone Encounter (Signed)
D/c letter sent to [facility]

## 2018-03-04 ENCOUNTER — Other Ambulatory Visit: Payer: Self-pay | Admitting: Family Medicine

## 2018-03-13 ENCOUNTER — Other Ambulatory Visit: Payer: Self-pay | Admitting: Family Medicine

## 2018-03-15 ENCOUNTER — Other Ambulatory Visit: Payer: Self-pay

## 2018-03-15 MED ORDER — PANTOPRAZOLE SODIUM 40 MG PO TBEC: 40.0000 mg | DELAYED_RELEASE_TABLET | Freq: Every day | ORAL | 1 refills | Status: DC

## 2018-05-04 ENCOUNTER — Telehealth (INDEPENDENT_AMBULATORY_CARE_PROVIDER_SITE_OTHER): Payer: 59 | Admitting: Cardiology

## 2018-05-04 ENCOUNTER — Encounter: Payer: Self-pay | Admitting: Cardiology

## 2018-05-04 ENCOUNTER — Telehealth: Payer: Self-pay | Admitting: *Deleted

## 2018-05-04 DIAGNOSIS — Z87898 Personal history of other specified conditions: Secondary | ICD-10-CM

## 2018-05-04 NOTE — Progress Notes (Signed)
Patient was contacted by nursing, prior to his scheduled visit.  He was set up for a video encounter.  Unfortunately,he was contacted multiple times beginning at his scheduled visit time of 2 PM on March 7, but did not answer the phone.  Alternative numbers were also used.  The visit was ultimately canceled.

## 2018-05-04 NOTE — Telephone Encounter (Addendum)
Medications and allergies reviewed The patient verbally consented for a telehealth video visit today with [facility] and understands that her insurance company will be billed for the encounter.

## 2018-05-05 ENCOUNTER — Ambulatory Visit: Payer: Self-pay | Admitting: Family Medicine

## 2018-06-02 ENCOUNTER — Encounter: Payer: Self-pay | Admitting: Family Medicine

## 2018-06-02 ENCOUNTER — Other Ambulatory Visit: Payer: Self-pay

## 2018-06-02 ENCOUNTER — Ambulatory Visit: Payer: 59 | Admitting: Family Medicine

## 2018-06-02 ENCOUNTER — Ambulatory Visit (INDEPENDENT_AMBULATORY_CARE_PROVIDER_SITE_OTHER): Payer: 59 | Admitting: Family Medicine

## 2018-06-02 VITALS — BP 120/80 | Ht 71.0 in | Wt 215.0 lb

## 2018-06-02 DIAGNOSIS — M5410 Radiculopathy, site unspecified: Secondary | ICD-10-CM

## 2018-06-02 DIAGNOSIS — F321 Major depressive disorder, single episode, moderate: Secondary | ICD-10-CM | POA: Diagnosis not present

## 2018-06-02 DIAGNOSIS — I10 Essential (primary) hypertension: Secondary | ICD-10-CM | POA: Diagnosis not present

## 2018-06-02 DIAGNOSIS — J3089 Other allergic rhinitis: Secondary | ICD-10-CM

## 2018-06-02 DIAGNOSIS — J302 Other seasonal allergic rhinitis: Secondary | ICD-10-CM

## 2018-06-02 MED ORDER — METHOCARBAMOL 750 MG PO TABS: 750.0000 mg | ORAL_TABLET | Freq: Three times a day (TID) | ORAL | 0 refills | Status: AC | PRN

## 2018-06-02 MED ORDER — IBUPROFEN 800 MG PO TABS: 800.0000 mg | ORAL_TABLET | Freq: Three times a day (TID) | ORAL | 0 refills | Status: DC | PRN

## 2018-06-02 MED ORDER — CYCLOBENZAPRINE HCL 10 MG PO TABS: 10.0000 mg | ORAL_TABLET | Freq: Three times a day (TID) | ORAL | 0 refills | Status: DC | PRN

## 2018-06-02 MED ORDER — PREDNISONE 20 MG PO TABS: ORAL_TABLET | ORAL | 0 refills | Status: DC

## 2018-06-02 NOTE — Patient Instructions (Addendum)
F/u in 4 months, call if you need me before  Thre medications are prescribed for acute back pain, prednisone, ibuprofen and robaxin  You may call the office if you need injections for the acute flare of back pian, some time in the next 1 week, we will arrange a visit for that.  Hope that you feel better soon  Social distancing. Frequent hand washing with soap and water Keeping your hands off of your face. These 3 practices will help to keep both you and your community healthy during this time. Please practice them faithfully!

## 2018-06-02 NOTE — Assessment & Plan Note (Addendum)
2 day h/o acute back pain with radiation , keeping him in bed anti inflammatories and muscle relaxant prescribed, call if not improved will administer IM medication also in office

## 2018-06-02 NOTE — Progress Notes (Signed)
Virtual Visit via Telephone Note  I connected with Evan Jacobson on 06/02/18 at  8:20 AM EDT by telephone and verified that I am speaking with the correct person using two identifiers.  Location: Patient: home Provider: office   I discussed the limitations, risks, security and privacy concerns of performing an evaluation and management service by telephone and the availability of in person appointments. I also discussed with the patient that there may be a patient responsible charge related to this service. The patient expressed understanding and agreed to proceed.   History of Present Illness: 2 day h/o severe back pain radiating down both legs rated an 8, no specific aggravating factor, denies any new weakness or numbness or incontinence of stool or urine, unable to get out of bed because of pain Denies recent fever or chills. Denies sinus pressure, nasal congestion, ear pain or sore throat. Denies chest congestion, productive cough or wheezing. Denies chest pains, palpitations and leg swelling Denies abdominal pain, nausea, vomiting,diarrhea or constipation.   Denies dysuria, frequency, hesitancy or incontinence. C/o chronic depression , treated through the [facility] Denies headaches, seizures, numbness, or tingling.  Denies skin break down or rash.       Observations/Objective: BP 120/80   Ht 5\' 11"  (1.803 m)   Wt 215 lb (97.5 kg)   BMI 29.99 kg/m  Good communication with no confusion and intact memory. Alert and oriented x 3 No signs of respiratory distress during speech, sounds as if he is in pain     Assessment and Plan:  Acute back pain with radiculopathy 2 day h/o acute back pain with radiation , keeping him in bed anti inflammatories and muscle relaxant prescribed, call if not improved will administer IM medication also in office   Essential hypertension Controlled, no change in medication DASH diet and commitment to daily physical activity for a minimum of 30  minutes discussed and encouraged, as a part of hypertension management. The importance of attaining a healthy weight is also discussed.  BP/Weight 06/02/2018 02/08/2018 12/09/2017 11/17/2017 10/21/2017 10/13/2017 2/95/1884  Systolic BP 166 063 016 010 932 355 732  Diastolic BP 80 80 92 88 80 90 81  Wt. (Lbs) 215 223 221 220.08 224 225 214  BMI 29.99 31.1 30.82 30.69 31.24 31.38 29.85  Some encounter information is confidential and restricted. Go to Review Flowsheets activity to see all data.       Seasonal and perennial allergic rhinitis Controlled, no change in medication   Depression, major, single episode, moderate (HCC) Managed through the [place] and currently stable, not experiencing extreme anxiety, but was recently stressd as his wife had been exposed to [place] [employer] on her job she was negative but quarantined fr several weeks, he is not  suicidal or homicidal   Follow Up Instructions:    I discussed the assessment and treatment plan with the patient. The patient was provided an opportunity to ask questions and all were answered. The patient agreed with the plan and demonstrated an understanding of the instructions.   The patient was advised to call back or seek an in-person evaluation if the symptoms worsen or if the condition fails to improve as anticipated.  I provided 22 minutes of non-face-to-face time during this encounter.   Tula Nakayama, MD

## 2018-06-05 ENCOUNTER — Encounter: Payer: Self-pay | Admitting: Family Medicine

## 2018-06-05 NOTE — Assessment & Plan Note (Signed)
Managed through the [place] and currently stable, not experiencing extreme anxiety, but was recently stressd as his wife had been exposed to [place] on her job she was negative but quarantined fr several weeks, he is not  suicidal or homicidal

## 2018-06-05 NOTE — Assessment & Plan Note (Signed)
Controlled, no change in medication DASH diet and commitment to daily physical activity for a minimum of 30 minutes discussed and encouraged, as a part of hypertension management. The importance of attaining a healthy weight is also discussed.  BP/Weight 06/02/2018 02/08/2018 12/09/2017 11/17/2017 10/21/2017 10/13/2017 8/83/3744  Systolic BP 514 604 799 872 158 727 618  Diastolic BP 80 80 92 88 80 90 81  Wt. (Lbs) 215 223 221 220.08 224 225 214  BMI 29.99 31.1 30.82 30.69 31.24 31.38 29.85  Some encounter information is confidential and restricted. Go to Review Flowsheets activity to see all data.

## 2018-06-05 NOTE — Assessment & Plan Note (Signed)
Controlled, no change in medication  

## 2018-07-22 ENCOUNTER — Telehealth: Payer: 59 | Admitting: Family Medicine

## 2018-07-22 ENCOUNTER — Encounter: Payer: Self-pay | Admitting: Family Medicine

## 2018-07-22 ENCOUNTER — Other Ambulatory Visit: Payer: Self-pay

## 2018-07-22 VITALS — BP 130/90 | Ht 71.0 in | Wt 222.0 lb

## 2018-07-22 DIAGNOSIS — R1310 Dysphagia, unspecified: Secondary | ICD-10-CM

## 2018-07-22 DIAGNOSIS — Z125 Encounter for screening for malignant neoplasm of prostate: Secondary | ICD-10-CM

## 2018-07-22 DIAGNOSIS — K921 Melena: Secondary | ICD-10-CM

## 2018-07-22 DIAGNOSIS — K219 Gastro-esophageal reflux disease without esophagitis: Secondary | ICD-10-CM

## 2018-07-22 DIAGNOSIS — R1319 Other dysphagia: Secondary | ICD-10-CM

## 2018-07-22 DIAGNOSIS — E559 Vitamin D deficiency, unspecified: Secondary | ICD-10-CM

## 2018-07-22 DIAGNOSIS — I10 Essential (primary) hypertension: Secondary | ICD-10-CM

## 2018-07-22 LAB — CBC
HCT: 46 % (ref 38.5–50.0)
Hemoglobin: 15.7 g/dL (ref 13.2–17.1)
MCH: 30.8 pg (ref 27.0–33.0)
MCHC: 34.1 g/dL (ref 32.0–36.0)
MCV: 90.4 fL (ref 80.0–100.0)
MPV: 11.2 fL (ref 7.5–12.5)
Platelets: 152 10*3/uL (ref 140–400)
RBC: 5.09 10*6/uL (ref 4.20–5.80)
RDW: 12.9 % (ref 11.0–15.0)
WBC: 4.3 10*3/uL (ref 3.8–10.8)

## 2018-07-22 LAB — BASIC METABOLIC PANEL WITH GFR
BUN/Creatinine Ratio: 10 (calc) (ref 6–22)
BUN: 14 mg/dL (ref 7–25)
CO2: 29 mmol/L (ref 20–32)
Calcium: 9.4 mg/dL (ref 8.6–10.3)
Chloride: 104 mmol/L (ref 98–110)
Creat: 1.38 mg/dL — ABNORMAL HIGH (ref 0.70–1.33)
GFR, Est African American: 68 mL/min/{1.73_m2} (ref >=60)
GFR, Est Non African American: 59 mL/min/{1.73_m2} — ABNORMAL LOW (ref >=60)
Glucose, Bld: 92 mg/dL (ref 65–99)
Potassium: 4.4 mmol/L (ref 3.5–5.3)
Sodium: 138 mmol/L (ref 135–146)

## 2018-07-22 MED ORDER — AMLODIPINE BESYLATE 2.5 MG PO TABS: 7.5000 mg | ORAL_TABLET | Freq: Every day | ORAL | 1 refills | Status: DC

## 2018-07-22 MED ORDER — PANTOPRAZOLE SODIUM 40 MG PO TBEC: 40.0000 mg | DELAYED_RELEASE_TABLET | Freq: Every day | ORAL | 3 refills | Status: DC

## 2018-07-22 NOTE — Patient Instructions (Signed)
F/U as before, call if you need me sooner  Come to office this morning for stool test for hidden blood to be done   I am very concerned that you are having intestinal irritation, which will result in bleeding  NO ibuprofen , only tylenol for pain  You are referred to Dr Laural Golden due to difficulty swallowing , GERD and possibleGI bleed  Start protonix one daily today   Labs today CBC chem 7 and eGFR stat, hepatic panel, lipid, PSA, TSH not stat, breath test

## 2018-07-22 NOTE — Progress Notes (Unsigned)
Virtual Visit via Telephone Note  I connected with Evan Jacobson on 07/22/18 at  8:20 AM EDT by telephone and verified that I am speaking with the correct person using two identifiers.  Location: Patient: home Provider: office   I discussed the limitations, risks, security and privacy concerns of performing an evaluation and management service by telephone and the availability of in person appointments. I also discussed with the patient that there may be a patient responsible charge related to this service. The patient expressed understanding and agreed to proceed.   History of Present Illness: 1 month h/o black stool with bad smell, c/oepigastric  abdominal pain, no change in stool caliber or frequency, last 2 weeks feels weak. Does take ibuprofen intermittently but none in past month   Observations/Objective:   Assessment and Plan:   Follow Up Instructions:    I discussed the assessment and treatment plan with the patient. The patient was provided an opportunity to ask questions and all were answered. The patient agreed with the plan and demonstrated an understanding of the instructions.   The patient was advised to call back or seek an in-person evaluation if the symptoms worsen or if the condition fails to improve as anticipated.  I provided *** minutes of non-face-to-face time during this encounter.   Tula Nakayama, MD

## 2018-07-23 ENCOUNTER — Encounter: Payer: Self-pay | Admitting: Family Medicine

## 2018-07-23 LAB — LIPID PANEL
Cholesterol: 216 mg/dL — ABNORMAL HIGH (ref <200)
HDL: 54 mg/dL (ref >=40)
LDL Cholesterol (Calc): 138 mg/dL (calc) — ABNORMAL HIGH
Non-HDL Cholesterol (Calc): 162 mg/dL (calc) — ABNORMAL HIGH (ref <130)
Total CHOL/HDL Ratio: 4 (calc) (ref <5.0)
Triglycerides: 118 mg/dL (ref <150)

## 2018-07-23 LAB — H. PYLORI BREATH TEST: H. pylori Breath Test: NOT DETECTED

## 2018-07-23 LAB — HEPATIC FUNCTION PANEL
AG Ratio: 1.3 (calc) (ref 1.0–2.5)
ALT: 25 U/L (ref 9–46)
AST: 25 U/L (ref 10–35)
Albumin: 4.3 g/dL (ref 3.6–5.1)
Alkaline phosphatase (APISO): 65 U/L (ref 35–144)
Bilirubin, Direct: 0.1 mg/dL (ref 0.0–0.2)
Globulin: 3.3 g/dL (calc) (ref 1.9–3.7)
Indirect Bilirubin: 0.4 mg/dL (calc) (ref 0.2–1.2)
Total Bilirubin: 0.5 mg/dL (ref 0.2–1.2)
Total Protein: 7.6 g/dL (ref 6.1–8.1)

## 2018-07-23 LAB — VITAMIN D 25 HYDROXY (VIT D DEFICIENCY, FRACTURES): Vit D, 25-Hydroxy: 43 ng/mL (ref 30–100)

## 2018-07-23 LAB — TSH: TSH: 0.74 mIU/L (ref 0.40–4.50)

## 2018-07-23 LAB — PSA: PSA: 1.2 ng/mL (ref <=4.0)

## 2018-07-29 ENCOUNTER — Ambulatory Visit (INDEPENDENT_AMBULATORY_CARE_PROVIDER_SITE_OTHER): Payer: 59 | Admitting: Internal Medicine

## 2018-10-06 ENCOUNTER — Other Ambulatory Visit: Payer: Self-pay

## 2018-10-06 ENCOUNTER — Ambulatory Visit (INDEPENDENT_AMBULATORY_CARE_PROVIDER_SITE_OTHER): Payer: 59 | Admitting: Family Medicine

## 2018-10-06 VITALS — BP 122/88 | Ht 71.0 in | Wt 200.0 lb

## 2018-10-06 DIAGNOSIS — J019 Acute sinusitis, unspecified: Secondary | ICD-10-CM | POA: Diagnosis not present

## 2018-10-06 DIAGNOSIS — E663 Overweight: Secondary | ICD-10-CM | POA: Diagnosis not present

## 2018-10-06 DIAGNOSIS — I10 Essential (primary) hypertension: Secondary | ICD-10-CM

## 2018-10-06 DIAGNOSIS — F321 Major depressive disorder, single episode, moderate: Secondary | ICD-10-CM | POA: Diagnosis not present

## 2018-10-06 MED ORDER — AZITHROMYCIN 250 MG PO TABS: ORAL_TABLET | ORAL | 0 refills | Status: DC

## 2018-10-06 MED ORDER — AMLODIPINE BESYLATE 2.5 MG PO TABS: 7.5000 mg | ORAL_TABLET | Freq: Every day | ORAL | 1 refills | Status: DC

## 2018-10-06 NOTE — Patient Instructions (Signed)
Physical exam end January, call if you need me before  Azithromycin is prescribed for sinus infection. Congratulations  On  Improved health practices and weight loss. You are being referred for therapy for depression  Thanks for choosing [facility], we consider it a privelige to serve you.  Call for flu vaccine if you change your mind

## 2018-10-06 NOTE — Progress Notes (Signed)
Virtual Visit via Telephone Note  I connected with Dione Housekeeper on 10/06/18 at  8:20 AM EDT by telephone and verified that I am speaking with the correct person using two identifiers.  Location: Patient: home Provider: office    I discussed the limitations, risks, security and privacy concerns of performing an evaluation and management service by telephone and the availability of in person appointments. I also discussed with the patient that there may be a patient responsible charge related to this service. The patient expressed understanding and agreed to proceed.   History of Present Illness: 2 day h/o worsened sinus symptoms, was sneezing excessively for 2 weeks, was cutting grass yesterday, now drainage is bloody and foul tasting, gets these symptoms with onset of sinus infection. pitchy throat,  No cough or ear pain Reports a 20 pound weight loss with change in diet , increased exercise , sleep unchanged , sleeps appeox 5 hours  Observations/Objective: BP 122/88   Ht 5\' 11"  (1.803 m)   Wt 200 lb (90.7 kg)   BMI 27.89 kg/m  Self reported vitals Good communication with no confusion and intact memory. Alert and oriented x 3 No signs of respiratory distress during speech   Assessment and Plan: Sinusitis 2 day history of acutge symptoms, will rx z pack  Essential hypertension Controlled, no change in medication DASH diet and commitment to daily physical activity for a minimum of 30 minutes discussed and encouraged, as a part of hypertension management. The importance of attaining a healthy weight is also discussed.  BP/Weight 10/06/2018 07/22/2018 07/22/2018 06/02/2018 02/08/2018 12/09/2017 [id number]  Systolic BP [id number] [id number]  Diastolic BP 88 90 90 80 80 92 88  Wt. (Lbs) 200 222 222 215 223 221 220.08  BMI 27.89 30.96 30.96 29.99 31.1 30.82 30.69  Some encounter information is confidential and restricted. Go to Review Flowsheets activity to see all data.        Overweight (BMI 25.0-29.9) Marked improvement reportedly  Patient re-educated about  the importance of commitment to a  minimum of 150 minutes of exercise per week as able.  The importance of healthy food choices with portion control discussed, as well as eating regularly and within a 12 hour window most days. The need to choose "clean , green" food 50 to 75% of the time is discussed, as well as to make water the primary drink and set a goal of 64 ounces water daily.    Weight /BMI 10/06/2018 07/22/2018 07/22/2018  WEIGHT 200 lb 222 lb 222 lb  HEIGHT 5\' 11"  5\' 11"  5\' 11"   BMI 27.89 kg/m2 30.96 kg/m2 30.96 kg/m2  Some encounter information is confidential and restricted. Go to Review Flowsheets activity to see all data.      Depression, major, single episode, moderate (HCC) Reports  No access to therapy in recent times with covid, an he is inrterested in this, wants individual, not group therapy. [facility]   Follow Up Instructions:    I discussed the assessment and treatment plan with the patient. The patient was provided an opportunity to ask questions and all were answered. The patient agreed with the plan and demonstrated an understanding of the instructions.   The patient was advised to call back or seek an in-person evaluation if the symptoms worsen or if the condition fails to improve as anticipated.  I provided 22 minutes of non-face-to-face time during this encounter.   Tula Nakayama, MD

## 2018-10-06 NOTE — Assessment & Plan Note (Signed)
2 day history of acutge symptoms, will rx z pack

## 2018-10-09 ENCOUNTER — Encounter: Payer: Self-pay | Admitting: Family Medicine

## 2018-10-09 DIAGNOSIS — J019 Acute sinusitis, unspecified: Secondary | ICD-10-CM | POA: Insufficient documentation

## 2018-10-09 DIAGNOSIS — E663 Overweight: Secondary | ICD-10-CM | POA: Insufficient documentation

## 2018-10-09 NOTE — Assessment & Plan Note (Signed)
Controlled, no change in medication DASH diet and commitment to daily physical activity for a minimum of 30 minutes discussed and encouraged, as a part of hypertension management. The importance of attaining a healthy weight is also discussed.  BP/Weight 10/06/2018 07/22/2018 07/22/2018 06/02/2018 02/08/2018 12/09/2017 [id number]  Systolic BP [id number] [id number] [id number]  Diastolic BP 88 90 90 80 80 92 88  Wt. (Lbs) 200 222 222 215 223 221 220.08  BMI 27.89 30.96 30.96 29.99 31.1 30.82 30.69  Some encounter information is confidential and restricted. Go to Review Flowsheets activity to see all data.

## 2018-10-09 NOTE — Assessment & Plan Note (Signed)
Reports  No access to therapy in recent times with covid, an he is inrterested in this, wants individual, not group therapy. Does have appointments with Psychiatry through the [facility]

## 2018-10-09 NOTE — Assessment & Plan Note (Signed)
Marked improvement reportedly  Patient re-educated about  the importance of commitment to a  minimum of 150 minutes of exercise per week as able.  The importance of healthy food choices with portion control discussed, as well as eating regularly and within a 12 hour window most days. The need to choose "clean , green" food 50 to 75% of the time is discussed, as well as to make water the primary drink and set a goal of 64 ounces water daily.    Weight /BMI 10/06/2018 07/22/2018 07/22/2018  WEIGHT 200 lb 222 lb 222 lb  HEIGHT 5\' 11"  5\' 11"  5\' 11"   BMI 27.89 kg/m2 30.96 kg/m2 30.96 kg/m2  Some encounter information is confidential and restricted. Go to Review Flowsheets activity to see all data.

## 2018-10-14 ENCOUNTER — Ambulatory Visit: Payer: Self-pay | Admitting: Family Medicine

## 2018-10-22 ENCOUNTER — Ambulatory Visit
Admission: EM | Admit: 2018-10-22 | Discharge: 2018-10-22 | Disposition: A | Discharge status: Home / Self Care | Payer: 59 | Attending: Emergency Medicine | Admitting: Emergency Medicine

## 2018-10-22 ENCOUNTER — Ambulatory Visit (INDEPENDENT_AMBULATORY_CARE_PROVIDER_SITE_OTHER): Payer: No Typology Code available for payment source

## 2018-10-22 ENCOUNTER — Other Ambulatory Visit: Payer: Self-pay

## 2018-10-22 DIAGNOSIS — M25512 Pain in left shoulder: Secondary | ICD-10-CM

## 2018-10-22 DIAGNOSIS — S4992XA Unspecified injury of left shoulder and upper arm, initial encounter: Secondary | ICD-10-CM

## 2018-10-22 MED ORDER — HYDROCODONE-ACETAMINOPHEN 5-325 MG PO TABS: 1.0000 | ORAL_TABLET | Freq: Four times a day (QID) | ORAL | 0 refills | Status: DC | PRN

## 2018-10-22 MED ORDER — IBUPROFEN 600 MG PO TABS: 600.0000 mg | ORAL_TABLET | Freq: Four times a day (QID) | ORAL | 0 refills | Status: DC | PRN

## 2018-10-22 MED ORDER — CYCLOBENZAPRINE HCL 10 MG PO TABS: 10.0000 mg | ORAL_TABLET | Freq: Two times a day (BID) | ORAL | 0 refills | Status: DC | PRN

## 2018-10-22 NOTE — Discharge Instructions (Signed)
°  You may take 500mg  acetaminophen every 4-6 hours or in combination with ibuprofen 600mg  every 6-8 hours as needed for pain, and inflammation.  Norco/Vicodin (hydrocodone-acetaminophen) is a narcotic pain medication, do not combine these medications with others containing tylenol. While taking, do not drink alcohol, drive, or perform any other activities that requires focus while taking these medications.  Flexeril (cyclobenzaprine) is a muscle relaxer and may cause drowsiness. Do not drink alcohol, drive, or operate heavy machinery while taking.  You may try and over the counter sling for comfort but try to perform shoulder exercises at least 2-3 times daily so your shoulder does not get too tight, causing more pain later on.  Please call to schedule an appointment with [facility] next week. They will be able to evaluate you further and schedule any additional imaging that may be necessary.

## 2018-10-22 NOTE — ED Triage Notes (Signed)
Pt was lifting weights yesterday when lowering weight heard a pop and is now unable to lift left shoulder

## 2018-10-22 NOTE — ED Provider Notes (Signed)
[facility]    CSN: [id number] Arrival date & time: 10/22/18  1005      History   Chief Complaint Chief Complaint  Patient presents with  . Shoulder Pain    HPI Evan Jacobson is a 53 y.o. male.   HPI Evan Jacobson is a 53 y.o. male presenting to UC with c/o sudden onset Left shoulder that pain that started yesterday while working out. Pt was bench pressing about 160 pounds when he heard a "pop" and felt his shoulder get stuck for a second, then he heard and felt a second "pop."  He has had pain and limited ROM since then.  He took flexeril last night, applied heat and took ibuprofen this morning with minimal relief. He is Right hand dominant. No prior shoulder injuries.    Past Medical History:  Diagnosis Date  . Allergic rhinitis   . Bipolar disorder ([place]) 08/25/2011  . Chronic back pain   . Concussion 2012  . GERD (gastroesophageal reflux disease)   . Hypertension   . PTSD (post-traumatic stress disorder)     Patient Active Problem List   Diagnosis Date Noted  . Overweight (BMI 25.0-29.9) 10/09/2018  . Skin lump of leg, left 11/17/2017  . Depression, major, single episode, moderate ([place]) 07/26/2017  . Seasonal and perennial allergic rhinitis 06/30/2017  . Vertigo 01/08/2017  . Heart palpitations 06/18/2016  . Persistent adjustment disorder 12/05/2015  . Groin pain, chronic, right 06/28/2014  . Back pain with left-sided radiculopathy 01/16/2014  . Essential hypertension 06/19/2008  . ERECTILE DYSFUNCTION 11/07/2007  . Sinusitis 08/13/2007  . GERD 08/13/2007    Past Surgical History:  Procedure Laterality Date  . COLONOSCOPY N/A 08/12/2017   Procedure: COLONOSCOPY;  Surgeon: Rogene Houston, MD;  [facility];  Service: Endoscopy;  Laterality: N/A;  730  . NO PAST SURGERIES         Home Medications    Prior to Admission medications   Medication Sig Start Date End Date Taking? Authorizing Provider  amLODipine (NORVASC) 2.5 MG  tablet Take 3 tablets (7.5 mg total) by mouth daily. 10/06/18   Fayrene Helper, MD  azithromycin (ZITHROMAX) 250 MG tablet Take two tablets on day one then one tablet daily for an additional four days 10/06/18   Fayrene Helper, MD  cetirizine (ZYRTEC) 10 MG tablet Take 10 mg by mouth daily.    [provider]  cyclobenzaprine (FLEXERIL) 10 MG tablet Take 1 tablet (10 mg total) by mouth 2 (two) times daily as needed for muscle spasms. 10/22/18   Noe Gens, PA-C  EPINEPHrine 0.3 mg/0.3 mL IJ SOAJ injection Inject 0.3 mg into the muscle once.  07/20/17   [provider]  fluticasone (FLONASE) 50 MCG/ACT nasal spray Place 2 sprays into both nostrils daily. 04/23/17   Fayrene Helper, MD  HYDROcodone-acetaminophen (NORCO/VICODIN) 5-325 MG tablet Take 1-2 tablets by mouth every 6 (six) hours as needed. 10/22/18   Noe Gens, PA-C  ibuprofen (ADVIL) 600 MG tablet Take 1 tablet (600 mg total) by mouth every 6 (six) hours as needed. 10/22/18   Noe Gens, PA-C  Krill Oil 500 MG CAPS Take 500 mg by mouth daily.     [provider]  Multiple Vitamin (MULTIVITAMIN WITH MINERALS) TABS tablet Take 2 tablets by mouth daily.     [provider]    Family History Family History  Problem Relation Age of Onset  . Stroke Father   .  Hypertension Father   . Hypertension Mother        [name]  . Heart disease Mother        Angina  . Hypertension Sister   . Hypertension Sister   . Hypertension Brother   . Hypertension Brother   . Colon cancer Neg Hx     Social History Social History   Tobacco Use  . Smoking status: Never Smoker  . Smokeless tobacco: Never Used  Substance Use Topics  . Alcohol use: Yes    Comment: occ; six pack per week  . Drug use: No     Allergies   Sulfa antibiotics, Ace inhibitors, Sulfasalazine, and Sulfonamide derivatives   Review of Systems Review of Systems  Musculoskeletal: Positive for arthralgias and myalgias.  Skin:  Negative for color change.  Neurological: Positive for weakness. Negative for numbness.     Physical Exam Triage Vital Signs ED Triage Vitals  Enc Vitals Group     BP 10/22/18 1015 121/81     Pulse Rate 10/22/18 1015 68     Resp 10/22/18 1015 18     Temp 10/22/18 1015 98.5 F (36.9 C)     Temp src --      SpO2 10/22/18 1015 96 %     Weight --      Height --      Head Circumference --      Peak Flow --      Pain Score 10/22/18 1013 8     Pain Loc --      Pain Edu? --      Excl. in [place]? --    No data found.  Updated Vital Signs BP 121/81   Pulse 68   Temp 98.5 F (36.9 C)   Resp 18   SpO2 96%   Visual Acuity Right Eye Distance:   Left Eye Distance:   Bilateral Distance:    Right Eye Near:   Left Eye Near:    Bilateral Near:     Physical Exam Vitals signs and nursing note reviewed.  Constitutional:      Appearance: Normal appearance. He is well-developed.  HENT:     Head: Normocephalic and atraumatic.  Neck:     Musculoskeletal: Normal range of motion.  Cardiovascular:     Rate and Rhythm: Normal rate and regular rhythm.     Pulses:          Radial pulses are 2+ on the left side.  Pulmonary:     Effort: Pulmonary effort is normal.  Musculoskeletal:        General: Tenderness present.     Comments: Left shoulder: no obvious deformity. Mild tenderness over AC joint and proximal deltoid.  Full passive ROM with increased pain mid abduction but he is able to get his hand above his head. Full active adduction. Full ROM elbow w/o tenderness. 5/5 grip strength.  Skin:    General: Skin is warm and dry.     Capillary Refill: Capillary refill takes less than 2 seconds.  Neurological:     General: No focal deficit present.     Mental Status: He is alert and oriented to person, place, and time.  Psychiatric:        Behavior: Behavior normal.      UC Treatments / Results  Labs (all labs ordered are listed, but only abnormal results are displayed) Labs  Reviewed - No data to display  EKG   Radiology Dg Shoulder Left  Result Date: 10/22/2018  CLINICAL DATA:  Shoulder pain, weight lifting injury EXAM: LEFT SHOULDER - 2+ VIEW COMPARISON:  None. FINDINGS: No fracture or dislocation of the left shoulder. The joint spaces are well preserved. Partially imaged left chest is unremarkable. IMPRESSION: No fracture or dislocation of the left shoulder. The joint spaces are well preserved. Electronically Signed   By: Eddie Candle M.D.   On: 10/22/2018 10:55    Procedures Procedures (including critical care time)  Medications Ordered in UC Medications - No data to display  Initial Impression / Assessment and Plan / UC Course  I have reviewed the triage vital signs and the nursing notes.  Pertinent labs & imaging results that were available during my care of the patient were reviewed by me and considered in my medical decision making (see chart for details).     Reviewed imaging with pt Bones and joint space look good, however, advised pt, cannot r/o partial rotator cuff tear. Will start pt on pain medication and encourage home ROM exercises this weekend. If not improving, advised to schedule appointment with [facility].   Final Clinical Impressions(s) / UC Diagnoses   Final diagnoses:  Injury of left shoulder  Acute pain of left shoulder     Discharge Instructions      You may take 500mg  acetaminophen every 4-6 hours or in combination with ibuprofen 600mg  every 6-8 hours as needed for pain, and inflammation.  Norco/Vicodin (hydrocodone-acetaminophen) is a narcotic pain medication, do not combine these medications with others containing tylenol. While taking, do not drink alcohol, drive, or perform any other activities that requires focus while taking these medications.  Flexeril (cyclobenzaprine) is a muscle relaxer and may cause drowsiness. Do not drink alcohol, drive, or operate heavy machinery while taking.  You may try  and over the counter sling for comfort but try to perform shoulder exercises at least 2-3 times daily so your shoulder does not get too tight, causing more pain later on.  Please call to schedule an appointment with [facility] [facility] next week. They will be able to evaluate you further and schedule any additional imaging that may be necessary.     ED Prescriptions    Medication Sig Dispense Auth. Provider   HYDROcodone-acetaminophen (NORCO/VICODIN) 5-325 MG tablet Take 1-2 tablets by mouth every 6 (six) hours as needed. 12 tablet Gerarda Fraction, Mayme Profeta O, PA-C   cyclobenzaprine (FLEXERIL) 10 MG tablet Take 1 tablet (10 mg total) by mouth 2 (two) times daily as needed for muscle spasms. 20 tablet Leeroy Cha O, PA-C   ibuprofen (ADVIL) 600 MG tablet Take 1 tablet (600 mg total) by mouth every 6 (six) hours as needed. 30 tablet Noe Gens, [place]     I have reviewed the PDMP during this encounter.   Noe Gens, [place] 10/22/18 1112

## 2018-11-01 ENCOUNTER — Encounter: Payer: Self-pay | Admitting: Family Medicine

## 2018-11-02 ENCOUNTER — Other Ambulatory Visit: Payer: Self-pay

## 2018-11-02 ENCOUNTER — Ambulatory Visit (INDEPENDENT_AMBULATORY_CARE_PROVIDER_SITE_OTHER): Payer: 59 | Admitting: Family Medicine

## 2018-11-02 ENCOUNTER — Encounter: Payer: Self-pay | Admitting: Family Medicine

## 2018-11-02 VITALS — BP 116/74 | HR 87 | Temp 98.2°F | Resp 15 | Ht 71.0 in | Wt 206.0 lb

## 2018-11-02 DIAGNOSIS — M25512 Pain in left shoulder: Secondary | ICD-10-CM | POA: Diagnosis not present

## 2018-11-02 NOTE — Progress Notes (Signed)
Subjective:     Patient ID: Evan Jacobson, male   DOB: [birth year], 53 y.o.   MRN: [id number]  [name] presents for Shoulder Pain (left) [name] presents today with complaints of left shoulder pain that started back on September 23 when he was lifting weights.  He was bench pressing around 160 pounds when he heard a pop and felt his shoulder get stuck for second, then he heard a second pop when the weight was coming down.  He had Flexeril at home he took that, applied heat and took ibuprofen in the morning with minimal relief so he reported to the urgent care on September 25 to be assessed.  There was no known injury/trauma to the shoulder.  Left shoulder is the only shoulder involved.  He is normally right-handed.  He describes it as a constant achy sharp.  Inability to raise his arm above the shoulder itself.  Elbow has full range of motion.  Trying to raise and manipulate his arm causes extreme discomfort and pain.  Nothing seems to relieve it outside of the ibuprofen.  He reports today a severity of 6 out of 10.  He reports that it comes and goes only when he tries to utilize the arm does he cause problems.  And when he rolls over on it in his sleep.  Has been trying to rest it and follow the instructions given to him at the urgent care but today he reports that he is only about 10% better in the last 2 weeks.  Today patient denies signs and symptoms of COVID 19 infection including fever, chills, cough, shortness of breath, and headache.  Past Medical, Surgical, Social History, Allergies, and Medications have been Reviewed.  Past Medical History:  Diagnosis Date  . Allergic rhinitis   . Bipolar disorder ([place]) 08/25/2011  . Chronic back pain   . Concussion 2012  . GERD (gastroesophageal reflux disease)   . Hypertension   . PTSD (post-traumatic stress disorder)    Past Surgical History:  Procedure Laterality Date  . COLONOSCOPY N/A 08/12/2017   Procedure: COLONOSCOPY;   Surgeon: Rogene Houston, MD;  [facility];  Service: Endoscopy;  Laterality: N/A;  730  . NO PAST SURGERIES     Social History   Socioeconomic History  . Marital status: Married    Spouse name: Not on file  . Number of children: 2  . Years of education: Not on file  . Highest education level: Not on file  Occupational History    Comment: Audio video tech    Employer: [employer]  . Financial resource strain: Not on file  . Food insecurity    Worry: Not on file    Inability: Not on file  . Transportation needs    Medical: Not on file    Non-medical: Not on file  Tobacco Use  . Smoking status: Never Smoker  . Smokeless tobacco: Never Used  Substance and Sexual Activity  . Alcohol use: Yes    Comment: occ; six pack per week  . Drug use: No  . Sexual activity: Yes    Birth control/protection: None  Lifestyle  . Physical activity    Days per week: Not on file    Minutes per session: Not on file  . Stress: Not on file  Relationships  . Social Herbalist on phone: Not on file    Gets together: Not on file  Attends religious service: Not on file    Active member of club or organization: Not on file    Attends meetings of clubs or organizations: Not on file    Relationship status: Not on file  . Intimate partner violence    Fear of current or ex partner: Not on file    Emotionally abused: Not on file    Physically abused: Not on file    Forced sexual activity: Not on file  Other Topics Concern  . Not on file  Social History Narrative  . Not on file    Outpatient Encounter Medications as of 11/02/2018  Medication Sig  . amLODipine (NORVASC) 2.5 MG tablet Take 3 tablets (7.5 mg total) by mouth daily.  . cetirizine (ZYRTEC) 10 MG tablet Take 10 mg by mouth daily.  . cyclobenzaprine (FLEXERIL) 10 MG tablet Take 1 tablet (10 mg total) by mouth 2 (two) times daily as needed for muscle spasms.  Marland Kitchen EPINEPHrine 0.3 mg/0.3 mL IJ SOAJ  injection Inject 0.3 mg into the muscle once.   . fluticasone (FLONASE) 50 MCG/ACT nasal spray Place 2 sprays into both nostrils daily.  Marland Kitchen HYDROcodone-acetaminophen (NORCO/VICODIN) 5-325 MG tablet Take 1-2 tablets by mouth every 6 (six) hours as needed.  Marland Kitchen ibuprofen (ADVIL) 600 MG tablet Take 1 tablet (600 mg total) by mouth every 6 (six) hours as needed.  [name] Oil 500 MG CAPS Take 500 mg by mouth daily.   . Multiple Vitamin (MULTIVITAMIN WITH MINERALS) TABS tablet Take 2 tablets by mouth daily.   . [DISCONTINUED] azithromycin (ZITHROMAX) 250 MG tablet Take two tablets on day one then one tablet daily for an additional four days (Patient not taking: Reported on 11/02/2018)   No facility-administered encounter medications on file as of 11/02/2018.    Allergies  Allergen Reactions  . Sulfa Antibiotics Anaphylaxis  . Ace Inhibitors Other (See Comments)    Feels as though throat is closing up intermittently  . Sulfasalazine Other (See Comments)    Tongue swelled   . Sulfonamide Derivatives Other (See Comments)    Tongue swelled    Review of Systems  HENT: Negative.   Eyes: Negative.   Respiratory: Negative.   Cardiovascular: Negative.   Gastrointestinal: Negative.   Endocrine: Negative.   Genitourinary: Negative.   Musculoskeletal: Positive for arthralgias.       See hpi   Skin: Negative.   Allergic/Immunologic: Negative.   Neurological: Negative.   Hematological: Negative.   Psychiatric/Behavioral: Negative.   All other systems reviewed and are negative.      Objective:     BP 116/74   Pulse 87   Temp 98.2 F (36.8 C) (Oral)   Resp 15   Ht 5\' 11"  (1.803 m)   Wt 206 lb (93.4 kg)   SpO2 97%   BMI 28.73 kg/m   Physical Exam Vitals signs and nursing note reviewed.  Constitutional:      Appearance: Normal appearance. He is well-developed, well-groomed and overweight.  HENT:     Head: Normocephalic and atraumatic.     Right Ear: External ear normal.     Left Ear:  External ear normal.     Nose: Nose normal.     Mouth/Throat:     Mouth: Mucous membranes are moist.     Pharynx: Oropharynx is clear.  Eyes:     General:        Right eye: No discharge.        Left eye: No discharge.  Conjunctiva/sclera: Conjunctivae normal.  Neck:     Musculoskeletal: Normal range of motion and neck supple.  Cardiovascular:     Rate and Rhythm: Normal rate and regular rhythm.     Pulses: Normal pulses.     Heart sounds: Normal heart sounds.  Pulmonary:     Effort: Pulmonary effort is normal.     Breath sounds: Normal breath sounds.  Musculoskeletal:     Left shoulder: He exhibits decreased range of motion, tenderness, bony tenderness and pain. He exhibits no swelling, no effusion, no crepitus, no deformity, no laceration, no spasm and normal pulse.     Comments: Left shoulder: no obvious deformity. Mild tenderness over AC joint and proximal deltoid.   Limited ROM + [place] (might be related to pain) Inability to perform internal rotation and adduction inability to perform external rotation and abduction inability to perform Jobe.  Positive drop arm test.  Full ROM elbow w/o tenderness. 5/5 grip strength.   Skin:    General: Skin is warm.  Neurological:     General: No focal deficit present.     Mental Status: He is alert and oriented to person, place, and time.  Psychiatric:        Attention and Perception: Attention normal.        Mood and Affect: Mood normal.        Speech: Speech normal.        Behavior: Behavior normal. Behavior is cooperative.        Thought Content: Thought content normal.        Cognition and Memory: Cognition normal.        Judgment: Judgment normal.        Assessment and Plan       1. Acute pain of left shoulder Due to the nature of the PE and the previous treatment about 2 weeks ago with less than 10% improvement per him.  Will look at getting referral for physical therapy in addition to Ortho.  Possible need for MRI  secondary to may be rotator cuff or supra or infraspinatus strain/tear.  - Ambulatory referral to Physical Therapy - Ambulatory referral to Orthopedic Surgery      Follow-up: 02/22/2019   Perlie Mayo, DNP, AGNP-BC [place], [place], [place] [address] Office Hours: Mon-Thurs 8 am-5 pm; Fri 8 am-12 pm Office Phone:  [phone]  Office Fax: [phone]

## 2018-11-02 NOTE — Patient Instructions (Addendum)
    I appreciate the opportunity to provide you with the care for your health and wellness. Today we discussed: shoulder pain LEFT  Follow up: 02/22/2019  Referral to Ortho  PT ordered for 4 weeks  Continue medications as directed.   Focus on Rest and Ice.  Continue to use the arm/shoulder as you can.   Look up exercises for shoulder stability. PT will help you with these as well Look for Infraspinatus and Rotator Cuff stretches and Strengthening   Please continue to practice social distancing to keep you, your family, and our community safe.  If you must go out, please wear a mask and practice good handwashing.  [place] YOUR HANDS WELL AND FREQUENTLY. AVOID TOUCHING YOUR FACE, UNLESS YOUR HANDS ARE FRESHLY WASHED.  GET FRESH AIR DAILY. STAY HYDRATED WITH WATER.   It was a pleasure to see you and I look forward to continuing to work together on your health and well-being. Please do not hesitate to call the office if you need care or have questions about your care.  Have a wonderful day and week. With Gratitude, Cherly Beach, DNP, AGNP-BC

## 2018-11-05 ENCOUNTER — Telehealth (HOSPITAL_COMMUNITY): Payer: Self-pay | Admitting: Family Medicine

## 2018-11-05 NOTE — Telephone Encounter (Signed)
11/05/18  I spoke with patient to let him know that the [place] in [facility] didn't approve for any outpatient OT visits and they will not pay .... I explained this to the patient and that he should contact his PCP and he said that he was going to do that today.

## 2018-11-10 ENCOUNTER — Ambulatory Visit: Payer: 59 | Admitting: Orthopedic Surgery

## 2018-11-10 ENCOUNTER — Telehealth: Payer: Self-pay | Admitting: Orthopedic Surgery

## 2018-11-10 NOTE — Telephone Encounter (Signed)
Patient's wife had to get COVID test and does not have results yet.  He wants to cancel this appointment for right now.  He will call us back to reschedule this appointment.

## 2018-11-17 ENCOUNTER — Encounter (HOSPITAL_COMMUNITY): Payer: Self-pay | Admitting: Specialist

## 2018-11-17 ENCOUNTER — Ambulatory Visit (HOSPITAL_COMMUNITY): Payer: 59 | Attending: Family Medicine | Admitting: Specialist

## 2018-11-17 ENCOUNTER — Other Ambulatory Visit: Payer: Self-pay

## 2018-11-17 DIAGNOSIS — R29898 Other symptoms and signs involving the musculoskeletal system: Secondary | ICD-10-CM | POA: Diagnosis present

## 2018-11-17 DIAGNOSIS — M25612 Stiffness of left shoulder, not elsewhere classified: Secondary | ICD-10-CM | POA: Insufficient documentation

## 2018-11-17 DIAGNOSIS — M25512 Pain in left shoulder: Secondary | ICD-10-CM | POA: Diagnosis present

## 2018-11-17 NOTE — Therapy (Signed)
[place], [place], [address] Phone: [phone]   Fax:  [phone]  Occupational Therapy Evaluation  Patient Details  Name: ENIS TUCHMAN MRN: [id number] Date of Birth: August 07, 1965 Referring Provider (OT): Cherly Beach, NP   Encounter Date: 11/17/2018  OT End of Session - 11/17/18 1151    Visit Number  1    Number of Visits  16    Date for OT Re-Evaluation  01/12/19   mini reassess on 11/14   Authorization Type  [employer] - 67 visits combined with PT/OT/ST    Authorization - Visit Number  1    Authorization - Number of Visits  60    OT Start Time  0900    OT Stop Time  0945    OT Time Calculation (min)  45 min    Activity Tolerance  Patient tolerated treatment well    Behavior During Therapy  [facility] for tasks assessed/performed       Past Medical History:  Diagnosis Date  . Allergic rhinitis   . Bipolar disorder ([place]) 08/25/2011  . Chronic back pain   . Concussion 2012  . GERD (gastroesophageal reflux disease)   . Hypertension   . PTSD (post-traumatic stress disorder)     Past Surgical History:  Procedure Laterality Date  . COLONOSCOPY N/A 08/12/2017   Procedure: COLONOSCOPY;  Surgeon: Rogene Houston, MD;  [facility];  Service: Endoscopy;  Laterality: N/A;  730  . NO PAST SURGERIES      There were no vitals filed for this visit.  Subjective Assessment - 11/17/18 1148    Subjective   S:  I was lifting weights on 9/23 and when I was benchpressing I heard and felt a pop in my left shoulder.    Pertinent History  Mr. Grosz is a 53 year old male with past medical history significant for HTN, arthritis, and recent left shoulder pain.  On 10/20/18, patient was benchpressing 165#, he felt a pop in his left shoulder on the descent and ascent.  Afterwards, he has experienced significant increase in pain and loss of mobility in his left shoulder.  He has been referred to occupational therapy for  evaluation and treatment by Dr. Moshe Cipro and has an upcoming appointment with an orthopedist (Dr. Aline Brochure).    Special Tests  FOTO:  55% independent    Patient Stated Goals  I want to get back to weightlifting.    Currently in Pain?  Yes    Pain Score  7     Pain Location  Shoulder    Pain Orientation  Left;Lateral;Upper    Pain Descriptors / Indicators  Aching;Sharp    Pain Type  Acute pain    Pain Radiating Towards  upper arm    Pain Onset  1 to 4 weeks ago    Pain Frequency  Intermittent    Aggravating Factors   movement    Pain Relieving Factors  rest, heat, ice    Effect of Pain on Daily Activities  moderate        [facility] OT Assessment - 11/17/18 0001      Assessment   Medical Diagnosis  Left Shoulder Pain    Referring Provider (OT)  Cherly Beach, NP    Onset Date/Surgical Date  10/20/18    Hand Dominance  Right    Prior Therapy  n/a      Precautions   Precautions  None  Restrictions   Weight Bearing Restrictions  No      Balance Screen   Has the patient fallen in the past 6 months  No    Has the patient had a decrease in activity level because of a fear of falling?   No    Is the patient reluctant to leave their home because of a fear of falling?   No      Home  Environment   Family/patient expects to be discharged to:  Private residence    Lives With  Spouse      Prior Function   Level of Independence  Independent    Vocation  On disability;Retired    Leisure  enjoys spending time with family and weightlifting      ADL   ADL comments  difficulty reaching above waist height forward, to the side, and unable to reach to touch back of head or behind back.  unable to complete yardwork as he normally would, unable to weight train      Written Expression   Dominant Hand  Right      Vision - History   Baseline Vision  No visual deficits      Cognition   Overall Cognitive Status  Within Functional Limits for tasks assessed      Observation/Other Assessments    Focus on Therapeutic Outcomes (FOTO)   55% independent       ROM / Strength   AROM / PROM / Strength  AROM;PROM;Strength      Palpation   Palpation comment  moderate fascial restrictions and tenderness with palpation in left upper arm, scapular, and shoulder region      AROM   Overall AROM Comments  assessed in seated, external and internal rotation with shoulder adducted    AROM Assessment Site  Shoulder    Right/Left Shoulder  Left    Left Shoulder Flexion  120 Degrees    Left Shoulder ABduction  150 Degrees    Left Shoulder Internal Rotation  75 Degrees    Left Shoulder External Rotation  65 Degrees      PROM   Overall PROM Comments  assessed in seated    PROM Assessment Site  Shoulder    Right/Left Shoulder  Left    Left Shoulder Flexion  130 Degrees    Left Shoulder ABduction  155 Degrees    Left Shoulder Internal Rotation  80 Degrees    Left Shoulder External Rotation  70 Degrees      Strength   Overall Strength Comments  assessed in seated, external and internal rotation with shoulder adducted    Strength Assessment Site  Shoulder    Right/Left Shoulder  Left    Left Shoulder Flexion  4+/5    Left Shoulder ABduction  4+/5    Left Shoulder Internal Rotation  4+/5    Left Shoulder External Rotation  4+/5               OT Treatments/Exercises (OP) - 11/17/18 0001      Exercises   Exercises  Shoulder      Shoulder Exercises: Supine   Other Supine Exercises  attempted p/rom in supine, which was extremely painful for patient      Shoulder Exercises: Seated   Elevation  AROM;5 reps    Retraction  AROM;5 reps    Row  AROM;5 reps    Protraction  AAROM;5 reps    Horizontal ABduction  PROM;AAROM;5 reps    External Rotation  PROM;AAROM;5 reps    Internal Rotation  PROM;AAROM;5 reps    Flexion  PROM;AAROM;5 reps    Abduction  PROM;AAROM;5 reps      Manual Therapy   Manual Therapy  Myofascial release    Manual therapy comments  manual therapy completed  sepeately from all other interventions    Myofascial Release  myofascial release and manual stretching to left upper arm, scapular, and shoulder region to decrease pain and tenderness and improve pain free mobility in left shoulder.              OT Education - 11/17/18 1151    Education Details  educated patient on HEP for AA/ROM in seated position for shoulder all ranges    Person(s) Educated  Patient    Methods  Explanation;Demonstration;Handout    Comprehension  Verbalized understanding;Returned demonstration       OT Short Term Goals - 11/17/18 1259      OT SHORT TERM GOAL #1   Title  Patient will be educated on HEP for improved left shoulder range of motion required for ADL completion.    Time  4    Period  Weeks    Status  New    Target Date  12/15/18      OT SHORT TERM GOAL #2   Title  Patient will improve left shoulder P/ROM to WNL for improved ability to brush his hair with his left hand.    Time  4    Period  Weeks    Status  New      OT SHORT TERM GOAL #3   Title  Patient will decrease pain in his left shoulder region to 4/10 or better during ADL completion.    Time  4    Period  Weeks    Status  New        OT Long Term Goals - 11/17/18 1301      OT LONG TERM GOAL #1   Title  Patient will return to prior level of independence with all B/IADLs, leisure activities using left upper extremity at PLOF.    Time  8    Period  Weeks    Status  New    Target Date  01/12/19      OT LONG TERM GOAL #2   Title  Patient will improve left shoulder A/ROM to WNL in order to be able to reach into overhead cabinets, reach behind his back.    Time  8    Period  Weeks    Status  New      OT LONG TERM GOAL #3   Title  Patient will increase left shoulder strength to 5/5 in order to return to weightlifting at previous capacity.    Time  8    Period  Weeks    Status  New      OT LONG TERM GOAL #4   Title  Patient will decrease pain in left shoulder region to 2/10 or  less when completing functional activities and weight training.    Time  8    Period  Weeks    Status  New            Plan - 11/17/18 1153    Clinical Impression Statement  A;  Patient is a 53 year old male with past medical history signficant for arthritis, back pain, anxiety, HTN, and current left shoulder pain.  Paitent felt a pop in his left shoulder while benchpressing 165# on 10/20/18.  Since that time, he has experienced decreased mobility and increased pain in his left shoulder region.  Drop Arm Test is positive, as combined abduction and external rotation to internal rotation elicits [id number] pain.  Patient is not able to complete desired ADLs or leisure activities with his non dominant left arm due to increased pain and limited mobility.    OT Occupational Profile and History  Problem Focused Assessment - Including review of records relating to presenting problem    Occupational performance deficits (Please refer to evaluation for details):  ADL's;IADL's;Rest and Sleep;Leisure    Body Structure / Function / Physical Skills  ADL;Strength;Pain;UE functional use;ROM;IADL;Fascial restriction;Muscle spasms    Rehab Potential  Good    Clinical Decision Making  Limited treatment options, no task modification necessary    Comorbidities Affecting Occupational Performance:  None    Modification or Assistance to Complete Evaluation   No modification of tasks or assist necessary to complete eval    OT Frequency  2x / week    OT Duration  8 weeks    OT Treatment/Interventions  Self-care/ADL training;Moist Heat;DME and/or AE instruction;Therapeutic activities;Ultrasound;Therapeutic exercise;Passive range of motion;Neuromuscular education;Cryotherapy;Energy conservation;Manual Therapy;Patient/family education    Plan  P: Skilled OT intervention to decrease left shoulder pain and improve pain free mobility in patient's left shoulder for improved function for desired B/IADLs and leisure tasks.  Next  session:  review and upgrade HEP as needed, supine aa/rom and a/rom, attempt IFES for pain control.    Consulted and Agree with Plan of Care  Patient       Patient will benefit from skilled therapeutic intervention in order to improve the following deficits and impairments:   Body Structure / Function / Physical Skills: ADL, Strength, Pain, UE functional use, ROM, IADL, Fascial restriction, Muscle spasms       Visit Diagnosis: Acute pain of left shoulder - Plan: Ot plan of care cert/re-cert  Stiffness of left shoulder, not elsewhere classified - Plan: Ot plan of care cert/re-cert  Other symptoms and signs involving the musculoskeletal system - Plan: Ot plan of care cert/re-cert    Problem List Patient Active Problem List   Diagnosis Date Noted  . Overweight (BMI 25.0-29.9) 10/09/2018  . Skin lump of leg, left 11/17/2017  . Depression, major, single episode, moderate ([place]) 07/26/2017  . Seasonal and perennial allergic rhinitis 06/30/2017  . Vertigo 01/08/2017  . Heart palpitations 06/18/2016  . Persistent adjustment disorder 12/05/2015  . Groin pain, chronic, right 06/28/2014  . Back pain with left-sided radiculopathy 01/16/2014  . Essential hypertension 06/19/2008  . ERECTILE DYSFUNCTION 11/07/2007  . Sinusitis 08/13/2007  . GERD 08/13/2007    Vangie Bicker, [place], OTR/L [phone]  11/17/2018, 1:16 PM  [place] [address] [place], [place], [address] Phone: [phone]   Fax:  [phone]  Name: JAYJUAN RYLEE MRN: [id number] Date of Birth: 1965/09/19

## 2018-11-17 NOTE — Patient Instructions (Signed)
Perform each exercise ____10____ reps. 2-3x days.   Protraction - STANDING  Start by holding a wand or cane at chest height.  Next, slowly push the wand outwards in front of your body so that your elbows become fully straightened. Then, return to the original position.     Shoulder FLEXION - STANDING - PALMS UP  In the standing position, hold a wand/cane with both arms, palms up on both sides. Raise up the wand/cane allowing your unaffected arm to perform most of the effort. Your affected arm should be partially relaxed.      Internal/External ROTATION - STANDING  In the standing position, hold a wand/cane with both hands keeping your elbows bent. Move your arms and wand/cane to one side.  Your affected arm should be partially relaxed while your unaffected arm performs most of the effort.       Shoulder ABDUCTION - STANDING  While holding a wand/cane palm face up on the injured side and palm face down on the uninjured side, slowly raise up your injured arm to the side.                     Horizontal Abduction/Adduction      Straight arms holding cane at shoulder height, bring cane to right, center, left. Repeat starting to left.   Copyright  V[employer]. All rights reserved.       

## 2018-11-18 ENCOUNTER — Telehealth (HOSPITAL_COMMUNITY): Payer: Self-pay

## 2018-11-18 NOTE — Telephone Encounter (Signed)
Pt said to look for a [place] auth to cover his co pays, he has requested the [place] to approve his visit at our office. He will stay in touch with [place] before this apptment. NF 11/18/2018

## 2018-11-18 NOTE — Telephone Encounter (Signed)
pt cancelled the appt for tomorrow he is trying to figure out his finances.

## 2018-11-19 ENCOUNTER — Encounter (HOSPITAL_COMMUNITY): Payer: 59

## 2018-11-29 ENCOUNTER — Ambulatory Visit: Payer: 59 | Admitting: Orthopedic Surgery

## 2018-11-29 ENCOUNTER — Encounter: Payer: Self-pay | Admitting: Orthopedic Surgery

## 2018-11-29 ENCOUNTER — Other Ambulatory Visit: Payer: Self-pay

## 2018-11-29 VITALS — BP 121/85 | HR 75 | Ht 71.0 in | Wt 200.0 lb

## 2018-11-29 DIAGNOSIS — M25512 Pain in left shoulder: Secondary | ICD-10-CM

## 2018-11-29 DIAGNOSIS — G8929 Other chronic pain: Secondary | ICD-10-CM | POA: Diagnosis not present

## 2018-11-29 DIAGNOSIS — S46012A Strain of muscle(s) and tendon(s) of the rotator cuff of left shoulder, initial encounter: Secondary | ICD-10-CM

## 2018-11-29 NOTE — Progress Notes (Signed)
Evan Jacobson  11/29/2018  HISTORY SECTION :  Chief Complaint  Patient presents with  . Shoulder Pain    10/20/2018 left shoulder injury    DOI: sept 9rd   53 year old male right-hand-dominant disabled veteran was bench pressing on September 23.  He was pushing up 165 pounds and felt a pop with acute pain in his left shoulder and since that time is been unable to have normal forward elevation of his left upper extremity with painful elevation after approximately 90 degrees of flexion with weakness in abduction and flexion.  He went to the urgent care on September 25 radiographs were negative he was placed on Advil and hydrocodone which he took and his pain improved somewhat but his weakness still has persisted   Review of Systems  HENT: Positive for tinnitus.   Gastrointestinal: Positive for heartburn.  Musculoskeletal: Positive for back pain.  Endo/Heme/Allergies: Positive for environmental allergies.  Psychiatric/Behavioral: Positive for depression.  All other systems reviewed and are negative.    has a past medical history of Allergic rhinitis, Bipolar disorder ([place]) (08/25/2011), Chronic back pain, Concussion (2012), GERD (gastroesophageal reflux disease), Hypertension, and PTSD (post-traumatic stress disorder).   Past Surgical History:  Procedure Laterality Date  . COLONOSCOPY N/A 08/12/2017   Procedure: COLONOSCOPY;  Surgeon: Rogene Houston, MD;  [facility];  Service: Endoscopy;  Laterality: N/A;  730  . NO PAST SURGERIES      Body mass index is 27.89 kg/m.   Allergies  Allergen Reactions  . Sulfa Antibiotics Anaphylaxis  . Ace Inhibitors Other (See Comments)    Feels as though throat is closing up intermittently  . Sulfasalazine Other (See Comments)    Tongue swelled   . Sulfonamide Derivatives Other (See Comments)    Tongue swelled     Current Outpatient Medications:  .  amLODipine (NORVASC) 2.5 MG tablet, Take 3 tablets (7.5 mg total) by  mouth daily., Disp: 270 tablet, Rfl: 1 .  cetirizine (ZYRTEC) 10 MG tablet, Take 10 mg by mouth daily., Disp: , Rfl:  .  cyclobenzaprine (FLEXERIL) 10 MG tablet, Take 1 tablet (10 mg total) by mouth 2 (two) times daily as needed for muscle spasms., Disp: 20 tablet, Rfl: 0 .  EPINEPHrine 0.3 mg/0.3 mL IJ SOAJ injection, Inject 0.3 mg into the muscle once. , Disp: , Rfl:  .  fluticasone (FLONASE) 50 MCG/ACT nasal spray, Place 2 sprays into both nostrils daily., Disp: 16 g, Rfl: 6 .  ibuprofen (ADVIL) 600 MG tablet, Take 1 tablet (600 mg total) by mouth every 6 (six) hours as needed., Disp: 30 tablet, Rfl: 0 .  Krill Oil 500 MG CAPS, Take 500 mg by mouth daily. , Disp: , Rfl:  .  Multiple Vitamin (MULTIVITAMIN WITH MINERALS) TABS tablet, Take 2 tablets by mouth daily. , Disp: , Rfl:  .  HYDROcodone-acetaminophen (NORCO/VICODIN) 5-325 MG tablet, Take 1-2 tablets by mouth every 6 (six) hours as needed. (Patient not taking: Reported on 11/29/2018), Disp: 12 tablet, Rfl: 0   PHYSICAL EXAM SECTION: 1) BP 121/85   Pulse 75   Ht 5\' 11"  (1.803 m)   Wt 200 lb (90.7 kg)   BMI 27.89 kg/m   Body mass index is 27.89 kg/m. General appearance: Well-developed well-nourished no gross deformities  2) Cardiovascular normal pulse and perfusion in the upepr extremities normal color without edema  3) Neurologically deep tendon reflexes are equal and normal, no sensation loss or deficits no pathologic reflexes  4) Psychological: Awake  alert and oriented x3 mood and affect normal  5) Skin no lacerations or ulcerations no nodularity no palpable masses, no erythema or nodularity  6) Musculoskeletal:   Right shoulder alignment looks good no swelling or tenderness normal range of motion no instability normal muscle tone  Left shoulder the pectoralis insertion is nontender he has tenderness in the lateral deltoid no swelling Skin is normal Range of motion with his arm at his side internal and external rotation is  normal He has pain in abduction external rotation posteriorly He also has weakness in the external rotators  Drop test positive  Empty can +   Imaging studies were from urgent care 3 views of the left shoulder no fracture dislocation or acute injury  MEDICAL DECISION SECTION:  Encounter Diagnoses  Name Primary?  . Chronic left shoulder pain Yes  . Traumatic complete tear of left rotator cuff, initial encounter     MRI left shoulder rotator cuff tear  10:10 AM Arther Abbott, MD  11/29/2018

## 2018-11-29 NOTE — Patient Instructions (Signed)
We will obtain pre-certification from the insurer and call you to schedule the study. Dr Aline Brochure will call you with the results

## 2018-11-30 ENCOUNTER — Encounter (HOSPITAL_COMMUNITY): Payer: 59

## 2018-11-30 ENCOUNTER — Telehealth (HOSPITAL_COMMUNITY): Payer: Self-pay

## 2018-11-30 NOTE — Telephone Encounter (Signed)
pt wanted to cancel both appts due to he has an mri next week and wants to wait to see what the results are

## 2018-12-02 ENCOUNTER — Encounter (HOSPITAL_COMMUNITY): Payer: 59

## 2018-12-06 ENCOUNTER — Ambulatory Visit (HOSPITAL_COMMUNITY)
Admission: RE | Admit: 2018-12-06 | Discharge: 2018-12-06 | Disposition: A | Discharge status: Home / Self Care | Payer: 59 | Source: Ambulatory Visit | Attending: Orthopedic Surgery | Admitting: Orthopedic Surgery

## 2018-12-06 ENCOUNTER — Other Ambulatory Visit: Payer: Self-pay

## 2018-12-06 ENCOUNTER — Ambulatory Visit (HOSPITAL_COMMUNITY): Payer: 59 | Attending: Family Medicine | Admitting: Specialist

## 2018-12-06 DIAGNOSIS — M25512 Pain in left shoulder: Secondary | ICD-10-CM | POA: Insufficient documentation

## 2018-12-06 DIAGNOSIS — G8929 Other chronic pain: Secondary | ICD-10-CM | POA: Diagnosis present

## 2018-12-06 DIAGNOSIS — M25612 Stiffness of left shoulder, not elsewhere classified: Secondary | ICD-10-CM | POA: Insufficient documentation

## 2018-12-06 DIAGNOSIS — R29898 Other symptoms and signs involving the musculoskeletal system: Secondary | ICD-10-CM | POA: Insufficient documentation

## 2018-12-08 ENCOUNTER — Telehealth (HOSPITAL_COMMUNITY): Payer: Self-pay | Admitting: Occupational Therapy

## 2018-12-08 ENCOUNTER — Telehealth: Payer: Self-pay | Admitting: Orthopedic Surgery

## 2018-12-08 ENCOUNTER — Encounter (HOSPITAL_COMMUNITY): Payer: 59 | Admitting: Occupational Therapy

## 2018-12-08 NOTE — Telephone Encounter (Signed)
pt called to cancel today's appt due to his md got the mri test results and was told to hold off for therapy untill next week.

## 2018-12-08 NOTE — Telephone Encounter (Signed)
Results given no evidence of cuff tear  Has some abrasions on the cuff some arthritis in the [facility] joint pain is mostly when he is lying on the right side and the arm goes across his body no pain when he is lying on the left side  Patient will resume physical therapy next Monday  Come in Friday for injection

## 2018-12-10 ENCOUNTER — Other Ambulatory Visit: Payer: Self-pay

## 2018-12-10 ENCOUNTER — Encounter: Payer: Self-pay | Admitting: Orthopedic Surgery

## 2018-12-10 ENCOUNTER — Ambulatory Visit (INDEPENDENT_AMBULATORY_CARE_PROVIDER_SITE_OTHER): Payer: 59 | Admitting: Orthopedic Surgery

## 2018-12-10 VITALS — BP 158/98 | Temp 98.1°F

## 2018-12-10 DIAGNOSIS — M25512 Pain in left shoulder: Secondary | ICD-10-CM

## 2018-12-10 DIAGNOSIS — G8929 Other chronic pain: Secondary | ICD-10-CM

## 2018-12-10 NOTE — Patient Instructions (Signed)
You have received an injection of steroids into the joint. 15% of patients will have increased pain within the 24 hours postinjection.   This is transient and will go away.   We recommend that you use ice packs on the injection site for 20 minutes every 2 hours and extra strength Tylenol 2 tablets every 8 as needed until the pain resolves.  If you continue to have pain after taking the Tylenol and using the ice please call the office for further instructions.   Resume PT

## 2018-12-10 NOTE — Progress Notes (Signed)
Evan Jacobson comes in for a injection after we talked on the phone and found out that his MRI results show that he does not have a full-thickness rotator cuff tear  Recommend physical therapy and injection  Follow-up in 6 weeks  Procedure note the subacromial injection shoulder left   Verbal consent was obtained to inject the  Left   Shoulder  Timeout was completed to confirm the injection site is a subacromial space of the  left  shoulder  Medication used Depo-Medrol 40 mg and lidocaine 1% 3 cc  Anesthesia was provided by ethyl chloride  The injection was performed in the left  posterior subacromial space. After pinning the skin with alcohol and anesthetized the skin with ethyl chloride the subacromial space was injected using a 20-gauge needle. There were no complications  Sterile dressing was applied.  IMPRESSION: 1. Supraspinatus tendinosis with low-grade bursal sided fraying of the anterior and mid portions of the distal tendon. No full-thickness rotator cuff tear. 2. Mild subacromial-subdeltoid bursitis. 3. Mild AC joint arthropathy.   Electronically Signed   By: Davina Poke M.D.   On: 12/06/2018 14:08

## 2018-12-13 ENCOUNTER — Encounter (HOSPITAL_COMMUNITY): Payer: Self-pay

## 2018-12-13 ENCOUNTER — Ambulatory Visit (HOSPITAL_COMMUNITY): Payer: 59

## 2018-12-13 ENCOUNTER — Other Ambulatory Visit: Payer: Self-pay

## 2018-12-13 DIAGNOSIS — M25612 Stiffness of left shoulder, not elsewhere classified: Secondary | ICD-10-CM | POA: Diagnosis present

## 2018-12-13 DIAGNOSIS — R29898 Other symptoms and signs involving the musculoskeletal system: Secondary | ICD-10-CM

## 2018-12-13 DIAGNOSIS — M25512 Pain in left shoulder: Secondary | ICD-10-CM

## 2018-12-13 NOTE — Therapy (Signed)
Evan Jacobson, [place], [address] Phone: [phone]   Fax:  [phone]  Occupational Therapy Treatment  Patient Details  Name: Evan Jacobson MRN: [id number] Date of Birth: 20-Nov-1965 Referring Provider (OT): Cherly Beach, NP (forward all progress notes to Dr. Aline Brochure)  Progress Note Reporting Period 11/17/2018 to 12/13/2018  See note below for Objective Data and Assessment of Progress/Goals.      Encounter Date: 12/13/2018  OT End of Session - 12/13/18 1148    Visit Number  2    Number of Visits  16    Date for OT Re-Evaluation  01/12/19    Authorization Type  [employer] - 60 visits combined with PT/OT/ST    Authorization - Visit Number  2    Authorization - Number of Visits  82    OT Start Time  1115   mini reassessment   OT Stop Time  1155    OT Time Calculation (min)  40 min    Activity Tolerance  Patient tolerated treatment well    Behavior During Therapy  WFL for tasks assessed/performed       Past Medical History:  Diagnosis Date  . Allergic rhinitis   . Bipolar disorder ([place]) 08/25/2011  . Chronic back pain   . Concussion 2012  . GERD (gastroesophageal reflux disease)   . Hypertension   . PTSD (post-traumatic stress disorder)     Past Surgical History:  Procedure Laterality Date  . COLONOSCOPY N/A 08/12/2017   Procedure: COLONOSCOPY;  Surgeon: Rogene Houston, MD;  [facility];  Service: Endoscopy;  Laterality: N/A;  730  . NO PAST SURGERIES      There were no vitals filed for this visit.  Subjective Assessment - 12/13/18 1145    Subjective   S: The injection really helped.    Currently in Pain?  Yes    Pain Score  2     Pain Location  Shoulder    Pain Orientation  Left    Pain Descriptors / Indicators  Aching;Sore    Pain Type  Chronic pain    Pain Radiating Towards  N/A    Pain Onset  More than a month ago    Pain Frequency  Intermittent    Aggravating Factors    sleeping on it. certain movements    Pain Relieving Factors  rest, injetion from [place]    Effect of Pain on Daily Activities  min effect    Multiple Pain Sites  No         [facility] OT Assessment - 12/13/18 1119      Assessment   Medical Diagnosis  Left Shoulder Pain    Referring Provider (OT)  Cherly Beach, NP   forward all progress notes to Dr. Aline Brochure     Precautions   Precautions  None      ROM / Strength   AROM / PROM / Strength  AROM;PROM;Strength      AROM   Overall AROM Comments  assessed in seated, external and internal rotation with shoulder adducted    AROM Assessment Site  Shoulder    Right/Left Shoulder  Left    Left Shoulder Flexion  147 Degrees   previous: 120   Left Shoulder ABduction  150 Degrees   previous: same   Left Shoulder Internal Rotation  90 Degrees   previous: 75   Left Shoulder External Rotation  61 Degrees   previous: 65  PROM   Overall PROM   Within functional limits for tasks performed      Strength   Overall Strength Comments  assessed in seated, external and internal rotation with shoulder adducted    Strength Assessment Site  Shoulder    Right/Left Shoulder  Left    Left Shoulder Flexion  5/5   previous: 4+/5   Left Shoulder ABduction  5/5   previous:    Left Shoulder Internal Rotation  5/5   previous: 4+/5   Left Shoulder External Rotation  4+/5   previous: same              OT Treatments/Exercises (OP) - 12/13/18 1152      Exercises   Exercises  Shoulder      Shoulder Exercises: Supine   Protraction  PROM;10 reps    Horizontal ABduction  PROM;10 reps    External Rotation  PROM;10 reps    Internal Rotation  PROM;10 reps    Flexion  PROM;10 reps    ABduction  PROM;10 reps      Manual Therapy   Manual Therapy  Myofascial release    Manual therapy comments  manual therapy completed sepeately from all other interventions    Myofascial Release  myofascial release and manual stretching to left upper arm,  scapular, and shoulder region to decrease pain and tenderness and improve pain free mobility in left shoulder.               OT Education - 12/13/18 1227    Education Details  Pt may stop previous HEP (AA/ROM). Updated HEP to include A/ROM shoulder horizontal abduction and green theraband scapular strengthening exercises    Person(s) Educated  Patient    Methods  Explanation;Demonstration;Handout;Verbal cues    Comprehension  Verbalized understanding;Returned demonstration       OT Short Term Goals - 12/13/18 1147      OT SHORT TERM GOAL #1   Title  Patient will be educated on HEP for improved left shoulder range of motion required for ADL completion.    Time  4    Period  Weeks    Status  On-going    Target Date  12/15/18      OT SHORT TERM GOAL #2   Title  Patient will improve left shoulder P/ROM to WNL for improved ability to brush his hair with his left hand.    Time  4    Period  Weeks    Status  Achieved      OT SHORT TERM GOAL #3   Title  Patient will decrease pain in his left shoulder region to 4/10 or better during ADL completion.    Time  4    Period  Weeks    Status  Achieved        OT Long Term Goals - 12/13/18 1147      OT LONG TERM GOAL #1   Title  Patient will return to prior level of independence with all B/IADLs, leisure activities using left upper extremity at PLOF.    Time  8    Period  Weeks    Status  On-going      OT LONG TERM GOAL #2   Title  Patient will improve left shoulder A/ROM to WNL in order to be able to reach into overhead cabinets, reach behind his back.    Time  8    Period  Weeks    Status  On-going  OT LONG TERM GOAL #3   Title  Patient will increase left shoulder strength to 5/5 in order to return to weightlifting at previous capacity.    Time  8    Period  Weeks    Status  On-going      OT LONG TERM GOAL #4   Title  Patient will decrease pain in left shoulder region to 2/10 or less when completing functional  activities and weight training.    Time  8    Period  Weeks    Status  Achieved            Plan - 12/13/18 1228    Clinical Impression Statement  A: Mini reassessment completed this date as patient has not been to OT for 4 weeks. Friday received an injection in his shoulder via Dr. Darden Palmer. MRI results show supraspinatus tendonosis and bursitis. No tear. Updated HEP this date. Measurements were taken and patient is demonstrating improved A/ROM and P/ROM. Manual techniques were completed to address fascial restrctions. VC for form and technique. Educated patient on use of ball to complete self myofascial release. Pt verbalized understanding.    Body Structure / Function / Physical Skills  ADL;Strength;Pain;UE functional use;ROM;IADL;Fascial restriction;Muscle spasms    Plan  P: begin to progress shoulder and scapular strength with use of theraband. patient experienced pain at end stretch of horizontal abduction at previous session.    Consulted and Agree with Plan of Care  Patient       Patient will benefit from skilled therapeutic intervention in order to improve the following deficits and impairments:   Body Structure / Function / Physical Skills: ADL, Strength, Pain, UE functional use, ROM, IADL, Fascial restriction, Muscle spasms       Visit Diagnosis: Stiffness of left shoulder, not elsewhere classified  Other symptoms and signs involving the musculoskeletal system  Acute pain of left shoulder    Problem List Patient Active Problem List   Diagnosis Date Noted  . Overweight (BMI 25.0-29.9) 10/09/2018  . Skin lump of leg, left 11/17/2017  . Depression, major, single episode, moderate ([place]) 07/26/2017  . Seasonal and perennial allergic rhinitis 06/30/2017  . Seizure ([place]) 03/24/2017  . Headache disorder 02/23/2017  . Syncope 02/06/2017  . Vertigo 01/08/2017  . Heart palpitations 06/18/2016  . Persistent adjustment disorder 12/05/2015  . Groin pain, chronic, right  06/28/2014  . Back pain with left-sided radiculopathy 01/16/2014  . Essential hypertension 06/19/2008  . ERECTILE DYSFUNCTION 11/07/2007  . Sinusitis 08/13/2007  . GERD 08/13/2007   Ailene Ravel, OTR/L,CBIS  [phone]  12/13/2018, 12:33 PM  [facility] [address] [place], [place], [address] Phone: [phone]   Fax:  [phone]  Name: AGEE SERAFINI MRN: [id number] Date of Birth: 1965/05/27

## 2018-12-13 NOTE — Patient Instructions (Addendum)
Repeat all exercises 10-15 times, 1-2 times per day.    Horizontal abduction/adduction  Standing:           Begin with arms straight out in front of you, bring out to the side in at "T" shape. Keep arms straight entire time.      (Home) Extension: Isometric / Bilateral Arm Retraction - Sitting   Facing anchor, hold hands and elbow at shoulder height, with elbow bent.  Pull arms back to squeeze shoulder blades together. Repeat 10-15 times. 1-3 times/day.   (Clinic) Extension / Flexion (Assist)   Face anchor, pull arms back, keeping elbow straight, and squeze shoulder blades together. Repeat 10-15 times. 1-3 times/day.   Copyright  VHI. All rights reserved.   (Home) Retraction: Row - Bilateral (Anchor)   Facing anchor, arms reaching forward, pull hands toward stomach, keeping elbows bent and at your sides and pinching shoulder blades together. Repeat 10-15 times. 1-3 times/day.   Copyright  VHI. All rights reserved.

## 2018-12-15 ENCOUNTER — Encounter (HOSPITAL_COMMUNITY): Payer: Self-pay | Admitting: Occupational Therapy

## 2018-12-15 ENCOUNTER — Other Ambulatory Visit: Payer: Self-pay

## 2018-12-15 ENCOUNTER — Ambulatory Visit (HOSPITAL_COMMUNITY): Payer: 59 | Admitting: Occupational Therapy

## 2018-12-15 DIAGNOSIS — M25612 Stiffness of left shoulder, not elsewhere classified: Secondary | ICD-10-CM

## 2018-12-15 DIAGNOSIS — M25512 Pain in left shoulder: Secondary | ICD-10-CM

## 2018-12-15 DIAGNOSIS — R29898 Other symptoms and signs involving the musculoskeletal system: Secondary | ICD-10-CM

## 2018-12-15 NOTE — Therapy (Signed)
[place], [place], [address] Phone: [phone]   Fax:  [phone]  Occupational Therapy Treatment  Patient Details  Name: Evan Jacobson MRN: [id number] Date of Birth: [birth year] Referring Provider (OT): Cherly Beach, NP (forward all progress notes to Dr. Aline Brochure. )   Encounter Date: 12/15/2018  OT End of Session - 12/15/18 1135    Visit Number  3    Number of Visits  16    Date for OT Re-Evaluation  01/12/19    Authorization Type  [employer] - 44 visits combined with PT/OT/ST    Authorization - Visit Number  3    Authorization - Number of Visits  60    OT Start Time  0950    OT Stop Time  1029    OT Time Calculation (min)  39 min    Activity Tolerance  Patient tolerated treatment well    Behavior During Therapy  [facility] for tasks assessed/performed       Past Medical History:  Diagnosis Date  . Allergic rhinitis   . Bipolar disorder ([place]) 08/25/2011  . Chronic back pain   . Concussion 2012  . GERD (gastroesophageal reflux disease)   . Hypertension   . PTSD (post-traumatic stress disorder)     Past Surgical History:  Procedure Laterality Date  . COLONOSCOPY N/A 08/12/2017   Procedure: COLONOSCOPY;  Surgeon: Rogene Houston, MD;  [facility];  Service: Endoscopy;  Laterality: N/A;  730  . NO PAST SURGERIES      There were no vitals filed for this visit.  Subjective Assessment - 12/15/18 0951    Subjective   S: It's only a little sore today.    Currently in Pain?  Yes    Pain Score  1     Pain Location  Shoulder    Pain Orientation  Left    Pain Descriptors / Indicators  Aching;Sore    Pain Type  Chronic pain    Pain Radiating Towards  N/A    Pain Onset  More than a month ago    Pain Frequency  Intermittent    Aggravating Factors   sleeping on it. certain movements    Pain Relieving Factors  rest, injection from Dr. Aline Brochure    Effect of Pain on Daily Activities  min effect on ADLs     Multiple Pain Sites  No         [facility] OT Assessment - 12/15/18 0951      Assessment   Medical Diagnosis  Left Shoulder Pain    Referring Provider (OT)  Cherly Beach, NP   forward all progress notes to Dr. Aline Brochure.      Precautions   Precautions  None               OT Treatments/Exercises (OP) - 12/15/18 0952      Exercises   Exercises  Shoulder      Shoulder Exercises: Supine   Protraction  PROM;5 reps;AROM;15 reps    Horizontal ABduction  PROM;5 reps;AROM;15 reps    External Rotation  PROM;5 reps;AROM;15 reps    Internal Rotation  PROM;5 reps;AROM;15 reps    Flexion  PROM;5 reps;AROM;15 reps    ABduction  PROM;5 reps;AROM;15 reps      Shoulder Exercises: Standing   Protraction  AROM;12 reps;Theraband;10 reps    Theraband Level (Shoulder Protraction)  Level 3 (Green)    Horizontal ABduction  AROM;12 reps;Theraband;10 reps  Theraband Level (Shoulder Horizontal ABduction)  Level 3 (Green)    External Rotation  AROM;12 reps;Theraband;10 reps    Theraband Level (Shoulder External Rotation)  Level 3 (Green)    Internal Rotation  AROM;12 reps    Flexion  AROM;12 reps;Theraband;10 reps    Theraband Level (Shoulder Flexion)  Level 3 (Green)    ABduction  AROM;12 reps;Theraband;10 reps    Theraband Level (Shoulder ABduction)  Level 3 (Green)      Shoulder Exercises: ROM/Strengthening   UBE (Upper Arm Bike)  Level 3 2' forward 2' reverse   pace: 12.0   X to V Arms  12X     Proximal Shoulder Strengthening, Supine  10X each, no rest breaks    Ball on Wall  1' flexion 1' abduction    Other ROM/Strengthening Exercises  bodycraft row/press: 20# plate, 10X      Manual Therapy   Manual Therapy  Myofascial release    Manual therapy comments  manual therapy completed sepeately from all other interventions    Myofascial Release  myofascial release and manual stretching to left upper arm, scapular, and shoulder region to decrease pain and tenderness and improve pain  free mobility in left shoulder.                 OT Short Term Goals - 12/13/18 1147      OT SHORT TERM GOAL #1   Title  Patient will be educated on HEP for improved left shoulder range of motion required for ADL completion.    Time  4    Period  Weeks    Status  On-going    Target Date  12/15/18      OT SHORT TERM GOAL #2   Title  Patient will improve left shoulder P/ROM to WNL for improved ability to brush his hair with his left hand.    Time  4    Period  Weeks    Status  Achieved      OT SHORT TERM GOAL #3   Title  Patient will decrease pain in his left shoulder region to 4/10 or better during ADL completion.    Time  4    Period  Weeks    Status  Achieved        OT Long Term Goals - 12/13/18 1147      OT LONG TERM GOAL #1   Title  Patient will return to prior level of independence with all B/IADLs, leisure activities using left upper extremity at PLOF.    Time  8    Period  Weeks    Status  On-going      OT LONG TERM GOAL #2   Title  Patient will improve left shoulder A/ROM to WNL in order to be able to reach into overhead cabinets, reach behind his back.    Time  8    Period  Weeks    Status  On-going      OT LONG TERM GOAL #3   Title  Patient will increase left shoulder strength to 5/5 in order to return to weightlifting at previous capacity.    Time  8    Period  Weeks    Status  On-going      OT LONG TERM GOAL #4   Title  Patient will decrease pain in left shoulder region to 2/10 or less when completing functional activities and weight training.    Time  8    Period  Weeks  Status  Achieved            Plan - 12/15/18 1135    Clinical Impression Statement  A: Pt reports his HEP is going well, no pain experienced. Continued with myofascial release and manual techniques to address restrictions in anterior deltoid this session. Pt completing A/ROM in supine and standing, added proximal shoulder strengthening in supine, x to v arms, Bodycraft  row and press, and green theraband strengthening this session. Verbal cuing for form and technique.    Body Structure / Function / Physical Skills  ADL;Strength;Pain;UE functional use;ROM;IADL;Fascial restriction;Muscle spasms    Plan  P: Progress to strengthening using light handweights, 1 or 2#, continue with green theraband strengthening, update HEP       Patient will benefit from skilled therapeutic intervention in order to improve the following deficits and impairments:   Body Structure / Function / Physical Skills: ADL, Strength, Pain, UE functional use, ROM, IADL, Fascial restriction, Muscle spasms       Visit Diagnosis: Stiffness of left shoulder, not elsewhere classified  Other symptoms and signs involving the musculoskeletal system  Acute pain of left shoulder    Problem List Patient Active Problem List   Diagnosis Date Noted  . Overweight (BMI 25.0-29.9) 10/09/2018  . Skin lump of leg, left 11/17/2017  . Depression, major, single episode, moderate ([place]) 07/26/2017  . Seasonal and perennial allergic rhinitis 06/30/2017  . Seizure ([place]) 03/24/2017  . Headache disorder 02/23/2017  . Syncope 02/06/2017  . Vertigo 01/08/2017  . Heart palpitations 06/18/2016  . Persistent adjustment disorder 12/05/2015  . Groin pain, chronic, right 06/28/2014  . Back pain with left-sided radiculopathy 01/16/2014  . Essential hypertension 06/19/2008  . ERECTILE DYSFUNCTION 11/07/2007  . Sinusitis 08/13/2007  . GERD 08/13/2007   Guadelupe Sabin, OTR/L  [phone] 12/15/2018, 11:40 AM  [facility] [address] [place], [place], [address] Phone: [phone]   Fax:  [phone]  Name: Evan Jacobson MRN: [id number] Date of Birth: 09-04-1965

## 2018-12-20 ENCOUNTER — Telehealth (HOSPITAL_COMMUNITY): Payer: Self-pay | Admitting: Occupational Therapy

## 2018-12-20 ENCOUNTER — Encounter (HOSPITAL_COMMUNITY): Payer: 59

## 2018-12-20 ENCOUNTER — Telehealth (HOSPITAL_COMMUNITY): Payer: Self-pay

## 2018-12-20 NOTE — Telephone Encounter (Signed)
pt called to cancel all of his nov appts due to his son has tested positive for covid 19 and he is aware of the quarantine procedure

## 2018-12-22 ENCOUNTER — Encounter (HOSPITAL_COMMUNITY): Payer: 59 | Admitting: Occupational Therapy

## 2018-12-27 ENCOUNTER — Encounter (HOSPITAL_COMMUNITY): Payer: 59

## 2018-12-29 ENCOUNTER — Encounter (HOSPITAL_COMMUNITY): Payer: 59 | Admitting: Occupational Therapy

## 2018-12-29 ENCOUNTER — Telehealth (HOSPITAL_COMMUNITY): Payer: Self-pay | Admitting: Occupational Therapy

## 2018-12-29 NOTE — Telephone Encounter (Signed)
Called pt regarding no-show for 10:30 appt today. Left message for pt with next appt time, Tues 12/8 at 9:45. Asked pt to call if unable to attend.    Guadelupe Sabin, OTR/L  [phone] 12/29/2018

## 2019-01-04 ENCOUNTER — Other Ambulatory Visit: Payer: Self-pay

## 2019-01-04 ENCOUNTER — Ambulatory Visit (HOSPITAL_COMMUNITY): Payer: 59 | Attending: Family Medicine | Admitting: Occupational Therapy

## 2019-01-04 ENCOUNTER — Encounter (HOSPITAL_COMMUNITY): Payer: Self-pay | Admitting: Occupational Therapy

## 2019-01-04 DIAGNOSIS — M25612 Stiffness of left shoulder, not elsewhere classified: Secondary | ICD-10-CM | POA: Insufficient documentation

## 2019-01-04 DIAGNOSIS — R29898 Other symptoms and signs involving the musculoskeletal system: Secondary | ICD-10-CM | POA: Diagnosis present

## 2019-01-04 DIAGNOSIS — M25512 Pain in left shoulder: Secondary | ICD-10-CM | POA: Insufficient documentation

## 2019-01-04 NOTE — Therapy (Signed)
[place], [place], [address] Phone: [phone]   Fax:  [phone]  Occupational Therapy Treatment  Patient Details  Name: Evan Jacobson MRN: [id number] Date of Birth: [birth year] Referring Provider (OT): Cherly Beach, NP (foward all progress notes to Dr. Aline Brochure)   Encounter Date: 01/04/2019  OT End of Session - 01/04/19 1040    Visit Number  4    Number of Visits  16    Date for OT Re-Evaluation  01/12/19    Authorization Type  [employer] - 60 visits combined with PT/OT/ST    Authorization - Visit Number  4    Authorization - Number of Visits  60    OT Start Time  [phone]    OT Stop Time  1030    OT Time Calculation (min)  38 min    Activity Tolerance  Patient tolerated treatment well    Behavior During Therapy  [facility] for tasks assessed/performed       Past Medical History:  Diagnosis Date  . Allergic rhinitis   . Bipolar disorder ([place]) 08/25/2011  . Chronic back pain   . Concussion 2012  . GERD (gastroesophageal reflux disease)   . Hypertension   . PTSD (post-traumatic stress disorder)     Past Surgical History:  Procedure Laterality Date  . COLONOSCOPY N/A 08/12/2017   Procedure: COLONOSCOPY;  Surgeon: Rogene Houston, MD;  [facility];  Service: Endoscopy;  Laterality: N/A;  730  . NO PAST SURGERIES      There were no vitals filed for this visit.  Subjective Assessment - 01/04/19 0952    Subjective   S: I haven't been exercising.    Currently in Pain?  Yes    Pain Score  2     Pain Location  Shoulder    Pain Orientation  Left    Pain Descriptors / Indicators  Aching;Sore    Pain Type  Chronic pain    Pain Radiating Towards  N/A    Pain Onset  More than a month ago    Pain Frequency  Intermittent    Aggravating Factors   sleeping on it, certain movements    Pain Relieving Factors  rest, injection from Dr. Aline Brochure    Effect of Pain on Daily Activities  min effect on ADLs     Multiple Pain Sites  No         [facility] OT Assessment - 01/04/19 0951      Assessment   Medical Diagnosis  Left Shoulder Pain    Referring Provider (OT)  Cherly Beach, NP   foward all progress notes to Dr. Aline Brochure     Precautions   Precautions  None               OT Treatments/Exercises (OP) - 01/04/19 0954      Exercises   Exercises  Shoulder      Shoulder Exercises: Supine   Protraction  PROM;5 reps;Strengthening;12 reps    Protraction Weight (lbs)  3    Horizontal ABduction  PROM;5 reps;Strengthening;12 reps    Horizontal ABduction Weight (lbs)  3    External Rotation  PROM;5 reps;Strengthening;12 reps    External Rotation Weight (lbs)  3    Internal Rotation  PROM;5 reps;Strengthening;12 reps    Internal Rotation Weight (lbs)  3    Flexion  PROM;5 reps;Strengthening;12 reps    Shoulder Flexion Weight (lbs)  3    ABduction  PROM;5  reps;AROM;15 reps    Shoulder ABduction Weight (lbs)  --      Shoulder Exercises: Standing   Horizontal ABduction  Strengthening;12 reps    Horizontal ABduction Weight (lbs)  3    ABduction  Strengthening;12 reps    Shoulder ABduction Weight (lbs)  3      Shoulder Exercises: ROM/Strengthening   X to V Arms  12X, 3#    Proximal Shoulder Strengthening, Supine  10X each 3#, no rest breaks    Proximal Shoulder Strengthening, Seated  10X each 3#, no rest breaks    Ball on Wall  1' flexion 1' abduction    Other ROM/Strengthening Exercises  red loop band: wall slide and lift off, lateral wall slides, diagonals (11, 9, 7), 10X each    Other ROM/Strengthening Exercises  arms on fire, 2' total, 15" each position, 4 positions      Manual Therapy   Manual Therapy  Myofascial release    Manual therapy comments  manual therapy completed sepeately from all other interventions    Myofascial Release  myofascial release and manual stretching to left upper arm, scapular, and shoulder region to decrease pain and tenderness and improve pain free  mobility in left shoulder.                 OT Short Term Goals - 12/13/18 1147      OT SHORT TERM GOAL #1   Title  Patient will be educated on HEP for improved left shoulder range of motion required for ADL completion.    Time  4    Period  Weeks    Status  On-going    Target Date  12/15/18      OT SHORT TERM GOAL #2   Title  Patient will improve left shoulder P/ROM to WNL for improved ability to brush his hair with his left hand.    Time  4    Period  Weeks    Status  Achieved      OT SHORT TERM GOAL #3   Title  Patient will decrease pain in his left shoulder region to 4/10 or better during ADL completion.    Time  4    Period  Weeks    Status  Achieved        OT Long Term Goals - 12/13/18 1147      OT LONG TERM GOAL #1   Title  Patient will return to prior level of independence with all B/IADLs, leisure activities using left upper extremity at PLOF.    Time  8    Period  Weeks    Status  On-going      OT LONG TERM GOAL #2   Title  Patient will improve left shoulder A/ROM to WNL in order to be able to reach into overhead cabinets, reach behind his back.    Time  8    Period  Weeks    Status  On-going      OT LONG TERM GOAL #3   Title  Patient will increase left shoulder strength to 5/5 in order to return to weightlifting at previous capacity.    Time  8    Period  Weeks    Status  On-going      OT LONG TERM GOAL #4   Title  Patient will decrease pain in left shoulder region to 2/10 or less when completing functional activities and weight training.    Time  8    Period  Weeks    Status  Achieved            Plan - 01/04/19 1040    Clinical Impression Statement  A: Pt reports he has not been completing HEP, minimal soreness with ADL completion. Myofascial release completed to left upper arm and trapezius to address fascial restrictions, moderate sized muscle knot palpated in trapezius region. Progressed to strengthening using 3# weight, pt unable to  complete abduction with weight in supine due to pain. Added red loop band exercises and arms on fire for stability and strengthening. Mod fatigue at end of session. Verbal cuing for form and technique.    Body Structure / Function / Physical Skills  ADL;Strength;Pain;UE functional use;ROM;IADL;Fascial restriction;Muscle spasms    Plan  P: Pt needs measurements for MD appt on Friday-go ahead and reassess, recert if needed, FOTO. Continue with red loop band and add to HEP       Patient will benefit from skilled therapeutic intervention in order to improve the following deficits and impairments:   Body Structure / Function / Physical Skills: ADL, Strength, Pain, UE functional use, ROM, IADL, Fascial restriction, Muscle spasms       Visit Diagnosis: Stiffness of left shoulder, not elsewhere classified  Other symptoms and signs involving the musculoskeletal system  Acute pain of left shoulder    Problem List Patient Active Problem List   Diagnosis Date Noted  . Overweight (BMI 25.0-29.9) 10/09/2018  . Skin lump of leg, left 11/17/2017  . Depression, major, single episode, moderate ([place]) 07/26/2017  . Seasonal and perennial allergic rhinitis 06/30/2017  . Seizure ([place]) 03/24/2017  . Headache disorder 02/23/2017  . Syncope 02/06/2017  . Vertigo 01/08/2017  . Heart palpitations 06/18/2016  . Persistent adjustment disorder 12/05/2015  . Groin pain, chronic, right 06/28/2014  . Back pain with left-sided radiculopathy 01/16/2014  . Essential hypertension 06/19/2008  . ERECTILE DYSFUNCTION 11/07/2007  . Sinusitis 08/13/2007  . GERD 08/13/2007   Guadelupe Sabin, OTR/L  [phone] 01/04/2019, 10:43 AM  [place], [place], [address] Phone: [phone]   Fax:  [phone]  Name: Evan Jacobson MRN: [id number] Date of Birth: 1965/07/24

## 2019-01-06 ENCOUNTER — Ambulatory Visit (HOSPITAL_COMMUNITY): Payer: 59

## 2019-01-07 ENCOUNTER — Ambulatory Visit: Payer: 59 | Admitting: Orthopedic Surgery

## 2019-01-07 ENCOUNTER — Encounter: Payer: Self-pay | Admitting: Orthopedic Surgery

## 2019-01-10 ENCOUNTER — Telehealth (HOSPITAL_COMMUNITY): Payer: Self-pay

## 2019-01-10 ENCOUNTER — Ambulatory Visit (HOSPITAL_COMMUNITY): Payer: 59

## 2019-01-10 NOTE — Telephone Encounter (Signed)
Called patient regarding no show. Left message reminding patient of next appointment and to call if he is unable to make it. Informed patient of attendance policy and due to his frequent no shows and cancellations he will only be able to schedule one appointment at a time.   Ailene Ravel, OTR/L,CBIS  [phone]

## 2019-01-12 ENCOUNTER — Other Ambulatory Visit: Payer: Self-pay

## 2019-01-12 ENCOUNTER — Encounter (HOSPITAL_COMMUNITY): Payer: Self-pay | Admitting: Occupational Therapy

## 2019-01-12 ENCOUNTER — Ambulatory Visit (HOSPITAL_COMMUNITY): Payer: 59 | Admitting: Occupational Therapy

## 2019-01-12 DIAGNOSIS — M25612 Stiffness of left shoulder, not elsewhere classified: Secondary | ICD-10-CM

## 2019-01-12 DIAGNOSIS — M25512 Pain in left shoulder: Secondary | ICD-10-CM

## 2019-01-12 DIAGNOSIS — R29898 Other symptoms and signs involving the musculoskeletal system: Secondary | ICD-10-CM

## 2019-01-12 NOTE — Patient Instructions (Signed)

## 2019-01-12 NOTE — Therapy (Addendum)
[place], [place], [address] Phone: [phone]   Fax:  [phone]    09/16/19 OCCUPATIONAL THERAPY DISCHARGE SUMMARY  Visits from Start of Care: 5  Current functional level related to goals / functional outcomes: Unknown. Pt did not return for therapy after last treatment visit on 01/12/19.    Remaining deficits: Unknown   Education / Equipment: HEP Plan: Patient agrees to discharge.  Patient goals were not met. Patient is being discharged due to not returning since the last visit.  ?????       Occupational Therapy Reassessment, Treatment (recertification)  Patient Details  Name: Evan Jacobson MRN: [id number] Date of Birth: [birth year] Referring Provider (OT): Cherly Beach, NP (forward all progress notes to Dr. Aline Brochure)   Encounter Date: 01/12/2019  OT End of Session - 01/12/19 1048    Visit Number  5    Number of Visits  16    Date for OT Re-Evaluation  02/11/19    Authorization Type  [employer] - 41 visits combined with PT/OT/ST    Authorization - Visit Number  5    Authorization - Number of Visits  60    OT Start Time  [phone]    OT Stop Time  1029    OT Time Calculation (min)  42 min    Activity Tolerance  Patient tolerated treatment well    Behavior During Therapy  WFL for tasks assessed/performed       Past Medical History:  Diagnosis Date  . Allergic rhinitis   . Bipolar disorder ([place]) 08/25/2011  . Chronic back pain   . Concussion 2012  . GERD (gastroesophageal reflux disease)   . Hypertension   . PTSD (post-traumatic stress disorder)     Past Surgical History:  Procedure Laterality Date  . COLONOSCOPY N/A 08/12/2017   Procedure: COLONOSCOPY;  Surgeon: Rogene Houston, MD;  [facility];  Service: Endoscopy;  Laterality: N/A;  730  . NO PAST SURGERIES      There were no vitals filed for this visit.  Subjective Assessment - 01/12/19 0948    Subjective   S: It's  really been sore.    Currently in Pain?  Yes    Pain Score  5     Pain Location  Shoulder    Pain Orientation  Left    Pain Descriptors / Indicators  Aching;Sore    Pain Type  Chronic pain    Pain Radiating Towards  N/A    Pain Onset  More than a month ago    Pain Frequency  Intermittent    Aggravating Factors   sleeping on it, certain movements    Pain Relieving Factors  rest, injection from Dr. Aline Brochure    Effect of Pain on Daily Activities  min effect on ADLs    Multiple Pain Sites  No         [facility] OT Assessment - 01/12/19 0947      Assessment   Medical Diagnosis  Left Shoulder Pain    Referring Provider (OT)  Cherly Beach, NP   forward all progress notes to Dr. Aline Brochure     Precautions   Precautions  None      Observation/Other Assessments   Focus on Therapeutic Outcomes (FOTO)   70/100   55/100 previous     AROM   Overall AROM Comments  assessed in seated, external and internal rotation with shoulder adducted    AROM Assessment Site  Shoulder    Right/Left Shoulder  Left    Left Shoulder Flexion  172 Degrees   147 previous   Left Shoulder ABduction  180 Degrees   150 previous   Left Shoulder Internal Rotation  90 Degrees   same as previous   Left Shoulder External Rotation  61 Degrees   same as previous     PROM   Overall PROM   Within functional limits for tasks performed      Strength   Overall Strength Comments  assessed in seated, external and internal rotation with shoulder adducted    Strength Assessment Site  Shoulder    Right/Left Shoulder  Left    Left Shoulder Flexion  5/5   same as previous   Left Shoulder ABduction  5/5   same as previous   Left Shoulder Internal Rotation  5/5   same as previous   Left Shoulder External Rotation  4+/5   same as previous              OT Treatments/Exercises (OP) - 01/12/19 0950      Exercises   Exercises  Shoulder      Shoulder Exercises: Supine   Protraction  PROM;5 reps    Horizontal  ABduction  PROM;5 reps    External Rotation  PROM;5 reps    Internal Rotation  PROM;5 reps    Flexion  PROM;5 reps    ABduction  PROM;5 reps      Shoulder Exercises: Standing   Protraction  Theraband;10 reps    Theraband Level (Shoulder Protraction)  Level 3 (Green)    Horizontal ABduction  Theraband;10 reps    Theraband Level (Shoulder Horizontal ABduction)  Level 3 (Green)    External Rotation  Theraband;10 reps    Theraband Level (Shoulder External Rotation)  Level 3 (Green)    Internal Rotation  Theraband;10 reps    Theraband Level (Shoulder Internal Rotation)  Level 3 (Green)    Flexion  Theraband;10 reps    Theraband Level (Shoulder Flexion)  Level 3 (Green)    ABduction  Theraband;10 reps    Theraband Level (Shoulder ABduction)  Level 3 (Green)      Shoulder Exercises: Therapy Ball   Other Therapy Ball Exercises  green therapy ball with 2# wrist weights: chest press, overhead press, flexion, diagonals, circles each direction, 10X each      Shoulder Exercises: ROM/Strengthening   UBE (Upper Arm Bike)  Level 3 2' forward 2' reverse   pace: 12.0   Other ROM/Strengthening Exercises  ABC writing using 2# weight    Other ROM/Strengthening Exercises  overhead carry: 2#, 2'      Manual Therapy   Manual Therapy  Myofascial release    Manual therapy comments  manual therapy completed sepeately from all other interventions    Myofascial Release  myofascial release and manual stretching to left upper arm, scapular, and shoulder region to decrease pain and tenderness and improve pain free mobility in left shoulder.               OT Education - 01/12/19 1022    Education Details  green theraband strengthening    Person(s) Educated  Patient    Methods  Explanation;Demonstration;Handout;Verbal cues    Comprehension  Verbalized understanding;Returned demonstration       OT Short Term Goals - 01/12/19 1048      OT SHORT TERM GOAL #1   Title  Patient will be educated on HEP  for improved left shoulder  range of motion required for ADL completion.    Time  4    Period  Weeks    Status  On-going    Target Date  12/15/18      OT SHORT TERM GOAL #2   Title  Patient will improve left shoulder P/ROM to WNL for improved ability to brush his hair with his left hand.    Time  4    Period  Weeks    Status  Achieved      OT SHORT TERM GOAL #3   Title  Patient will decrease pain in his left shoulder region to 4/10 or better during ADL completion.    Time  4    Period  Weeks    Status  Achieved        OT Long Term Goals - 01/12/19 1048      OT LONG TERM GOAL #1   Title  Patient will return to prior level of independence with all B/IADLs, leisure activities using left upper extremity at PLOF.    Time  8    Period  Weeks    Status  On-going      OT LONG TERM GOAL #2   Title  Patient will improve left shoulder A/ROM to WNL in order to be able to reach into overhead cabinets, reach behind his back.    Time  8    Period  Weeks    Status  Achieved      OT LONG TERM GOAL #3   Title  Patient will increase left shoulder strength to 5/5 in order to return to weightlifting at previous capacity.    Time  8    Period  Weeks    Status  Partially Met      OT LONG TERM GOAL #4   Title  Patient will decrease pain in left shoulder region to 3/10 or less when completing functional activities and weight training.    Baseline  12/16: pt reporting pain and soreness at 5/10 or greater daily    Time  8    Period  Weeks    Status  Revised            Plan - 01/12/19 1049    Clinical Impression Statement  A: Reassessment completed this session. Pt reports he has had constant soreness and sometimes pain up to a 10/10 over the past few days. Pt has improved ROM and strength and has met 2/3 STGs and 1 LTG, also with 1 LTG partially met. Revised pain goal to reflect current status. Pt has only attended 5 sessions due to various reasons-sickness, pain, etc. Discussed progress  and remaining deficits with pt who would like to continue with therapy for 4 additional weeks and will make follow up appt with MD. This session focusing on strengthening, updated HEP for green theraband strengthening exercises. Also added overhead carry and ABC writing tasks.  Verbal cuing for form and technique.    Body Structure / Function / Physical Skills  ADL;Strength;Pain;UE functional use;ROM;IADL;Fascial restriction;Muscle spasms    Rehab Potential  Good    Clinical Decision Making  Limited treatment options, no task modification necessary    Comorbidities Affecting Occupational Performance:  None    Modification or Assistance to Complete Evaluation   No modification of tasks or assist necessary to complete eval    OT Frequency  2x / week    OT Duration  4 weeks    OT Treatment/Interventions  Self-care/ADL training;Moist Heat;DME and/or  AE instruction;Therapeutic activities;Ultrasound;Therapeutic exercise;Passive range of motion;Neuromuscular education;Cryotherapy;Energy conservation;Manual Therapy;Patient/family education    Plan  P: manual techniques only as needed, continue with shoulder and scapular strengthening, follow up on HEP. Make one appt at a time.       Patient will benefit from skilled therapeutic intervention in order to improve the following deficits and impairments:   Body Structure / Function / Physical Skills: ADL, Strength, Pain, UE functional use, ROM, IADL, Fascial restriction, Muscle spasms       Visit Diagnosis: Stiffness of left shoulder, not elsewhere classified  Other symptoms and signs involving the musculoskeletal system  Acute pain of left shoulder    Problem List Patient Active Problem List   Diagnosis Date Noted  . Overweight (BMI 25.0-29.9) 10/09/2018  . Skin lump of leg, left 11/17/2017  . Depression, major, single episode, moderate ([place]) 07/26/2017  . Seasonal and perennial allergic rhinitis 06/30/2017  . Seizure ([place]) 03/24/2017  .  Headache disorder 02/23/2017  . Syncope 02/06/2017  . Vertigo 01/08/2017  . Heart palpitations 06/18/2016  . Persistent adjustment disorder 12/05/2015  . Groin pain, chronic, right 06/28/2014  . Back pain with left-sided radiculopathy 01/16/2014  . Essential hypertension 06/19/2008  . ERECTILE DYSFUNCTION 11/07/2007  . Sinusitis 08/13/2007  . GERD 08/13/2007   Guadelupe Sabin, OTR/L  [phone] 01/12/2019, 10:56 AM  [facility] [address] [place], [place], [address] Phone: [phone]   Fax:  [phone]  Name: Evan Jacobson MRN: [id number] Date of Birth: 25-Feb-1965

## 2019-01-18 ENCOUNTER — Telehealth (HOSPITAL_COMMUNITY): Payer: Self-pay

## 2019-01-18 ENCOUNTER — Ambulatory Visit (HOSPITAL_COMMUNITY): Payer: 59

## 2019-01-18 NOTE — Telephone Encounter (Signed)
pt called to cancel today's appt due to he has to go to [place] on business.

## 2019-02-22 ENCOUNTER — Encounter: Payer: 59 | Admitting: Family Medicine

## 2019-02-23 ENCOUNTER — Ambulatory Visit
Admission: EM | Admit: 2019-02-23 | Discharge: 2019-02-23 | Disposition: A | Discharge status: Home / Self Care | Payer: No Typology Code available for payment source | Attending: Emergency Medicine | Admitting: Emergency Medicine

## 2019-02-23 ENCOUNTER — Other Ambulatory Visit: Payer: Self-pay

## 2019-02-23 DIAGNOSIS — Z20822 Contact with and (suspected) exposure to covid-19: Secondary | ICD-10-CM

## 2019-02-23 NOTE — ED Triage Notes (Signed)
Pt presents to [facility] w/ c/o lowgrade fever starting yesterday. Pt states his son is covid positive.

## 2019-02-23 NOTE — Discharge Instructions (Addendum)
COVID testing ordered.  It will take between 5-7 days for test results.  Someone will contact you regarding abnormal results.    In the meantime: You should remain isolated in your home for 10 days from symptom onset AND greater than 72 hours after symptoms resolution (absence of fever without the use of fever-reducing medication and improvement in respiratory symptoms), whichever is longer Get plenty of rest and push fluids Use OTC zyrtec for nasal congestion, runny nose, and/or sore throat Use OTC flonase for nasal congestion and runny nose Use OTC medications like ibuprofen or tylenol as needed fever or pain Call or go to the ED if you have any new or worsening symptoms such as fever, cough, shortness of breath, chest tightness, chest pain, turning blue, changes in mental status, etc..Marland Kitchen

## 2019-02-23 NOTE — ED Provider Notes (Signed)
[place]   [id number] 02/23/19 Arrival Time: [id number]   CC: COVID symptoms  SUBJECTIVE: History from: patient.  Evan Jacobson is a 54 y.o. male who presents with low grade fever of "98.3," and body aches 2 - 3 days.  Son tested positive for COVID this past weekend.  Denies alleviating or aggravating factors.  Denies previous symptoms in the past.   Denies fever, chills, sinus pain, rhinorrhea, sore throat, cough, SOB, wheezing, chest pain, nausea, vomiting, changes in bowel or bladder habits.     ROS: As per HPI.  All other pertinent ROS negative.     Past Medical History:  Diagnosis Date  . Allergic rhinitis   . Bipolar disorder ([place]) 08/25/2011  . Chronic back pain   . Concussion 2012  . GERD (gastroesophageal reflux disease)   . Hypertension   . PTSD (post-traumatic stress disorder)    Past Surgical History:  Procedure Laterality Date  . COLONOSCOPY N/A 08/12/2017   Procedure: COLONOSCOPY;  Surgeon: Rogene Houston, MD;  [facility];  Service: Endoscopy;  Laterality: N/A;  730  . NO PAST SURGERIES     Allergies  Allergen Reactions  . Sulfa Antibiotics Anaphylaxis  . Ace Inhibitors Other (See Comments)    Feels as though throat is closing up intermittently  . Sulfasalazine Other (See Comments)    Tongue swelled   . Sulfonamide Derivatives Other (See Comments)    Tongue swelled   No current facility-administered medications on file prior to encounter.   Current Outpatient Medications on File Prior to Encounter  Medication Sig Dispense Refill  . amLODipine (NORVASC) 2.5 MG tablet Take 3 tablets (7.5 mg total) by mouth daily. 270 tablet 1  . cetirizine (ZYRTEC) 10 MG tablet Take 10 mg by mouth daily.    . cyclobenzaprine (FLEXERIL) 10 MG tablet Take 1 tablet (10 mg total) by mouth 2 (two) times daily as needed for muscle spasms. 20 tablet 0  . EPINEPHrine 0.3 mg/0.3 mL IJ SOAJ injection Inject 0.3 mg into the muscle once.     . fluticasone  (FLONASE) 50 MCG/ACT nasal spray Place 2 sprays into both nostrils daily. 16 g 6  . HYDROcodone-acetaminophen (NORCO/VICODIN) 5-325 MG tablet Take 1-2 tablets by mouth every 6 (six) hours as needed. (Patient not taking: Reported on 11/29/2018) 12 tablet 0  . ibuprofen (ADVIL) 600 MG tablet Take 1 tablet (600 mg total) by mouth every 6 (six) hours as needed. 30 tablet 0  . Krill Oil 500 MG CAPS Take 500 mg by mouth daily.     . Multiple Vitamin (MULTIVITAMIN WITH MINERALS) TABS tablet Take 2 tablets by mouth daily.      Social History   Socioeconomic History  . Marital status: Married    Spouse name: Not on file  . Number of children: 2  . Years of education: Not on file  . Highest education level: Not on file  Occupational History    Comment: Audio video tech    Employer: [employer]  Tobacco Use  . Smoking status: Never Smoker  . Smokeless tobacco: Never Used  Substance and Sexual Activity  . Alcohol use: Yes    Comment: occ; six pack per week  . Drug use: No  . Sexual activity: Yes    Birth control/protection: None  Other Topics Concern  . Not on file  Social History Narrative  . Not on file   Social Determinants of Health   Financial Resource Strain:   . Difficulty  of Paying Living Expenses: Not on file  Food Insecurity:   . Worried About Charity fundraiser in the Last Year: Not on file  . Ran Out of Food in the Last Year: Not on file  Transportation Needs:   . Lack of Transportation (Medical): Not on file  . Lack of Transportation (Non-Medical): Not on file  Physical Activity:   . Days of Exercise per Week: Not on file  . Minutes of Exercise per Session: Not on file  Stress:   . Feeling of Stress : Not on file  Social Connections:   . Frequency of Communication with Friends and Family: Not on file  . Frequency of Social Gatherings with Friends and Family: Not on file  . Attends Religious Services: Not on file  . Active Member of Clubs or Organizations: Not on file   . Attends Archivist Meetings: Not on file  . Marital Status: Not on file  Intimate Partner Violence:   . Fear of Current or Ex-Partner: Not on file  . Emotionally Abused: Not on file  . Physically Abused: Not on file  . Sexually Abused: Not on file   Family History  Problem Relation Age of Onset  . Stroke Father   . Hypertension Father   . Hypertension Mother        [name]  . Heart disease Mother        Angina  . Hypertension Sister   . Hypertension Sister   . Hypertension Brother   . Hypertension Brother   . Colon cancer Neg Hx     OBJECTIVE:  Vitals:   02/23/19 1148  BP: (!) 138/95  Pulse: 91  Resp: 16  Temp: 98.5 F (36.9 C)  TempSrc: Oral  SpO2: 97%     General appearance: alert; appears mildly fatigued, but nontoxic; speaking in full sentences and tolerating own secretions HEENT: NCAT; Ears: EACs clear, TMs pearly gray; Eyes: PERRL.  EOM grossly intact. Nose: nares patent without rhinorrhea, Throat: oropharynx clear, tonsils non erythematous or enlarged, uvula midline  Neck: supple without LAD Lungs: unlabored respirations, symmetrical air entry; cough: absent; no respiratory distress; CTAB Heart: regular rate and rhythm.  Skin: warm and dry Psychological: alert and cooperative; normal mood and affect  ASSESSMENT & PLAN:  1. Suspected COVID-19 virus infection   2. Exposure to COVID-19 virus    COVID testing ordered.  It will take between 5-7 days for test results.  Someone will contact you regarding abnormal results.    In the meantime: You should remain isolated in your home for 10 days from symptom onset AND greater than 72 hours after symptoms resolution (absence of fever without the use of fever-reducing medication and improvement in respiratory symptoms), whichever is longer Get plenty of rest and push fluids Use OTC zyrtec for nasal congestion, runny nose, and/or sore throat Use OTC flonase for nasal congestion and runny nose Use OTC  medications like ibuprofen or tylenol as needed fever or pain Call or go to the ED if you have any new or worsening symptoms such as fever, cough, shortness of breath, chest tightness, chest pain, turning blue, changes in mental status, etc...    Reviewed expectations re: course of current medical issues. Questions answered. Outlined signs and symptoms indicating need for more acute intervention. Patient verbalized understanding. After Visit Summary given.         Lestine Box, PA-C 02/23/19 1230

## 2019-02-24 LAB — NOVEL CORONAVIRUS, NAA: SARS-CoV-2, NAA: NOT DETECTED

## 2019-03-02 ENCOUNTER — Ambulatory Visit
Admission: EM | Admit: 2019-03-02 | Discharge: 2019-03-02 | Disposition: A | Discharge status: Home / Self Care | Payer: 59 | Attending: Emergency Medicine | Admitting: Emergency Medicine

## 2019-03-02 DIAGNOSIS — Z20822 Contact with and (suspected) exposure to covid-19: Secondary | ICD-10-CM | POA: Diagnosis not present

## 2019-03-02 NOTE — Discharge Instructions (Signed)

## 2019-03-02 NOTE — ED Provider Notes (Signed)
[facility]    CSN: [id number] Arrival date & time: 03/02/19  1129      History   Chief Complaint Chief Complaint  Patient presents with  . Fever    HPI Evan Jacobson is a 54 y.o. male.   Who presented to the urgent care with a complaint of fever for the past 3 to 5 days.  Reported positive Covid exposure.  Denies sick exposure to  flu or strep.  Denies recent travel.  Denies aggravating or alleviating symptoms.  Denies previous COVID infection.   Denies chills, fatigue, nasal congestion, rhinorrhea, sore throat, cough, SOB, wheezing, chest pain, nausea, vomiting, changes in bowel or bladder habits.       Past Medical History:  Diagnosis Date  . Allergic rhinitis   . Bipolar disorder ([place]) 08/25/2011  . Chronic back pain   . Concussion 2012  . GERD (gastroesophageal reflux disease)   . Hypertension   . PTSD (post-traumatic stress disorder)     Patient Active Problem List   Diagnosis Date Noted  . Overweight (BMI 25.0-29.9) 10/09/2018  . Skin lump of leg, left 11/17/2017  . Depression, major, single episode, moderate ([place]) 07/26/2017  . Seasonal and perennial allergic rhinitis 06/30/2017  . Seizure ([place]) 03/24/2017  . Headache disorder 02/23/2017  . Syncope 02/06/2017  . Vertigo 01/08/2017  . Heart palpitations 06/18/2016  . Persistent adjustment disorder 12/05/2015  . Groin pain, chronic, right 06/28/2014  . Back pain with left-sided radiculopathy 01/16/2014  . Essential hypertension 06/19/2008  . ERECTILE DYSFUNCTION 11/07/2007  . Sinusitis 08/13/2007  . GERD 08/13/2007    Past Surgical History:  Procedure Laterality Date  . COLONOSCOPY N/A 08/12/2017   Procedure: COLONOSCOPY;  Surgeon: Rogene Houston, MD;  [facility];  Service: Endoscopy;  Laterality: N/A;  730  . NO PAST SURGERIES         Home Medications    Prior to Admission medications   Medication Sig Start Date End Date Taking? Authorizing Provider  amLODipine  (NORVASC) 2.5 MG tablet Take 3 tablets (7.5 mg total) by mouth daily. 10/06/18   Fayrene Helper, MD  cetirizine (ZYRTEC) 10 MG tablet Take 10 mg by mouth daily.    [provider]  cyclobenzaprine (FLEXERIL) 10 MG tablet Take 1 tablet (10 mg total) by mouth 2 (two) times daily as needed for muscle spasms. 10/22/18   Noe Gens, PA-C  EPINEPHrine 0.3 mg/0.3 mL IJ SOAJ injection Inject 0.3 mg into the muscle once.  07/20/17   [provider]  fluticasone (FLONASE) 50 MCG/ACT nasal spray Place 2 sprays into both nostrils daily. 04/23/17   Fayrene Helper, MD  ibuprofen (ADVIL) 600 MG tablet Take 1 tablet (600 mg total) by mouth every 6 (six) hours as needed. 10/22/18   Noe Gens, PA-C  Krill Oil 500 MG CAPS Take 500 mg by mouth daily.     [provider]  Multiple Vitamin (MULTIVITAMIN WITH MINERALS) TABS tablet Take 2 tablets by mouth daily.     [provider]    Family History Family History  Problem Relation Age of Onset  . Stroke Father   . Hypertension Father   . Hypertension Mother        [name]  . Heart disease Mother        Angina  . Hypertension Sister   . Hypertension Sister   . Hypertension Brother   . Hypertension Brother   . Colon cancer Neg Hx  Social History Social History   Tobacco Use  . Smoking status: Never Smoker  . Smokeless tobacco: Never Used  Substance Use Topics  . Alcohol use: Yes    Comment: occ; six pack per week  . Drug use: No     Allergies   Sulfa antibiotics, Ace inhibitors, Sulfasalazine, and Sulfonamide derivatives   Review of Systems Review of Systems  Constitutional: Negative.   HENT: Negative.   Respiratory: Negative.   Cardiovascular: Negative.   Gastrointestinal: Negative.   Neurological: Negative.      Physical Exam Triage Vital Signs ED Triage Vitals  Enc Vitals Group     BP 03/02/19 1144 126/81     Pulse Rate 03/02/19 1144 68     Resp 03/02/19 1144 16     Temp 03/02/19  1144 98.5 F (36.9 C)     Temp Source 03/02/19 1144 Oral     SpO2 03/02/19 1144 98 %     Weight --      Height --      Head Circumference --      Peak Flow --      Pain Score 03/02/19 1158 0     Pain Loc --      Pain Edu? --      Excl. in [place]? --    No data found.  Updated Vital Signs BP 126/81 (BP Location: Right Arm)   Pulse 68   Temp 98.5 F (36.9 C) (Oral)   Resp 16   SpO2 98%   Visual Acuity Right Eye Distance:   Left Eye Distance:   Bilateral Distance:    Right Eye Near:   Left Eye Near:    Bilateral Near:     Physical Exam Vitals and nursing note reviewed.  Constitutional:      General: He is not in acute distress.    Appearance: Normal appearance. He is normal weight. He is not ill-appearing or toxic-appearing.  HENT:     Head: Normocephalic.     Right Ear: Tympanic membrane, ear canal and external ear normal. There is no impacted cerumen.     Left Ear: Tympanic membrane, ear canal and external ear normal. There is no impacted cerumen.     Nose: Nose normal. No congestion.     Mouth/Throat:     Mouth: Mucous membranes are moist.     Pharynx: Oropharynx is clear. No oropharyngeal exudate or posterior oropharyngeal erythema.  Cardiovascular:     Rate and Rhythm: Normal rate and regular rhythm.     Pulses: Normal pulses.     Heart sounds: Normal heart sounds. No murmur.  Pulmonary:     Effort: Pulmonary effort is normal. No respiratory distress.     Breath sounds: Normal breath sounds. No wheezing or rhonchi.  Chest:     Chest wall: No tenderness.  Abdominal:     General: Abdomen is flat. Bowel sounds are normal. There is no distension.     Palpations: There is no mass.     Tenderness: There is no abdominal tenderness.  Skin:    Capillary Refill: Capillary refill takes less than 2 seconds.  Neurological:     General: No focal deficit present.     Mental Status: He is alert and oriented to person, place, and time.      UC Treatments / Results   Labs (all labs ordered are listed, but only abnormal results are displayed) Labs Reviewed  NOVEL CORONAVIRUS, NAA    EKG   Radiology  No results found.  Procedures Procedures (including critical care time)  Medications Ordered in UC Medications - No data to display  Initial Impression / Assessment and Plan / UC Course  I have reviewed the triage vital signs and the nursing notes.  Pertinent labs & imaging results that were available during my care of the patient were reviewed by me and considered in my medical decision making (see chart for details).    COVID-19 test was ordered. Advised patient to quarantine Take OTC Tylenol to manage fever To go to ED for worsening of symptoms Work note was given Patient verbalized understanding the plan of care  Final Clinical Impressions(s) / UC Diagnoses   Final diagnoses:  Suspected COVID-19 virus infection     Discharge Instructions     COVID testing ordered.  It will take between 2-7 days for test results.  Someone will contact you regarding abnormal results.    In the meantime: You should remain isolated in your home for 10 days from symptom onset AND greater than 72 hours after symptoms resolution (absence of fever without the use of fever-reducing medication and improvement in respiratory symptoms), whichever is longer Get plenty of rest and push fluids Use medications daily for symptom relief Use OTC medications like ibuprofen or tylenol as needed fever or pain Call or go to the ED if you have any new or worsening symptoms such as fever, worsening cough, shortness of breath, chest tightness, chest pain, turning blue, changes in mental status, etc...     ED Prescriptions    None     PDMP not reviewed this encounter.   Emerson Monte, FNP 03/02/19 1232

## 2019-03-02 NOTE — ED Triage Notes (Signed)
Pt developing fever after covid exposure

## 2019-03-03 ENCOUNTER — Telehealth: Payer: Self-pay | Admitting: Cardiology

## 2019-03-03 LAB — NOVEL CORONAVIRUS, NAA: SARS-CoV-2, NAA: NOT DETECTED

## 2019-03-03 NOTE — Telephone Encounter (Signed)
I reviewed the ER note from yesterday which does not mention the presence of any chest pain.  Virtual visit encounter noted.  I would suggest however, that if he has increasing episodes of chest pain in the setting of COVID-19, he should be seen in the ER as he may need further inpatient evaluation.

## 2019-03-03 NOTE — Telephone Encounter (Signed)
Please give pt a call-- states he's having pressure in his chest    [phone]

## 2019-03-03 NOTE — Progress Notes (Signed)
Virtual Visit via Video Note   This visit type was conducted due to national recommendations for restrictions regarding the COVID-19 Pandemic (e.g. social distancing) in an effort to limit this patient's exposure and mitigate transmission in our community.  Due to his co-morbid illnesses, this patient is at least at moderate risk for complications without adequate follow up.  This format is felt to be most appropriate for this patient at this time.  All issues noted in this document were discussed and addressed.  A limited physical exam was performed with this format.  Please refer to the patient's chart for his consent to telehealth for [facility].   Date:  03/04/2019   ID:  Evan Jacobson, DOB July 07, 1965, MRN [id number]  Patient Location: Home Provider Location: Home  PCP:  Fayrene Helper, MD  Cardiologist:  Rozann Lesches, MD Electrophysiologist:  None   Evaluation Performed:  Follow-Up Visit  Chief Complaint:  Chest pain  History of Present Illness:    Evan Jacobson is a 54 y.o. male last seen in the office back in September 2019.  He called to schedule a follow-up visit and we met via video conferencing today.  He states that over the last 3 weeks or so he has been experiencing recurring episodes of chest tightness, like a fist in his chest.  This is not necessarily exertional however, and generally the episodes are very brief.  He states that he has a beer in the evening, feels like the symptoms are sometimes worse after that, but not exclusively.  He remains concerned about the status of his heart, reports a family member that died in their late 40s suddenly with heart disease that was discovered at autopsy.  We undertook cardiac testing back in 2018 at which point he had a normal exercise echocardiogram.  Record review finds recent evaluation in urgent care with low-grade fevers and suspected COVID-19, although it looks like testing was negative. His son and wife are  both positive for COVID-19.  He does not report any active cough, no recurrent fevers or chills now.  I reviewed his medications which are outlined below.   Past Medical History:  Diagnosis Date  . Allergic rhinitis   . Bipolar disorder ([place]) 08/25/2011  . Chronic back pain   . Concussion 2012  . GERD (gastroesophageal reflux disease)   . Hypertension   . PTSD (post-traumatic stress disorder)    Past Surgical History:  Procedure Laterality Date  . COLONOSCOPY N/A 08/12/2017   Procedure: COLONOSCOPY;  Surgeon: Rogene Houston, MD;  [facility];  Service: Endoscopy;  Laterality: N/A;  730  . NO PAST SURGERIES       Current Meds  Medication Sig  . amLODipine (NORVASC) 2.5 MG tablet Take 3 tablets (7.5 mg total) by mouth daily.  . cetirizine (ZYRTEC) 10 MG tablet Take 10 mg by mouth daily.  . cyclobenzaprine (FLEXERIL) 10 MG tablet Take 1 tablet (10 mg total) by mouth 2 (two) times daily as needed for muscle spasms.  Marland Kitchen EPINEPHrine 0.3 mg/0.3 mL IJ SOAJ injection Inject 0.3 mg into the muscle once.   Marland Kitchen ibuprofen (ADVIL) 600 MG tablet Take 1 tablet (600 mg total) by mouth every 6 (six) hours as needed.  [name] Oil 500 MG CAPS Take 500 mg by mouth daily.   . Multiple Vitamin (MULTIVITAMIN WITH MINERALS) TABS tablet Take 2 tablets by mouth daily.      Allergies:   Sulfa antibiotics, Ace inhibitors, Sulfasalazine,  and Sulfonamide derivatives   Social History   Tobacco Use  . Smoking status: Never Smoker  . Smokeless tobacco: Never Used  Substance Use Topics  . Alcohol use: Yes    Comment: occ; six pack per week  . Drug use: No     Family Hx: The patient's family history includes Heart disease in his mother; Hypertension in his brother, brother, father, mother, sister, and sister; Stroke in his father. There is no history of Colon cancer.  ROS:   No palpitations, lightheadedness, or syncope.  Prior CV studies:   The following studies were reviewed  today:  30-day event recorder 02/16/2017: Available strips from 30-day event recorder reviewed. Sinus rhythm, sinus arrhythmia, and sinus tachycardia noted without clear correlation of specific heart rate to symptoms. No obvious sustained arrhythmias or pauses.  Carotid Dopplers 02/04/2017: IMPRESSION: 1. No significant carotid plaque or stenosis. 2. Antegrade bilateral vertebral arterial flow.  Exercise echocardiogram 07/01/2016: Study Conclusions  - Stress ECG conclusions: The stress ECG was negative for ischemia. Duke scoring: exercise time of 10 min; maximum ST deviation of 0 mm; no angina; resulting score is 10. This score predicts a low risk of cardiac events. - Staged echo: There was no diagnostic evidence for stress-induced ischemia.  Labs/Other Tests and Data Reviewed:    EKG:  An ECG dated 10/21/2017 was personally reviewed today and demonstrated:  Sinus rhythm with nonspecific ST-T changes.  Recent Labs: 07/22/2018: ALT 25; BUN 14; Creat 1.38; Hemoglobin 15.7; Platelets 152; Potassium 4.4; Sodium 138; TSH 0.74   Recent Lipid Panel Lab Results  Component Value Date/Time   CHOL 216 (H) 07/22/2018 10:15 AM   TRIG 118 07/22/2018 10:15 AM   HDL 54 07/22/2018 10:15 AM   CHOLHDL 4.0 07/22/2018 10:15 AM   LDLCALC 138 (H) 07/22/2018 10:15 AM    Wt Readings from Last 3 Encounters:  03/04/19 200 lb (90.7 kg)  11/29/18 200 lb (90.7 kg)  11/02/18 206 lb (93.4 kg)     Objective:    Vital Signs:  BP (!) 120/95   Pulse 91   Ht 5' 11"  (1.803 m)   Wt 200 lb (90.7 kg)   BMI 27.89 kg/m    General: Patient appeared comfortable at rest. HEENT: Conjunctiva and lids normal. Skin: Normal appearance of color and turgor. Lungs: Patient spoke in full sentences easily, not obviously short of breath, no audible coughing or wheezing. Neuropsychiatric: Gaze conjugate, speech pattern normal.  Patient observed to stand up and ambulate, affect appropriate.  ASSESSMENT & PLAN:     1.  Recurrent chest pain, rule out angina pectoris in a 54 year old male with a history of hypertension, LDL 138 by last check, also reported history of early cardiac death in one male relative.  His symptoms are fairly atypical in description, however given intermediate pretest probability, follow-up cardiac testing will be arranged.  He had a normal exercise echocardiogram in 2018.  We will proceed with anatomical cardiac evaluation via cardiac CTA.  2.  Essential hypertension, currently on Norvasc.  No changes were made today, keep follow-up with Dr. Moshe Cipro.  3.  Reported COVID-19 exposure, both son and wife recently recovered.  He states he had some low-grade fevers recently but has had two negative tests himself.   Time:   Today, I have spent 8 minutes with the patient with telehealth technology discussing the above problems.     Medication Adjustments/Labs and Tests Ordered: Current medicines are reviewed at length with the patient today.  Concerns regarding  medicines are outlined above.   Tests Ordered: Orders Placed This Encounter  Procedures  . CT CORONARY MORPH W/CTA COR W/SCORE W/CA W/CM &/OR WO/CM  . CT CORONARY FRACTIONAL FLOW RESERVE DATA PREP  . CT CORONARY FRACTIONAL FLOW RESERVE FLUID ANALYSIS  . Basic Metabolic Panel (BMET)    Medication Changes: Meds ordered this encounter  Medications  . metoprolol tartrate (LOPRESSOR) 100 MG tablet    Sig: Take 1 tablet (100 mg) 2 hours before CT scan    Dispense:  1 tablet    Refill:  0    Follow Up:  test results.   Signed, Rozann Lesches, MD  03/04/2019 10:30 AM    [place]

## 2019-03-03 NOTE — Telephone Encounter (Signed)
Evan Jacobson is going to call me when he gets his COVID results. I informed of what Dr.McDowell said regarding the need for ED evaluation.

## 2019-03-03 NOTE — Telephone Encounter (Signed)
I spoke with patient. His son and wife are both confirmed COVID positive this week. He has had body aches, low grade temperature also.He had COVID testing yesterday- awaits results.  He also states he has had intermittent chest pain for the past month, lasting around ten seconds at a time, describes as a" fist trying to come out" of his chest. He is using Advil for pain .Denies any palpitations. He has a Video apt made for tomorrow.

## 2019-03-04 ENCOUNTER — Telehealth (INDEPENDENT_AMBULATORY_CARE_PROVIDER_SITE_OTHER): Payer: 59 | Admitting: Cardiology

## 2019-03-04 ENCOUNTER — Encounter: Payer: Self-pay | Admitting: Cardiology

## 2019-03-04 VITALS — BP 120/95 | HR 91 | Ht 71.0 in | Wt 200.0 lb

## 2019-03-04 DIAGNOSIS — Z87898 Personal history of other specified conditions: Secondary | ICD-10-CM

## 2019-03-04 DIAGNOSIS — I209 Angina pectoris, unspecified: Secondary | ICD-10-CM

## 2019-03-04 DIAGNOSIS — I259 Chronic ischemic heart disease, unspecified: Secondary | ICD-10-CM

## 2019-03-04 DIAGNOSIS — I10 Essential (primary) hypertension: Secondary | ICD-10-CM

## 2019-03-04 DIAGNOSIS — Z01818 Encounter for other preprocedural examination: Secondary | ICD-10-CM

## 2019-03-04 MED ORDER — METOPROLOL TARTRATE 100 MG PO TABS: ORAL_TABLET | ORAL | 0 refills | Status: DC

## 2019-03-04 NOTE — Patient Instructions (Addendum)
Your cardiac CT will be scheduled at one of the below locations:   [facility] [address] [place], [place] [address] [phone]  [place] [address] [place], [place] [address] [phone]  If scheduled at [facility], please arrive at the [facility] main entrance of [facility] 30 minutes prior to test start time. Proceed to the [facility] Radiology Department (first floor) to check-in and test prep.  If scheduled at [facility], please arrive 15 mins early for check-in and test prep.  Please follow these instructions carefully (unless otherwise directed):  Hold all erectile dysfunction medications at least 3 days (72 hrs) prior to test.  On the Night Before the Test: . Be sure to Drink plenty of water. . Do not consume any caffeinated/decaffeinated beverages or chocolate 12 hours prior to your test. . Do not take any antihistamines 12 hours prior to your test.   On the Day of the Test: . Drink plenty of water. Do not drink any water within one hour of the test. . Do not eat any food 4 hours prior to the test. . You may take your regular medications prior to the test.  . Take metoprolol (Lopressor) two hours prior to test. .   After the Test: . Drink plenty of water. . After receiving IV contrast, you may experience a mild flushed feeling. This is normal. . On occasion, you may experience a mild rash up to 24 hours after the test. This is not dangerous. If this occurs, you can take Benadryl 25 mg and increase your fluid intake. . If you experience trouble breathing, this can be serious. If it is severe call 911 IMMEDIATELY. If it is mild, please call our office. . If you take any of these medications: Glipizide/Metformin, Avandament, Glucavance, please do not take 48 hours after completing test unless otherwise instructed.   Once we have confirmed  authorization from your insurance company, we will call you to set up a date and time for your test.   For non-scheduling related questions, please contact the cardiac imaging nurse navigator should you have any questions/concerns: Marchia Bond, RN Navigator Cardiac Imaging [facility] [phone] mobile     Hi Mr.Shon Baton,  Please get lab work (BMET) 1 week before CT scan  Please pick up the Lopressor 100 mg tablet from your pharmacy. You will take it 2 hours before procedure.  Please call our office after you get apt for Cardiac CT. We will then make a follow up apt for you.   Thanks,   Mertha Finders, RN

## 2019-04-05 ENCOUNTER — Encounter: Payer: Self-pay | Admitting: Family Medicine

## 2019-04-05 ENCOUNTER — Ambulatory Visit (INDEPENDENT_AMBULATORY_CARE_PROVIDER_SITE_OTHER): Payer: 59 | Admitting: Family Medicine

## 2019-04-05 ENCOUNTER — Telehealth: Payer: Self-pay

## 2019-04-05 ENCOUNTER — Other Ambulatory Visit: Payer: Self-pay

## 2019-04-05 VITALS — BP 151/95 | HR 77 | Temp 98.6°F | Wt 212.0 lb

## 2019-04-05 DIAGNOSIS — B360 Pityriasis versicolor: Secondary | ICD-10-CM

## 2019-04-05 MED ORDER — SELENIUM SULFIDE 2.5 % EX LOTN: 1.0000 "application " | TOPICAL_LOTION | Freq: Every day | CUTANEOUS | 12 refills | Status: DC | PRN

## 2019-04-05 NOTE — Patient Instructions (Signed)
Apply to affected area daily-leave for 28min then rinse off for 1 week. May continue for 1 additional week if not completely resolved. May use in the future if noted again

## 2019-04-05 NOTE — Progress Notes (Signed)
Established Patient Office Visit  Subjective:  Patient ID: Evan Jacobson, male    DOB: 04-10-1965  Age: 54 y.o. MRN: [id number]  CC:  Chief Complaint  Patient presents with  . Rash    under arms,     HPI Evan Jacobson presents for pt has noted rash under arms for several weeks.  No itching. No pain. No papules. Hyperpigmented areas. Pt has not experienced previously. No skin concerns in the past. Pt is retired Nature conservation officer.  Past Medical History:  Diagnosis Date  . Allergic rhinitis   . Bipolar disorder ([place]) 08/25/2011  . Chronic back pain   . Concussion 2012  . GERD (gastroesophageal reflux disease)   . Hypertension   . PTSD (post-traumatic stress disorder)     Past Surgical History:  Procedure Laterality Date  . COLONOSCOPY N/A 08/12/2017   Procedure: COLONOSCOPY;  Surgeon: Rogene Houston, MD;  Location: [facility];  Service: Endoscopy;  Laterality: N/A;  730  . NO PAST SURGERIES      Family History  Problem Relation Age of Onset  . Stroke Father   . Hypertension Father   . Hypertension Mother        Evan Jacobson  . Heart disease Mother        Angina  . Hypertension Sister   . Hypertension Sister   . Hypertension Brother   . Hypertension Brother   . Colon cancer Neg Hx     Social History   Socioeconomic History  . Marital status: Married    Spouse name: Not on file  . Number of children: 2  . Years of education: Not on file  . Highest education level: Not on file  Occupational History    Comment: Audio video tech    Employer: [employer]  Tobacco Use  . Smoking status: Never Smoker  . Smokeless tobacco: Never Used  Substance and Sexual Activity  . Alcohol use: Yes    Comment: occ; six pack per week  . Drug use: No  . Sexual activity: Yes    Birth control/protection: None  Other Topics Concern  . Not on file  Social History Narrative  . Not on file   Social Determinants of Health   Financial Resource Strain:   . Difficulty of Paying Living  Expenses: Not on file  Food Insecurity:   . Worried About Charity fundraiser in the Last Year: Not on file  . Ran Out of Food in the Last Year: Not on file  Transportation Needs:   . Lack of Transportation (Medical): Not on file  . Lack of Transportation (Non-Medical): Not on file  Physical Activity:   . Days of Exercise per Week: Not on file  . Minutes of Exercise per Session: Not on file  Stress:   . Feeling of Stress : Not on file  Social Connections:   . Frequency of Communication with Friends and Family: Not on file  . Frequency of Social Gatherings with Friends and Family: Not on file  . Attends Religious Services: Not on file  . Active Member of Clubs or Organizations: Not on file  . Attends Archivist Meetings: Not on file  . Marital Status: Not on file  Intimate Partner Violence:   . Fear of Current or Ex-Partner: Not on file  . Emotionally Abused: Not on file  . Physically Abused: Not on file  . Sexually Abused: Not on file    Outpatient Medications Prior to Visit  Medication  Sig Dispense Refill  . amLODipine (NORVASC) 2.5 MG tablet Take 3 tablets (7.5 mg total) by mouth daily. 270 tablet 1  . cetirizine (ZYRTEC) 10 MG tablet Take 10 mg by mouth daily.    . cyclobenzaprine (FLEXERIL) 10 MG tablet Take 1 tablet (10 mg total) by mouth 2 (two) times daily as needed for muscle spasms. 20 tablet 0  . EPINEPHrine 0.3 mg/0.3 mL IJ SOAJ injection Inject 0.3 mg into the muscle once.     Marland Kitchen ibuprofen (ADVIL) 600 MG tablet Take 1 tablet (600 mg total) by mouth every 6 (six) hours as needed. 30 tablet 0  . Krill Oil 500 MG CAPS Take 500 mg by mouth daily.     . metoprolol tartrate (LOPRESSOR) 100 MG tablet Take 1 tablet (100 mg) 2 hours before CT scan 1 tablet 0  . Multiple Vitamin (MULTIVITAMIN WITH MINERALS) TABS tablet Take 2 tablets by mouth daily.      No facility-administered medications prior to visit.    Allergies  Allergen Reactions  . Sulfa Antibiotics  Anaphylaxis  . Ace Inhibitors Other (See Comments)    Feels as though throat is closing up intermittently  . Sulfasalazine Other (See Comments)    Tongue swelled   . Sulfonamide Derivatives Other (See Comments)    Tongue swelled    ROS Review of Systems  Skin: Positive for color change and rash.      Objective:    Physical Exam  Constitutional: He appears well-developed and well-nourished.  HENT:  Head: Normocephalic and atraumatic.  Cardiovascular: Normal rate and regular rhythm.  Pulmonary/Chest: Effort normal and breath sounds normal.  Skin: Rash noted.     Macular hyperpigmented axillary and back. No papules, no vesicles    BP (!) 151/95 (BP Location: Left Arm, Patient Position: Sitting)   Pulse 77   Temp 98.6 F (37 C) (Temporal)   Wt 212 lb (96.2 kg)   SpO2 98%   BMI 29.57 kg/m  Wt Readings from Last 3 Encounters:  04/05/19 212 lb (96.2 kg)  03/04/19 200 lb (90.7 kg)  11/29/18 200 lb (90.7 kg)     Lab Results  Component Value Date   TSH 0.74 07/22/2018   Lab Results  Component Value Date   WBC 4.3 07/22/2018   HGB 15.7 07/22/2018   HCT 46.0 07/22/2018   MCV 90.4 07/22/2018   PLT 152 07/22/2018   Lab Results  Component Value Date   NA 138 07/22/2018   K 4.4 07/22/2018   CO2 29 07/22/2018   GLUCOSE 92 07/22/2018   BUN 14 07/22/2018   CREATININE 1.38 (H) 07/22/2018   BILITOT 0.5 07/22/2018   ALKPHOS 57 06/18/2016   AST 25 07/22/2018   ALT 25 07/22/2018   PROT 7.6 07/22/2018   ALBUMIN 4.1 06/18/2016   CALCIUM 9.4 07/22/2018   ANIONGAP 7 12/31/2016   Lab Results  Component Value Date   CHOL 216 (H) 07/22/2018   Lab Results  Component Value Date   HDL 54 07/22/2018   Lab Results  Component Value Date   LDLCALC 138 (H) 07/22/2018   Lab Results  Component Value Date   TRIG 118 07/22/2018   Lab Results  Component Value Date   CHOLHDL 4.0 07/22/2018   Lab Results  Component Value Date   HGBA1C 5.2 01/26/2017       Assessment & Plan:   .1. Tinea versicolor Axillary and back Meds ordered this encounter  Medications  . selenium sulfide (SELSUN) 2.5 %  shampoo    Sig: Apply 1 application topically daily as needed.    Dispense:  118 mL    Refill:  12    Follow-up: prn-Dr Ascencion Dike, MD

## 2019-04-06 ENCOUNTER — Other Ambulatory Visit: Payer: Self-pay | Admitting: Family Medicine

## 2019-04-06 DIAGNOSIS — I10 Essential (primary) hypertension: Secondary | ICD-10-CM

## 2019-04-12 ENCOUNTER — Telehealth: Payer: Self-pay | Admitting: Orthopedic Surgery

## 2019-04-12 ENCOUNTER — Other Ambulatory Visit (HOSPITAL_COMMUNITY)
Admission: RE | Admit: 2019-04-12 | Discharge: 2019-04-12 | Disposition: A | Discharge status: Home / Self Care | Payer: 59 | Source: Ambulatory Visit | Attending: Cardiology | Admitting: Cardiology

## 2019-04-12 DIAGNOSIS — Z87898 Personal history of other specified conditions: Secondary | ICD-10-CM | POA: Insufficient documentation

## 2019-04-12 LAB — BASIC METABOLIC PANEL
Anion gap: 8 (ref 5–15)
BUN: 21 mg/dL — ABNORMAL HIGH (ref 6–20)
CO2: 27 mmol/L (ref 22–32)
Calcium: 9.2 mg/dL (ref 8.9–10.3)
Chloride: 103 mmol/L (ref 98–111)
Creatinine, Ser: 1.03 mg/dL (ref 0.61–1.24)
GFR calc Af Amer: >60 mL/min (ref >60)
GFR calc non Af Amer: >60 mL/min (ref >60)
Glucose, Bld: 91 mg/dL (ref 70–99)
Potassium: 4 mmol/L (ref 3.5–5.1)
Sodium: 138 mmol/L (ref 135–145)

## 2019-04-12 NOTE — Telephone Encounter (Signed)
Regarding upcoming scheduled appointment 04/20/19, I called patient to review insurance, as coverage of [facility] is indicated in system. Patient said he has had [place] insurance for years, but said [facility] Medicare is his coverage. Discussed the new plan of [place] protocol - patient said he will call the [place] and will get back with [place].

## 2019-04-12 NOTE — Telephone Encounter (Signed)
Patient called after contacting [place] clinic - said he called a patient advocate, as was unable to get through to [facility] care phone#; said was told that "everything should be the same" and that he can have the care done that he has been receiving from our clinic with his [employer]. Relayed I would let supervisor know.

## 2019-04-13 ENCOUNTER — Encounter (HOSPITAL_COMMUNITY): Payer: Self-pay

## 2019-04-13 ENCOUNTER — Telehealth (HOSPITAL_COMMUNITY): Payer: Self-pay | Admitting: Emergency Medicine

## 2019-04-13 NOTE — Telephone Encounter (Signed)
Reaching out to patient to offer assistance regarding upcoming cardiac imaging study; pt verbalizes understanding of appt date/time, parking situation and where to check in, pre-test NPO status and medications ordered, and verified current allergies; name and call back number provided for further questions should they arise Adrine Hayworth RN Navigator Cardiac Imaging [facility] [phone] office [phone] cell 

## 2019-04-14 ENCOUNTER — Ambulatory Visit (HOSPITAL_COMMUNITY)
Admission: RE | Admit: 2019-04-14 | Discharge: 2019-04-14 | Disposition: A | Discharge status: Home / Self Care | Payer: 59 | Source: Ambulatory Visit | Attending: Cardiology | Admitting: Cardiology

## 2019-04-14 ENCOUNTER — Encounter: Payer: 59 | Admitting: *Deleted

## 2019-04-14 ENCOUNTER — Other Ambulatory Visit: Payer: Self-pay

## 2019-04-14 VITALS — Temp 97.0°F

## 2019-04-14 DIAGNOSIS — I209 Angina pectoris, unspecified: Secondary | ICD-10-CM

## 2019-04-14 DIAGNOSIS — Z006 Encounter for examination for normal comparison and control in clinical research program: Secondary | ICD-10-CM

## 2019-04-14 MED ORDER — IOHEXOL 350 MG/ML SOLN
100.0000 mL | Freq: Once | INTRAVENOUS | Status: AC | PRN
  Administered 2019-04-14: 100 mL via INTRAVENOUS

## 2019-04-14 MED ORDER — NITROGLYCERIN 0.4 MG SL SUBL
SUBLINGUAL_TABLET | SUBLINGUAL | Status: AC
  Filled 2019-04-14: qty 2

## 2019-04-14 MED ORDER — NITROGLYCERIN 0.4 MG SL SUBL
0.8000 mg | SUBLINGUAL_TABLET | Freq: Once | SUBLINGUAL | Status: AC
  Administered 2019-04-14: 0.8 mg via SUBLINGUAL

## 2019-04-14 NOTE — Research (Signed)
Subject Name: Evan Jacobson  Subject met inclusion and exclusion criteria.  The informed consent form, study requirements and expectations were reviewed with the subject and questions and concerns were addressed prior to the signing of the consent form.  The subject verbalized understanding of the trial requirements.  The subject agreed to participate in the  trial and signed the informed consent at [place] on 04/14/19  The informed consent was obtained prior to performance of any protocol-specific procedures for the subject.  A copy of the signed informed consent was given to the subject and a copy was placed in the subject's medical record.   [name]

## 2019-04-20 ENCOUNTER — Other Ambulatory Visit: Payer: Self-pay

## 2019-04-20 ENCOUNTER — Encounter: Payer: Self-pay | Admitting: Orthopedic Surgery

## 2019-04-20 ENCOUNTER — Ambulatory Visit: Payer: 59 | Admitting: Orthopedic Surgery

## 2019-04-20 VITALS — BP 139/88 | HR 85 | Ht 71.0 in | Wt 208.0 lb

## 2019-04-20 DIAGNOSIS — M7542 Impingement syndrome of left shoulder: Secondary | ICD-10-CM

## 2019-04-20 DIAGNOSIS — G8929 Other chronic pain: Secondary | ICD-10-CM | POA: Diagnosis not present

## 2019-04-20 NOTE — Patient Instructions (Addendum)
Home exercises   Ibuprofen   These are the muscle and arthrits creams I recommend:  PLEASE READ THE PACKAGE INSTRUCTIONS BEFORE USING   Ben Gay arthritis cream  Icy hot vanishing gel  Aspercreme odor free  Myoflex Oderless pain reliever  Capzasin  Sportscreme  Max freeze Shoulder Impingement Syndrome  Shoulder impingement syndrome is a condition that causes pain when connective tissues (tendons) surrounding the shoulder joint become pinched. These tendons are part of the group of muscles and tissues that help to stabilize the shoulder (rotator cuff). Beneath the rotator cuff is a fluid-filled sac (bursa) that allows the muscles and tendons to glide smoothly. The bursa may become swollen or irritated (bursitis). Bursitis, swelling in the rotator cuff tendons, or both conditions can decrease how much space is under a bone in the shoulder joint (acromion), resulting in impingement. What are the causes? Shoulder impingement syndrome may be caused by bursitis or swelling of the rotator cuff tendons, which may result from:  Repetitive overhead arm movements.  Falling onto the shoulder.  Weakness in the shoulder muscles. What increases the risk? You may be more likely to develop this condition if you:  Play sports that involve throwing, such as baseball.  Participate in sports such as tennis, volleyball, and swimming.  Work as a Curator, Games developer, or Architect. Some people are also more likely to develop impingement syndrome because of the shape of their acromion bone. What are the signs or symptoms? The main symptom of this condition is pain on the front or side of the shoulder. The pain may:  Get worse when lifting or raising the arm.  Get worse at night.  Wake you up from sleeping.  Feel sharp when the shoulder is moved and then fade to an ache. Other symptoms may include:  Tenderness.  Stiffness.  Inability to raise the arm above shoulder level or behind the  body.  Weakness. How is this diagnosed? This condition may be diagnosed based on:  Your symptoms and medical history.  A physical exam.  Imaging tests, such as: ? X-rays. ? MRI. ? Ultrasound. How is this treated? This condition may be treated by:  Resting your shoulder and avoiding all activities that cause pain or put stress on the shoulder.  Icing your shoulder.  NSAIDs to help reduce pain and swelling.  One or more injections of medicines to numb the area and reduce inflammation.  Physical therapy.  Surgery. This may be needed if nonsurgical treatments have not helped. Surgery may involve repairing the rotator cuff, reshaping the acromion, or removing the bursa. Follow these instructions at home: Managing pain, stiffness, and swelling   If directed, put ice on the injured area. ? Put ice in a plastic bag. ? Place a towel between your skin and the bag. ? Leave the ice on for 20 minutes, 2-3 times a day. Activity  Rest and return to your normal activities as told by your health care provider. Ask your health care provider what activities are safe for you.  Do exercises as told by your health care provider. General instructions  Do not use any products that contain nicotine or tobacco, such as cigarettes, e-cigarettes, and chewing tobacco. These can delay healing. If you need help quitting, ask your health care provider.  Ask your health care provider when it is safe for you to drive.  Take over-the-counter and prescription medicines only as told by your health care provider.  Keep all follow-up visits as told by your  health care provider. This is important. How is this prevented?  Give your body time to rest between periods of activity.  Be safe and responsible while being active. This will help you avoid falls.  Maintain physical fitness, including strength and flexibility. Contact a health care provider if:  Your symptoms have not improved after 1-2  months of treatment and rest.  You cannot lift your arm away from your body. Summary  Shoulder impingement syndrome is a condition that causes pain when connective tissues (tendons) surrounding the shoulder joint become pinched.  The main symptom of this condition is pain on the front or side of the shoulder.  This condition is usually treated with rest, ice, and pain medicines as needed. This information is not intended to replace advice given to you by your health care provider. Make sure you discuss any questions you have with your health care provider. Document Revised: 05/07/2018 Document Reviewed: 07/08/2017 Elsevier Patient Education  2020 [employer].

## 2019-04-20 NOTE — Progress Notes (Signed)
Chief Complaint  Patient presents with  . Shoulder Pain    left/ wants injection     Status post injection left shoulder patient requests second injection last injection given November 2020  Previous MRI did not show full-thickness cuff tear.  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  He has full range of motion no real weakness but a positive impingement sign  Shoulder is stable  Encounter Diagnoses  Name Primary?  . Chronic left shoulder pain Yes  . Impingement syndrome of left shoulder     Recommend subacromial injection left shoulder, rotator cuff strengthening program, topical liniment and anti-inflammatory ibuprofen  Procedure note the subacromial injection shoulder left   Verbal consent was obtained to inject the  Left   Shoulder  Timeout was completed to confirm the injection site is a subacromial space of the  left  shoulder  Medication used Depo-Medrol 40 mg and lidocaine 1% 3 cc  Anesthesia was provided by ethyl chloride  The injection was performed in the left  posterior subacromial space. After pinning the skin with alcohol and anesthetized the skin with ethyl chloride the subacromial space was injected using a 20-gauge needle. There were no complications  Sterile dressing was applied.  Encounter Diagnoses  Name Primary?  . Chronic left shoulder pain Yes  . Impingement syndrome of left shoulder

## 2019-06-02 ENCOUNTER — Encounter: Payer: Self-pay | Admitting: Family Medicine

## 2019-06-02 ENCOUNTER — Ambulatory Visit (INDEPENDENT_AMBULATORY_CARE_PROVIDER_SITE_OTHER): Payer: 59 | Admitting: Family Medicine

## 2019-06-02 ENCOUNTER — Other Ambulatory Visit: Payer: Self-pay

## 2019-06-02 VITALS — BP 122/84 | HR 85 | Temp 97.1°F | Resp 15 | Ht 71.0 in | Wt 215.0 lb

## 2019-06-02 DIAGNOSIS — Z0001 Encounter for general adult medical examination with abnormal findings: Secondary | ICD-10-CM | POA: Diagnosis not present

## 2019-06-02 DIAGNOSIS — Z125 Encounter for screening for malignant neoplasm of prostate: Secondary | ICD-10-CM

## 2019-06-02 DIAGNOSIS — M25512 Pain in left shoulder: Secondary | ICD-10-CM | POA: Insufficient documentation

## 2019-06-02 DIAGNOSIS — I10 Essential (primary) hypertension: Secondary | ICD-10-CM | POA: Diagnosis not present

## 2019-06-02 DIAGNOSIS — F324 Major depressive disorder, single episode, in partial remission: Secondary | ICD-10-CM | POA: Diagnosis not present

## 2019-06-02 DIAGNOSIS — E663 Overweight: Secondary | ICD-10-CM

## 2019-06-02 DIAGNOSIS — Z13220 Encounter for screening for lipoid disorders: Secondary | ICD-10-CM

## 2019-06-02 DIAGNOSIS — E559 Vitamin D deficiency, unspecified: Secondary | ICD-10-CM

## 2019-06-02 NOTE — Assessment & Plan Note (Signed)
Discussed PSA screening (risks/benefits), recommended at least 30 minutes of aerobic activity at least 5 days/week; proper sunscreen use reviewed; healthy diet and alcohol recommendations (less than or equal to 2 drinks/day) reviewed; regular seatbelt use; changing batteries in smoke detectors. Immunization recommendations discussed.  Colonoscopy recommendations reviewed. ° °

## 2019-06-02 NOTE — Progress Notes (Signed)
Evan Jacobson is a 54 y.o. male who presents for annual wellness visit and follow-up on chronic medical conditions.  He has the following concerns: none   Immunization History  Administered Date(s) Administered  . Influenza, Quadrivalent, Recombinant, Inj, Pf 11/15/2018  . Td 04/13/2008  . Zoster Recombinat (Shingrix) 11/17/2017, 02/08/2018   Last colonoscopy: 07/2017 Last PSA: 06/2018 Dentist: May 17 cleaning has 3 bridges  Ophtho: glasses, yearly Exercise: hasn't been lifting due to shoulder, walking some  End of Life Discussion:  Patient does not have a living will and medical power of attorney   The patient denies anorexia, fever, weight changes, headaches,  vision loss, decreased hearing, ear pain, hoarseness, chest pain, palpitations, dizziness, syncope, dyspnea on exertion, cough, swelling, nausea, vomiting, diarrhea, constipation, abdominal pain, melena, hematochezia, indigestion/heartburn, hematuria, incontinence, erectile dysfunction, nocturia, weakened urine stream, dysuria, genital lesions, joint pains, numbness, tingling, weakness, tremor, suspicious skin lesions, depression, anxiety, abnormal bleeding/bruising, or enlarged lymph nodes  PHYSICAL EXAM:  BP 122/84   Pulse 85   Temp (!) 97.1 F (36.2 C)   Resp 15   Ht 5\' 11"  (1.803 m)   Wt 215 lb (97.5 kg)   SpO2 96%   BMI 29.99 kg/m   Physical Exam Vitals and nursing note reviewed.  Constitutional:      Appearance: Normal appearance. He is well-developed, well-groomed and overweight.  HENT:     Head: Normocephalic and atraumatic.     Right Ear: Hearing, tympanic membrane, ear canal and external ear normal.     Left Ear: Hearing, tympanic membrane, ear canal and external ear normal.     Ears:     Weber exam findings: does not lateralize.    Right Rinne: AC > BC.    Left Rinne: AC > BC.    Nose: Nose normal.     Mouth/Throat:     Lips: Pink.     Mouth: Mucous membranes are moist.     Pharynx: Oropharynx is  clear.  Eyes:     General: Lids are normal.        Right eye: No discharge.        Left eye: No discharge.     Extraocular Movements: Extraocular movements intact.     Conjunctiva/sclera: Conjunctivae normal.     Pupils: Pupils are equal, round, and reactive to light.  Neck:     Thyroid: No thyroid mass, thyromegaly or thyroid tenderness.  Cardiovascular:     Rate and Rhythm: Normal rate and regular rhythm.     Pulses: Normal pulses.     Heart sounds: Normal heart sounds.  Pulmonary:     Effort: Pulmonary effort is normal.     Breath sounds: Normal breath sounds.  Abdominal:     General: Bowel sounds are normal. There is no distension.     Palpations: Abdomen is soft.     Tenderness: There is no abdominal tenderness.     Comments: Appears to have small ventral hernia  Musculoskeletal:     Right shoulder: Normal.     Left shoulder: Tenderness present. Decreased range of motion.     Cervical back: Normal, normal range of motion and neck supple.     Thoracic back: Normal.     Lumbar back: Decreased range of motion.     Right lower leg: No edema.     Left lower leg: No edema.  Lymphadenopathy:     Cervical: No cervical adenopathy.  Skin:    General: Skin is warm and  dry.     Capillary Refill: Capillary refill takes less than 2 seconds.  Neurological:     General: No focal deficit present.     Mental Status: He is alert and oriented to person, place, and time.     Cranial Nerves: Cranial nerves are intact.     Sensory: Sensation is intact.     Motor: Motor function is intact.     Coordination: Coordination is intact.     Gait: Gait is intact.     Deep Tendon Reflexes: Reflexes are normal and symmetric.  Psychiatric:        Attention and Perception: Attention and perception normal.        Mood and Affect: Mood and affect normal.        Speech: Speech normal.        Behavior: Behavior is cooperative.        Thought Content: Thought content normal.        Cognition and  Memory: Cognition and memory normal.        Judgment: Judgment normal.    Depression screen [facility] 2/9 06/02/2019 11/02/2018 10/06/2018 07/22/2018 06/02/2018  Decreased Interest 0 0 1 2 0  Down, Depressed, Hopeless 0 1 2 3 1   PHQ - 2 Score 0 1 3 5 1   Altered sleeping 1 - 2 3 -  Tired, decreased energy 1 - 2 3 -  Change in appetite 0 - 0 0 -  Feeling bad or failure about yourself  0 - 1 0 -  Trouble concentrating 0 - 3 1 -  Moving slowly or fidgety/restless 0 - 3 0 -  Suicidal thoughts 0 - 0 0 -  PHQ-9 Score 2 - 14 12 -  Difficult doing work/chores Somewhat difficult - - Somewhat difficult -  Some recent data might be hidden     ASSESSMENT/PLAN:  Please see problem list for assessment and plan detailed  1. Annual visit for general adult medical examination with abnormal findings  - CBC - COMPLETE METABOLIC PANEL WITH GFR - Hemoglobin A1c - Lipid panel - PSA - TSH  2. Depression, major, single episode, in partial remission ([place])   3. Acute pain of left shoulder   4. Essential hypertension  - CBC - COMPLETE METABOLIC PANEL WITH GFR  5. Overweight (BMI 25.0-29.9)  - CBC - COMPLETE METABOLIC PANEL WITH GFR - Hemoglobin A1c - Lipid panel  6. Vitamin D deficiency  - VITAMIN D 25 Hydroxy (Vit-D Deficiency, Fractures)  7. Lipid screening  - Lipid panel  8. Encounter for screening for malignant neoplasm of prostate  - PSA    I have personally reviewed: The patient's medical and social history Their use of alcohol, tobacco or illicit drugs Their current medications and supplements The patient's functional ability including ADLs,fall risks, home safety risks, cognitive, and hearing and visual impairment Diet and physical activities Evidence for depression or mood disorders  The patient's weight, height, BMI, and visual acuity have been recorded in the chart.  I have made referrals, counseling, and provided education to the patient based on review of the above and I  have provided the patient with a written personalized care plan for preventive services.     Perlie Mayo, NP   06/02/2019

## 2019-06-02 NOTE — Patient Instructions (Addendum)
I appreciate the opportunity to provide you with care for your health and wellness. Today we discussed: overall health   Follow up: 6 months   Labs in next week fasting No referrals today  HEALTH MAINTENANCE RECOMMENDATIONS:  It is recommended that you get at least 30 minutes of aerobic exercise at least 5 days/week (for weight loss, you may need as much as 60-90 minutes). This can be any activity that gets your heart rate up. This can be divided in 10-15 minute intervals if needed, but try and build up your endurance at least once a week.  Weight bearing exercise is also recommended twice weekly.  Eat a healthy diet with lots of vegetables, fruits and fiber.  "Colorful" foods have a lot of vitamins (ie green vegetables, tomatoes, red peppers, etc).  Limit sweet tea, regular sodas and alcoholic beverages, all of which has a lot of calories and sugar.  Up to 2 alcoholic drinks daily may be beneficial for men (unless trying to lose weight, watch sugars).  Drink a lot of water.  Sunscreen of at least SPF 30 should be used on all sun-exposed parts of the skin when outside between the hours of 10 am and 4 pm (not just when at beach or pool, but even with exercise, golf, tennis, and yard work!)  Use a sunscreen that says "broad spectrum" so it covers both UVA and UVB rays, and make sure to reapply every 1-2 hours.  Remember to change the batteries in your smoke detectors when changing your clock times in the spring and fall.  Use your seat belt every time you are in a car, and please drive safely and not be distracted with cell phones and texting while driving.  Please continue to practice social distancing to keep you, your family, and our community safe.  If you must go out, please wear a mask and practice good handwashing.  It was a pleasure to see you and I look forward to continuing to work together on your health and well-being. Please do not hesitate to call the office if you need care or  have questions about your care.  Have a wonderful day and week. With Gratitude, Cherly Beach, DNP, AGNP-BC

## 2019-06-02 NOTE — Assessment & Plan Note (Signed)
History of vitamin D deficiency.  Will be getting updated labs and see if he needs supplementation.

## 2019-06-02 NOTE — Assessment & Plan Note (Signed)
Evan Jacobson is encouraged to maintain a well balanced diet that is low in salt. Controlled, continue current medication regimen. Additionally, he is also reminded that exercise is beneficial for heart health and control of  Blood pressure. 30-60 minutes daily is recommended-walking was suggested.

## 2019-06-02 NOTE — Assessment & Plan Note (Signed)
Low-fat diet encouraged, updated labs ordered. 

## 2019-06-02 NOTE — Assessment & Plan Note (Signed)
Slowly improved, following with Ortho now.  He is encouraged to continue strengthen exercises.

## 2019-06-02 NOTE — Assessment & Plan Note (Signed)
PHQ 2, much improved. Continue Buspar as needed. Denies SI and HI

## 2019-06-02 NOTE — Assessment & Plan Note (Signed)
Evan Jacobson is re-educated about the importance of exercise daily to help with weight management. A minumum of 30 minutes daily is recommended. Additionally, importance of healthy food choices  with portion control discussed.  Wt Readings from Last 3 Encounters:  06/02/19 215 lb (97.5 kg)  04/20/19 208 lb (94.3 kg)  04/05/19 212 lb (96.2 kg)

## 2019-06-03 ENCOUNTER — Telehealth: Payer: Self-pay

## 2019-06-03 NOTE — Telephone Encounter (Signed)
Pt advised of recommendation with verbal understanding  

## 2019-06-03 NOTE — Telephone Encounter (Signed)
Called pt to see when this started he stated Tuesday. I asked pt what he had tried over the counter he stated "nothing dr simpson usually calls him in an antibiotic". I let him know we would need to try something over the counter first and he stated "let me stop lying I have tried the over the counter medication its not working" please advise what you would recommend

## 2019-06-03 NOTE — Telephone Encounter (Signed)
Pt stopped by to see if you would call him in a antibiotic, he thinks he is getting a sinus infection , he was just seen yesterday .Marland Kitchen He forgot to mention that to you

## 2019-06-03 NOTE — Telephone Encounter (Signed)
He is does not met the criteria for antibiotics. I suggest continued Mucinex, Flonase, and Claritin or alike daily. Encourage increase in fluids.

## 2019-06-04 LAB — LIPID PANEL
Cholesterol: 170 mg/dL (ref <200)
HDL: 73 mg/dL (ref >=40)
LDL Cholesterol (Calc): 84 mg/dL (calc)
Non-HDL Cholesterol (Calc): 97 mg/dL (calc) (ref <130)
Total CHOL/HDL Ratio: 2.3 (calc) (ref <5.0)
Triglycerides: 45 mg/dL (ref <150)

## 2019-06-04 LAB — COMPLETE METABOLIC PANEL WITH GFR
AG Ratio: 1.3 (calc) (ref 1.0–2.5)
ALT: 17 U/L (ref 9–46)
AST: 17 U/L (ref 10–35)
Albumin: 3.9 g/dL (ref 3.6–5.1)
Alkaline phosphatase (APISO): 57 U/L (ref 35–144)
BUN: 13 mg/dL (ref 7–25)
CO2: 28 mmol/L (ref 20–32)
Calcium: 9 mg/dL (ref 8.6–10.3)
Chloride: 105 mmol/L (ref 98–110)
Creat: 1.09 mg/dL (ref 0.70–1.33)
GFR, Est African American: 90 mL/min/{1.73_m2} (ref >=60)
GFR, Est Non African American: 78 mL/min/{1.73_m2} (ref >=60)
Globulin: 3 g/dL (calc) (ref 1.9–3.7)
Glucose, Bld: 91 mg/dL (ref 65–99)
Potassium: 4 mmol/L (ref 3.5–5.3)
Sodium: 140 mmol/L (ref 135–146)
Total Bilirubin: 0.6 mg/dL (ref 0.2–1.2)
Total Protein: 6.9 g/dL (ref 6.1–8.1)

## 2019-06-04 LAB — CBC
HCT: 41.8 % (ref 38.5–50.0)
Hemoglobin: 14 g/dL (ref 13.2–17.1)
MCH: 30.7 pg (ref 27.0–33.0)
MCHC: 33.5 g/dL (ref 32.0–36.0)
MCV: 91.7 fL (ref 80.0–100.0)
MPV: 12.3 fL (ref 7.5–12.5)
Platelets: 135 10*3/uL — ABNORMAL LOW (ref 140–400)
RBC: 4.56 10*6/uL (ref 4.20–5.80)
RDW: 12.6 % (ref 11.0–15.0)
WBC: 5.6 10*3/uL (ref 3.8–10.8)

## 2019-06-04 LAB — VITAMIN D 25 HYDROXY (VIT D DEFICIENCY, FRACTURES): Vit D, 25-Hydroxy: 40 ng/mL (ref 30–100)

## 2019-06-04 LAB — HEMOGLOBIN A1C
Hgb A1c MFr Bld: 5 % of total Hgb (ref <5.7)
Mean Plasma Glucose: 97 (calc)
eAG (mmol/L): 5.4 (calc)

## 2019-06-04 LAB — TSH: TSH: 1.57 mIU/L (ref 0.40–4.50)

## 2019-06-04 LAB — PSA: PSA: 1 ng/mL (ref <=4.0)

## 2019-06-09 ENCOUNTER — Telehealth: Payer: Self-pay

## 2019-06-09 NOTE — Telephone Encounter (Signed)
Follow up # for [facility] if needed [phone]  Also needs notes from 06/02/19 visit faxed to [phone]

## 2019-06-13 NOTE — Telephone Encounter (Signed)
Notes faxed in as requested

## 2019-08-16 ENCOUNTER — Telehealth: Payer: Self-pay | Admitting: Family Medicine

## 2019-08-16 NOTE — Telephone Encounter (Signed)
Patient would like someone to call him back today if possible regarding labs that he would like done before tomorrow (08/17/19) appt. He would like someone to call him back to discuss. [phone]

## 2019-08-17 ENCOUNTER — Encounter: Payer: Self-pay | Admitting: Family Medicine

## 2019-08-17 ENCOUNTER — Other Ambulatory Visit: Payer: Self-pay

## 2019-08-17 ENCOUNTER — Ambulatory Visit (INDEPENDENT_AMBULATORY_CARE_PROVIDER_SITE_OTHER): Payer: 59 | Admitting: Family Medicine

## 2019-08-17 VITALS — BP 133/89 | HR 87 | Resp 16 | Ht 71.0 in | Wt 221.0 lb

## 2019-08-17 DIAGNOSIS — R000 Tachycardia, unspecified: Secondary | ICD-10-CM | POA: Diagnosis not present

## 2019-08-17 DIAGNOSIS — I10 Essential (primary) hypertension: Secondary | ICD-10-CM

## 2019-08-17 DIAGNOSIS — M25531 Pain in right wrist: Secondary | ICD-10-CM | POA: Diagnosis not present

## 2019-08-17 DIAGNOSIS — E663 Overweight: Secondary | ICD-10-CM

## 2019-08-17 DIAGNOSIS — I998 Other disorder of circulatory system: Secondary | ICD-10-CM

## 2019-08-17 DIAGNOSIS — F324 Major depressive disorder, single episode, in partial remission: Secondary | ICD-10-CM

## 2019-08-17 NOTE — Telephone Encounter (Signed)
Pt can discuss labs he is wanting at appt today and if appropriate she will order at that time

## 2019-08-17 NOTE — Progress Notes (Signed)
Evan Jacobson     MRN: [id number]      DOB: 1965/04/12   HPI Evan Jacobson is here with a 3 day h/o rapid heart rate up to as much as 165, with light headedness, over the past 3 days between 106 to 124, states was as high as 150. Denies chest pain, PND or orthopnea or leg welling Denies any new medication, excess caffeine or increased stress, no known trigger  ROS Denies recent fever or chills. Denies sinus pressure, nasal congestion, ear pain or sore throat. Denies chest congestion, productive cough or wheezing.  Denies abdominal pain, nausea, vomiting,diarrhea or constipation.   Denies dysuria, frequency, hesitancy or incontinence. Denies joint pain, swelling and limitation in mobility. Denies headaches, seizures, numbness, or tingling. Denies depression, anxiety or insomnia. Denies skin break down or rash.   PE  BP 133/89   Pulse 87   Resp 16   Ht 5\' 11"  (1.803 m)   Wt 221 lb 0.6 oz (100.3 kg)   SpO2 99%   BMI 30.83 kg/m   Patient alert and oriented and in no cardiopulmonary distress.  HEENT: No facial asymmetry, EOMI,     Neck supple .  Chest: Clear to auscultation bilaterally.  CVS: S1, S2 no murmurs, no S3.Regular rate.  ABD: Soft non tender.   Ext: No edema  MS: Adequate ROM spine, shoulders, hips and knees. Tender over distal aspect right wrist Skin: Intact, no ulcerations or rash noted.  Psych: Good eye contact, normal affect. Memory intact not anxious or depressed appearing.  CNS: CN 2-12 intact, power,  normal throughout.no focal deficits noted.   Assessment & Plan  Tachycardia 4 day h/O new onset tachycardia, symptomatic , reports hR of 1 60,normal rate and blood pressure at visit refer to Cardiology, advised ED if recurrent severe symptoms. Start aspirin  Wrist pain, right Intermittent right wrist pain and weakness with spasm of the digits needs orthopedic evaluation  Essential hypertension Sub optimal control , no med change . Cardiology  to eval DASH diet and commitment to daily physical activity for a minimum of 30 minutes discussed and encouraged, as a part of hypertension management. The importance of attaining a healthy weight is also discussed.  BP/Weight 08/17/2019 06/02/2019 04/20/2019 04/14/2019 04/05/2019 0/06/3014 0/01/930  Systolic BP 355 732 202 542 706 237 628  Diastolic BP 89 84 88 85 95 95 81  Wt. (Lbs) 221.04 215 208 - 212 200 -  BMI 30.83 29.99 29.01 - 29.57 27.89 -  Some encounter information is confidential and restricted. Go to Review Flowsheets activity to see all data.       Overweight (BMI 25.0-29.9)  Patient re-educated about  the importance of commitment to a  minimum of 150 minutes of exercise per week as able.  The importance of healthy food choices with portion control discussed, as well as eating regularly and within a 12 hour window most days. The need to choose "clean , green" food 50 to 75% of the time is discussed, as well as to make water the primary drink and set a goal of 64 ounces water daily.    Weight /BMI 08/17/2019 06/02/2019 04/20/2019  WEIGHT 221 lb 0.6 oz 215 lb 208 lb  HEIGHT 5\' 11"  5\' 11"  5\' 11"   BMI 30.83 kg/m2 29.99 kg/m2 29.01 kg/m2  Some encounter information is confidential and restricted. Go to Review Flowsheets activity to see all data.      Depression, major, single episode, in partial remission ([place]) Managed  by Psych through the [place], states stable

## 2019-08-17 NOTE — Patient Instructions (Signed)
Follow-up in November as before call if you need me sooner.  Please start baby aspirin 81 mg once daily.  If you develop symptoms of rapid heart rate as you have described with lightheadedness you need to go to the nearest emergency room.  You are referred urgently to cardiology we will give you appointment information.  You are referred to orthopedics we intermittent right wrist pain with tenderness and reduced mobility as well as spasm of the fingers.  It is important that you exercise regularly at least 30 minutes 5 times a week. If you develop chest pain, have severe difficulty breathing, or feel very tired, stop exercising immediately and seek medical attention  Thanks for choosing [facility], we consider it a privelige to serve you.

## 2019-08-17 NOTE — Assessment & Plan Note (Addendum)
Intermittent right wrist pain and weakness with spasm of the digits needs orthopedic evaluation

## 2019-08-17 NOTE — Assessment & Plan Note (Addendum)
4 day h/O new onset tachycardia, symptomatic , reports hR of 1 60,normal rate and blood pressure at visit refer to Cardiology, advised ED if recurrent severe symptoms. Start aspirin

## 2019-08-21 ENCOUNTER — Encounter: Payer: Self-pay | Admitting: Family Medicine

## 2019-08-21 NOTE — Assessment & Plan Note (Signed)
Managed by Psych through the [place], states stable

## 2019-08-21 NOTE — Assessment & Plan Note (Signed)
  Patient re-educated about  the importance of commitment to a  minimum of 150 minutes of exercise per week as able.  The importance of healthy food choices with portion control discussed, as well as eating regularly and within a 12 hour window most days. The need to choose "clean , green" food 50 to 75% of the time is discussed, as well as to make water the primary drink and set a goal of 64 ounces water daily.    Weight /BMI 08/17/2019 06/02/2019 04/20/2019  WEIGHT 221 lb 0.6 oz 215 lb 208 lb  HEIGHT 5\' 11"  5\' 11"  5\' 11"   BMI 30.83 kg/m2 29.99 kg/m2 29.01 kg/m2  Some encounter information is confidential and restricted. Go to Review Flowsheets activity to see all data.

## 2019-08-21 NOTE — Assessment & Plan Note (Signed)
Sub optimal control , no med change . Cardiology to eval DASH diet and commitment to daily physical activity for a minimum of 30 minutes discussed and encouraged, as a part of hypertension management. The importance of attaining a healthy weight is also discussed.  BP/Weight 08/17/2019 06/02/2019 04/20/2019 04/14/2019 04/05/2019 09/28/6943 0/03/8880  Systolic BP 800 349 179 150 569 794 801  Diastolic BP 89 84 88 85 95 95 81  Wt. (Lbs) 221.04 215 208 - 212 200 -  BMI 30.83 29.99 29.01 - 29.57 27.89 -  Some encounter information is confidential and restricted. Go to Review Flowsheets activity to see all data.

## 2019-08-23 ENCOUNTER — Encounter: Payer: Self-pay | Admitting: Orthopedic Surgery

## 2019-08-23 ENCOUNTER — Ambulatory Visit: Payer: 59 | Admitting: Family Medicine

## 2019-10-05 ENCOUNTER — Ambulatory Visit: Payer: 59 | Admitting: Cardiology

## 2019-10-13 ENCOUNTER — Ambulatory Visit (INDEPENDENT_AMBULATORY_CARE_PROVIDER_SITE_OTHER): Payer: 59 | Admitting: Internal Medicine

## 2019-10-13 ENCOUNTER — Other Ambulatory Visit: Payer: Self-pay

## 2019-10-13 ENCOUNTER — Encounter: Payer: Self-pay | Admitting: Internal Medicine

## 2019-10-13 VITALS — BP 132/98 | HR 73 | Resp 16 | Ht 71.0 in | Wt 215.0 lb

## 2019-10-13 DIAGNOSIS — R42 Dizziness and giddiness: Secondary | ICD-10-CM

## 2019-10-13 DIAGNOSIS — I10 Essential (primary) hypertension: Secondary | ICD-10-CM

## 2019-10-13 DIAGNOSIS — N529 Male erectile dysfunction, unspecified: Secondary | ICD-10-CM

## 2019-10-13 DIAGNOSIS — R000 Tachycardia, unspecified: Secondary | ICD-10-CM | POA: Diagnosis not present

## 2019-10-13 DIAGNOSIS — F4310 Post-traumatic stress disorder, unspecified: Secondary | ICD-10-CM

## 2019-10-13 DIAGNOSIS — F528 Other sexual dysfunction not due to a substance or known physiological condition: Secondary | ICD-10-CM

## 2019-10-13 MED ORDER — SILDENAFIL CITRATE 100 MG PO TABS: 100.0000 mg | ORAL_TABLET | ORAL | 11 refills | Status: DC | PRN

## 2019-10-13 NOTE — Assessment & Plan Note (Signed)
Patient uses Sildenafil as needed. Continue Sildenafil

## 2019-10-13 NOTE — Assessment & Plan Note (Signed)
Episodes of lightheadedness and dizziness, especially in hot environment Resting tachycardia No c/o room spinning Referral to Cardiology

## 2019-10-13 NOTE — Progress Notes (Signed)
Established Patient Office Visit  Subjective:  Patient ID: Evan Jacobson, male    DOB: 12/08/1965  Age: 54 y.o. MRN: [id number]  CC:  Chief Complaint  Patient presents with  . Dizziness    pt states his resting pulse has been running high.    HPI Evan Jacobson presents for evaluation for dizziness episodes. Patient states that he has been having such episodes while sitting as well. Patient denies any room spinning sensation. Patient denies any episodes of syncope recently. Patient denies chest pain, dyspnea, vision problems, tinnitus. Patient's smartwatch at home shows HR as high as 140s while resting. Patient has been evaluated by Neurology and Cardiology in the past.  In office, HR is 73 and BP is 132/98.  Patient has also h/o PTSD, not on any medications, managed by Psychiatrist. Patient denies flashbacks while having episodes of dizziness.  Past Medical History:  Diagnosis Date  . Allergic rhinitis   . Bipolar disorder ([place]) 08/25/2011  . Chronic back pain   . Concussion 2012  . GERD (gastroesophageal reflux disease)   . Groin pain, chronic, right 06/28/2014  . Heart palpitations 06/18/2016  . Hypertension   . Persistent adjustment disorder 12/05/2015   Chronic adjustment disorder diagnosed by [facility] and counseling recomended  . PTSD (post-traumatic stress disorder)   . Sinusitis 08/13/2007   Qualifier: Diagnosis of  By: Lenn Cal    . Skin lump of leg, left 11/17/2017  . Vertigo 01/08/2017    Past Surgical History:  Procedure Laterality Date  . COLONOSCOPY N/A 08/12/2017   Procedure: COLONOSCOPY;  Surgeon: Rogene Houston, MD;  Location: [facility] SU[facility];  Service: Endoscopy;  Laterality: N/A;  730  . NO PAST SURGERIES      Family History  Problem Relation Age of Onset  . Stroke Father   . Hypertension Father   . Hypertension Mother        [name]  . Heart disease Mother        Angina  . Hypertension Sister   . Hypertension Sister   . Hypertension Brother   .  Hypertension Brother   . Colon cancer Neg Hx     Social History   Socioeconomic History  . Marital status: Married    Spouse name: Not on file  . Number of children: 2  . Years of education: Not on file  . Highest education level: Not on file  Occupational History    Comment: Audio video tech    Employer: [employer]  Tobacco Use  . Smoking status: Never Smoker  . Smokeless tobacco: Never Used  Vaping Use  . Vaping Use: Never used  Substance and Sexual Activity  . Alcohol use: Yes    Comment: occ; six pack per week  . Drug use: No  . Sexual activity: Yes    Birth control/protection: None  Other Topics Concern  . Not on file  Social History Narrative  . Not on file   Social Determinants of Health   Financial Resource Strain:   . Difficulty of Paying Living Expenses: Not on file  Food Insecurity:   . Worried About Charity fundraiser in the Last Year: Not on file  . Ran Out of Food in the Last Year: Not on file  Transportation Needs:   . Lack of Transportation (Medical): Not on file  . Lack of Transportation (Non-Medical): Not on file  Physical Activity:   . Days of Exercise per Week: Not on file  . Minutes  of Exercise per Session: Not on file  Stress:   . Feeling of Stress : Not on file  Social Connections:   . Frequency of Communication with Friends and Family: Not on file  . Frequency of Social Gatherings with Friends and Family: Not on file  . Attends Religious Services: Not on file  . Active Member of Clubs or Organizations: Not on file  . Attends Archivist Meetings: Not on file  . Marital Status: Not on file  Intimate Partner Violence:   . Fear of Current or Ex-Partner: Not on file  . Emotionally Abused: Not on file  . Physically Abused: Not on file  . Sexually Abused: Not on file    Outpatient Medications Prior to Visit  Medication Sig Dispense Refill  . amLODipine (NORVASC) 2.5 MG tablet TAKE 3 TABLETS (7.5 MG TOTAL) BY MOUTH DAILY. 270  tablet 1  . cetirizine (ZYRTEC) 10 MG tablet Take 10 mg by mouth daily.    . cyclobenzaprine (FLEXERIL) 10 MG tablet Take 1 tablet (10 mg total) by mouth 2 (two) times daily as needed for muscle spasms. 20 tablet 0  . EPINEPHrine 0.3 mg/0.3 mL IJ SOAJ injection Inject 0.3 mg into the muscle once.     Marland Kitchen ibuprofen (ADVIL) 600 MG tablet Take 1 tablet (600 mg total) by mouth every 6 (six) hours as needed. 30 tablet 0  . Krill Oil 500 MG CAPS Take 500 mg by mouth daily.     . Multiple Vitamin (MULTIVITAMIN WITH MINERALS) TABS tablet Take 2 tablets by mouth daily.      No facility-administered medications prior to visit.    Allergies  Allergen Reactions  . Sulfa Antibiotics Anaphylaxis  . Ace Inhibitors Other (See Comments)    Feels as though throat is closing up intermittently  . Sulfasalazine Other (See Comments)    Tongue swelled   . Sulfonamide Derivatives Other (See Comments)    Tongue swelled    ROS Review of Systems  Constitutional: Negative for chills and fever.  HENT: Negative for congestion and sore throat.   Eyes: Negative for pain and discharge.  Respiratory: Negative for cough and shortness of breath.   Cardiovascular: Positive for palpitations. Negative for chest pain.  Gastrointestinal: Negative for constipation, diarrhea, nausea and vomiting.  Endocrine: Negative for polydipsia and polyuria.  Genitourinary: Negative for dysuria and hematuria.  Musculoskeletal: Negative for neck pain and neck stiffness.  Skin: Negative for rash.  Neurological: Positive for dizziness. Negative for weakness, numbness and headaches.  Psychiatric/Behavioral: Negative for agitation and behavioral problems.      Objective:    Physical Exam Vitals reviewed.  Constitutional:      General: He is not in acute distress.    Appearance: He is not diaphoretic.  HENT:     Head: Normocephalic and atraumatic.     Nose: Nose normal.     Mouth/Throat:     Mouth: Mucous membranes are moist.   Eyes:     General: No scleral icterus.    Extraocular Movements: Extraocular movements intact.     Pupils: Pupils are equal, round, and reactive to light.  Cardiovascular:     Rate and Rhythm: Normal rate and regular rhythm.     Heart sounds: No murmur heard.   Pulmonary:     Breath sounds: Normal breath sounds. No wheezing or rales.  Abdominal:     Palpations: Abdomen is soft.     Tenderness: There is no abdominal tenderness.  Musculoskeletal:  Cervical back: Neck supple. No tenderness.     Right lower leg: No edema.     Left lower leg: No edema.  Skin:    General: Skin is warm.     Findings: No rash.  Neurological:     General: No focal deficit present.     Mental Status: He is alert and oriented to person, place, and time.     Cranial Nerves: No cranial nerve deficit.     Sensory: No sensory deficit.  Psychiatric:        Mood and Affect: Mood normal.        Behavior: Behavior normal.     BP (!) 132/98   Pulse 73   Resp 16   Ht 5\' 11"  (1.803 m)   Wt 215 lb (97.5 kg)   SpO2 98%   BMI 29.99 kg/m  Wt Readings from Last 3 Encounters:  10/13/19 215 lb (97.5 kg)  08/17/19 221 lb 0.6 oz (100.3 kg)  06/02/19 215 lb (97.5 kg)     Health Maintenance Due  Topic Date Due  . Hepatitis C Screening  Never done  . INFLUENZA VACCINE  08/28/2019    There are no preventive care reminders to display for this patient.  Lab Results  Component Value Date   TSH 1.57 06/03/2019   Lab Results  Component Value Date   WBC 5.6 06/03/2019   HGB 14.0 06/03/2019   HCT 41.8 06/03/2019   MCV 91.7 06/03/2019   PLT 135 (L) 06/03/2019   Lab Results  Component Value Date   NA 140 06/03/2019   K 4.0 06/03/2019   CO2 28 06/03/2019   GLUCOSE 91 06/03/2019   BUN 13 06/03/2019   CREATININE 1.09 06/03/2019   BILITOT 0.6 06/03/2019   ALKPHOS 57 06/18/2016   AST 17 06/03/2019   ALT 17 06/03/2019   PROT 6.9 06/03/2019   ALBUMIN 4.1 06/18/2016   CALCIUM 9.0 06/03/2019    ANIONGAP 8 04/12/2019   Lab Results  Component Value Date   CHOL 170 06/03/2019   Lab Results  Component Value Date   HDL 73 06/03/2019   Lab Results  Component Value Date   LDLCALC 84 06/03/2019   Lab Results  Component Value Date   TRIG 45 06/03/2019   Lab Results  Component Value Date   CHOLHDL 2.3 06/03/2019   Lab Results  Component Value Date   HGBA1C 5.0 06/03/2019      Assessment & Plan:   Dizziness Episodes of lightheadedness and dizziness, especially in hot environment Resting tachycardia No c/o room spinning Referral to Cardiology  ERECTILE DYSFUNCTION Patient uses Sildenafil as needed. Continue Sildenafil  Essential hypertension Well-controlled with Amlodipine DASH diet and light exercise advised  PTSD (post-traumatic stress disorder) Managed by [facility] Psychiatrist.  Tachycardia Patient reports resting tachycardia along with dizziness at times. HR in office around 78, but patient uses smartwatch that monitors HR at home, which shows HR as high as 140s Had had Cardiology evaluation in the past. Referral to Cardiology for possible Holter monitor/loop recorder evaluation EKG reviewed in office, sinus rhythm F/u CMP and HbA1C   Meds ordered this encounter  Medications  . sildenafil (VIAGRA) 100 MG tablet    Sig: Take 1 tablet (100 mg total) by mouth as needed for erectile dysfunction.    Dispense:  6 tablet    Refill:  11    Follow-up: Return in about 4 weeks (around 11/10/2019).    Lindell Spar, MD

## 2019-10-13 NOTE — Assessment & Plan Note (Signed)
Patient reports resting tachycardia along with dizziness at times. HR in office around 78, but patient uses smartwatch that monitors HR at home, which shows HR as high as 140s Had had Cardiology evaluation in the past. Referral to Cardiology for possible Holter monitor/loop recorder evaluation

## 2019-10-13 NOTE — Assessment & Plan Note (Signed)
Well-controlled with Amlodipine DASH diet and light exercise advised

## 2019-10-13 NOTE — Assessment & Plan Note (Signed)
Managed by [facility] Psychiatrist.

## 2019-10-13 NOTE — Patient Instructions (Addendum)
Please follow up with Cardiology. Avoid heavily exertional activities. Small frequent-meals. Stay hydrated.  Dizziness Dizziness is a common problem. It is a feeling of unsteadiness or light-headedness. You may feel like you are about to faint. Dizziness can lead to injury if you stumble or fall. Anyone can become dizzy, but dizziness is more common in older adults. This condition can be caused by a number of things, including medicines, dehydration, or illness. Follow these instructions at home: Eating and drinking  Drink enough fluid to keep your urine clear or pale yellow. This helps to keep you from becoming dehydrated. Try to drink more clear fluids, such as water.  Do not drink alcohol.  Limit your caffeine intake if told to do so by your health care provider. Check ingredients and nutrition facts to see if a food or beverage contains caffeine.  Limit your salt (sodium) intake if told to do so by your health care provider. Check ingredients and nutrition facts to see if a food or beverage contains sodium. Activity  Avoid making quick movements. ? Rise slowly from chairs and steady yourself until you feel okay. ? In the morning, first sit up on the side of the bed. When you feel okay, stand slowly while you hold onto something until you know that your balance is fine.  If you need to stand in one place for a long time, move your legs often. Tighten and relax the muscles in your legs while you are standing.  Do not drive or use heavy machinery if you feel dizzy.  Avoid bending down if you feel dizzy. Place items in your home so that they are easy for you to reach without leaning over. Lifestyle  Do not use any products that contain nicotine or tobacco, such as cigarettes and e-cigarettes. If you need help quitting, ask your health care provider.  Try to reduce your stress level by using methods such as yoga or meditation. Talk with your health care provider if you need help to  manage your stress. General instructions  Watch your dizziness for any changes.  Take over-the-counter and prescription medicines only as told by your health care provider. Talk with your health care provider if you think that your dizziness is caused by a medicine that you are taking.  Tell a friend or a family member that you are feeling dizzy. If he or she notices any changes in your behavior, have this person call your health care provider.  Keep all follow-up visits as told by your health care provider. This is important. Contact a health care provider if:  Your dizziness does not go away.  Your dizziness or light-headedness gets worse.  You feel nauseous.  You have reduced hearing.  You have new symptoms.  You are unsteady on your feet or you feel like the room is spinning. Get help right away if:  You vomit or have diarrhea and are unable to eat or drink anything.  You have problems talking, walking, swallowing, or using your arms, hands, or legs.  You feel generally weak.  You are not thinking clearly or you have trouble forming sentences. It may take a friend or family member to notice this.  You have chest pain, abdominal pain, shortness of breath, or sweating.  Your vision changes.  You have any bleeding.  You have a severe headache.  You have neck pain or a stiff neck.  You have a fever. These symptoms may represent a serious problem that is an emergency.  Do not wait to see if the symptoms will go away. Get medical help right away. Call your local emergency services (911 in the [place] Do not drive yourself to the hospital. Summary  Dizziness is a feeling of unsteadiness or light-headedness. This condition can be caused by a number of things, including medicines, dehydration, or illness.  Anyone can become dizzy, but dizziness is more common in older adults.  Drink enough fluid to keep your urine clear or pale yellow. Do not drink alcohol.  Avoid making  quick movements if you feel dizzy. Monitor your dizziness for any changes. This information is not intended to replace advice given to you by your health care provider. Make sure you discuss any questions you have with your health care provider. Document Revised: 01/16/2017 Document Reviewed: 02/16/2016 Elsevier Patient Education  2020 [employer].

## 2019-10-14 LAB — CBC WITH DIFFERENTIAL/PLATELET
Basophils Absolute: 0 10*3/uL (ref 0.0–0.2)
Basos: 1 %
EOS (ABSOLUTE): 0.5 10*3/uL — ABNORMAL HIGH (ref 0.0–0.4)
Eos: 11 %
Hematocrit: 47.9 % (ref 37.5–51.0)
Hemoglobin: 15.8 g/dL (ref 13.0–17.7)
Immature Grans (Abs): 0 10*3/uL (ref 0.0–0.1)
Immature Granulocytes: 0 %
Lymphocytes Absolute: 1.9 10*3/uL (ref 0.7–3.1)
Lymphs: 44 %
MCH: 30 pg (ref 26.6–33.0)
MCHC: 33 g/dL (ref 31.5–35.7)
MCV: 91 fL (ref 79–97)
Monocytes Absolute: 0.3 10*3/uL (ref 0.1–0.9)
Monocytes: 8 %
Neutrophils Absolute: 1.6 10*3/uL (ref 1.4–7.0)
Neutrophils: 36 %
Platelets: 156 10*3/uL (ref 150–450)
RBC: 5.26 x10E6/uL (ref 4.14–5.80)
RDW: 12.5 % (ref 11.6–15.4)
WBC: 4.3 10*3/uL (ref 3.4–10.8)

## 2019-10-14 LAB — CMP14+EGFR
ALT: 20 IU/L (ref 0–44)
AST: 19 IU/L (ref 0–40)
Albumin/Globulin Ratio: 1.6 (ref 1.2–2.2)
Albumin: 4.5 g/dL (ref 3.8–4.9)
Alkaline Phosphatase: 71 IU/L (ref 44–121)
BUN/Creatinine Ratio: 7 — ABNORMAL LOW (ref 9–20)
BUN: 9 mg/dL (ref 6–24)
Bilirubin Total: 0.3 mg/dL (ref 0.0–1.2)
CO2: 23 mmol/L (ref 20–29)
Calcium: 9.4 mg/dL (ref 8.7–10.2)
Chloride: 101 mmol/L (ref 96–106)
Creatinine, Ser: 1.21 mg/dL (ref 0.76–1.27)
GFR calc Af Amer: 79 mL/min/{1.73_m2} (ref >59)
GFR calc non Af Amer: 68 mL/min/{1.73_m2} (ref >59)
Globulin, Total: 2.9 g/dL (ref 1.5–4.5)
Glucose: 94 mg/dL (ref 65–99)
Potassium: 4.8 mmol/L (ref 3.5–5.2)
Sodium: 139 mmol/L (ref 134–144)
Total Protein: 7.4 g/dL (ref 6.0–8.5)

## 2019-10-14 LAB — MAGNESIUM: Magnesium: 2.2 mg/dL (ref 1.6–2.3)

## 2019-10-14 LAB — T4 AND TSH
T4, Total: 6.4 ug/dL (ref 4.5–12.0)
TSH: 1.05 u[IU]/mL (ref 0.450–4.500)

## 2019-10-20 ENCOUNTER — Encounter: Payer: Self-pay | Admitting: Family Medicine

## 2019-10-21 ENCOUNTER — Ambulatory Visit (INDEPENDENT_AMBULATORY_CARE_PROVIDER_SITE_OTHER): Payer: 59 | Admitting: Internal Medicine

## 2019-10-21 ENCOUNTER — Encounter: Payer: Self-pay | Admitting: Internal Medicine

## 2019-10-21 ENCOUNTER — Other Ambulatory Visit: Payer: Self-pay

## 2019-10-21 VITALS — BP 123/82 | HR 73 | Resp 16 | Ht 71.0 in | Wt 220.0 lb

## 2019-10-21 DIAGNOSIS — R109 Unspecified abdominal pain: Secondary | ICD-10-CM | POA: Diagnosis not present

## 2019-10-21 MED ORDER — DICYCLOMINE HCL 10 MG PO CAPS: 10.0000 mg | ORAL_CAPSULE | Freq: Three times a day (TID) | ORAL | 1 refills | Status: DC | PRN

## 2019-10-21 NOTE — Progress Notes (Signed)
Established Patient Office Visit  Subjective:  Patient ID: Evan Jacobson, male    DOB: 05/30/1965  Age: 54 y.o. MRN: [id number]  CC:  Chief Complaint  Patient presents with  . Abdominal Pain    tuesday he had very bad stomach pains that lasted about 6 hours and then afterwards his stomach muscles were really sore and still bloated     HPI Evan Jacobson is a 54 year old male with past medical history of hypertension, GERD and PTSD presents for evaluation of abdominal soreness.  He states that he had abdominal pain and distention 2 days ago, which started at nighttime.  He states that it was all over his abdomen, felt that it was dull, and was radiating to his back.  He mentions that he had donuts and sandwich that morning (outside meal).  He denies any fever, chills, nausea, vomiting, constipation or diarrhea.  Patient denies any relationship of the pain with bowel movements.  He tried taking Gas-X and Pepto-Bismol to relieve the pain, which relieved the pain till the morning.  Since then he had soreness of abdomen for 2 days.  He says that he feels mildly relieved since this morning.  Patient had left lower quadrant abdominal pain last week, which lasted for 2 days, but did not have any fever, chills or melena.  Of note, upon asking for any history of abdominal surgery, the patient mentions that he had abdominal trauma while playing football when he was in high school and was told that he had ruptured spleen and was in the hospital for about a week, but no surgery was performed at that time.  Past Medical History:  Diagnosis Date  . Allergic rhinitis   . Bipolar disorder ([place]) 08/25/2011  . Chronic back pain   . Concussion 2012  . GERD (gastroesophageal reflux disease)   . Groin pain, chronic, right 06/28/2014  . Heart palpitations 06/18/2016  . Hypertension   . Persistent adjustment disorder 12/05/2015   Chronic adjustment disorder diagnosed by [facility] and counseling recomended  . PTSD  (post-traumatic stress disorder)   . Sinusitis 08/13/2007   Qualifier: Diagnosis of  By: Lenn Cal    . Skin lump of leg, left 11/17/2017  . Vertigo 01/08/2017    Past Surgical History:  Procedure Laterality Date  . COLONOSCOPY N/A 08/12/2017   Procedure: COLONOSCOPY;  Surgeon: Rogene Houston, MD;  Location: [facility];  Service: Endoscopy;  Laterality: N/A;  730  . NO PAST SURGERIES      Family History  Problem Relation Age of Onset  . Stroke Father   . Hypertension Father   . Hypertension Mother        [name]  . Heart disease Mother        Angina  . Hypertension Sister   . Hypertension Sister   . Hypertension Brother   . Hypertension Brother   . Colon cancer Neg Hx     Social History   Socioeconomic History  . Marital status: Married    Spouse name: Not on file  . Number of children: 2  . Years of education: Not on file  . Highest education level: Not on file  Occupational History    Comment: Audio video tech    Employer: [employer]  Tobacco Use  . Smoking status: Never Smoker  . Smokeless tobacco: Never Used  Vaping Use  . Vaping Use: Never used  Substance and Sexual Activity  . Alcohol use: Yes    Comment:  occ; six pack per week  . Drug use: No  . Sexual activity: Yes    Birth control/protection: None  Other Topics Concern  . Not on file  Social History Narrative  . Not on file   Social Determinants of Health   Financial Resource Strain:   . Difficulty of Paying Living Expenses: Not on file  Food Insecurity:   . Worried About Charity fundraiser in the Last Year: Not on file  . Ran Out of Food in the Last Year: Not on file  Transportation Needs:   . Lack of Transportation (Medical): Not on file  . Lack of Transportation (Non-Medical): Not on file  Physical Activity:   . Days of Exercise per Week: Not on file  . Minutes of Exercise per Session: Not on file  Stress:   . Feeling of Stress : Not on file  Social Connections:   . Frequency of  Communication with Friends and Family: Not on file  . Frequency of Social Gatherings with Friends and Family: Not on file  . Attends Religious Services: Not on file  . Active Member of Clubs or Organizations: Not on file  . Attends Archivist Meetings: Not on file  . Marital Status: Not on file  Intimate Partner Violence:   . Fear of Current or Ex-Partner: Not on file  . Emotionally Abused: Not on file  . Physically Abused: Not on file  . Sexually Abused: Not on file    Outpatient Medications Prior to Visit  Medication Sig Dispense Refill  . amLODipine (NORVASC) 2.5 MG tablet TAKE 3 TABLETS (7.5 MG TOTAL) BY MOUTH DAILY. 270 tablet 1  . cetirizine (ZYRTEC) 10 MG tablet Take 10 mg by mouth daily.    . cyclobenzaprine (FLEXERIL) 10 MG tablet Take 1 tablet (10 mg total) by mouth 2 (two) times daily as needed for muscle spasms. 20 tablet 0  . EPINEPHrine 0.3 mg/0.3 mL IJ SOAJ injection Inject 0.3 mg into the muscle once.     Marland Kitchen ibuprofen (ADVIL) 600 MG tablet Take 1 tablet (600 mg total) by mouth every 6 (six) hours as needed. 30 tablet 0  . Krill Oil 500 MG CAPS Take 500 mg by mouth daily.     . Multiple Vitamin (MULTIVITAMIN WITH MINERALS) TABS tablet Take 2 tablets by mouth daily.     . sildenafil (VIAGRA) 100 MG tablet Take 1 tablet (100 mg total) by mouth as needed for erectile dysfunction. 6 tablet 11   No facility-administered medications prior to visit.    Allergies  Allergen Reactions  . Sulfa Antibiotics Anaphylaxis  . Ace Inhibitors Other (See Comments)    Feels as though throat is closing up intermittently  . Sulfasalazine Other (See Comments)    Tongue swelled   . Sulfonamide Derivatives Other (See Comments)    Tongue swelled    ROS Review of Systems  Constitutional: Negative for chills and fever.  HENT: Negative for congestion and sore throat.   Eyes: Negative for pain and discharge.  Respiratory: Negative for cough and shortness of breath.    Cardiovascular: Negative for chest pain and palpitations.  Gastrointestinal: Positive for abdominal pain (Mild). Negative for constipation, diarrhea, nausea and vomiting.  Endocrine: Negative for polydipsia and polyuria.  Genitourinary: Negative for dysuria and hematuria.  Musculoskeletal: Positive for back pain (Chronic). Negative for neck pain and neck stiffness.  Skin: Negative for rash.  Neurological: Negative for dizziness, weakness, numbness and headaches.  Psychiatric/Behavioral: Negative for agitation and  behavioral problems.      Objective:    Physical Exam Vitals reviewed.  Constitutional:      General: He is not in acute distress.    Appearance: He is not diaphoretic.  HENT:     Head: Normocephalic and atraumatic.     Nose: Nose normal.     Mouth/Throat:     Mouth: Mucous membranes are moist.  Eyes:     General: No scleral icterus.    Extraocular Movements: Extraocular movements intact.     Pupils: Pupils are equal, round, and reactive to light.  Cardiovascular:     Rate and Rhythm: Normal rate and regular rhythm.     Heart sounds: No murmur heard.   Pulmonary:     Breath sounds: Normal breath sounds. No wheezing or rales.  Abdominal:     General: Bowel sounds are normal.     Palpations: Abdomen is soft.     Tenderness: There is no abdominal tenderness. There is no guarding or rebound.  Musculoskeletal:     Cervical back: Neck supple. No tenderness.     Right lower leg: No edema.     Left lower leg: No edema.  Skin:    General: Skin is warm.     Findings: No rash.  Neurological:     General: No focal deficit present.     Mental Status: He is alert and oriented to person, place, and time.     Cranial Nerves: No cranial nerve deficit.     Sensory: No sensory deficit.  Psychiatric:        Mood and Affect: Mood normal.        Behavior: Behavior normal.     BP 123/82   Pulse 73   Resp 16   Ht 5\' 11"  (1.803 m)   Wt 220 lb (99.8 kg)   SpO2 97%   BMI  30.68 kg/m  Wt Readings from Last 3 Encounters:  10/21/19 220 lb (99.8 kg)  10/13/19 215 lb (97.5 kg)  08/17/19 221 lb 0.6 oz (100.3 kg)     Health Maintenance Due  Topic Date Due  . Hepatitis C Screening  Never done    There are no preventive care reminders to display for this patient.  Lab Results  Component Value Date   TSH 1.050 10/13/2019   Lab Results  Component Value Date   WBC 4.3 10/13/2019   HGB 15.8 10/13/2019   HCT 47.9 10/13/2019   MCV 91 10/13/2019   PLT 156 10/13/2019   Lab Results  Component Value Date   NA 139 10/13/2019   K 4.8 10/13/2019   CO2 23 10/13/2019   GLUCOSE 94 10/13/2019   BUN 9 10/13/2019   CREATININE 1.21 10/13/2019   BILITOT 0.3 10/13/2019   ALKPHOS 71 10/13/2019   AST 19 10/13/2019   ALT 20 10/13/2019   PROT 7.4 10/13/2019   ALBUMIN 4.5 10/13/2019   CALCIUM 9.4 10/13/2019   ANIONGAP 8 04/12/2019   Lab Results  Component Value Date   CHOL 170 06/03/2019   Lab Results  Component Value Date   HDL 73 06/03/2019   Lab Results  Component Value Date   LDLCALC 84 06/03/2019   Lab Results  Component Value Date   TRIG 45 06/03/2019   Lab Results  Component Value Date   CHOLHDL 2.3 06/03/2019   Lab Results  Component Value Date   HGBA1C 5.0 06/03/2019      Assessment & Plan:   Abdominal pain/spasms Persistent  soreness for 3 days Mild relief from antacids and Gas-X Will give Bentyl PRN for spasms Obtain CT abdomen/pelvis to r/o partial SBO or acute inflammmation Advised to stay hydrated Explained warning signs when to get immediate medical attention    Follow-up: Return if symptoms worsen or fail to improve.    Lindell Spar, MD

## 2019-10-21 NOTE — Patient Instructions (Addendum)
You are instructed to get CT abdomen.  Please take Bentyl as needed for abdominal spasms. Please get medical attention if severe abdominal pain or blood in stool.  Abdominal Pain, Adult Pain in the abdomen (abdominal pain) can be caused by many things. Often, abdominal pain is not serious and it gets better with no treatment or by being treated at home. However, sometimes abdominal pain is serious. Your health care provider will ask questions about your medical history and do a physical exam to try to determine the cause of your abdominal pain. Follow these instructions at home:  Medicines  Take over-the-counter and prescription medicines only as told by your health care provider.  Do not take a laxative unless told by your health care provider. General instructions  Watch your condition for any changes.  Drink enough fluid to keep your urine pale yellow.  Keep all follow-up visits as told by your health care provider. This is important. Contact a health care provider if:  Your abdominal pain changes or gets worse.  You are not hungry or you lose weight without trying.  You are constipated or have diarrhea for more than 2-3 days.  You have pain when you urinate or have a bowel movement.  Your abdominal pain wakes you up at night.  Your pain gets worse with meals, after eating, or with certain foods.  You are vomiting and cannot keep anything down.  You have a fever.  You have blood in your urine. Get help right away if:  Your pain does not go away as soon as your health care provider told you to expect.  You cannot stop vomiting.  Your pain is only in areas of the abdomen, such as the right side or the left lower portion of the abdomen. Pain on the right side could be caused by appendicitis.  You have bloody or black stools, or stools that look like tar.  You have severe pain, cramping, or bloating in your abdomen.  You have signs of dehydration, such as: ? Dark  urine, very little urine, or no urine. ? Cracked lips. ? Dry mouth. ? Sunken eyes. ? Sleepiness. ? Weakness.  You have trouble breathing or chest pain. Summary  Often, abdominal pain is not serious and it gets better with no treatment or by being treated at home. However, sometimes abdominal pain is serious.  Watch your condition for any changes.  Take over-the-counter and prescription medicines only as told by your health care provider.  Contact a health care provider if your abdominal pain changes or gets worse.  Get help right away if you have severe pain, cramping, or bloating in your abdomen. This information is not intended to replace advice given to you by your health care provider. Make sure you discuss any questions you have with your health care provider. Document Revised: 05/24/2018 Document Reviewed: 05/24/2018 Elsevier Patient Education  [employer].

## 2019-11-21 ENCOUNTER — Ambulatory Visit: Payer: 59 | Admitting: Cardiology

## 2019-11-21 ENCOUNTER — Encounter: Payer: Self-pay | Admitting: Family Medicine

## 2019-11-21 ENCOUNTER — Other Ambulatory Visit: Payer: Self-pay

## 2019-11-21 ENCOUNTER — Encounter: Payer: Self-pay | Admitting: Cardiology

## 2019-11-22 NOTE — Telephone Encounter (Signed)
Pt wanted to make you aware of experience he had

## 2019-11-24 ENCOUNTER — Ambulatory Visit (HOSPITAL_COMMUNITY)
Admission: RE | Admit: 2019-11-24 | Discharge: 2019-11-24 | Disposition: A | Discharge status: Home / Self Care | Payer: 59 | Source: Ambulatory Visit | Attending: Internal Medicine | Admitting: Internal Medicine

## 2019-11-24 ENCOUNTER — Other Ambulatory Visit: Payer: Self-pay

## 2019-11-24 ENCOUNTER — Ambulatory Visit (INDEPENDENT_AMBULATORY_CARE_PROVIDER_SITE_OTHER): Payer: 59 | Admitting: Cardiology

## 2019-11-24 ENCOUNTER — Encounter: Payer: Self-pay | Admitting: Cardiology

## 2019-11-24 VITALS — BP 122/84 | HR 82 | Ht 71.0 in | Wt 223.4 lb

## 2019-11-24 DIAGNOSIS — Z87898 Personal history of other specified conditions: Secondary | ICD-10-CM | POA: Diagnosis not present

## 2019-11-24 DIAGNOSIS — I10 Essential (primary) hypertension: Secondary | ICD-10-CM | POA: Diagnosis not present

## 2019-11-24 DIAGNOSIS — R002 Palpitations: Secondary | ICD-10-CM | POA: Diagnosis not present

## 2019-11-24 DIAGNOSIS — R109 Unspecified abdominal pain: Secondary | ICD-10-CM | POA: Diagnosis not present

## 2019-11-24 LAB — POCT I-STAT CREATININE: Creatinine, Ser: 1.3 mg/dL — ABNORMAL HIGH (ref 0.61–1.24)

## 2019-11-24 MED ORDER — IOHEXOL 300 MG/ML  SOLN
100.0000 mL | Freq: Once | INTRAMUSCULAR | Status: AC | PRN
  Administered 2019-11-24: 100 mL via INTRAVENOUS

## 2019-11-24 MED ORDER — IOHEXOL 9 MG/ML PO SOLN
ORAL | Status: AC
  Filled 2019-11-24: qty 500

## 2019-11-24 MED ORDER — BARIUM SULFATE 2.1 % PO SUSP
ORAL | Status: AC
  Filled 2019-11-24: qty 2

## 2019-11-24 MED ORDER — PROPRANOLOL HCL 10 MG PO TABS: ORAL_TABLET | ORAL | 1 refills | Status: DC

## 2019-11-24 NOTE — Progress Notes (Signed)
Cardiology Office Note  Date: 11/24/2019   ID: ANGELES Jacobson, DOB 25-Oct-1965, MRN [id number]  PCP:  Fayrene Helper, MD  Cardiologist:  Rozann Lesches, MD Electrophysiologist:  None   Chief Complaint  Patient presents with  . Cardiac follow-up    History of Present Illness: Evan Jacobson is a 54 y.o. male last assessed via telehealth encounter in February.  He presents today for a follow-up visit.  I referred him for a coronary CTA back in March of this year for evaluation of recurring chest pain.  Fortunately, this test was quite reassuring showing a calcium score of 0 and no evidence of obstructive CAD.  I reviewed these results with him again today and reassured him.  From a symptom perspective he continues to report intermittent sense of increased heart rate and chest tightness.  Sometimes it seems to be related to caffeine intake, but not exclusively.  This happens usually about once per month.  He has never had any associated syncope.  I personally reviewed his ECG today which shows sinus rhythm with nonspecific ST-T changes.  Past Medical History:  Diagnosis Date  . Allergic rhinitis   . Bipolar disorder ([place]) 08/25/2011  . Chronic back pain   . Concussion 2012  . GERD (gastroesophageal reflux disease)   . Groin pain, chronic, right 06/28/2014  . Heart palpitations 06/18/2016  . Hypertension   . Persistent adjustment disorder 12/05/2015   Chronic adjustment disorder diagnosed by [facility] and counseling recomended  . PTSD (post-traumatic stress disorder)   . Sinusitis   . Skin lump of leg, left 11/17/2017  . Vertigo 01/08/2017    Past Surgical History:  Procedure Laterality Date  . COLONOSCOPY N/A 08/12/2017   Procedure: COLONOSCOPY;  Surgeon: Rogene Houston, MD;  [facility];  Service: Endoscopy;  Laterality: N/A;  730  . NO PAST SURGERIES      Current Outpatient Medications  Medication Sig Dispense Refill  . amLODipine (NORVASC) 2.5 MG  tablet TAKE 3 TABLETS (7.5 MG TOTAL) BY MOUTH DAILY. 270 tablet 1  . cetirizine (ZYRTEC) 10 MG tablet Take 10 mg by mouth daily.    . cyclobenzaprine (FLEXERIL) 10 MG tablet Take 1 tablet (10 mg total) by mouth 2 (two) times daily as needed for muscle spasms. 20 tablet 0  . EPINEPHrine 0.3 mg/0.3 mL IJ SOAJ injection Inject 0.3 mg into the muscle once.     Marland Kitchen ibuprofen (ADVIL) 600 MG tablet Take 1 tablet (600 mg total) by mouth every 6 (six) hours as needed. 30 tablet 0  . Krill Oil 500 MG CAPS Take 500 mg by mouth daily.     . Multiple Vitamin (MULTIVITAMIN WITH MINERALS) TABS tablet Take 2 tablets by mouth daily.     . sildenafil (VIAGRA) 100 MG tablet Take 1 tablet (100 mg total) by mouth as needed for erectile dysfunction. 6 tablet 11  . propranolol (INDERAL) 10 MG tablet Take 10 mg every 8 hours as needed for palpitaions 90 tablet 1   No current facility-administered medications for this visit.   Facility-Administered Medications Ordered in Other Visits  Medication Dose Route Frequency Provider Last Rate Last Admin  . iohexol (OMNIPAQUE) 9 MG/ML oral solution           . iohexol (OMNIPAQUE) 9 MG/ML oral solution            Allergies:  Sulfa antibiotics, Ace inhibitors, Sulfasalazine, and Sulfonamide derivatives   ROS: No syncope.  Physical Exam: VS:  BP 122/84   Pulse 82   Ht 5\' 11"  (1.803 m)   Wt 223 lb 6.4 oz (101.3 kg)   SpO2 98%   BMI 31.16 kg/m , BMI Body mass index is 31.16 kg/m.  Wt Readings from Last 3 Encounters:  11/24/19 223 lb 6.4 oz (101.3 kg)  11/21/19 223 lb 12.8 oz (101.5 kg)  10/21/19 220 lb (99.8 kg)    General: Patient appears comfortable at rest. HEENT: Conjunctiva and lids normal, wearing a mask. Neck: Supple, no elevated JVP or carotid bruits, no thyromegaly. Lungs: Clear to auscultation, nonlabored breathing at rest. Cardiac: Regular rate and rhythm, no S3 or significant systolic murmur, no pericardial rub. Extremities: No pitting edema, distal  pulses 2+.  ECG:  An ECG dated 10/13/2019 was personally reviewed today and demonstrated:  Sinus arrhythmia with nonspecific T wave changes.  Recent Labwork: 10/13/2019: ALT 20; AST 19; BUN 9; Creatinine, Ser 1.21; Hemoglobin 15.8; Magnesium 2.2; Platelets 156; Potassium 4.8; Sodium 139; TSH 1.050     Component Value Date/Time   CHOL 170 06/03/2019 0900   TRIG 45 06/03/2019 0900   HDL 73 06/03/2019 0900   CHOLHDL 2.3 06/03/2019 0900   VLDL 14 06/18/2016 0936   LDLCALC 84 06/03/2019 0900    Other Studies Reviewed Today:  Exercise echocardiogram 07/01/2016: Study Conclusions  - Stress ECG conclusions: The stress ECG was negative for ischemia. Duke scoring: exercise time of 10 min; maximum ST deviation of 0 mm; no angina; resulting score is 10. This score predicts a low risk of cardiac events. - Staged echo: There was no diagnostic evidence for stress-induced ischemia.  30-day event recorder 02/16/2017: Available strips from 30-day event recorder reviewed. Sinus rhythm, sinus arrhythmia, and sinus tachycardia noted without clear correlation of specific heart rate to symptoms. No obvious sustained arrhythmias or pauses.  Carotid Dopplers 02/04/2017: IMPRESSION: 1. No significant carotid plaque or stenosis. 2. Antegrade bilateral vertebral arterial flow.  Coronary CTA 04/14/2019: FINDINGS: A 120 kV prospective scan was triggered in the descending thoracic aorta at 111 HU's. Axial non-contrast 3 mm slices were carried out through the heart. The data set was analyzed on a dedicated work station and scored using the Wink. Gantry rotation speed was 250 msecs and collimation was .6 mm. No beta blockade and 0.8 mg of sl NTG was given. The 3D data set was reconstructed in 5% intervals of the 67-82 % of the R-R cycle. Diastolic phases were analyzed on a dedicated work station using MPR, MIP and VRT modes. The patient received 80 cc of contrast.  Aorta: Normal size.  Ascending aorta 3.0 cm. No calcifications. No dissection.  Aortic Valve:  Trileaflet.  No calcifications.  Coronary Arteries:  Normal coronary origin.  Right dominance.  RCA is a large dominant artery that gives rise to PDA and PLVB. There is mild plaque in the distal RCA just prior to the PDA.  Left main is a large artery that gives rise to LAD and LCX arteries.  LAD is a large vessel that has no plaque. There is a small D1 and large D2 without plaque.  LCX is a non-dominant artery that gives rise to a large OM1, large OM2 and small OM3. There is no plaque.  Other findings:  Normal pulmonary vein drainage into the left atrium.  Normal let atrial appendage without a thrombus.  Normal size of the pulmonary artery.  IMPRESSION: 1. Coronary calcium score of 0. This was 0 percentile for age and sex matched control.  2. Normal coronary origin with right dominance.  3. No evidence of obstructive CAD.  Assessment and Plan:  1.  Intermittent sense of elevated heart rate and chest tightness.  Possible relation to caffeine intake but not exclusively.  He has had no associated syncope.  ECG reviewed.  Cardiac work-up to date has been reassuring including a normal coronary CTA in March with calcium score of 0 and no significant CAD.  He did wear a cardiac monitor back in 2019 that showed no significant arrhythmias.  Today we discussed options and will initiate as needed use of propranolol 10 mg for palpitations.  We will bring him back for office follow-up.  2.  Essential hypertension, currently on low-dose amlodipine with follow-up by Dr. Moshe Cipro.  Blood pressure is well controlled today.  Medication Adjustments/Labs and Tests Ordered: Current medicines are reviewed at length with the patient today.  Concerns regarding medicines are outlined above.   Tests Ordered: Orders Placed This Encounter  Procedures  . EKG 12-Lead    Medication Changes: Meds ordered this  encounter  Medications  . propranolol (INDERAL) 10 MG tablet    Sig: Take 10 mg every 8 hours as needed for palpitaions    Dispense:  90 tablet    Refill:  1    Disposition:  Follow up 3 months.  Signed, Satira Sark, MD, [facility] 11/24/2019 3:19 PM    [place] [facility] [address], [place], [place] [address] Phone: [phone]; Fax: [phone]

## 2019-11-24 NOTE — Patient Instructions (Signed)
Medication Instructions:  Take Propranolol 10 mg every 8 hours as needed for palpitations  *If you need a refill on your cardiac medications before your next appointment, please call your pharmacy*   Lab Work: None today If you have labs (blood work) drawn today and your tests are completely normal, you will receive your results only by: Marland Kitchen MyChart Message (if you have MyChart) OR . A paper copy in the mail If you have any lab test that is abnormal or we need to change your treatment, we will call you to review the results.   Testing/Procedures: None today   Follow-Up: At [facility], you and your health needs are our priority.  As part of our continuing mission to provide you with exceptional heart care, we have created designated Provider Care Teams.  These Care Teams include your primary Cardiologist (physician) and Advanced Practice Providers (APPs -  Physician Assistants and Nurse Practitioners) who all work together to provide you with the care you need, when you need it.  We recommend signing up for the patient portal called "MyChart".  Sign up information is provided on this After Visit Summary.  MyChart is used to connect with patients for Virtual Visits (Telemedicine).  Patients are able to view lab/test results, encounter notes, upcoming appointments, etc.  Non-urgent messages can be sent to your provider as well.   To learn more about what you can do with MyChart, go to [email or web address].    Your next appointment:   3 month(s)  The format for your next appointment:   In Person  Provider:   Rozann Lesches, MD   Other Instructions None     Thank you for choosing [facility] !

## 2019-11-28 ENCOUNTER — Encounter: Payer: Self-pay | Admitting: Internal Medicine

## 2019-11-28 ENCOUNTER — Other Ambulatory Visit: Payer: Self-pay

## 2019-11-28 ENCOUNTER — Ambulatory Visit (INDEPENDENT_AMBULATORY_CARE_PROVIDER_SITE_OTHER): Payer: 59 | Admitting: Internal Medicine

## 2019-11-28 VITALS — BP 132/89 | HR 71 | Temp 97.3°F | Resp 18 | Ht 71.0 in | Wt 222.8 lb

## 2019-11-28 DIAGNOSIS — R002 Palpitations: Secondary | ICD-10-CM | POA: Diagnosis not present

## 2019-11-28 DIAGNOSIS — K5900 Constipation, unspecified: Secondary | ICD-10-CM | POA: Diagnosis not present

## 2019-11-28 DIAGNOSIS — I10 Essential (primary) hypertension: Secondary | ICD-10-CM

## 2019-11-28 DIAGNOSIS — F528 Other sexual dysfunction not due to a substance or known physiological condition: Secondary | ICD-10-CM | POA: Diagnosis not present

## 2019-11-28 DIAGNOSIS — E669 Obesity, unspecified: Secondary | ICD-10-CM

## 2019-11-28 NOTE — Progress Notes (Signed)
Established Patient Office Visit  Subjective:  Patient ID: Evan Jacobson, male    DOB: [birth year]  Age: 54 y.o. MRN: [id number]  CC:  Chief Complaint  Patient presents with  . Follow-up    6 month follow up    HPI Evan Jacobson is a 54 year old male with past medical history of hypertension, GERD, erectile dysfunction and PTSD presents for follow-up of his chronic conditions.  Patient tolerates amlodipine well and takes it regularly.  His BP in the office today is 132/89.  Patient denies headache, dizziness, chest pain or dyspnea.  Patient complains of intermittent palpitations.  He had a visit with cardiologist, and was advised to start taking propranolol as needed for palpitations.  CT abdomen was discussed with the patient in detail.  Patient was advised to take docusate as needed for constipation.  Past Medical History:  Diagnosis Date  . Allergic rhinitis   . Bipolar disorder ([place]) 08/25/2011  . Chronic back pain   . Concussion 2012  . GERD (gastroesophageal reflux disease)   . Groin pain, chronic, right 06/28/2014  . Heart palpitations 06/18/2016  . Hypertension   . Persistent adjustment disorder 12/05/2015   Chronic adjustment disorder diagnosed by [facility] and counseling recomended  . PTSD (post-traumatic stress disorder)   . Sinusitis   . Skin lump of leg, left 11/17/2017  . Vertigo 01/08/2017    Past Surgical History:  Procedure Laterality Date  . COLONOSCOPY N/A 08/12/2017   Procedure: COLONOSCOPY;  Surgeon: Rogene Houston, MD;  [facility];  Service: Endoscopy;  Laterality: N/A;  730  . NO PAST SURGERIES      Family History  Problem Relation Age of Onset  . Stroke Father   . Hypertension Father   . Hypertension Mother        [name]  . Heart disease Mother        Angina  . Hypertension Sister   . Hypertension Sister   . Hypertension Brother   . Hypertension Brother   . Colon cancer Neg Hx     Social History   Socioeconomic History  .  Marital status: Married    Spouse name: Not on file  . Number of children: 2  . Years of education: Not on file  . Highest education level: Not on file  Occupational History    Comment: Audio video tech    Employer: [employer]  Tobacco Use  . Smoking status: Never Smoker  . Smokeless tobacco: Never Used  Vaping Use  . Vaping Use: Never used  Substance and Sexual Activity  . Alcohol use: Yes    Comment: occ; six pack per week  . Drug use: No  . Sexual activity: Yes    Birth control/protection: None  Other Topics Concern  . Not on file  Social History Narrative  . Not on file   Social Determinants of Health   Financial Resource Strain:   . Difficulty of Paying Living Expenses: Not on file  Food Insecurity:   . Worried About Charity fundraiser in the Last Year: Not on file  . Ran Out of Food in the Last Year: Not on file  Transportation Needs:   . Lack of Transportation (Medical): Not on file  . Lack of Transportation (Non-Medical): Not on file  Physical Activity:   . Days of Exercise per Week: Not on file  . Minutes of Exercise per Session: Not on file  Stress:   . Feeling of Stress :  Not on file  Social Connections:   . Frequency of Communication with Friends and Family: Not on file  . Frequency of Social Gatherings with Friends and Family: Not on file  . Attends Religious Services: Not on file  . Active Member of Clubs or Organizations: Not on file  . Attends Archivist Meetings: Not on file  . Marital Status: Not on file  Intimate Partner Violence:   . Fear of Current or Ex-Partner: Not on file  . Emotionally Abused: Not on file  . Physically Abused: Not on file  . Sexually Abused: Not on file    Outpatient Medications Prior to Visit  Medication Sig Dispense Refill  . amLODipine (NORVASC) 2.5 MG tablet TAKE 3 TABLETS (7.5 MG TOTAL) BY MOUTH DAILY. 270 tablet 1  . cetirizine (ZYRTEC) 10 MG tablet Take 10 mg by mouth daily.    . cyclobenzaprine  (FLEXERIL) 10 MG tablet Take 1 tablet (10 mg total) by mouth 2 (two) times daily as needed for muscle spasms. 20 tablet 0  . EPINEPHrine 0.3 mg/0.3 mL IJ SOAJ injection Inject 0.3 mg into the muscle once.     [name] Oil 500 MG CAPS Take 500 mg by mouth daily.     . Multiple Vitamin (MULTIVITAMIN WITH MINERALS) TABS tablet Take 2 tablets by mouth daily.     . propranolol (INDERAL) 10 MG tablet Take 10 mg every 8 hours as needed for palpitaions 90 tablet 1  . sildenafil (VIAGRA) 100 MG tablet Take 1 tablet (100 mg total) by mouth as needed for erectile dysfunction. 6 tablet 11  . ibuprofen (ADVIL) 600 MG tablet Take 1 tablet (600 mg total) by mouth every 6 (six) hours as needed. 30 tablet 0   No facility-administered medications prior to visit.    Allergies  Allergen Reactions  . Sulfa Antibiotics Anaphylaxis  . Ace Inhibitors Other (See Comments)    Feels as though throat is closing up intermittently  . Sulfasalazine Other (See Comments)    Tongue swelled   . Sulfonamide Derivatives Other (See Comments)    Tongue swelled    ROS Review of Systems  Constitutional: Negative for chills and fever.  HENT: Negative for congestion and sore throat.   Eyes: Negative for pain and discharge.  Respiratory: Negative for cough and shortness of breath.   Cardiovascular: Positive for palpitations. Negative for chest pain.  Gastrointestinal: Positive for constipation. Negative for abdominal pain, diarrhea, nausea and vomiting.  Endocrine: Negative for polydipsia and polyuria.  Genitourinary: Negative for dysuria and hematuria.  Musculoskeletal: Positive for back pain (Chronic). Negative for neck pain and neck stiffness.  Skin: Negative for rash.  Neurological: Negative for dizziness, weakness, numbness and headaches.  Psychiatric/Behavioral: Negative for agitation and behavioral problems.      Objective:    Physical Exam Vitals reviewed.  Constitutional:      General: He is not in acute  distress.    Appearance: He is not diaphoretic.  HENT:     Head: Normocephalic and atraumatic.     Nose: Nose normal.     Mouth/Throat:     Mouth: Mucous membranes are moist.  Eyes:     General: No scleral icterus.    Extraocular Movements: Extraocular movements intact.     Pupils: Pupils are equal, round, and reactive to light.  Cardiovascular:     Rate and Rhythm: Normal rate and regular rhythm.     Heart sounds: No murmur heard.   Pulmonary:  Breath sounds: Normal breath sounds. No wheezing or rales.  Abdominal:     General: Bowel sounds are normal.     Palpations: Abdomen is soft.     Tenderness: There is no abdominal tenderness. There is no guarding or rebound.  Musculoskeletal:     Cervical back: Neck supple. No tenderness.     Right lower leg: No edema.     Left lower leg: No edema.  Skin:    General: Skin is warm.     Findings: No rash.  Neurological:     General: No focal deficit present.     Mental Status: He is alert and oriented to person, place, and time.     Sensory: No sensory deficit.     Motor: No weakness.  Psychiatric:        Mood and Affect: Mood normal.        Behavior: Behavior normal.     BP 132/89 (BP Location: Right Arm, Patient Position: Sitting, Cuff Size: Normal)   Pulse 71   Temp (!) 97.3 F (36.3 C) (Temporal)   Resp 18   Ht 5\' 11"  (1.803 m)   Wt 222 lb 12.8 oz (101.1 kg)   SpO2 97%   BMI 31.07 kg/m  Wt Readings from Last 3 Encounters:  11/28/19 222 lb 12.8 oz (101.1 kg)  11/24/19 223 lb 6.4 oz (101.3 kg)  11/21/19 223 lb 12.8 oz (101.5 kg)     Health Maintenance Due  Topic Date Due  . Hepatitis C Screening  Never done  . TETANUS/TDAP  04/14/2018    There are no preventive care reminders to display for this patient.  Lab Results  Component Value Date   TSH 1.050 10/13/2019   Lab Results  Component Value Date   WBC 4.3 10/13/2019   HGB 15.8 10/13/2019   HCT 47.9 10/13/2019   MCV 91 10/13/2019   PLT 156  10/13/2019   Lab Results  Component Value Date   NA 139 10/13/2019   K 4.8 10/13/2019   CO2 23 10/13/2019   GLUCOSE 94 10/13/2019   BUN 9 10/13/2019   CREATININE 1.30 (H) 11/24/2019   BILITOT 0.3 10/13/2019   ALKPHOS 71 10/13/2019   AST 19 10/13/2019   ALT 20 10/13/2019   PROT 7.4 10/13/2019   ALBUMIN 4.5 10/13/2019   CALCIUM 9.4 10/13/2019   ANIONGAP 8 04/12/2019   Lab Results  Component Value Date   CHOL 170 06/03/2019   Lab Results  Component Value Date   HDL 73 06/03/2019   Lab Results  Component Value Date   LDLCALC 84 06/03/2019   Lab Results  Component Value Date   TRIG 45 06/03/2019   Lab Results  Component Value Date   CHOLHDL 2.3 06/03/2019   Lab Results  Component Value Date   HGBA1C 5.0 06/03/2019      Assessment & Plan:   Problem List Items Addressed This Visit      Cardiovascular and Mediastinum   Essential hypertension - Primary    BP Readings from Last 1 Encounters:  11/28/19 132/89   Well-controlled with Amlodipine Counseled for compliance with the medications Advised DASH diet and moderate exercise/walking, at least 150 mins/week         Other   ERECTILE DYSFUNCTION    Sildenafil PRN      Palpitations    Likely sinus tachycardia at times F/u with Cardiologist - Propranolol PRN       Obesity (BMI 30.0-34.9)    Diet  modification and moderate exercise advised DASH diet -mainly for better control of hypertension      Constipation    CT abdomen reviewed with the patient Advised to take Docusate PRN        Patient is advised to take the flu vaccine.  Patient states that he will think about it.  No orders of the defined types were placed in this encounter.   Follow-up: Return in about 6 months (around 05/27/2020).    Lindell Spar, MD

## 2019-11-28 NOTE — Assessment & Plan Note (Signed)
CT abdomen reviewed with the patient Advised to take Docusate PRN

## 2019-11-28 NOTE — Patient Instructions (Signed)
Please continue to take amlodipine for hypertension.  Please continue to follow DASH diet and perform moderate exercise, at least 150 minutes/week.  Please take propranolol as needed for palpitations as directed by your cardiologist.  Please take stool softener -docusate 100 mg up to twice daily as needed for constipation.

## 2019-11-28 NOTE — Assessment & Plan Note (Addendum)
Diet modification and moderate exercise advised DASH diet -mainly for better control of hypertension

## 2019-11-28 NOTE — Assessment & Plan Note (Signed)
Sildenafil PRN 

## 2019-11-28 NOTE — Assessment & Plan Note (Signed)
BP Readings from Last 1 Encounters:  11/28/19 132/89   Well-controlled with Amlodipine Counseled for compliance with the medications Advised DASH diet and moderate exercise/walking, at least 150 mins/week

## 2019-11-28 NOTE — Assessment & Plan Note (Signed)
Likely sinus tachycardia at times F/u with Cardiologist - Propranolol PRN

## 2019-12-01 ENCOUNTER — Ambulatory Visit: Payer: 59 | Admitting: Family Medicine

## 2019-12-13 ENCOUNTER — Other Ambulatory Visit: Payer: Self-pay

## 2019-12-13 ENCOUNTER — Encounter: Payer: Self-pay | Admitting: Internal Medicine

## 2019-12-13 ENCOUNTER — Ambulatory Visit (INDEPENDENT_AMBULATORY_CARE_PROVIDER_SITE_OTHER): Payer: 59 | Admitting: Internal Medicine

## 2019-12-13 VITALS — BP 138/93 | HR 80 | Temp 98.3°F | Resp 18 | Ht 71.0 in | Wt 225.0 lb

## 2019-12-13 DIAGNOSIS — R42 Dizziness and giddiness: Secondary | ICD-10-CM

## 2019-12-13 DIAGNOSIS — F419 Anxiety disorder, unspecified: Secondary | ICD-10-CM | POA: Insufficient documentation

## 2019-12-13 DIAGNOSIS — I10 Essential (primary) hypertension: Secondary | ICD-10-CM

## 2019-12-13 DIAGNOSIS — R000 Tachycardia, unspecified: Secondary | ICD-10-CM | POA: Diagnosis not present

## 2019-12-13 NOTE — Assessment & Plan Note (Signed)
Patient reports resting tachycardia along with dizziness at times. HR in office around 80, but patient uses smartwatch that monitors HR at home, which shows HR as high as 140s Cardiology evaluation done, including Holter monitor - sinus tachycardia  Propranolol PRN

## 2019-12-13 NOTE — Patient Instructions (Signed)
Please take Inderal (Propranolol) before driving or any event that might cause anxiety.  Please stay hydrated by taking at least 2.5 l of fluid in a day. Please avoid skipping a meal.  Please schedule appointment with Ophthalmologist to get routine eye checkup.

## 2019-12-13 NOTE — Assessment & Plan Note (Signed)
Likely related to situational anxiety and/or dehydration Avoid skipping meal Increase fluid intake Inderal for palpitations

## 2019-12-13 NOTE — Progress Notes (Signed)
Established Patient Office Visit  Subjective:  Patient ID: Evan Jacobson, male    DOB: May 05, 1965  Age: 54 y.o. MRN: [id number]  CC:  Chief Complaint  Patient presents with  . Dizziness    pt has been getting dizzy when driving googled symptoms found binocular vision dysfunction and says he has all signs and symptoms and needs to be treated can focus better glasses with off but cant see without them     HPI Evan Jacobson a 54 year old male with past medical history of hypertension, GERD, erectile dysfunction and PTSDpresents for evaluation of dizziness while driving.  He had an episode of dizziness while driving, and had to pull over to help with it. He describes it as lightheadedness, and had tachycardia, HR in 130s. He denies any room spinning sensation. Denies taking any medication except Amlodipine on that day. He mentions similar episodes since then when he tries to drive. Denies any shaking movements. Of note, he has a h/o anxiety, for which he was taking a medication, which he did not tolerate. He has not taken Propranolol for tachycardia yet. Denies chest pain, dyspnea or headache.  Past Medical History:  Diagnosis Date  . Allergic rhinitis   . Bipolar disorder ([place]) 08/25/2011  . Chronic back pain   . Concussion 2012  . GERD (gastroesophageal reflux disease)   . Groin pain, chronic, right 06/28/2014  . Heart palpitations 06/18/2016  . Hypertension   . Persistent adjustment disorder 12/05/2015   Chronic adjustment disorder diagnosed by [facility] and counseling recomended  . PTSD (post-traumatic stress disorder)   . Sinusitis   . Skin lump of leg, left 11/17/2017  . Vertigo 01/08/2017    Past Surgical History:  Procedure Laterality Date  . COLONOSCOPY N/A 08/12/2017   Procedure: COLONOSCOPY;  Surgeon: Rogene Houston, MD;  [facility];  Service: Endoscopy;  Laterality: N/A;  730  . NO PAST SURGERIES      Family History  Problem Relation Age of Onset  .  Stroke Father   . Hypertension Father   . Hypertension Mother        [name]  . Heart disease Mother        Angina  . Hypertension Sister   . Hypertension Sister   . Hypertension Brother   . Hypertension Brother   . Colon cancer Neg Hx     Social History   Socioeconomic History  . Marital status: Married    Spouse name: Not on file  . Number of children: 2  . Years of education: Not on file  . Highest education level: Not on file  Occupational History    Comment: Audio video tech    Employer: [employer]  Tobacco Use  . Smoking status: Never Smoker  . Smokeless tobacco: Never Used  Vaping Use  . Vaping Use: Never used  Substance and Sexual Activity  . Alcohol use: Yes    Comment: occ; six pack per week  . Drug use: No  . Sexual activity: Yes    Birth control/protection: None  Other Topics Concern  . Not on file  Social History Narrative  . Not on file   Social Determinants of Health   Financial Resource Strain:   . Difficulty of Paying Living Expenses: Not on file  Food Insecurity:   . Worried About Charity fundraiser in the Last Year: Not on file  . Ran Out of Food in the Last Year: Not on file  Transportation Needs:   .  Lack of Transportation (Medical): Not on file  . Lack of Transportation (Non-Medical): Not on file  Physical Activity:   . Days of Exercise per Week: Not on file  . Minutes of Exercise per Session: Not on file  Stress:   . Feeling of Stress : Not on file  Social Connections:   . Frequency of Communication with Friends and Family: Not on file  . Frequency of Social Gatherings with Friends and Family: Not on file  . Attends Religious Services: Not on file  . Active Member of Clubs or Organizations: Not on file  . Attends Archivist Meetings: Not on file  . Marital Status: Not on file  Intimate Partner Violence:   . Fear of Current or Ex-Partner: Not on file  . Emotionally Abused: Not on file  . Physically Abused: Not on file  .  Sexually Abused: Not on file    Outpatient Medications Prior to Visit  Medication Sig Dispense Refill  . amLODipine (NORVASC) 2.5 MG tablet TAKE 3 TABLETS (7.5 MG TOTAL) BY MOUTH DAILY. 270 tablet 1  . cetirizine (ZYRTEC) 10 MG tablet Take 10 mg by mouth daily.    . cyclobenzaprine (FLEXERIL) 10 MG tablet Take 1 tablet (10 mg total) by mouth 2 (two) times daily as needed for muscle spasms. 20 tablet 0  . EPINEPHrine 0.3 mg/0.3 mL IJ SOAJ injection Inject 0.3 mg into the muscle once.     [name] Oil 500 MG CAPS Take 500 mg by mouth daily.     . Multiple Vitamin (MULTIVITAMIN WITH MINERALS) TABS tablet Take 2 tablets by mouth daily.     . propranolol (INDERAL) 10 MG tablet Take 10 mg every 8 hours as needed for palpitaions 90 tablet 1  . sildenafil (VIAGRA) 100 MG tablet Take 1 tablet (100 mg total) by mouth as needed for erectile dysfunction. 6 tablet 11   No facility-administered medications prior to visit.    Allergies  Allergen Reactions  . Sulfa Antibiotics Anaphylaxis  . Ace Inhibitors Other (See Comments)    Feels as though throat is closing up intermittently  . Sulfasalazine Other (See Comments)    Tongue swelled   . Sulfonamide Derivatives Other (See Comments)    Tongue swelled    ROS Review of Systems  Constitutional: Negative for chills and fever.  HENT: Negative for congestion and sore throat.   Eyes: Negative for pain and discharge.  Respiratory: Negative for cough and shortness of breath.   Cardiovascular: Positive for palpitations. Negative for chest pain.  Gastrointestinal: Negative for constipation, diarrhea, nausea and vomiting.  Endocrine: Negative for polydipsia and polyuria.  Genitourinary: Negative for dysuria and hematuria.  Musculoskeletal: Negative for neck pain and neck stiffness.  Skin: Negative for rash.  Neurological: Positive for light-headedness. Negative for weakness, numbness and headaches.  Psychiatric/Behavioral: Negative for agitation and  behavioral problems.      Objective:    Physical Exam Vitals reviewed.  Constitutional:      General: He is not in acute distress.    Appearance: He is not diaphoretic.  HENT:     Head: Normocephalic and atraumatic.     Nose: Nose normal.     Mouth/Throat:     Mouth: Mucous membranes are moist.  Eyes:     General: No scleral icterus.    Extraocular Movements: Extraocular movements intact.     Pupils: Pupils are equal, round, and reactive to light.  Cardiovascular:     Rate and Rhythm: Normal rate  and regular rhythm.     Heart sounds: Normal heart sounds. No murmur heard.   Pulmonary:     Breath sounds: Normal breath sounds. No wheezing or rales.  Abdominal:     General: Bowel sounds are normal.     Palpations: Abdomen is soft.     Tenderness: There is no abdominal tenderness. There is no guarding or rebound.  Musculoskeletal:     Cervical back: Neck supple. No tenderness.     Right lower leg: No edema.     Left lower leg: No edema.  Skin:    General: Skin is warm.     Findings: No rash.  Neurological:     General: No focal deficit present.     Mental Status: He is alert and oriented to person, place, and time.     Sensory: No sensory deficit.     Motor: No weakness.  Psychiatric:        Mood and Affect: Mood normal.        Behavior: Behavior normal.     BP (!) 138/93 (BP Location: Right Arm, Patient Position: Sitting, Cuff Size: Normal)   Pulse 80   Temp 98.3 F (36.8 C) (Oral)   Resp 18   Ht 5\' 11"  (1.803 m)   Wt 225 lb (102.1 kg)   SpO2 98%   BMI 31.38 kg/m  Wt Readings from Last 3 Encounters:  12/13/19 225 lb (102.1 kg)  11/28/19 222 lb 12.8 oz (101.1 kg)  11/24/19 223 lb 6.4 oz (101.3 kg)     Health Maintenance Due  Topic Date Due  . Hepatitis C Screening  Never done  . TETANUS/TDAP  04/14/2018    There are no preventive care reminders to display for this patient.  Lab Results  Component Value Date   TSH 1.050 10/13/2019   Lab Results   Component Value Date   WBC 4.3 10/13/2019   HGB 15.8 10/13/2019   HCT 47.9 10/13/2019   MCV 91 10/13/2019   PLT 156 10/13/2019   Lab Results  Component Value Date   NA 139 10/13/2019   K 4.8 10/13/2019   CO2 23 10/13/2019   GLUCOSE 94 10/13/2019   BUN 9 10/13/2019   CREATININE 1.30 (H) 11/24/2019   BILITOT 0.3 10/13/2019   ALKPHOS 71 10/13/2019   AST 19 10/13/2019   ALT 20 10/13/2019   PROT 7.4 10/13/2019   ALBUMIN 4.5 10/13/2019   CALCIUM 9.4 10/13/2019   ANIONGAP 8 04/12/2019   Lab Results  Component Value Date   CHOL 170 06/03/2019   Lab Results  Component Value Date   HDL 73 06/03/2019   Lab Results  Component Value Date   LDLCALC 84 06/03/2019   Lab Results  Component Value Date   TRIG 45 06/03/2019   Lab Results  Component Value Date   CHOLHDL 2.3 06/03/2019   Lab Results  Component Value Date   HGBA1C 5.0 06/03/2019      Assessment & Plan:   Problem List Items Addressed This Visit      Cardiovascular and Mediastinum   Essential hypertension    BP Readings from Last 1 Encounters:  12/13/19 (!) 138/93   Stable with Amlodipine Counseled for compliance with the medications Advised DASH diet and moderate exercise/walking, at least 150 mins/week         Other   Dizziness - Primary    Likely related to situational anxiety and/or dehydration Avoid skipping meal Increase fluid intake Inderal for palpitations  Tachycardia    Patient reports resting tachycardia along with dizziness at times. HR in office around 80, but patient uses smartwatch that monitors HR at home, which shows HR as high as 140s Cardiology evaluation done, including Holter monitor - sinus tachycardia  Propranolol PRN       Anxiety    Advised to take Propranolol as needed for anxiety and palpitations Was on some medication, likely SSRI in the past, did not tolerate well.         No orders of the defined types were placed in this encounter.   Follow-up:  Return if symptoms worsen or fail to improve.    Lindell Spar, MD

## 2019-12-13 NOTE — Assessment & Plan Note (Signed)
Advised to take Propranolol as needed for anxiety and palpitations Was on some medication, likely SSRI in the past, did not tolerate well.

## 2019-12-13 NOTE — Assessment & Plan Note (Signed)
BP Readings from Last 1 Encounters:  12/13/19 (!) 138/93   Stable with Amlodipine Counseled for compliance with the medications Advised DASH diet and moderate exercise/walking, at least 150 mins/week

## 2019-12-20 ENCOUNTER — Other Ambulatory Visit: Payer: Self-pay | Admitting: Cardiology

## 2020-01-10 ENCOUNTER — Telehealth: Payer: Self-pay

## 2020-01-10 NOTE — Telephone Encounter (Signed)
PT is calling states that he is not any better, his quality of life is at a 2, and he would like to be referred to a ENT Dr,   He is still dizzy, still cant drive

## 2020-01-10 NOTE — Telephone Encounter (Signed)
Please advise 

## 2020-01-10 NOTE — Telephone Encounter (Signed)
Ps refer to ENT of his choice if he has one, or to soonest available ENT eval and treat 1 month vertigo

## 2020-01-11 ENCOUNTER — Other Ambulatory Visit: Payer: Self-pay | Admitting: Cardiology

## 2020-01-11 DIAGNOSIS — H6983 Other specified disorders of Eustachian tube, bilateral: Secondary | ICD-10-CM | POA: Insufficient documentation

## 2020-01-11 DIAGNOSIS — H6993 Unspecified Eustachian tube disorder, bilateral: Secondary | ICD-10-CM | POA: Insufficient documentation

## 2020-01-11 NOTE — Telephone Encounter (Signed)
Spoke with patient. He states he called and made an appt with ENT yesterday and will be seeing them today

## 2020-01-31 ENCOUNTER — Telehealth: Payer: Self-pay | Admitting: Cardiology

## 2020-01-31 NOTE — Telephone Encounter (Signed)
I am sorry that he is experiencing these symptoms.  Although I am sure this is frustrating for him, we have not found any obvious cardiac cause for the symptoms.  Coronary CTA from last year was very reassuring with calcium score of 0 and no evidence of CAD.  Consider follow-up with PCP to look for other potential causes.

## 2020-01-31 NOTE — Telephone Encounter (Signed)
Patient called and said he's been having tingling sensations in his left leg and foot, has discomfort in left side of chest goes to his armpits, and light headed, no dizzy spells. All of this has been going on since November.Marland KitchenMarland Kitchen

## 2020-01-31 NOTE — Telephone Encounter (Signed)
Pt states that he had chest discomfort and tingling from chest to left arm since November. Pt states that on 01/21/20 he did have SOB, no other SOB noted. Pt denies nausea. Pt rates chest discomfort 5/10. Pt reports that he has been lightheaded all the time. Unable to check BP D/T not being at home. Pt denies caffeine intake at this time.  Please advise.

## 2020-01-31 NOTE — Telephone Encounter (Signed)
Pt notified and states he has appt with PCP on tomorrow.

## 2020-02-01 ENCOUNTER — Telehealth: Payer: 59 | Admitting: Family Medicine

## 2020-02-01 ENCOUNTER — Other Ambulatory Visit: Payer: Self-pay

## 2020-02-01 ENCOUNTER — Ambulatory Visit: Payer: 59 | Admitting: Student

## 2020-02-01 VITALS — BP 125/90 | Wt 210.0 lb

## 2020-02-01 DIAGNOSIS — R42 Dizziness and giddiness: Secondary | ICD-10-CM | POA: Diagnosis not present

## 2020-02-01 DIAGNOSIS — J3089 Other allergic rhinitis: Secondary | ICD-10-CM

## 2020-02-01 DIAGNOSIS — R410 Disorientation, unspecified: Secondary | ICD-10-CM

## 2020-02-01 DIAGNOSIS — I10 Essential (primary) hypertension: Secondary | ICD-10-CM | POA: Diagnosis not present

## 2020-02-01 DIAGNOSIS — J302 Other seasonal allergic rhinitis: Secondary | ICD-10-CM

## 2020-02-01 NOTE — Progress Notes (Signed)
Virtual Visit via Telephone Note  I connected with Evan Jacobson on 02/01/20 at  3:20 PM EST by telephone and verified that I am speaking with the correct person using two identifiers.  Location: Patient: home Provider: office   I discussed the limitations, risks, security and privacy concerns of performing an evaluation and management service by telephone and the availability of in person appointments. I also discussed with the patient that there may be a patient responsible charge related to this service. The patient expressed understanding and agreed to proceed.   His2 month h/o light headedness, daily, has been eval by ENT, and dentist  States Cardiology recommended against another work as had  Normal [facility] eval last year Has approx 10 episodes/ day few seconds this also prevents him from falling asleep  Left leg and foot were numb, as though  Poor circu;lation , this has been constant in the past 10 days, also mid left hand to thumb felt as tho it would cramp Couple weeks ago after awaking from sleep he did not recognize himself, his wife , and his kids, this lasted about 30 mins Observations/Objective: BP 125/90   Wt 210 lb (95.3 kg)   BMI 29.29 kg/m  Good communication with no confusion and intact memory. Alert and oriented x 3 No signs of respiratory distress during speech    Assessment and Plan: Light headedness 2 month history , also reports confusion and inability to recognize family memebers, needs brain scan and neurology eval  Essential hypertension DASH diet and commitment to daily physical activity for a minimum of 30 minutes discussed and encouraged, as a part of hypertension management. The importance of attaining a healthy weight is also discussed.  BP/Weight 02/01/2020 12/13/2019 11/28/2019 11/24/2019 11/21/2019 10/21/2019 10/13/2019  Systolic BP 125 138 132 122 138 123 132  Diastolic BP 90 93 89 84 70 82 98  Wt. (Lbs) 210 225 222.8 223.4 223.8 220 215   BMI 29.29 31.38 31.07 31.16 31.21 30.68 29.99  Some encounter information is confidential and restricted. Go to Review Flowsheets activity to see all data.   Not adequately controlled per record, will check at home and  update    Seasonal and perennial allergic rhinitis Controlled, no change in medication     Follow Up Instructions:    I discussed the assessment and treatment plan with the patient. The patient was provided an opportunity to ask questions and all were answered. The patient agreed with the plan and demonstrated an understanding of the instructions.   The patient was advised to call back or seek an in-person evaluation if the symptoms worsen or if the condition fails to improve as anticipated.  I provided 22 minutes of non-face-to-face time during this encounter.   Syliva Overman, MD

## 2020-02-01 NOTE — Patient Instructions (Addendum)
F/u in office with MD end April, call if you need me sooner  You will be referred to [facility] and for brain scan  Take inderall as precribed, every 8 hours please  Flu and TDap  vaccine tomorrow morning in the office  Fasting chem7 an EGFR and magnesium It is important that you exercise regularly at least 30 minutes 5 times a week. If you develop chest pain, have severe difficulty breathing, or feel very tired, stop exercising immediately and seek medical attention    Think about what you will eat, plan ahead. Choose " clean, green, fresh or frozen" over canned, processed or packaged foods which are more sugary, salty and fatty. 70 to 75% of food eaten should be vegetables and fruit. Three meals at set times with snacks allowed between meals, but they must be fruit or vegetables. Aim to eat over a 12 hour period , example 7 am to 7 pm, and STOP after  your last meal of the day. Drink water,generally about 64 ounces per day, no other drink is as healthy. Fruit juice is best enjoyed in a healthy way, by EATING the fruit. Thanks for choosing [facility], we consider it a privelige to serve you.

## 2020-02-02 ENCOUNTER — Ambulatory Visit: Payer: 59

## 2020-02-03 ENCOUNTER — Ambulatory Visit (INDEPENDENT_AMBULATORY_CARE_PROVIDER_SITE_OTHER): Payer: 59

## 2020-02-03 ENCOUNTER — Other Ambulatory Visit: Payer: Self-pay

## 2020-02-03 DIAGNOSIS — Z23 Encounter for immunization: Secondary | ICD-10-CM

## 2020-02-04 LAB — BMP8+EGFR
BUN/Creatinine Ratio: 14 (ref 9–20)
BUN: 18 mg/dL (ref 6–24)
CO2: 24 mmol/L (ref 20–29)
Calcium: 9.6 mg/dL (ref 8.7–10.2)
Chloride: 105 mmol/L (ref 96–106)
Creatinine, Ser: 1.25 mg/dL (ref 0.76–1.27)
GFR calc Af Amer: 76 mL/min/{1.73_m2} (ref >59)
GFR calc non Af Amer: 65 mL/min/{1.73_m2} (ref >59)
Glucose: 89 mg/dL (ref 65–99)
Potassium: 4.3 mmol/L (ref 3.5–5.2)
Sodium: 141 mmol/L (ref 134–144)

## 2020-02-04 LAB — MAGNESIUM: Magnesium: 2.1 mg/dL (ref 1.6–2.3)

## 2020-02-08 ENCOUNTER — Encounter: Payer: Self-pay | Admitting: Family Medicine

## 2020-02-08 DIAGNOSIS — R42 Dizziness and giddiness: Secondary | ICD-10-CM | POA: Insufficient documentation

## 2020-02-08 NOTE — Assessment & Plan Note (Signed)
DASH diet and commitment to daily physical activity for a minimum of 30 minutes discussed and encouraged, as a part of hypertension management. The importance of attaining a healthy weight is also discussed.  BP/Weight 02/01/2020 12/13/2019 11/28/2019 11/24/2019 11/21/2019 10/21/2019 09/20/537  Systolic BP 767 341 937 902 409 735 329  Diastolic BP 90 93 89 84 70 82 98  Wt. (Lbs) 210 225 222.8 223.4 223.8 220 215  BMI 29.29 31.38 31.07 31.16 31.21 30.68 29.99  Some encounter information is confidential and restricted. Go to Review Flowsheets activity to see all data.   Not adequately controlled per record, will check at home and  update

## 2020-02-08 NOTE — Assessment & Plan Note (Signed)
2 month history , also reports confusion and inability to recognize family memebers, needs brain scan and neurology eval

## 2020-02-08 NOTE — Assessment & Plan Note (Signed)
Controlled, no change in medication  

## 2020-03-01 ENCOUNTER — Other Ambulatory Visit: Payer: Self-pay

## 2020-03-01 ENCOUNTER — Ambulatory Visit (HOSPITAL_COMMUNITY)
Admission: RE | Admit: 2020-03-01 | Discharge: 2020-03-01 | Disposition: A | Discharge status: Home / Self Care | Payer: 59 | Source: Ambulatory Visit | Attending: Family Medicine | Admitting: Family Medicine

## 2020-03-01 DIAGNOSIS — R42 Dizziness and giddiness: Secondary | ICD-10-CM | POA: Diagnosis present

## 2020-03-01 DIAGNOSIS — R410 Disorientation, unspecified: Secondary | ICD-10-CM | POA: Diagnosis present

## 2020-03-01 MED ORDER — GADOBUTROL 1 MMOL/ML IV SOLN
10.0000 mL | Freq: Once | INTRAVENOUS | Status: AC | PRN
  Administered 2020-03-01: 10 mL via INTRAVENOUS

## 2020-03-06 ENCOUNTER — Encounter: Payer: Self-pay | Admitting: Cardiology

## 2020-03-06 ENCOUNTER — Telehealth (INDEPENDENT_AMBULATORY_CARE_PROVIDER_SITE_OTHER): Payer: 59 | Admitting: Family Medicine

## 2020-03-06 ENCOUNTER — Other Ambulatory Visit: Payer: Self-pay

## 2020-03-06 ENCOUNTER — Ambulatory Visit (INDEPENDENT_AMBULATORY_CARE_PROVIDER_SITE_OTHER): Payer: 59

## 2020-03-06 ENCOUNTER — Ambulatory Visit: Payer: 59 | Admitting: Cardiology

## 2020-03-06 ENCOUNTER — Encounter: Payer: Self-pay | Admitting: Family Medicine

## 2020-03-06 VITALS — Ht 71.0 in | Wt 215.0 lb

## 2020-03-06 DIAGNOSIS — E663 Overweight: Secondary | ICD-10-CM | POA: Diagnosis not present

## 2020-03-06 DIAGNOSIS — I10 Essential (primary) hypertension: Secondary | ICD-10-CM

## 2020-03-06 DIAGNOSIS — M545 Low back pain, unspecified: Secondary | ICD-10-CM | POA: Diagnosis not present

## 2020-03-06 DIAGNOSIS — M5410 Radiculopathy, site unspecified: Secondary | ICD-10-CM

## 2020-03-06 MED ORDER — PREDNISONE 20 MG PO TABS: 20.0000 mg | ORAL_TABLET | Freq: Two times a day (BID) | ORAL | 0 refills | Status: DC

## 2020-03-06 MED ORDER — KETOROLAC TROMETHAMINE 60 MG/2ML IM SOLN
60.0000 mg | Freq: Once | INTRAMUSCULAR | Status: AC
  Administered 2020-03-06: 60 mg via INTRAMUSCULAR

## 2020-03-06 MED ORDER — PANTOPRAZOLE SODIUM 40 MG PO TBEC: 40.0000 mg | DELAYED_RELEASE_TABLET | Freq: Every day | ORAL | 0 refills | Status: DC

## 2020-03-06 MED ORDER — METHYLPREDNISOLONE ACETATE 80 MG/ML IJ SUSP
80.0000 mg | Freq: Once | INTRAMUSCULAR | Status: AC
  Administered 2020-03-06: 80 mg via INTRAMUSCULAR

## 2020-03-06 MED ORDER — CYCLOBENZAPRINE HCL 10 MG PO TABS: 10.0000 mg | ORAL_TABLET | Freq: Every day | ORAL | 0 refills | Status: DC

## 2020-03-06 NOTE — Progress Notes (Deleted)
Cardiology Office Note  Date: 03/06/2020   ID: Evan Jacobson, DOB March 08, 1965, MRN [id number]  PCP:  Fayrene Helper, MD  Cardiologist:  Rozann Lesches, MD Electrophysiologist:  None   No chief complaint on file.   History of Present Illness: Evan Jacobson is a 55 y.o. male last seen in October 2021.  Past Medical History:  Diagnosis Date  . Allergic rhinitis   . Bipolar disorder ([place]) 08/25/2011  . Chronic back pain   . Concussion 2012  . GERD (gastroesophageal reflux disease)   . Groin pain, chronic, right 06/28/2014  . Heart palpitations 06/18/2016  . Hypertension   . Persistent adjustment disorder 12/05/2015   Chronic adjustment disorder diagnosed by [facility] and counseling recomended  . PTSD (post-traumatic stress disorder)   . Sinusitis   . Skin lump of leg, left 11/17/2017  . Vertigo 01/08/2017    Past Surgical History:  Procedure Laterality Date  . COLONOSCOPY N/A 08/12/2017   Procedure: COLONOSCOPY;  Surgeon: Rogene Houston, MD;  [facility];  Service: Endoscopy;  Laterality: N/A;  730  . NO PAST SURGERIES      Current Outpatient Medications  Medication Sig Dispense Refill  . amLODipine (NORVASC) 2.5 MG tablet TAKE 3 TABLETS (7.5 MG TOTAL) BY MOUTH DAILY. 270 tablet 1  . cetirizine (ZYRTEC) 10 MG tablet Take 10 mg by mouth daily.    . cyclobenzaprine (FLEXERIL) 10 MG tablet Take 1 tablet (10 mg total) by mouth 2 (two) times daily as needed for muscle spasms. 20 tablet 0  . EPINEPHrine 0.3 mg/0.3 mL IJ SOAJ injection Inject 0.3 mg into the muscle once.     [name] Oil 500 MG CAPS Take 500 mg by mouth daily.     . Multiple Vitamin (MULTIVITAMIN WITH MINERALS) TABS tablet Take 2 tablets by mouth daily.     . propranolol (INDERAL) 10 MG tablet TAKE 1 TABLET EVERY 8 HOURS AS NEEDED FOR PALPITAIONS 90 tablet 3  . sildenafil (VIAGRA) 100 MG tablet Take 1 tablet (100 mg total) by mouth as needed for erectile dysfunction. 6 tablet 11   No current  facility-administered medications for this visit.   Allergies:  Sulfa antibiotics, Ace inhibitors, Sulfasalazine, and Sulfonamide derivatives   Social History: The patient  reports that he has never smoked. He has never used smokeless tobacco. He reports current alcohol use. He reports that he does not use drugs.   Family History: The patient's family history includes Heart disease in his mother; Hypertension in his brother, brother, father, mother, sister, and sister; Stroke in his father.   ROS:  Please see the history of present illness. Otherwise, complete review of systems is positive for {NONE DEFAULTED:18576::"none"}.  All other systems are reviewed and negative.   Physical Exam: VS:  There were no vitals taken for this visit., BMI There is no height or weight on file to calculate BMI.  Wt Readings from Last 3 Encounters:  02/01/20 210 lb (95.3 kg)  12/13/19 225 lb (102.1 kg)  11/28/19 222 lb 12.8 oz (101.1 kg)    General: Patient appears comfortable at rest. HEENT: Conjunctiva and lids normal, oropharynx clear with moist mucosa. Neck: Supple, no elevated JVP or carotid bruits, no thyromegaly. Lungs: Clear to auscultation, nonlabored breathing at rest. Cardiac: Regular rate and rhythm, no S3 or significant systolic murmur, no pericardial rub. Abdomen: Soft, nontender, no hepatomegaly, bowel sounds present, no guarding or rebound. Extremities: No pitting edema, distal pulses 2+. Skin: Warm  and dry. Musculoskeletal: No kyphosis. Neuropsychiatric: Alert and oriented x3, affect grossly appropriate.  ECG:  An ECG dated 11/24/2019 was personally reviewed today and demonstrated:  Sinus rhythm with nonspecific ST-T changes.  Recent Labwork: 10/13/2019: ALT 20; AST 19; Hemoglobin 15.8; Platelets 156; TSH 1.050 02/03/2020: BUN 18; Creatinine, Ser 1.25; Magnesium 2.1; Potassium 4.3; Sodium 141     Component Value Date/Time   CHOL 170 06/03/2019 0900   TRIG 45 06/03/2019 0900   HDL 73  06/03/2019 0900   CHOLHDL 2.3 06/03/2019 0900   VLDL 14 06/18/2016 0936   LDLCALC 84 06/03/2019 0900    Other Studies Reviewed Today:  30-day event recorder 02/16/2017: Available strips from 30-day event recorder reviewed. Sinus rhythm, sinus arrhythmia, and sinus tachycardia noted without clear correlation of specific heart rate to symptoms. No obvious sustained arrhythmias or pauses.  Carotid Dopplers 02/04/2017: IMPRESSION: 1. No significant carotid plaque or stenosis. 2. Antegrade bilateral vertebral arterial flow.  Coronary CTA 04/14/2019: FINDINGS: A 120 kV prospective scan was triggered in the descending thoracic aorta at 111 HU's. Axial non-contrast 3 mm slices were carried out through the heart. The data set was analyzed on a dedicated work station and scored using the [place]. Gantry rotation speed was 250 msecs and collimation was .6 mm. No beta blockade and 0.8 mg of sl NTG was given. The 3D data set was reconstructed in 5% intervals of the 67-82 % of the R-R cycle. Diastolic phases were analyzed on a dedicated work station using MPR, MIP and VRT modes. The patient received 80 cc of contrast.  Aorta: Normal size. Ascending aorta 3.0 cm. No calcifications. No dissection.  Aortic Valve: Trileaflet. No calcifications.  Coronary Arteries: Normal coronary origin. Right dominance.  RCA is a large dominant artery that gives rise to PDA and PLVB. There is mild plaque in the distal RCA just prior to the PDA.  Left main is a large artery that gives rise to LAD and LCX arteries.  LAD is a large vessel that has no plaque. There is a small D1 and large D2 without plaque.  LCX is a non-dominant artery that gives rise to a large OM1, large OM2 and small OM3. There is no plaque.  Other findings:  Normal pulmonary vein drainage into the left atrium.  Normal let atrial appendage without a thrombus.  Normal size of the pulmonary  artery.  IMPRESSION: 1. Coronary calcium score of 0. This was 0 percentile for age and sex matched control.  2. Normal coronary origin with right dominance.  3. No evidence of obstructive CAD.  Assessment and Plan:    Medication Adjustments/Labs and Tests Ordered: Current medicines are reviewed at length with the patient today.  Concerns regarding medicines are outlined above.   Tests Ordered: No orders of the defined types were placed in this encounter.   Medication Changes: No orders of the defined types were placed in this encounter.   Disposition:  Follow up {follow up:15908}  Signed, Satira Sark, MD, [facility] 03/06/2020 8:19 AM    [facility] [address], [place], [place] [address] Phone: [phone]; Fax: [phone]

## 2020-03-06 NOTE — Progress Notes (Signed)
toradol 60 and depo medrol 80 given IM with no complications.

## 2020-03-06 NOTE — Progress Notes (Signed)
Virtual Visit via Telephone Note  I connected with Evan Jacobson on 03/06/20 at  9:40 AM EST by telephone and verified that I am speaking with the correct person using two identifiers.  Location: Patient: home Provider: office   I discussed the limitations, risks, security and privacy concerns of performing an evaluation and management service by telephone and the availability of in person appointments. I also discussed with the patient that there may be a patient responsible charge related to this service. The patient expressed understanding and agreed to proceed.   History of Present Illness: 3 day h/o low back pain, no aggravating factor, rated at a 7 , just woke up with pain, non radiating, no new weakness or numbness , no incontinence, has established arthritis and disc disease in low back   Observations/Objective: Ht 5\' 11"  (1.803 m)   Wt 215 lb (97.5 kg)   BMI 29.99 kg/m  Good communication with no confusion and intact memory. Alert and oriented x 3 No signs of respiratory distress during speech    Assessment and Plan:  Acute back pain Short course of prednisone, flexeril and PPI prescribed. Nurse visit today for Toradol 60 mg and depo medrol 80 mg IM  Essential hypertension DASH diet and commitment to daily physical activity for a minimum of 30 minutes discussed and encouraged, as a part of hypertension management. The importance of attaining a healthy weight is also discussed.  BP/Weight 03/06/2020 02/01/2020 12/13/2019 11/28/2019 11/24/2019 11/21/2019 6/55/3748  Systolic BP - 270 786 754 492 010 071  Diastolic BP - 90 93 89 84 70 82  Wt. (Lbs) 215 210 225 222.8 223.4 223.8 220  BMI 29.99 29.29 31.38 31.07 31.16 31.21 30.68  Some encounter information is confidential and restricted. Go to Review Flowsheets activity to see all data.       Overweight (BMI 25.0-29.9)  Patient re-educated about  the importance of commitment to a  minimum of 150 minutes of exercise  per week as able.  The importance of healthy food choices with portion control discussed, as well as eating regularly and within a 12 hour window most days. The need to choose "clean , green" food 50 to 75% of the time is discussed, as well as to make water the primary drink and set a goal of 64 ounces water daily.    Weight /BMI 03/06/2020 02/01/2020 12/13/2019  WEIGHT 215 lb 210 lb 225 lb  HEIGHT 5\' 11"  - 5\' 11"   BMI 29.99 kg/m2 29.29 kg/m2 31.38 kg/m2  Some encounter information is confidential and restricted. Go to Review Flowsheets activity to see all data.       Follow Up Instructions:    I discussed the assessment and treatment plan with the patient. The patient was provided an opportunity to ask questions and all were answered. The patient agreed with the plan and demonstrated an understanding of the instructions.   The patient was advised to call back or seek an in-person evaluation if the symptoms worsen or if the condition fails to improve as anticipated.  I provided 14 minutes of non-face-to-face time during this encounter.   Tula Nakayama, MD

## 2020-03-06 NOTE — Patient Instructions (Addendum)
F/U as before, call if you need me sooner  You are treated for acute back pain  Please schedule appointment today for nurse visit for Toradol 60 mg IM and depo medrol 80 mg iM in the office for back pain  Three medications are prescribed, prednisone, cyclobenzaprine and protonix  Take as directed  Please take ibuprofen 800 mg once daily for the next 5 days, starting tomorrow   Thanks for choosing [facility], we consider it a privelige to serve you.

## 2020-03-10 ENCOUNTER — Encounter: Payer: Self-pay | Admitting: Family Medicine

## 2020-03-10 DIAGNOSIS — M549 Dorsalgia, unspecified: Secondary | ICD-10-CM | POA: Insufficient documentation

## 2020-03-10 DIAGNOSIS — M545 Low back pain, unspecified: Secondary | ICD-10-CM | POA: Insufficient documentation

## 2020-03-10 DIAGNOSIS — M5442 Lumbago with sciatica, left side: Secondary | ICD-10-CM | POA: Insufficient documentation

## 2020-03-10 NOTE — Assessment & Plan Note (Signed)
DASH diet and commitment to daily physical activity for a minimum of 30 minutes discussed and encouraged, as a part of hypertension management. The importance of attaining a healthy weight is also discussed.  BP/Weight 03/06/2020 02/01/2020 12/13/2019 11/28/2019 11/24/2019 11/21/2019 06/25/509  Systolic BP - 021 117 356 701 410 301  Diastolic BP - 90 93 89 84 70 82  Wt. (Lbs) 215 210 225 222.8 223.4 223.8 220  BMI 29.99 29.29 31.38 31.07 31.16 31.21 30.68  Some encounter information is confidential and restricted. Go to Review Flowsheets activity to see all data.

## 2020-03-10 NOTE — Assessment & Plan Note (Signed)
Short course of prednisone, flexeril and PPI prescribed. Nurse visit today for Toradol 60 mg and depo medrol 80 mg IM

## 2020-03-10 NOTE — Assessment & Plan Note (Signed)
  Patient re-educated about  the importance of commitment to a  minimum of 150 minutes of exercise per week as able.  The importance of healthy food choices with portion control discussed, as well as eating regularly and within a 12 hour window most days. The need to choose "clean , green" food 50 to 75% of the time is discussed, as well as to make water the primary drink and set a goal of 64 ounces water daily.    Weight /BMI 03/06/2020 02/01/2020 12/13/2019  WEIGHT 215 lb 210 lb 225 lb  HEIGHT 5\' 11"  - 5\' 11"   BMI 29.99 kg/m2 29.29 kg/m2 31.38 kg/m2  Some encounter information is confidential and restricted. Go to Review Flowsheets activity to see all data.

## 2020-03-22 ENCOUNTER — Encounter (HOSPITAL_COMMUNITY): Payer: Self-pay

## 2020-03-22 ENCOUNTER — Other Ambulatory Visit: Payer: Self-pay

## 2020-03-22 ENCOUNTER — Emergency Department (HOSPITAL_COMMUNITY)
Admission: EM | Admit: 2020-03-22 | Discharge: 2020-03-22 | Disposition: A | Discharge status: Home / Self Care | Payer: No Typology Code available for payment source | Attending: Emergency Medicine | Admitting: Emergency Medicine

## 2020-03-22 ENCOUNTER — Emergency Department (HOSPITAL_COMMUNITY): Payer: No Typology Code available for payment source

## 2020-03-22 DIAGNOSIS — K219 Gastro-esophageal reflux disease without esophagitis: Secondary | ICD-10-CM | POA: Insufficient documentation

## 2020-03-22 DIAGNOSIS — R1084 Generalized abdominal pain: Secondary | ICD-10-CM | POA: Diagnosis present

## 2020-03-22 DIAGNOSIS — Z79899 Other long term (current) drug therapy: Secondary | ICD-10-CM | POA: Insufficient documentation

## 2020-03-22 DIAGNOSIS — K5900 Constipation, unspecified: Secondary | ICD-10-CM

## 2020-03-22 DIAGNOSIS — I10 Essential (primary) hypertension: Secondary | ICD-10-CM | POA: Insufficient documentation

## 2020-03-22 LAB — CBC WITH DIFFERENTIAL/PLATELET
Abs Immature Granulocytes: 0.02 10*3/uL (ref 0.00–0.07)
Basophils Absolute: 0.1 10*3/uL (ref 0.0–0.1)
Basophils Relative: 1 %
Eosinophils Absolute: 0.5 10*3/uL (ref 0.0–0.5)
Eosinophils Relative: 10 %
HCT: 47.2 % (ref 39.0–52.0)
Hemoglobin: 15.3 g/dL (ref 13.0–17.0)
Immature Granulocytes: 0 %
Lymphocytes Relative: 32 %
Lymphs Abs: 1.6 10*3/uL (ref 0.7–4.0)
MCH: 29.7 pg (ref 26.0–34.0)
MCHC: 32.4 g/dL (ref 30.0–36.0)
MCV: 91.7 fL (ref 80.0–100.0)
Monocytes Absolute: 0.5 10*3/uL (ref 0.1–1.0)
Monocytes Relative: 10 %
Neutro Abs: 2.4 10*3/uL (ref 1.7–7.7)
Neutrophils Relative %: 47 %
Platelets: 216 10*3/uL (ref 150–400)
RBC: 5.15 MIL/uL (ref 4.22–5.81)
RDW: 12.2 % (ref 11.5–15.5)
WBC: 5 10*3/uL (ref 4.0–10.5)
nRBC: 0 % (ref 0.0–0.2)

## 2020-03-22 LAB — COMPREHENSIVE METABOLIC PANEL
ALT: 30 U/L (ref 0–44)
AST: 24 U/L (ref 15–41)
Albumin: 4.1 g/dL (ref 3.5–5.0)
Alkaline Phosphatase: 66 U/L (ref 38–126)
Anion gap: 9 (ref 5–15)
BUN: 18 mg/dL (ref 6–20)
CO2: 25 mmol/L (ref 22–32)
Calcium: 9 mg/dL (ref 8.9–10.3)
Chloride: 101 mmol/L (ref 98–111)
Creatinine, Ser: 1.06 mg/dL (ref 0.61–1.24)
GFR, Estimated: >60 mL/min (ref >60)
Glucose, Bld: 96 mg/dL (ref 70–99)
Potassium: 3.5 mmol/L (ref 3.5–5.1)
Sodium: 135 mmol/L (ref 135–145)
Total Bilirubin: 0.5 mg/dL (ref 0.3–1.2)
Total Protein: 8 g/dL (ref 6.5–8.1)

## 2020-03-22 LAB — LIPASE, BLOOD: Lipase: 28 U/L (ref 11–51)

## 2020-03-22 MED ORDER — LACTATED RINGERS IV BOLUS
1000.0000 mL | Freq: Once | INTRAVENOUS | Status: AC
  Administered 2020-03-22: 1000 mL via INTRAVENOUS

## 2020-03-22 MED ORDER — MORPHINE SULFATE (PF) 4 MG/ML IV SOLN
4.0000 mg | Freq: Once | INTRAVENOUS | Status: AC
  Administered 2020-03-22: 4 mg via INTRAVENOUS
  Filled 2020-03-22: qty 1

## 2020-03-22 MED ORDER — IOHEXOL 300 MG/ML  SOLN
100.0000 mL | Freq: Once | INTRAMUSCULAR | Status: AC | PRN
  Administered 2020-03-22: 100 mL via INTRAVENOUS

## 2020-03-22 MED ORDER — ONDANSETRON HCL 4 MG/2ML IJ SOLN
4.0000 mg | Freq: Once | INTRAMUSCULAR | Status: AC
  Administered 2020-03-22: 4 mg via INTRAVENOUS
  Filled 2020-03-22: qty 2

## 2020-03-22 NOTE — Discharge Instructions (Addendum)
CT scan without acute findings.  There was a lot of retained stool.  Would recommend MiraLAX once or twice a day for the next 7 days.  Return for any new or worse symptoms.

## 2020-03-22 NOTE — ED Notes (Signed)
Pt to CT

## 2020-03-22 NOTE — ED Triage Notes (Signed)
Pt c/o severe abdominal pain that started around 9 last night.  Pt states he has hx of ulcers.

## 2020-03-22 NOTE — ED Provider Notes (Signed)
Patient CT scan of the abdomen without any acute findings other than retained stool.  Which may have been responsible for the pain.  Patient's labs without any significant abnormality.  Patient states that he is completely pain-free currently.  All the symptoms resolved.  He did receive some pain medication but that should be out of his system by now.  Patient stable for discharge home.  We will have him do MiraLAX at home.  Patient does not need a work note.   Fredia Sorrow, MD 03/22/20 [phone]

## 2020-03-22 NOTE — ED Provider Notes (Signed)
[facility] EMERGENCY DEPARTMENT Provider Note   CSN: [id number] Arrival date & time: 03/22/20  0542   History Chief Complaint  Patient presents with  . Abdominal Pain    Evan Jacobson is a 55 y.o. male.  The history is provided by the patient.  Abdominal Pain He has history of hypertension, GERD and comes in because of severe abdominal pain which started at about 9 PM.  Pain is diffuse and does radiate to the back.  He denies nausea or vomiting.  Nothing makes the pain better, nothing makes it worse.  He rates his pain at 10/10.  He did drink some milk which gave some temporary relief.  He then tried drinking some Pepto-Bismol which did not help.  He thinks he had a similar episode many years ago but does not recall what the cause was.  He denies ethanol use for the last several days, states he averages 6 beers and 4 shots over the course of a week.  Past Medical History:  Diagnosis Date  . Allergic rhinitis   . Bipolar disorder ([place]) 08/25/2011  . Chronic back pain   . Concussion 2012  . GERD (gastroesophageal reflux disease)   . Groin pain, chronic, right 06/28/2014  . Heart palpitations 06/18/2016  . Hypertension   . Persistent adjustment disorder 12/05/2015   Chronic adjustment disorder diagnosed by [facility] and counseling recomended  . PTSD (post-traumatic stress disorder)   . Sinusitis   . Skin lump of leg, left 11/17/2017  . Vertigo 01/08/2017    Patient Active Problem List   Diagnosis Date Noted  . Acute back pain 03/10/2020  . Light headedness 02/08/2020  . Anxiety 12/13/2019  . Constipation 11/28/2019  . Tachycardia 08/17/2019  . Wrist pain, right 08/17/2019  . Vitamin D deficiency 06/02/2019  . Tinea versicolor 04/05/2019  . Overweight (BMI 25.0-29.9) 10/09/2018  . Depression, major, single episode, in partial remission ([place]) 07/26/2017  . Seasonal and perennial allergic rhinitis 06/30/2017  . Seizure ([place]) 03/24/2017  . Headache disorder 02/23/2017  . Syncope  02/06/2017  . Dizziness 01/08/2017  . Obesity (BMI 30.0-34.9) 05/20/2014  . Back pain with left-sided radiculopathy 01/16/2014  . Palpitations 01/16/2014  . PTSD (post-traumatic stress disorder) 07/29/2013  . Essential hypertension 06/19/2008  . ERECTILE DYSFUNCTION 11/07/2007  . GERD 08/13/2007    Past Surgical History:  Procedure Laterality Date  . COLONOSCOPY N/A 08/12/2017   Procedure: COLONOSCOPY;  Surgeon: Rogene Houston, MD;  Location: [facility] ENDO SUITE;  Service: Endoscopy;  Laterality: N/A;  730  . NO PAST SURGERIES         Family History  Problem Relation Age of Onset  . Stroke Father   . Hypertension Father   . Hypertension Mother        [name]  . Heart disease Mother        Angina  . Hypertension Sister   . Hypertension Sister   . Hypertension Brother   . Hypertension Brother   . Colon cancer Neg Hx     Social History   Tobacco Use  . Smoking status: Never Smoker  . Smokeless tobacco: Never Used  Vaping Use  . Vaping Use: Never used  Substance Use Topics  . Alcohol use: Yes    Comment: occ; six pack per week  . Drug use: No    Home Medications Prior to Admission medications   Medication Sig Start Date End Date Taking? Authorizing Provider  amLODipine (NORVASC) 2.5 MG tablet TAKE 3 TABLETS (7.5  MG TOTAL) BY MOUTH DAILY. 04/06/19   Fayrene Helper, MD  cetirizine (ZYRTEC) 10 MG tablet Take 10 mg by mouth daily.    [provider]  cyclobenzaprine (FLEXERIL) 10 MG tablet Take 1 tablet (10 mg total) by mouth at bedtime. 03/06/20   Fayrene Helper, MD  EPINEPHrine 0.3 mg/0.3 mL IJ SOAJ injection Inject 0.3 mg into the muscle once.  07/20/17   [provider]  [name] Oil 500 MG CAPS Take 500 mg by mouth daily.     [provider]  Multiple Vitamin (MULTIVITAMIN WITH MINERALS) TABS tablet Take 2 tablets by mouth daily.     [provider]  pantoprazole (PROTONIX) 40 MG tablet Take 1 tablet (40 mg total) by mouth daily.  03/06/20   Fayrene Helper, MD  predniSONE (DELTASONE) 20 MG tablet Take 1 tablet (20 mg total) by mouth 2 (two) times daily with a meal. 03/06/20   Fayrene Helper, MD  propranolol (INDERAL) 10 MG tablet TAKE 1 TABLET EVERY 8 HOURS AS NEEDED FOR PALPITAIONS 01/11/20   Satira Sark, MD  sildenafil (VIAGRA) 100 MG tablet Take 1 tablet (100 mg total) by mouth as needed for erectile dysfunction. 10/13/19   Lindell Spar, MD    Allergies    Sulfa antibiotics, Ace inhibitors, Sulfasalazine, and Sulfonamide derivatives  Review of Systems   Review of Systems  Gastrointestinal: Positive for abdominal pain.  All other systems reviewed and are negative.   Physical Exam Updated Vital Signs BP (!) 143/101   Pulse 81   Temp 98.1 F (36.7 C) (Oral)   Resp 18   Ht 5\' 11"  (1.803 m)   Wt 97.5 kg   SpO2 100%   BMI 29.99 kg/m   Physical Exam Vitals and nursing note reviewed.   55 year old male, in obvious pain, but in no acute distress. Vital signs are significant for elevated blood pressure. Oxygen saturation is 100%, which is normal. Head is normocephalic and atraumatic. PERRLA, EOMI. Oropharynx is clear. Neck is nontender and supple without adenopathy or JVD. Back is nontender and there is no CVA tenderness. Lungs are clear without rales, wheezes, or rhonchi. Chest is nontender. Heart has regular rate and rhythm without murmur. Abdomen is soft, flat, with moderate tenderness in the epigastric area.  There is no rebound or guarding.  There are no masses or hepatosplenomegaly and peristalsis is hypoactive. Extremities have no cyanosis or edema, full range of motion is present. Skin is warm and dry without rash. Neurologic: Mental status is normal, cranial nerves are intact, there are no motor or sensory deficits.  ED Results / Procedures / Treatments   Labs (all labs ordered are listed, but only abnormal results are displayed) Labs Reviewed  COMPREHENSIVE METABOLIC PANEL   LIPASE, BLOOD  CBC WITH DIFFERENTIAL/PLATELET    EKG EKG Interpretation  Date/Time:  Thursday March 22 2020 05:53:19 EST Ventricular Rate:  75 PR Interval:    QRS Duration: 98 QT Interval:  403 QTC Calculation: 451 R Axis:   70 Text Interpretation: Sinus rhythm RSR' in V1 or V2, probably normal variant Borderline repolarization abnormality When compared with ECG of 10/13/2019, No significant change was found Confirmed by Delora Fuel ([id number]) on 03/22/2020 5:57:51 AM   Radiology No results found.  Procedures Procedures   Medications Ordered in ED Medications  morphine 4 MG/ML injection 4 mg (has no administration in time range)  ondansetron (ZOFRAN) injection 4 mg (has no administration in time range)  lactated ringers bolus 1,000 mL (has no administration in time range)    ED Course  I have reviewed the triage vital signs and the nursing notes.  Pertinent labs & imaging results that were available during my care of the patient were reviewed by me and considered in my medical decision making (see chart for details).  MDM Rules/Calculators/A&P Abdominal pain of uncertain cause.  Consider peptic ulcer disease, GERD, cholecystitis, pancreatitis, diverticulitis, perforated viscus.  Differential includes conditions with significant morbidity and mortality.  He will be given IV fluids, morphine, ondansetron and will be sent for CT of abdomen and pelvis.  Old records are reviewed, and it is noted that he had a CT of abdomen and pelvis on 11/24/2019 which showed no significant pathology.  ECG shows no acute changes.  Labs are unremarkable.  Following above-noted treatment, patient was feeling much better.  He has gone for CT of abdomen and pelvis, with results pending.  Case is signed out to Dr. Rogene Houston.  Final Clinical Impression(s) / ED Diagnoses Final diagnoses:  Generalized abdominal pain    Rx / DC Orders ED Discharge Orders    None       Delora Fuel, MD 23/30/07  [phone]

## 2020-03-23 ENCOUNTER — Telehealth: Payer: Self-pay | Admitting: *Deleted

## 2020-03-23 NOTE — Telephone Encounter (Signed)
Transition Care Management Unsuccessful Follow-up Telephone Call  Date of discharge and from where:  03/22/2020 - [facility]  Attempts:  1st Attempt  Reason for unsuccessful TCM follow-up call:  Left voice message

## 2020-03-26 NOTE — Telephone Encounter (Signed)
Transition Care Management Follow-up Telephone Call  Date of discharge and from where: 03/22/2020 - [facility]  How have you been since you were released from the hospital? "Doing well"  Any questions or concerns? No  Items Reviewed:  Did the pt receive and understand the discharge instructions provided? Yes   Medications obtained and verified? Yes   Other? No   Any new allergies since your discharge? No   Dietary orders reviewed? Yes  Do you have support at home? Yes   Home Care and Equipment/Supplies: Were home health services ordered? not applicable If so, what is the name of the agency? N/A  Has the agency set up a time to come to the patient's home? not applicable Were any new equipment or medical supplies ordered?  No What is the name of the medical supply agency? N/A Were you able to get the supplies/equipment? not applicable Do you have any questions related to the use of the equipment or supplies? No  Functional Questionnaire: (I = Independent and D = Dependent) ADLs: I  Bathing/Dressing- I  Meal Prep- I  Eating- I  Maintaining continence- I  Transferring/Ambulation- I  Managing Meds- I  Follow up appointments reviewed:   PCP Hospital f/u appt confirmed? No  - has a routine appointment in May  Specialist Hospital f/u appt confirmed? No    Are transportation arrangements needed? No   If their condition worsens, is the pt aware to call PCP or go to the Emergency Dept.? Yes  Was the patient provided with contact information for the PCP's office or ED? Yes  Was to pt encouraged to call back with questions or concerns? Yes

## 2020-05-02 ENCOUNTER — Other Ambulatory Visit: Payer: Self-pay | Admitting: Family Medicine

## 2020-05-02 DIAGNOSIS — I10 Essential (primary) hypertension: Secondary | ICD-10-CM

## 2020-05-03 ENCOUNTER — Ambulatory Visit (INDEPENDENT_AMBULATORY_CARE_PROVIDER_SITE_OTHER): Payer: 59 | Admitting: Internal Medicine

## 2020-05-03 ENCOUNTER — Other Ambulatory Visit: Payer: Self-pay

## 2020-05-03 ENCOUNTER — Encounter: Payer: Self-pay | Admitting: Internal Medicine

## 2020-05-03 ENCOUNTER — Encounter: Payer: Self-pay | Admitting: Family Medicine

## 2020-05-03 VITALS — BP 142/95 | HR 85 | Resp 18 | Ht 71.0 in | Wt 220.4 lb

## 2020-05-03 DIAGNOSIS — F331 Major depressive disorder, recurrent, moderate: Secondary | ICD-10-CM | POA: Insufficient documentation

## 2020-05-03 DIAGNOSIS — M25569 Pain in unspecified knee: Secondary | ICD-10-CM | POA: Insufficient documentation

## 2020-05-03 DIAGNOSIS — IMO0002 Reserved for concepts with insufficient information to code with codable children: Secondary | ICD-10-CM | POA: Insufficient documentation

## 2020-05-03 DIAGNOSIS — I10 Essential (primary) hypertension: Secondary | ICD-10-CM

## 2020-05-03 MED ORDER — AMLODIPINE BESYLATE 10 MG PO TABS: 10.0000 mg | ORAL_TABLET | Freq: Every day | ORAL | 0 refills | Status: DC

## 2020-05-03 NOTE — Patient Instructions (Addendum)
Please start taking Amlodipine 10 mg once daily instead of 7.5 mg.  Please follow DASH diet and perform moderate exercise/walking at least 150 mins/week. [email or web address]">  DASH Eating Plan DASH stands for Dietary Approaches to Stop Hypertension. The DASH eating plan is a healthy eating plan that has been shown to:  Reduce high blood pressure (hypertension).  Reduce your risk for type 2 diabetes, heart disease, and stroke.  Help with weight loss. What are tips for following this plan? Reading food labels  Check food labels for the amount of salt (sodium) per serving. Choose foods with less than 5 percent of the Daily Value of sodium. Generally, foods with less than 300 milligrams (mg) of sodium per serving fit into this eating plan.  To find whole grains, look for the word "whole" as the first word in the ingredient list. Shopping  Buy products labeled as "low-sodium" or "no salt added."  Buy fresh foods. Avoid canned foods and pre-made or frozen meals. Cooking  Avoid adding salt when cooking. Use salt-free seasonings or herbs instead of table salt or sea salt. Check with your health care provider or pharmacist before using salt substitutes.  Do not fry foods. Cook foods using healthy methods such as baking, boiling, grilling, roasting, and broiling instead.  Cook with heart-healthy oils, such as olive, canola, avocado, soybean, or sunflower oil. Meal planning  Eat a balanced diet that includes: ? 4 or more servings of fruits and 4 or more servings of vegetables each day. Try to fill one-half of your plate with fruits and vegetables. ? 6-8 servings of whole grains each day. ? Less than 6 oz (170 g) of lean meat, poultry, or fish each day. A 3-oz (85-g) serving of meat is about the same size as a deck of cards. One egg equals 1 oz (28 g). ? 2-3 servings of low-fat dairy each day. One serving is 1 cup (237 mL). ? 1 serving of nuts,  seeds, or beans 5 times each week. ? 2-3 servings of heart-healthy fats. Healthy fats called omega-3 fatty acids are found in foods such as walnuts, flaxseeds, fortified milks, and eggs. These fats are also found in cold-water fish, such as sardines, salmon, and mackerel.  Limit how much you eat of: ? Canned or prepackaged foods. ? Food that is high in trans fat, such as some fried foods. ? Food that is high in saturated fat, such as fatty meat. ? Desserts and other sweets, sugary drinks, and other foods with added sugar. ? Full-fat dairy products.  Do not salt foods before eating.  Do not eat more than 4 egg yolks a week.  Try to eat at least 2 vegetarian meals a week.  Eat more home-cooked food and less restaurant, buffet, and fast food.   Lifestyle  When eating at a restaurant, ask that your food be prepared with less salt or no salt, if possible.  If you drink alcohol: ? Limit how much you use to:  0-1 drink a day for women who are not pregnant.  0-2 drinks a day for men. ? Be aware of how much alcohol is in your drink. In the [place] one drink equals one 12 oz bottle of beer (355 mL), one 5 oz glass of wine (148 mL), or one 1 oz glass of hard liquor (44 mL). General information  Avoid eating more than 2,300 mg of salt a day. If you have hypertension, you may need to reduce your sodium intake to 1,500 mg a  day.  Work with your health care provider to maintain a healthy body weight or to lose weight. Ask what an ideal weight is for you.  Get at least 30 minutes of exercise that causes your heart to beat faster (aerobic exercise) most days of the week. Activities may include walking, swimming, or biking.  Work with your health care provider or dietitian to adjust your eating plan to your individual calorie needs. What foods should I eat? Fruits All fresh, dried, or frozen fruit. Canned fruit in natural juice (without added sugar). Vegetables Fresh or frozen vegetables (raw,  steamed, roasted, or grilled). Low-sodium or reduced-sodium tomato and vegetable juice. Low-sodium or reduced-sodium tomato sauce and tomato paste. Low-sodium or reduced-sodium canned vegetables. Grains Whole-grain or whole-wheat bread. Whole-grain or whole-wheat pasta. Brown rice. [place]. Bulgur. Whole-grain and low-sodium cereals. Pita bread. Low-fat, low-sodium crackers. Whole-wheat flour tortillas. Meats and other proteins Skinless chicken or [place]. Ground chicken or [place]. Pork with fat trimmed off. Fish and seafood. Egg whites. Dried beans, peas, or lentils. Unsalted nuts, nut butters, and seeds. Unsalted canned beans. Lean cuts of beef with fat trimmed off. Low-sodium, lean precooked or cured meat, such as sausages or meat loaves. Dairy Low-fat (1%) or fat-free (skim) milk. Reduced-fat, low-fat, or fat-free cheeses. Nonfat, low-sodium ricotta or cottage cheese. Low-fat or nonfat yogurt. Low-fat, low-sodium cheese. Fats and oils Soft margarine without trans fats. Vegetable oil. Reduced-fat, low-fat, or light mayonnaise and salad dressings (reduced-sodium). Canola, safflower, olive, avocado, soybean, and sunflower oils. Avocado. Seasonings and condiments Herbs. Spices. Seasoning mixes without salt. Other foods Unsalted popcorn and pretzels. Fat-free sweets. The items listed above may not be a complete list of foods and beverages you can eat. Contact a dietitian for more information. What foods should I avoid? Fruits Canned fruit in a light or heavy syrup. Fried fruit. Fruit in cream or butter sauce. Vegetables Creamed or fried vegetables. Vegetables in a cheese sauce. Regular canned vegetables (not low-sodium or reduced-sodium). Regular canned tomato sauce and paste (not low-sodium or reduced-sodium). Regular tomato and vegetable juice (not low-sodium or reduced-sodium). Angie Fava. Olives. Grains Baked goods made with fat, such as croissants, muffins, or some breads. Dry pasta or  rice meal packs. Meats and other proteins Fatty cuts of meat. Ribs. Fried meat. Berniece Salines. Bologna, salami, and other precooked or cured meats, such as sausages or meat loaves. Fat from the back of a pig (fatback). Bratwurst. Salted nuts and seeds. Canned beans with added salt. Canned or smoked fish. Whole eggs or egg yolks. Chicken or [place] with skin. Dairy Whole or 2% milk, cream, and half-and-half. Whole or full-fat cream cheese. Whole-fat or sweetened yogurt. Full-fat cheese. Nondairy creamers. Whipped toppings. Processed cheese and cheese spreads. Fats and oils Butter. Stick margarine. Lard. Shortening. Ghee. Bacon fat. Tropical oils, such as coconut, palm kernel, or palm oil. Seasonings and condiments Onion salt, garlic salt, seasoned salt, table salt, and sea salt. Worcestershire sauce. Tartar sauce. Barbecue sauce. Teriyaki sauce. Soy sauce, including reduced-sodium. Steak sauce. Canned and packaged gravies. Fish sauce. Oyster sauce. Cocktail sauce. Store-bought horseradish. Ketchup. Mustard. Meat flavorings and tenderizers. Bouillon cubes. Hot sauces. Pre-made or packaged marinades. Pre-made or packaged taco seasonings. Relishes. Regular salad dressings. Other foods Salted popcorn and pretzels. The items listed above may not be a complete list of foods and beverages you should avoid. Contact a dietitian for more information. Where to find more information  National Heart, Lung, and Blood Institute: https://wilson-eaton.com/  American Heart Association: www.heart.org  Academy of  Nutrition and Dietetics: www.eatright.[place]: www.kidney.org Summary  The DASH eating plan is a healthy eating plan that has been shown to reduce high blood pressure (hypertension). It may also reduce your risk for type 2 diabetes, heart disease, and stroke.  When on the DASH eating plan, aim to eat more fresh fruits and vegetables, whole grains, lean proteins, low-fat dairy, and heart-healthy  fats.  With the DASH eating plan, you should limit salt (sodium) intake to 2,300 mg a day. If you have hypertension, you may need to reduce your sodium intake to 1,500 mg a day.  Work with your health care provider or dietitian to adjust your eating plan to your individual calorie needs. This information is not intended to replace advice given to you by your health care provider. Make sure you discuss any questions you have with your health care provider. Document Revised: 12/17/2018 Document Reviewed: 12/17/2018 Elsevier Patient Education  2021 [employer].

## 2020-05-03 NOTE — Telephone Encounter (Signed)
Pt set up an appt 05-03-20 with dr patel

## 2020-05-03 NOTE — Assessment & Plan Note (Signed)
BP Readings from Last 1 Encounters:  05/03/20 (!) 142/95   uncontrolled with Amlodipine 7.5 mg QD Increased dose of Amlodipine to 10 mg QD Counseled for compliance with the medications Advised DASH diet and moderate exercise/walking, at least 150 mins/week

## 2020-05-03 NOTE — Progress Notes (Signed)
Established Patient Office Visit  Subjective:  Patient ID: Evan Jacobson, male    DOB: 01-28-1965  Age: 55 y.o. MRN: [id number]  CC:  Chief Complaint  Patient presents with  . Hypertension    Pt has been having high blood pressure since Sunday with headaches in the back of his head and sometimes toward the front     HPI PRUDENCIO VELAZCO  is a 55 year old male with past medical history of hypertension, GERD, erectile dysfunction and PTSDpresents for follow-up of HTN.  His BP has been running around 150/100 at home. He has been having occipital and frontal headache and intermittent floaters. Denies any dizziness, chest pain, dyspnea. He has been taking Amlodipine 7.5 mg QD.  Past Medical History:  Diagnosis Date  . Allergic rhinitis   . Bipolar disorder ([place]) 08/25/2011  . Chronic back pain   . Concussion 2012  . GERD (gastroesophageal reflux disease)   . Groin pain, chronic, right 06/28/2014  . Heart palpitations 06/18/2016  . Hypertension   . Persistent adjustment disorder 12/05/2015   Chronic adjustment disorder diagnosed by [facility] and counseling recomended  . PTSD (post-traumatic stress disorder)   . Sinusitis   . Skin lump of leg, left 11/17/2017  . Vertigo 01/08/2017    Past Surgical History:  Procedure Laterality Date  . COLONOSCOPY N/A 08/12/2017   Procedure: COLONOSCOPY;  Surgeon: Rogene Houston, MD;  Location: [facility];  Service: Endoscopy;  Laterality: N/A;  730  . NO PAST SURGERIES      Family History  Problem Relation Age of Onset  . Stroke Father   . Hypertension Father   . Hypertension Mother        [name]  . Heart disease Mother        Angina  . Hypertension Sister   . Hypertension Sister   . Hypertension Brother   . Hypertension Brother   . Colon cancer Neg Hx     Social History   Socioeconomic History  . Marital status: Married    Spouse name: Not on file  . Number of children: 2  . Years of education: Not on file  . Highest education  level: Not on file  Occupational History    Comment: Audio video tech    Employer: [employer]  Tobacco Use  . Smoking status: Never Smoker  . Smokeless tobacco: Never Used  Vaping Use  . Vaping Use: Never used  Substance and Sexual Activity  . Alcohol use: Yes    Comment: occ; six pack per week  . Drug use: No  . Sexual activity: Yes    Birth control/protection: None  Other Topics Concern  . Not on file  Social History Narrative  . Not on file   Social Determinants of Health   Financial Resource Strain: Not on file  Food Insecurity: Not on file  Transportation Needs: Not on file  Physical Activity: Not on file  Stress: Not on file  Social Connections: Not on file  Intimate Partner Violence: Not on file    Outpatient Medications Prior to Visit  Medication Sig Dispense Refill  . cetirizine (ZYRTEC) 10 MG tablet Take 10 mg by mouth daily.    . cyclobenzaprine (FLEXERIL) 10 MG tablet Take 1 tablet (10 mg total) by mouth at bedtime. 10 tablet 0  . EPINEPHrine 0.3 mg/0.3 mL IJ SOAJ injection Inject 0.3 mg into the muscle once.     [name] Oil 500 MG CAPS Take 500 mg by mouth  daily.     . Multiple Vitamin (MULTIVITAMIN WITH MINERALS) TABS tablet Take 2 tablets by mouth daily.     . pantoprazole (PROTONIX) 40 MG tablet Take 1 tablet (40 mg total) by mouth daily. 15 tablet 0  . predniSONE (DELTASONE) 20 MG tablet Take 1 tablet (20 mg total) by mouth 2 (two) times daily with a meal. 10 tablet 0  . propranolol (INDERAL) 10 MG tablet TAKE 1 TABLET EVERY 8 HOURS AS NEEDED FOR PALPITAIONS 90 tablet 3  . sildenafil (VIAGRA) 100 MG tablet Take 1 tablet (100 mg total) by mouth as needed for erectile dysfunction. 6 tablet 11  . amLODipine (NORVASC) 2.5 MG tablet TAKE 3 TABLETS (7.5 MG TOTAL) BY MOUTH DAILY. 270 tablet 1   No facility-administered medications prior to visit.    Allergies  Allergen Reactions  . Sulfa Antibiotics Anaphylaxis  . Ace Inhibitors Other (See Comments)    Feels  as though throat is closing up intermittently  . No Known Allergies     Other reaction(s): MALE ERECTILE DISORDER  . Sulfasalazine Other (See Comments)    Tongue swelled   . Sulfonamide Derivatives Other (See Comments)    Tongue swelled  . Sulfamethoxazole-Trimethoprim Hives and Rash    ROS Review of Systems  Constitutional: Negative for chills and fever.  HENT: Negative for congestion and sore throat.   Eyes: Positive for visual disturbance. Negative for pain and discharge.  Respiratory: Negative for cough and shortness of breath.   Cardiovascular: Negative for chest pain and palpitations.  Gastrointestinal: Negative for constipation, diarrhea, nausea and vomiting.  Endocrine: Negative for polydipsia and polyuria.  Genitourinary: Negative for dysuria and hematuria.  Musculoskeletal: Negative for neck pain and neck stiffness.  Skin: Negative for rash.  Neurological: Positive for headaches. Negative for weakness and numbness.  Psychiatric/Behavioral: Negative for agitation and behavioral problems.      Objective:    Physical Exam Vitals reviewed.  Constitutional:      General: He is not in acute distress.    Appearance: He is not diaphoretic.  HENT:     Head: Normocephalic and atraumatic.     Nose: Nose normal.     Mouth/Throat:     Mouth: Mucous membranes are moist.  Eyes:     General: No scleral icterus.    Extraocular Movements: Extraocular movements intact.  Cardiovascular:     Rate and Rhythm: Normal rate and regular rhythm.     Pulses: Normal pulses.     Heart sounds: Normal heart sounds. No murmur heard.   Pulmonary:     Breath sounds: Normal breath sounds. No wheezing or rales.  Musculoskeletal:     Cervical back: Neck supple. No tenderness.     Right lower leg: No edema.     Left lower leg: No edema.  Skin:    General: Skin is warm.     Findings: No rash.  Neurological:     General: No focal deficit present.     Mental Status: He is alert and  oriented to person, place, and time.     Sensory: No sensory deficit.     Motor: No weakness.  Psychiatric:        Mood and Affect: Mood normal.        Behavior: Behavior normal.     BP (!) 142/95 (BP Location: Right Arm, Patient Position: Sitting, Cuff Size: Normal)   Pulse 85   Resp 18   Ht 5\' 11"  (1.803 m)   Wt 220  lb 6.4 oz (100 kg)   SpO2 97%   BMI 30.74 kg/m  Wt Readings from Last 3 Encounters:  05/03/20 220 lb 6.4 oz (100 kg)  03/22/20 215 lb (97.5 kg)  03/06/20 215 lb (97.5 kg)     There are no preventive care reminders to display for this patient.  There are no preventive care reminders to display for this patient.  Lab Results  Component Value Date   TSH 1.050 10/13/2019   Lab Results  Component Value Date   WBC 5.0 03/22/2020   HGB 15.3 03/22/2020   HCT 47.2 03/22/2020   MCV 91.7 03/22/2020   PLT 216 03/22/2020   Lab Results  Component Value Date   NA 135 03/22/2020   K 3.5 03/22/2020   CO2 25 03/22/2020   GLUCOSE 96 03/22/2020   BUN 18 03/22/2020   CREATININE 1.06 03/22/2020   BILITOT 0.5 03/22/2020   ALKPHOS 66 03/22/2020   AST 24 03/22/2020   ALT 30 03/22/2020   PROT 8.0 03/22/2020   ALBUMIN 4.1 03/22/2020   CALCIUM 9.0 03/22/2020   ANIONGAP 9 03/22/2020   Lab Results  Component Value Date   CHOL 170 06/03/2019   Lab Results  Component Value Date   HDL 73 06/03/2019   Lab Results  Component Value Date   LDLCALC 84 06/03/2019   Lab Results  Component Value Date   TRIG 45 06/03/2019   Lab Results  Component Value Date   CHOLHDL 2.3 06/03/2019   Lab Results  Component Value Date   HGBA1C 5.0 06/03/2019      Assessment & Plan:   Problem List Items Addressed This Visit      Cardiovascular and Mediastinum   Essential hypertension - Primary    BP Readings from Last 1 Encounters:  05/03/20 (!) 142/95   uncontrolled with Amlodipine 7.5 mg QD Increased dose of Amlodipine to 10 mg QD Counseled for compliance with the  medications Advised DASH diet and moderate exercise/walking, at least 150 mins/week       Relevant Medications   amLODipine (NORVASC) 10 MG tablet      Meds ordered this encounter  Medications  . amLODipine (NORVASC) 10 MG tablet    Sig: Take 1 tablet (10 mg total) by mouth daily.    Dispense:  90 tablet    Refill:  0    Follow-up: Return in about 3 months (around 08/02/2020) for HTN.    Lindell Spar, MD

## 2020-05-08 ENCOUNTER — Ambulatory Visit: Payer: No Typology Code available for payment source | Admitting: Internal Medicine

## 2020-05-23 ENCOUNTER — Encounter: Payer: Self-pay | Admitting: Family Medicine

## 2020-05-28 ENCOUNTER — Encounter: Payer: Self-pay | Admitting: Family Medicine

## 2020-05-28 ENCOUNTER — Other Ambulatory Visit: Payer: Self-pay

## 2020-05-28 ENCOUNTER — Ambulatory Visit (INDEPENDENT_AMBULATORY_CARE_PROVIDER_SITE_OTHER): Payer: No Typology Code available for payment source | Admitting: Family Medicine

## 2020-05-28 VITALS — BP 135/88 | HR 88 | Temp 97.8°F | Ht 71.0 in | Wt 218.0 lb

## 2020-05-28 DIAGNOSIS — Z125 Encounter for screening for malignant neoplasm of prostate: Secondary | ICD-10-CM

## 2020-05-28 DIAGNOSIS — R42 Dizziness and giddiness: Secondary | ICD-10-CM

## 2020-05-28 DIAGNOSIS — Z13220 Encounter for screening for lipoid disorders: Secondary | ICD-10-CM

## 2020-05-28 DIAGNOSIS — F411 Generalized anxiety disorder: Secondary | ICD-10-CM | POA: Diagnosis not present

## 2020-05-28 DIAGNOSIS — F322 Major depressive disorder, single episode, severe without psychotic features: Secondary | ICD-10-CM

## 2020-05-28 DIAGNOSIS — I10 Essential (primary) hypertension: Secondary | ICD-10-CM | POA: Diagnosis not present

## 2020-05-28 DIAGNOSIS — Z1329 Encounter for screening for other suspected endocrine disorder: Secondary | ICD-10-CM

## 2020-05-28 DIAGNOSIS — K219 Gastro-esophageal reflux disease without esophagitis: Secondary | ICD-10-CM

## 2020-05-28 DIAGNOSIS — E559 Vitamin D deficiency, unspecified: Secondary | ICD-10-CM

## 2020-05-28 DIAGNOSIS — E669 Obesity, unspecified: Secondary | ICD-10-CM

## 2020-05-28 MED ORDER — FLUOXETINE HCL 20 MG PO TABS: 20.0000 mg | ORAL_TABLET | Freq: Every day | ORAL | 3 refills | Status: DC

## 2020-05-28 NOTE — Assessment & Plan Note (Signed)
Controlled, no change in medication DASH diet and commitment to daily physical activity for a minimum of 30 minutes discussed and encouraged, as a part of hypertension management. The importance of attaining a healthy weight is also discussed.  BP/Weight 05/28/2020 05/03/2020 03/22/2020 03/06/2020 02/01/2020 12/13/2019 21/09/7586  Systolic BP 325 498 264 - 158 309 407  Diastolic BP 88 95 680 - 90 93 89  Wt. (Lbs) 218 220.4 215 215 210 225 222.8  BMI 30.4 30.74 29.99 29.99 29.29 31.38 31.07  Some encounter information is confidential and restricted. Go to Review Flowsheets activity to see all data.

## 2020-05-28 NOTE — Patient Instructions (Addendum)
F/U in 8 weeks, call if you need me before  Please come for fasting lipid, TSH, PSA and Vit D next Monday  Please  Keep neurology appointment, we will get this scheduled and let you know   I DO BELIEVE that mental health n management with medication and therapy will make you feel 80 % better and that this what you need  Start fluoxetine 20 mg one daily today  Please discuss and commit to establishing care with psychiatry , as soon as possible, I recommend community services./ Please send a message once you agree to referral  It is important that you exercise regularly at least 30 minutes 5 times a week. If you develop chest pain, have severe difficulty breathing, or feel very tired, stop exercising immediately and seek medical attention  Think about what you will eat, plan ahead. Choose " clean, green, fresh or frozen" over canned, processed or packaged foods which are more sugary, salty and fatty. 70 to 75% of food eaten should be vegetables and fruit. Three meals at set times with snacks allowed between meals, but they must be fruit or vegetables. Aim to eat over a 12 hour period , example 7 am to 7 pm, and STOP after  your last meal of the day. Drink water,generally about 64 ounces per day, no other drink is as healthy. Fruit juice is best enjoyed in a healthy way, by EATING the fruit. Thanks for choosing [facility], we consider it a privelige to serve you.

## 2020-05-28 NOTE — Assessment & Plan Note (Signed)
recurrent light headedness and abnormal sensations, needs to follow through with neurology

## 2020-05-28 NOTE — Assessment & Plan Note (Signed)
Start daily fluoxetine, needs psych management

## 2020-05-28 NOTE — Assessment & Plan Note (Signed)
Start fluoxetine, treament wih psychiatry indicated, will call back for referral

## 2020-05-28 NOTE — Progress Notes (Signed)
Evan Jacobson     MRN: [id number]      DOB: 1965-11-11   HPI Evan Jacobson is here for follow up and re-evaluation of chronic medical conditions, medication management and review of any available recent lab and radiology data.  Preventive health is updated, specifically  Cancer screening and Immunization.   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 Denies sinus pressure, nasal congestion, ear pain or sore throat. Denies chest congestion, productive cough or wheezing. Denies chest pains, palpitations and leg swelling Denies abdominal pain, nausea, vomiting,diarrhea or constipation.   Denies dysuria, frequency, hesitancy or incontinence. Denies joint pain, swelling and limitation in mobility. Denies headaches, seizures, numbness, or tingling. . Denies skin break down or rash.   PE  BP 135/88 (BP Location: Right Arm, Patient Position: Sitting, Cuff Size: Large)   Pulse 88   Temp 97.8 F (36.6 C) (Temporal)   Ht 5\' 11"  (1.803 m)   Wt 218 lb (98.9 kg)   SpO2 98%   BMI 30.40 kg/m   Patient alert and oriented and in no cardiopulmonary distress.  HEENT: No facial asymmetry, EOMI,     Neck supple .  Chest: Clear to auscultation bilaterally.  CVS: S1, S2 no murmurs, no S3.Regular rate.  nder.   Ext: No edema  MS: Adequate ROM spine, shoulders, hips and knees.  Skin: Intact, no ulcerations or rash noted.  Psych: Good eye contact, positive depression and anxiety score, not suicidal or homicidal.  CNS: CN 2-12 intact, power,  normal throughout.no focal deficits noted.   Assessment & Plan  Essential hypertension Controlled, no change in medication DASH diet and commitment  to daily physical activity for a minimum of 30 minutes discussed and encouraged, as a part of hypertension management. The importance of attaining a healthy weight is also discussed.  BP/Weight 05/28/2020 05/03/2020 03/22/2020 03/06/2020 02/01/2020 12/13/2019 89/03/8099  Systolic BP 751 025 852 - 778 242 353  Diastolic BP 88 95 614 - 90 93 89  Wt. (Lbs) 218 220.4 215 215 210 225 222.8  BMI 30.4 30.74 29.99 29.99 29.29 31.38 31.07  Some encounter information is confidential and restricted. Go to Review Flowsheets activity to see all data.       Depression Start fluoxetine, treament wih psychiatry indicated, will call back for referral  GERD asymptomatic off medications, eats " yeast rolls"  GAD (generalized anxiety disorder) Start daily fluoxetine, needs psych management  Obesity (BMI 30.0-34.9)  Patient re-educated about  the importance of commitment to a  minimum of 150 minutes of exercise per week as able.  The importance of healthy food choices with portion control discussed, as well as eating regularly and within a 12 hour window most days. The need to choose "clean , green" food 50 to 75% of the time is discussed, as well as to make water the primary drink and set a goal of 64 ounces water daily.    Weight /BMI 05/28/2020 05/03/2020 03/22/2020  WEIGHT 218 lb 220 lb 6.4 oz 215 lb  HEIGHT 5\' 11"  5\' 11"  5\' 11"   BMI 30.4 kg/m2 30.74 kg/m2 29.99 kg/m2  Some  encounter information is confidential and restricted. Go to Review Flowsheets activity to see all data.      Light-headed feeling recurrent light headedness and abnormal sensations, needs to follow through with neurology

## 2020-05-28 NOTE — Assessment & Plan Note (Signed)
  Patient re-educated about  the importance of commitment to a  minimum of 150 minutes of exercise per week as able.  The importance of healthy food choices with portion control discussed, as well as eating regularly and within a 12 hour window most days. The need to choose "clean , green" food 50 to 75% of the time is discussed, as well as to make water the primary drink and set a goal of 64 ounces water daily.    Weight /BMI 05/28/2020 05/03/2020 03/22/2020  WEIGHT 218 lb 220 lb 6.4 oz 215 lb  HEIGHT 5\' 11"  5\' 11"  5\' 11"   BMI 30.4 kg/m2 30.74 kg/m2 29.99 kg/m2  Some encounter information is confidential and restricted. Go to Review Flowsheets activity to see all data.

## 2020-05-28 NOTE — Assessment & Plan Note (Signed)
asymptomatic off medications, eats " yeast rolls"

## 2020-05-28 NOTE — Telephone Encounter (Signed)
Pt was in today for office visit.

## 2020-06-05 ENCOUNTER — Encounter: Payer: Self-pay | Admitting: Internal Medicine

## 2020-06-05 ENCOUNTER — Other Ambulatory Visit: Payer: Self-pay

## 2020-06-05 ENCOUNTER — Ambulatory Visit (INDEPENDENT_AMBULATORY_CARE_PROVIDER_SITE_OTHER): Payer: 59 | Admitting: Internal Medicine

## 2020-06-05 VITALS — BP 137/89 | HR 83 | Temp 98.4°F | Resp 18 | Ht 71.0 in | Wt 220.1 lb

## 2020-06-05 DIAGNOSIS — J329 Chronic sinusitis, unspecified: Secondary | ICD-10-CM | POA: Diagnosis not present

## 2020-06-05 DIAGNOSIS — R42 Dizziness and giddiness: Secondary | ICD-10-CM

## 2020-06-05 LAB — PSA: Prostate Specific Ag, Serum: 1.8 ng/mL (ref 0.0–4.0)

## 2020-06-05 LAB — LIPID PANEL
Chol/HDL Ratio: 3.5 ratio (ref 0.0–5.0)
Cholesterol, Total: 185 mg/dL (ref 100–199)
HDL: 53 mg/dL (ref >39)
LDL Chol Calc (NIH): 120 mg/dL — ABNORMAL HIGH (ref 0–99)
Triglycerides: 63 mg/dL (ref 0–149)
VLDL Cholesterol Cal: 12 mg/dL (ref 5–40)

## 2020-06-05 LAB — TSH: TSH: 0.946 u[IU]/mL (ref 0.450–4.500)

## 2020-06-05 LAB — VITAMIN D 25 HYDROXY (VIT D DEFICIENCY, FRACTURES): Vit D, 25-Hydroxy: 63.1 ng/mL (ref 30.0–100.0)

## 2020-06-05 MED ORDER — MECLIZINE HCL 50 MG PO TABS: 50.0000 mg | ORAL_TABLET | Freq: Two times a day (BID) | ORAL | 0 refills | Status: DC | PRN

## 2020-06-05 MED ORDER — FLUTICASONE PROPIONATE 50 MCG/ACT NA SUSP: 2.0000 | Freq: Every day | NASAL | 6 refills | Status: DC

## 2020-06-05 NOTE — Progress Notes (Signed)
Established Patient Office Visit  Subjective:  Patient ID: Evan Jacobson, male    DOB: [birth year]  Age: 55 y.o. MRN: [id number]  CC:  Chief Complaint  Patient presents with  . Dizziness    Pt has been having light headedness for awhile now when he gets in car feels like he is getting motion sickness and sometimes when car stops feels like its still moving     HPI Evan Jacobson presents for evaluation of dizziness for last few months. He has episodes of lightheadedness, especially with sunlight exposure and stress. His wife has noted that he sways to one side at times. He has had MRI of brain and Carotid doppler recently, which were noncontributory. He has chronic sinusitis, for which he uses Flonase.  He reports h/o vertigo in the past, which was evaluated by ENT and Neurology. Chart review suggests he has TMJ joint dysfunction in the past, which was fixed by his Dentist at [facility] by adjusting his denture. He had a Dentist visit last year and was told that it was in appropriate position. ENT evaluation suggested possible eustachian tube dysfunction and suggested Vestibular rehab. He is to get Neurology evaluation with Dr Evan Jacobson. Denies any vomiting, hearing difficulty or tinnitus.  Past Medical History:  Diagnosis Date  . Allergic rhinitis   . Bipolar disorder ([place]) 08/25/2011  . Chronic back pain   . Concussion 2012  . GERD (gastroesophageal reflux disease)   . Groin pain, chronic, right 06/28/2014  . Heart palpitations 06/18/2016  . Hypertension   . Persistent adjustment disorder 12/05/2015   Chronic adjustment disorder diagnosed by [facility] and counseling recomended  . PTSD (post-traumatic stress disorder)   . Sinusitis   . Skin lump of leg, left 11/17/2017  . Vertigo 01/08/2017    Past Surgical History:  Procedure Laterality Date  . COLONOSCOPY N/A 08/12/2017   Procedure: COLONOSCOPY;  Surgeon: Rogene Houston, MD;  Location: [facility];  Service: Endoscopy;  Laterality: N/A;   730  . NO PAST SURGERIES      Family History  Problem Relation Age of Onset  . Stroke Father   . Hypertension Father   . Hypertension Mother        Evan Jacobson  . Heart disease Mother        Angina  . Hypertension Sister   . Hypertension Sister   . Hypertension Brother   . Hypertension Brother   . Colon cancer Neg Hx     Social History   Socioeconomic History  . Marital status: Married    Spouse name: Not on file  . Number of children: 2  . Years of education: Not on file  . Highest education level: Not on file  Occupational History    Comment: Audio video tech    Employer: [employer]  Tobacco Use  . Smoking status: Never Smoker  . Smokeless tobacco: Never Used  Vaping Use  . Vaping Use: Never used  Substance and Sexual Activity  . Alcohol use: Yes    Comment: occ; six pack per week  . Drug use: No  . Sexual activity: Yes    Birth control/protection: None  Other Topics Concern  . Not on file  Social History Narrative  . Not on file   Social Determinants of Health   Financial Resource Strain: Not on file  Food Insecurity: Not on file  Transportation Needs: Not on file  Physical Activity: Not on file  Stress: Not on file  Social Connections:  Not on file  Intimate Partner Violence: Not on file    Outpatient Medications Prior to Visit  Medication Sig Dispense Refill  . amLODipine (NORVASC) 10 MG tablet Take 1 tablet (10 mg total) by mouth daily. 90 tablet 0  . cetirizine (ZYRTEC) 10 MG tablet Take 10 mg by mouth daily.    Marland Kitchen EPINEPHrine 0.3 mg/0.3 mL IJ SOAJ injection Inject 0.3 mg into the muscle once.     Marland Kitchen FLUoxetine (PROZAC) 20 MG tablet Take 1 tablet (20 mg total) by mouth daily. 30 tablet 3  . Krill Oil 500 MG CAPS Take 500 mg by mouth daily.     . Multiple Vitamin (MULTIVITAMIN WITH MINERALS) TABS tablet Take 2 tablets by mouth daily.     . sildenafil (VIAGRA) 100 MG tablet Take 1 tablet (100 mg total) by mouth as needed for erectile dysfunction. 6 tablet 11    No facility-administered medications prior to visit.    Allergies  Allergen Reactions  . Sulfa Antibiotics Anaphylaxis  . Ace Inhibitors Other (See Comments)    Feels as though throat is closing up intermittently  . No Known Allergies     Other reaction(s): MALE ERECTILE DISORDER  . Sulfasalazine Other (See Comments)    Tongue swelled   . Sulfonamide Derivatives Other (See Comments)    Tongue swelled  . Sulfamethoxazole-Trimethoprim Hives and Rash    ROS Review of Systems  Constitutional: Negative for chills and fever.  HENT: Negative for congestion and sore throat.   Eyes: Negative for pain and discharge.  Respiratory: Negative for cough and shortness of breath.   Cardiovascular: Negative for chest pain and palpitations.  Gastrointestinal: Negative for constipation, diarrhea, nausea and vomiting.  Endocrine: Negative for polydipsia and polyuria.  Genitourinary: Negative for dysuria and hematuria.  Musculoskeletal: Negative for neck pain and neck stiffness.  Skin: Negative for rash.  Neurological: Positive for light-headedness. Negative for weakness, numbness and headaches.  Psychiatric/Behavioral: Negative for agitation and behavioral problems.      Objective:    Physical Exam Vitals reviewed.  Constitutional:      General: He is not in acute distress.    Appearance: He is not diaphoretic.  HENT:     Head: Normocephalic and atraumatic.     Nose: Nose normal.     Mouth/Throat:     Mouth: Mucous membranes are moist.  Eyes:     General: No scleral icterus.    Extraocular Movements: Extraocular movements intact.  Cardiovascular:     Rate and Rhythm: Normal rate and regular rhythm.     Heart sounds: Normal heart sounds. No murmur heard.   Pulmonary:     Breath sounds: Normal breath sounds. No wheezing or rales.  Abdominal:     General: Bowel sounds are normal.     Palpations: Abdomen is soft.     Tenderness: There is no abdominal tenderness. There is no  guarding or rebound.  Musculoskeletal:     Cervical back: Neck supple. No tenderness.     Right lower leg: No edema.     Left lower leg: No edema.  Skin:    General: Skin is warm.     Findings: No rash.  Neurological:     General: No focal deficit present.     Mental Status: He is alert and oriented to person, place, and time.     Cranial Nerves: No cranial nerve deficit.     Sensory: No sensory deficit.     Motor: No weakness.  Psychiatric:        Mood and Affect: Mood normal.        Behavior: Behavior normal.     BP 137/89 (BP Location: Right Arm, Patient Position: Sitting, Cuff Size: Normal)   Pulse 83   Temp 98.4 F (36.9 C) (Oral)   Resp 18   Ht 5\' 11"  (1.803 m)   Wt 220 lb 1.9 oz (99.8 kg)   SpO2 97%   BMI 30.70 kg/m  Wt Readings from Last 3 Encounters:  06/05/20 220 lb 1.9 oz (99.8 kg)  05/28/20 218 lb (98.9 kg)  05/03/20 220 lb 6.4 oz (100 kg)     There are no preventive care reminders to display for this patient.  There are no preventive care reminders to display for this patient.  Lab Results  Component Value Date   TSH 0.946 06/04/2020   Lab Results  Component Value Date   WBC 5.0 03/22/2020   HGB 15.3 03/22/2020   HCT 47.2 03/22/2020   MCV 91.7 03/22/2020   PLT 216 03/22/2020   Lab Results  Component Value Date   NA 135 03/22/2020   K 3.5 03/22/2020   CO2 25 03/22/2020   GLUCOSE 96 03/22/2020   BUN 18 03/22/2020   CREATININE 1.06 03/22/2020   BILITOT 0.5 03/22/2020   ALKPHOS 66 03/22/2020   AST 24 03/22/2020   ALT 30 03/22/2020   PROT 8.0 03/22/2020   ALBUMIN 4.1 03/22/2020   CALCIUM 9.0 03/22/2020   ANIONGAP 9 03/22/2020   Lab Results  Component Value Date   CHOL 185 06/04/2020   Lab Results  Component Value Date   HDL 53 06/04/2020   Lab Results  Component Value Date   LDLCALC 120 (H) 06/04/2020   Lab Results  Component Value Date   TRIG 63 06/04/2020   Lab Results  Component Value Date   CHOLHDL 3.5 06/04/2020    Lab Results  Component Value Date   HGBA1C 5.0 06/03/2019      Assessment & Plan:   Problem List Items Addressed This Visit   None   Visit Diagnoses    Vertigo    -  Primary Meclizine PRN Referred for vestibular therapy Advised to follow up with Neurology as scheduled MRI brain reviewed - sinusitis, advised to use Flonase and PRN nasal saline spray Carotid doppler unremarkable Avoid sudden positional changes Avoid excessive sunlight or noisy environment    Relevant Medications   meclizine (ANTIVERT) 50 MG tablet   Other Relevant Orders   Ambulatory referral to Physical Therapy   Chronic sinusitis, unspecified location       Relevant Medications   fluticasone (FLONASE) 50 MCG/ACT nasal spray      Meds ordered this encounter  Medications  . meclizine (ANTIVERT) 50 MG tablet    Sig: Take 1 tablet (50 mg total) by mouth 2 (two) times daily as needed for dizziness.    Dispense:  30 tablet    Refill:  0  . fluticasone (FLONASE) 50 MCG/ACT nasal spray    Sig: Place 2 sprays into both nostrils daily.    Dispense:  16 g    Refill:  6    Follow-up: Return if symptoms worsen or fail to improve.    Lindell Spar, MD

## 2020-06-05 NOTE — Patient Instructions (Signed)
Vertigo Vertigo is the feeling that you or the things around you are moving when they are not. This feeling can come and go at any time. Vertigo often goes away on its own. This condition can be dangerous if it happens when you are doing activities like driving or working with machines. Your doctor will do tests to find the cause of your vertigo. These tests will also help your doctor decide on the best treatment for you. Follow these instructions at home: Eating and drinking  Drink enough fluid to keep your pee (urine) pale yellow.  Do not drink alcohol.      Activity  Return to your normal activities as told by your doctor. Ask your doctor what activities are safe for you.  In the morning, first sit up on the side of the bed. When you feel okay, stand slowly while you hold onto something until you know that your balance is fine.  Move slowly. Avoid sudden body or head movements or certain positions, as told by your doctor.  Use a cane if you have trouble standing or walking.  Sit down right away if you feel dizzy.  Avoid doing any tasks or activities that can cause danger to you or others if you get dizzy.  Avoid bending down if you feel dizzy. Place items in your home so that they are easy for you to reach without leaning over.  Do not drive or use heavy machinery if you feel dizzy. General instructions  Take over-the-counter and prescription medicines only as told by your doctor.  Keep all follow-up visits as told by your doctor. This is important. Contact a doctor if:  Your medicine does not help your vertigo.  You have a fever.  Your problems get worse or you have new symptoms.  Your family or friends see changes in your behavior.  The feeling of being sick to your stomach gets worse.  Your vomiting gets worse.  You lose feeling (have numbness) in part of your body.  You feel prickling and tingling in a part of your body. Get help right away if:  You have  trouble moving or talking.  You are always dizzy.  You pass out (faint).  You get very bad headaches.  You feel weak in your hands, arms, or legs.  You have changes in your hearing.  You have changes in how you see (vision).  You get a stiff neck.  Bright light starts to bother you. Summary  Vertigo is the feeling that you or the things around you are moving when they are not.  Your doctor will do tests to find the cause of your vertigo.  You may be told to avoid some tasks, positions, or movements.  Contact a doctor if your medicine is not helping, or if you have a fever, new symptoms, or a change in behavior.  Get help right away if you get very bad headaches, or if you have changes in how you speak, hear, or see. This information is not intended to replace advice given to you by your health care provider. Make sure you discuss any questions you have with your health care provider. Document Revised: 12/07/2017 Document Reviewed: 12/07/2017 Elsevier Patient Education  2021 [employer].

## 2020-06-13 ENCOUNTER — Other Ambulatory Visit: Payer: Self-pay | Admitting: Internal Medicine

## 2020-06-13 DIAGNOSIS — I10 Essential (primary) hypertension: Secondary | ICD-10-CM

## 2020-06-13 MED ORDER — HYDROCHLOROTHIAZIDE 12.5 MG PO CAPS: 12.5000 mg | ORAL_CAPSULE | Freq: Every day | ORAL | 2 refills | Status: DC

## 2020-06-13 NOTE — Telephone Encounter (Signed)
Pt advised medication has been sent to pharmacy. He was already set up with dr Moshe Cipro 6-27 advised to keep this appt updated notes to follow  up HTN

## 2020-06-19 ENCOUNTER — Encounter: Payer: Self-pay | Admitting: Family Medicine

## 2020-06-22 ENCOUNTER — Other Ambulatory Visit: Payer: Self-pay | Admitting: Family Medicine

## 2020-06-29 ENCOUNTER — Other Ambulatory Visit: Payer: Self-pay | Admitting: Internal Medicine

## 2020-06-29 DIAGNOSIS — I10 Essential (primary) hypertension: Secondary | ICD-10-CM

## 2020-07-10 ENCOUNTER — Other Ambulatory Visit: Payer: Self-pay | Admitting: Internal Medicine

## 2020-07-10 DIAGNOSIS — R42 Dizziness and giddiness: Secondary | ICD-10-CM

## 2020-07-10 DIAGNOSIS — R5382 Chronic fatigue, unspecified: Secondary | ICD-10-CM

## 2020-07-10 NOTE — Telephone Encounter (Signed)
Pt advised to get labs drawn at 8am

## 2020-07-10 NOTE — Addendum Note (Signed)
Addended byIhor Dow on: 07/10/2020 08:11 AM   Modules accepted: Orders

## 2020-07-12 LAB — HEMOGLOBIN A1C
Hgb A1c MFr Bld: 5.2 % of total Hgb (ref <5.7)
Mean Plasma Glucose: 103 mg/dL
eAG (mmol/L): 5.7 mmol/L

## 2020-07-15 LAB — TESTOSTERONE,FREE AND TOTAL
Testosterone, Free: 5.2 pg/mL — ABNORMAL LOW (ref 7.2–24.0)
Testosterone: 202 ng/dL — ABNORMAL LOW (ref 264–916)

## 2020-07-15 LAB — CORTISOL: Cortisol: 8.8 ug/dL

## 2020-07-15 LAB — VITAMIN B12: Vitamin B-12: 846 pg/mL (ref 232–1245)

## 2020-07-18 ENCOUNTER — Encounter: Payer: Self-pay | Admitting: Family Medicine

## 2020-07-18 ENCOUNTER — Other Ambulatory Visit: Payer: Self-pay

## 2020-07-18 ENCOUNTER — Ambulatory Visit (INDEPENDENT_AMBULATORY_CARE_PROVIDER_SITE_OTHER): Payer: 59 | Admitting: Family Medicine

## 2020-07-18 VITALS — BP 137/84 | HR 94 | Temp 99.4°F | Resp 20 | Ht 71.0 in | Wt 215.0 lb

## 2020-07-18 DIAGNOSIS — F322 Major depressive disorder, single episode, severe without psychotic features: Secondary | ICD-10-CM

## 2020-07-18 DIAGNOSIS — E663 Overweight: Secondary | ICD-10-CM

## 2020-07-18 DIAGNOSIS — R5383 Other fatigue: Secondary | ICD-10-CM

## 2020-07-18 DIAGNOSIS — I10 Essential (primary) hypertension: Secondary | ICD-10-CM

## 2020-07-18 DIAGNOSIS — R0683 Snoring: Secondary | ICD-10-CM

## 2020-07-18 DIAGNOSIS — R7989 Other specified abnormal findings of blood chemistry: Secondary | ICD-10-CM

## 2020-07-18 MED ORDER — FLUOXETINE HCL 20 MG PO CAPS: ORAL_CAPSULE | ORAL | 1 refills | Status: DC

## 2020-07-18 MED ORDER — AMLODIPINE BESYLATE 10 MG PO TABS: 1.0000 | ORAL_TABLET | Freq: Every day | ORAL | 1 refills | Status: DC

## 2020-07-18 MED ORDER — HYDROCHLOROTHIAZIDE 12.5 MG PO CAPS: ORAL_CAPSULE | ORAL | 1 refills | Status: DC

## 2020-07-18 NOTE — Patient Instructions (Addendum)
Please reschedule June and July appointments to the end of August, call if you need me sooner.  You have responded greatly to fluoxetine and your depression score has gone from a high of 18 down to 6.  Continue current dose of medication.  Blood pressure and weight have both improved.Continue to remain alcohol free  You are referred to lung specialist with snoring and fatigue to be evaluated for sleep apnea.  You are referred to urologist because of fatigue and low testosterone.  Nurse to refill two blood pressure medications as well as fluoxetine with 90-day supplies please  It is important that you exercise regularly at least 30 minutes 5 times a week. If you develop chest pain, have severe difficulty breathing, or feel very tired, stop exercising immediately and seek medical attention  Think about what you will eat, plan ahead. Choose " clean, green, fresh or frozen" over canned, processed or packaged foods which are more sugary, salty and fatty. 70 to 75% of food eaten should be vegetables and fruit. Three meals at set times with snacks allowed between meals, but they must be fruit or vegetables. Aim to eat over a 12 hour period , example 7 am to 7 pm, and STOP after  your last meal of the day. Drink water,generally about 64 ounces per day, no other drink is as healthy. Fruit juice is best enjoyed in a healthy way, by EATING the fruit. Thanks for choosing [facility], we consider it a privelige to serve you.

## 2020-07-20 ENCOUNTER — Encounter: Payer: Self-pay | Admitting: Family Medicine

## 2020-07-20 NOTE — Assessment & Plan Note (Signed)
Snoring and fatigue , refer for eval of OSA

## 2020-07-20 NOTE — Assessment & Plan Note (Signed)
Improved  Patient re-educated about  the importance of commitment to a  minimum of 150 minutes of exercise per week as able.  The importance of healthy food choices with portion control discussed, as well as eating regularly and within a 12 hour window most days. The need to choose "clean , green" food 50 to 75% of the time is discussed, as well as to make water the primary drink and set a goal of 64 ounces water daily.    Weight /BMI 07/18/2020 06/05/2020 05/28/2020  WEIGHT 215 lb 220 lb 1.9 oz 218 lb  HEIGHT 5\' 11"  5\' 11"  5\' 11"   BMI 29.99 kg/m2 30.7 kg/m2 30.4 kg/m2  Some encounter information is confidential and restricted. Go to Review Flowsheets activity to see all data.

## 2020-07-20 NOTE — Progress Notes (Signed)
Evan Jacobson     MRN: [id number]      DOB: 11-06-1965   HPI Evan Jacobson is here for follow up and re-evaluation of chronic medical conditions, medication management and review of any available recent lab and radiology data.  Preventive health is updated, specifically  Cancer screening and Immunization.   The PT denies any adverse reactions to current medications since the last visit.  C/o chronic fatigue, snoring and low testosterone and needs these addressed  ROS Denies recent fever or chills.c/o fatigue and has ecently learned that his testosterone level is low, wants to discuss this Denies sinus pressure, nasal congestion, ear pain or sore throat. Denies chest congestion, productive cough or wheezing. Denies chest pains, palpitations and leg swelling Denies abdominal pain, nausea, vomiting,diarrhea or constipation.   Denies dysuria, frequency, hesitancy or incontinence. Denies joint pain, swelling and limitation in mobility. Denies headaches, seizures, numbness, or tingling. Denies uncontrolled depression, anxiety or insomnia. Denies skin break down or rash.   PE  BP 137/84 (BP Location: Right Arm, Patient Position: Sitting, Cuff Size: Large)   Pulse 94   Temp 99.4 F (37.4 C)   Resp 20   Ht 5\' 11"  (1.803 m)   Wt 215 lb (97.5 kg)   SpO2 94%   BMI 29.99 kg/m   Patient alert and oriented and in no cardiopulmonary distress.  HEENT: No facial asymmetry, EOMI,     Neck supple .  Chest: Clear to auscultation bilaterally.  CVS: S1, S2 no murmurs, no S3.Regular rate.  ABD: Soft non tender.   Ext: No edema  MS: Adequate ROM spine, shoulders, hips and knees.  Skin: Intact, no ulcerations or rash noted.  Psych: Good eye contact, normal affect. Memory intact not anxious or depressed appearing.  CNS: CN 2-12 intact, power,  normal throughout.no focal deficits noted.   Assessment & Plan  Depression Score of 6 in 06/2020, excellent response to fluoxetine 20 mg,  continue same  Essential hypertension Controlled, no change in medication DASH diet and commitment to daily physical activity for a minimum of 30 minutes discussed and encouraged, as a part of hypertension management. The importance of attaining a healthy weight is also discussed.  BP/Weight 07/18/2020 06/05/2020 05/28/2020 05/03/2020 03/22/2020 03/03/5807 10/04/3380  Systolic BP 505 397 673 419 379 - 024  Diastolic BP 84 89 88 95 097 - 90  Wt. (Lbs) 215 220.12 218 220.4 215 215 210  BMI 29.99 30.7 30.4 30.74 29.99 29.99 29.29  Some encounter information is confidential and restricted. Go to Review Flowsheets activity to see all data.       Snoring Snoring and fatigue , refer for eval of OSA  Overweight (BMI 25.0-29.9) Improved  Patient re-educated about  the importance of commitment to a  minimum of 150 minutes of exercise per week as able.  The importance of healthy food choices with portion control discussed, as well as eating regularly and within a 12 hour window most days. The need to choose "clean , green" food 50 to 75% of the time is discussed, as well as to make water the primary drink and set a goal of 64 ounces water daily.    Weight /BMI 07/18/2020 06/05/2020 05/28/2020  WEIGHT 215 lb 220 lb 1.9 oz 218 lb  HEIGHT 5\' 11"  5\' 11"  5\' 11"   BMI 29.99 kg/m2 30.7 kg/m2 30.4 kg/m2  Some encounter information is confidential and restricted. Go to Review Flowsheets activity to see all data.

## 2020-07-20 NOTE — Assessment & Plan Note (Signed)
Score of 6 in 06/2020, excellent response to fluoxetine 20 mg, continue same

## 2020-07-20 NOTE — Assessment & Plan Note (Signed)
Controlled, no change in medication DASH diet and commitment to daily physical activity for a minimum of 30 minutes discussed and encouraged, as a part of hypertension management. The importance of attaining a healthy weight is also discussed.  BP/Weight 07/18/2020 06/05/2020 05/28/2020 05/03/2020 03/22/2020 03/07/1914 6/0/6004  Systolic BP 599 774 142 395 320 - 233  Diastolic BP 84 89 88 95 435 - 90  Wt. (Lbs) 215 220.12 218 220.4 215 215 210  BMI 29.99 30.7 30.4 30.74 29.99 29.99 29.29  Some encounter information is confidential and restricted. Go to Review Flowsheets activity to see all data.

## 2020-07-23 ENCOUNTER — Ambulatory Visit: Payer: No Typology Code available for payment source | Admitting: Family Medicine

## 2020-08-02 ENCOUNTER — Ambulatory Visit: Payer: No Typology Code available for payment source | Admitting: Family Medicine

## 2020-09-05 ENCOUNTER — Other Ambulatory Visit: Payer: Self-pay

## 2020-09-05 ENCOUNTER — Ambulatory Visit (INDEPENDENT_AMBULATORY_CARE_PROVIDER_SITE_OTHER): Payer: 59 | Admitting: Urology

## 2020-09-05 ENCOUNTER — Encounter: Payer: Self-pay | Admitting: Urology

## 2020-09-05 VITALS — BP 129/84 | HR 76 | Ht 71.0 in | Wt 226.4 lb

## 2020-09-05 DIAGNOSIS — N5201 Erectile dysfunction due to arterial insufficiency: Secondary | ICD-10-CM | POA: Diagnosis not present

## 2020-09-05 DIAGNOSIS — R7989 Other specified abnormal findings of blood chemistry: Secondary | ICD-10-CM

## 2020-09-05 NOTE — Progress Notes (Signed)
Urological Symptom Review  Patient is experiencing the following symptoms: Frequent urination Get up at night to urinate Leakage of urine Erection problems (male only)   Review of Systems  Gastrointestinal (upper)  : Indigestion/heartburn  Gastrointestinal (lower) : Negative for lower GI symptoms  Constitutional : Negative for symptoms  Skin: Negative for skin symptoms  Eyes: Negative for eye symptoms  Ear/Nose/Throat : Sinus problems  Hematologic/Lymphatic: Negative for Hematologic/Lymphatic symptoms  Cardiovascular : Negative for cardiovascular symptoms  Respiratory : Negative for respiratory symptoms  Endocrine: Negative for endocrine symptoms  Musculoskeletal: Back pain  Neurological: Dizziness  Psychologic: Depression Anxiety

## 2020-09-05 NOTE — Progress Notes (Signed)
09/05/2020 3:34 PM   Evan Jacobson 03/06/1965 [id number]  Referring provider: Fayrene Helper, MD [address], [address] [place],  [place] [address]  Low testosterone   HPI: Mr Jeanmarie is a 55yo here for evaluation of low testosterone and erectile dysfunction. Testosterone 202 in 06/2020. He has decreased energy and fatigues easily. He has difficulty losing weight. B12 normal. No injury to his testicles. He is retired English as a second language teacher. He has erectile dysfunction and takes sildenafil '100mg'$  PRN.    PMH: Past Medical History:  Diagnosis Date   Allergic rhinitis    Bipolar disorder ([place]) 08/25/2011   Chronic back pain    Concussion 2012   GERD (gastroesophageal reflux disease)    Groin pain, chronic, right 06/28/2014   Heart palpitations 06/18/2016   Hypertension    Persistent adjustment disorder 12/05/2015   Chronic adjustment disorder diagnosed by [facility] and counseling recomended   PTSD (post-traumatic stress disorder)    Sinusitis    Skin lump of leg, left 11/17/2017   Vertigo 01/08/2017    Surgical History: Past Surgical History:  Procedure Laterality Date   COLONOSCOPY N/A 08/12/2017   Procedure: COLONOSCOPY;  Surgeon: Rogene Houston, MD;  [facility];  Service: Endoscopy;  Laterality: N/A;  730   NO PAST SURGERIES      Home Medications:  Allergies as of 09/05/2020       Reactions   Sulfa Antibiotics Anaphylaxis   Ace Inhibitors Other (See Comments)   Feels as though throat is closing up intermittently   No Known Allergies    Other reaction(s): MALE ERECTILE DISORDER   Sulfasalazine Other (See Comments)   Tongue swelled   Sulfonamide Derivatives Other (See Comments)   Tongue swelled   Sulfamethoxazole-trimethoprim Hives, Rash        Medication List        Accurate as of September 05, 2020  3:34 PM. If you have any questions, ask your nurse or doctor.          amLODipine 10 MG tablet Commonly known as: NORVASC Take 1 tablet (10 mg total) by  mouth daily.   cetirizine 10 MG tablet Commonly known as: ZYRTEC Take 10 mg by mouth daily.   EPINEPHrine 0.3 mg/0.3 mL Soaj injection Commonly known as: EPI-PEN Inject 0.3 mg into the muscle once.   FLUoxetine 20 MG capsule Commonly known as: PROZAC TAKE 1 CAPSULE BY MOUTH EVERY DAY   fluticasone 50 MCG/ACT nasal spray Commonly known as: FLONASE Place 2 sprays into both nostrils daily.   hydrochlorothiazide 12.5 MG capsule Commonly known as: MICROZIDE TAKE 1 CAPSULE BY MOUTH EVERY DAY   Krill Oil 500 MG Caps Take 500 mg by mouth daily.   multivitamin with minerals Tabs tablet Take 2 tablets by mouth daily.   sildenafil 100 MG tablet Commonly known as: Viagra Take 1 tablet (100 mg total) by mouth as needed for erectile dysfunction.        Allergies:  Allergies  Allergen Reactions   Sulfa Antibiotics Anaphylaxis   Ace Inhibitors Other (See Comments)    Feels as though throat is closing up intermittently   No Known Allergies     Other reaction(s): MALE ERECTILE DISORDER   Sulfasalazine Other (See Comments)    Tongue swelled    Sulfonamide Derivatives Other (See Comments)    Tongue swelled   Sulfamethoxazole-Trimethoprim Hives and Rash    Family History: Family History  Problem Relation Age of Onset   Stroke Father  Hypertension Father    Hypertension Mother        GAB   Heart disease Mother        Angina   Hypertension Sister    Hypertension Sister    Hypertension Brother    Hypertension Brother    Colon cancer Neg Hx     Social History:  reports that he has never smoked. He has never used smokeless tobacco. He reports current alcohol use. He reports that he does not use drugs.  ROS: All other review of systems were reviewed and are negative except what is noted above in HPI  Physical Exam: BP 129/84   Pulse 76   Ht '5\' 11"'$  (1.803 m)   Wt 226 lb 6 oz (102.7 kg)   BMI 31.57 kg/m   Constitutional:  Alert and oriented, No acute  distress. HEENT: [place] AT, moist mucus membranes.  Trachea midline, no masses. Cardiovascular: No clubbing, cyanosis, or edema. Respiratory: Normal respiratory effort, no increased work of breathing. GI: Abdomen is soft, nontender, nondistended, no abdominal masses GU: No CVA tenderness. Circumcised phallus.  Lymph: No cervical or inguinal lymphadenopathy. Skin: No rashes, bruises or suspicious lesions. Neurologic: Grossly intact, no focal deficits, moving all 4 extremities. Psychiatric: Normal mood and affect.  Laboratory Data: Lab Results  Component Value Date   WBC 5.0 03/22/2020   HGB 15.3 03/22/2020   HCT 47.2 03/22/2020   MCV 91.7 03/22/2020   PLT 216 03/22/2020    Lab Results  Component Value Date   CREATININE 1.06 03/22/2020    Lab Results  Component Value Date   PSA 1.0 06/03/2019   PSA 1.2 07/22/2018   PSA 1.0 06/18/2016    Lab Results  Component Value Date   TESTOSTERONE 202 (L) 07/12/2020    Lab Results  Component Value Date   HGBA1C 5.2 07/11/2020    Urinalysis    Component Value Date/Time   COLORURINE YELLOW 07/28/2013 2028   APPEARANCEUR CLEAR 07/28/2013 2028   LABSPEC 1.015 07/28/2013 2028   PHURINE 6.0 07/28/2013 2028   GLUCOSEU NEGATIVE 07/28/2013 2028   HGBUR TRACE (A) 07/28/2013 2028   HGBUR negative 12/05/2008 1305   BILIRUBINUR NEGATIVE 07/28/2013 2028   KETONESUR NEGATIVE 07/28/2013 2028   PROTEINUR NEGATIVE 07/28/2013 2028   UROBILINOGEN 0.2 07/28/2013 2028   NITRITE NEGATIVE 07/28/2013 2028   LEUKOCYTESUR NEGATIVE 07/28/2013 2028    Lab Results  Component Value Date   BACTERIA RARE 07/28/2013    Pertinent Imaging:  No results found for this or any previous visit.  No results found for this or any previous visit.  No results found for this or any previous visit.  No results found for this or any previous visit.  No results found for this or any previous visit.  No results found for this or any previous visit.  No  results found for this or any previous visit.  No results found for this or any previous visit.   Assessment & Plan:    1. Low testosterone Testosterone labs today, will call with results - Urinalysis, Routine w reflex microscopic  2. Erectile dysfunction due to arterial insufficiency Continue sildenafil '100mg'$  prn   No follow-ups on file.  Nicolette Bang, MD  [facility] [place]

## 2020-09-05 NOTE — Patient Instructions (Signed)

## 2020-09-06 LAB — MICROSCOPIC EXAMINATION
Bacteria, UA: NONE SEEN
Renal Epithel, UA: NONE SEEN /hpf
WBC, UA: NONE SEEN /hpf (ref 0–5)

## 2020-09-06 LAB — URINALYSIS, ROUTINE W REFLEX MICROSCOPIC
Bilirubin, UA: NEGATIVE
Glucose, UA: NEGATIVE
Ketones, UA: NEGATIVE
Leukocytes,UA: NEGATIVE
Nitrite, UA: NEGATIVE
Protein,UA: NEGATIVE
Specific Gravity, UA: 1.025 (ref 1.005–1.030)
Urobilinogen, Ur: 0.2 mg/dL (ref 0.2–1.0)
pH, UA: 5.5 (ref 5.0–7.5)

## 2020-09-11 ENCOUNTER — Other Ambulatory Visit: Payer: Self-pay

## 2020-09-11 ENCOUNTER — Other Ambulatory Visit: Payer: No Typology Code available for payment source

## 2020-09-11 DIAGNOSIS — R7989 Other specified abnormal findings of blood chemistry: Secondary | ICD-10-CM

## 2020-09-14 ENCOUNTER — Encounter: Payer: Self-pay | Admitting: Emergency Medicine

## 2020-09-14 ENCOUNTER — Ambulatory Visit
Admission: EM | Admit: 2020-09-14 | Discharge: 2020-09-14 | Disposition: A | Discharge status: Home / Self Care | Payer: 59 | Attending: Emergency Medicine | Admitting: Emergency Medicine

## 2020-09-14 ENCOUNTER — Other Ambulatory Visit: Payer: Self-pay

## 2020-09-14 ENCOUNTER — Encounter: Payer: Self-pay | Admitting: Urology

## 2020-09-14 DIAGNOSIS — R21 Rash and other nonspecific skin eruption: Secondary | ICD-10-CM

## 2020-09-14 DIAGNOSIS — R1031 Right lower quadrant pain: Secondary | ICD-10-CM

## 2020-09-14 DIAGNOSIS — S20362A Insect bite (nonvenomous) of left front wall of thorax, initial encounter: Secondary | ICD-10-CM

## 2020-09-14 DIAGNOSIS — W57XXXA Bitten or stung by nonvenomous insect and other nonvenomous arthropods, initial encounter: Secondary | ICD-10-CM

## 2020-09-14 LAB — COMPREHENSIVE METABOLIC PANEL
ALT: 26 IU/L (ref 0–44)
AST: 25 IU/L (ref 0–40)
Albumin/Globulin Ratio: 1.4 (ref 1.2–2.2)
Albumin: 4.2 g/dL (ref 3.8–4.9)
Alkaline Phosphatase: 64 IU/L (ref 44–121)
BUN/Creatinine Ratio: 12 (ref 9–20)
BUN: 15 mg/dL (ref 6–24)
Bilirubin Total: 0.3 mg/dL (ref 0.0–1.2)
CO2: 22 mmol/L (ref 20–29)
Calcium: 9.3 mg/dL (ref 8.7–10.2)
Chloride: 101 mmol/L (ref 96–106)
Creatinine, Ser: 1.21 mg/dL (ref 0.76–1.27)
Globulin, Total: 2.9 g/dL (ref 1.5–4.5)
Glucose: 91 mg/dL (ref 65–99)
Potassium: 3.7 mmol/L (ref 3.5–5.2)
Sodium: 142 mmol/L (ref 134–144)
Total Protein: 7.1 g/dL (ref 6.0–8.5)
eGFR: 72 mL/min/{1.73_m2} (ref >59)

## 2020-09-14 LAB — CBC WITH DIFFERENTIAL
Basophils Absolute: 0.1 10*3/uL (ref 0.0–0.2)
Basos: 1 %
EOS (ABSOLUTE): 0.4 10*3/uL (ref 0.0–0.4)
Eos: 10 %
Hematocrit: 44.7 % (ref 37.5–51.0)
Hemoglobin: 15 g/dL (ref 13.0–17.7)
Immature Grans (Abs): 0 10*3/uL (ref 0.0–0.1)
Immature Granulocytes: 0 %
Lymphocytes Absolute: 1.4 10*3/uL (ref 0.7–3.1)
Lymphs: 36 %
MCH: 29.4 pg (ref 26.6–33.0)
MCHC: 33.6 g/dL (ref 31.5–35.7)
MCV: 88 fL (ref 79–97)
Monocytes Absolute: 0.3 10*3/uL (ref 0.1–0.9)
Monocytes: 7 %
Neutrophils Absolute: 1.8 10*3/uL (ref 1.4–7.0)
Neutrophils: 46 %
RBC: 5.11 x10E6/uL (ref 4.14–5.80)
RDW: 12.6 % (ref 11.6–15.4)
WBC: 3.9 10*3/uL (ref 3.4–10.8)

## 2020-09-14 LAB — ESTRADIOL: Estradiol: 33 pg/mL (ref 7.6–42.6)

## 2020-09-14 LAB — TESTOSTERONE,FREE AND TOTAL
Testosterone, Free: 6.8 pg/mL — ABNORMAL LOW (ref 7.2–24.0)
Testosterone: 201 ng/dL — ABNORMAL LOW (ref 264–916)

## 2020-09-14 MED ORDER — DOXYCYCLINE HYCLATE 100 MG PO CAPS: 100.0000 mg | ORAL_CAPSULE | Freq: Two times a day (BID) | ORAL | 0 refills | Status: DC

## 2020-09-14 NOTE — Discharge Instructions (Addendum)
Unable to rule out appendicitis or hernia (which may be cause of RLQ) in urgent care setting.  Offered patient further evaluation and management in the ED.  Patient declines at this time and would like to try outpatient therapy first.  Aware of the risk associated with this decision including missed diagnosis, organ damage, organ failure, and/or death.  Patient aware and in agreement.     Prescribed doxycycline for possible tick borne illness To prevent tick bites, wear long sleeves, long pants, and light colors. Use insect repellent. Follow the instructions on the bottle. If the tick is biting, do not try to remove it with heat, alcohol, petroleum jelly, or fingernail polish. Use tweezers, curved forceps, or a tick-removal tool to grasp the tick. Gently pull up until the tick lets go. Do not twist or jerk the tick. Do not squeeze or crush the tick. Return here or go to ER if you have any new or worsening symptoms (rash, nausea, vomiting, fever, chills, headache, fatigue, etc..Marland Kitchen

## 2020-09-14 NOTE — ED Triage Notes (Signed)
Pt presents today with c/o rash to BLE. He reports being bit by tick on 08/07/20 and he removed it. He now has rash to BLE and c/o of testicular pain and right sided abdominal pain. He is concerned he may have Lyme Disease. He called Dr Griffin Dakin office and was told to come to urgent care.

## 2020-09-14 NOTE — ED Provider Notes (Signed)
[place]   IN:[id number] 09/14/20 Arrival Time: 1205  CC: Rash  SUBJECTIVE:  Evan Jacobson is a 55 y.o. male complains of attached tick for one day that he removed on 08/07/20.  Reports rash to bilateral LEs x few days.  Tick bite to LT chest.  Removed after it had been attached for less than 24 hours.  Reports a sharp pain in RLQ over the past few days as well.  Some pain into LT testicle, now improved with ibuprofen and eating.  Denies fever, chills, nausea, vomiting, headache, dizziness, weakness, fatigue, rash.  Reports concern for lyme disease.    ROS: As per HPI.  All other pertinent ROS negative.     Past Medical History:  Diagnosis Date   Allergic rhinitis    Bipolar disorder ([place]) 08/25/2011   Chronic back pain    Concussion 2012   GERD (gastroesophageal reflux disease)    Groin pain, chronic, right 06/28/2014   Heart palpitations 06/18/2016   Hypertension    Persistent adjustment disorder 12/05/2015   Chronic adjustment disorder diagnosed by [facility] and counseling recomended   PTSD (post-traumatic stress disorder)    Sinusitis    Skin lump of leg, left 11/17/2017   Vertigo 01/08/2017   Past Surgical History:  Procedure Laterality Date   COLONOSCOPY N/A 08/12/2017   Procedure: COLONOSCOPY;  Surgeon: Rogene Houston, MD;  Location: [facility];  Service: Endoscopy;  Laterality: N/A;  730   NO PAST SURGERIES     Allergies  Allergen Reactions   Sulfa Antibiotics Anaphylaxis   Ace Inhibitors Other (See Comments)    Feels as though throat is closing up intermittently   No Known Allergies     Other reaction(s): MALE ERECTILE DISORDER   Sulfasalazine Other (See Comments)    Tongue swelled    Sulfonamide Derivatives Other (See Comments)    Tongue swelled   Sulfamethoxazole-Trimethoprim Hives and Rash   No current facility-administered medications on file prior to encounter.   Current Outpatient Medications on File Prior to Encounter  Medication Sig  Dispense Refill   amLODipine (NORVASC) 10 MG tablet Take 1 tablet (10 mg total) by mouth daily. 90 tablet 1   cetirizine (ZYRTEC) 10 MG tablet Take 10 mg by mouth daily.     EPINEPHrine 0.3 mg/0.3 mL IJ SOAJ injection Inject 0.3 mg into the muscle once.      FLUoxetine (PROZAC) 20 MG capsule TAKE 1 CAPSULE BY MOUTH EVERY DAY 90 capsule 1   fluticasone (FLONASE) 50 MCG/ACT nasal spray Place 2 sprays into both nostrils daily. 16 g 6   hydrochlorothiazide (MICROZIDE) 12.5 MG capsule TAKE 1 CAPSULE BY MOUTH EVERY DAY 90 capsule 1   Krill Oil 500 MG CAPS Take 500 mg by mouth daily.      Multiple Vitamin (MULTIVITAMIN WITH MINERALS) TABS tablet Take 2 tablets by mouth daily.      sildenafil (VIAGRA) 100 MG tablet Take 1 tablet (100 mg total) by mouth as needed for erectile dysfunction. 6 tablet 11   Social History   Socioeconomic History   Marital status: Married    Spouse name: Not on file   Number of children: 2   Years of education: Not on file   Highest education level: Not on file  Occupational History    Comment: Audio video tech    Employer: [employer]  Tobacco Use   Smoking status: Never   Smokeless tobacco: Never  Vaping Use   Vaping Use: Never used  Substance and Sexual Activity   Alcohol use: Yes    Comment: occ; six pack per week   Drug use: No   Sexual activity: Yes    Birth control/protection: None  Other Topics Concern   Not on file  Social History Narrative   Not on file   Social Determinants of Health   Financial Resource Strain: Not on file  Food Insecurity: Not on file  Transportation Needs: Not on file  Physical Activity: Not on file  Stress: Not on file  Social Connections: Not on file  Intimate Partner Violence: Not on file   Family History  Problem Relation Age of Onset   Stroke Father    Hypertension Father    Hypertension Mother        GAB   Heart disease Mother        Angina   Hypertension Sister    Hypertension Sister    Hypertension  Brother    Hypertension Brother    Colon cancer Neg Hx     OBJECTIVE: Vitals:   09/14/20 1231  BP: 133/88  Pulse: 73  Resp: 18  Temp: 98.3 F (36.8 C)  TempSrc: Oral  SpO2: 96%    General appearance: alert; no distress Head: NCAT Lungs: normal respiratory Extremities: no edema Skin: warm and dry; some hyperpigmentation to lower extremities, NTTP, no obvious drainage or bleeding Psychological: alert and cooperative; normal mood and affect  ASSESSMENT & PLAN:  1. Rash and nonspecific skin eruption   2. Tick bite of left front wall of thorax, initial encounter   3. Right lower quadrant abdominal pain     Meds ordered this encounter  Medications   doxycycline (VIBRAMYCIN) 100 MG capsule    Sig: Take 1 capsule (100 mg total) by mouth 2 (two) times daily.    Dispense:  20 capsule    Refill:  0    Order Specific Question:   Supervising Provider    Answer:   Raylene Everts [id number]   Unable to rule out appendicitis or hernia (which may be cause of RLQ) in urgent care setting.  Offered patient further evaluation and management in the ED.  Patient declines at this time and would like to try outpatient therapy first.  Aware of the risk associated with this decision including missed diagnosis, organ damage, organ failure, and/or death.  Patient aware and in agreement.     Prescribed doxycycline for possible tick borne illness To prevent tick bites, wear long sleeves, long pants, and light colors. Use insect repellent. Follow the instructions on the bottle. If the tick is biting, do not try to remove it with heat, alcohol, petroleum jelly, or fingernail polish. Use tweezers, curved forceps, or a tick-removal tool to grasp the tick. Gently pull up until the tick lets go. Do not twist or jerk the tick. Do not squeeze or crush the tick. Return here or go to ER if you have any new or worsening symptoms (rash, nausea, vomiting, fever, chills, headache, fatigue, etc...  Reviewed  expectations re: course of current medical issues. Questions answered. Outlined signs and symptoms indicating need for more acute intervention. Patient verbalized understanding. After Visit Summary given.    Lestine Box, PA-C 09/14/20 1308

## 2020-09-15 ENCOUNTER — Other Ambulatory Visit: Payer: Self-pay

## 2020-09-15 ENCOUNTER — Emergency Department (HOSPITAL_COMMUNITY): Payer: No Typology Code available for payment source

## 2020-09-15 ENCOUNTER — Encounter (HOSPITAL_COMMUNITY): Payer: Self-pay

## 2020-09-15 ENCOUNTER — Emergency Department (HOSPITAL_COMMUNITY)
Admission: EM | Admit: 2020-09-15 | Discharge: 2020-09-15 | Disposition: A | Discharge status: Home / Self Care | Payer: No Typology Code available for payment source | Attending: Emergency Medicine | Admitting: Emergency Medicine

## 2020-09-15 DIAGNOSIS — R1031 Right lower quadrant pain: Secondary | ICD-10-CM | POA: Diagnosis present

## 2020-09-15 DIAGNOSIS — N451 Epididymitis: Secondary | ICD-10-CM

## 2020-09-15 DIAGNOSIS — K219 Gastro-esophageal reflux disease without esophagitis: Secondary | ICD-10-CM | POA: Diagnosis not present

## 2020-09-15 DIAGNOSIS — Z79899 Other long term (current) drug therapy: Secondary | ICD-10-CM | POA: Insufficient documentation

## 2020-09-15 DIAGNOSIS — I10 Essential (primary) hypertension: Secondary | ICD-10-CM | POA: Diagnosis not present

## 2020-09-15 LAB — CBC
HCT: 43.3 % (ref 39.0–52.0)
Hemoglobin: 14.4 g/dL (ref 13.0–17.0)
MCH: 30.4 pg (ref 26.0–34.0)
MCHC: 33.3 g/dL (ref 30.0–36.0)
MCV: 91.4 fL (ref 80.0–100.0)
Platelets: 227 10*3/uL (ref 150–400)
RBC: 4.74 MIL/uL (ref 4.22–5.81)
RDW: 12.6 % (ref 11.5–15.5)
WBC: 4.3 10*3/uL (ref 4.0–10.5)
nRBC: 0 % (ref 0.0–0.2)

## 2020-09-15 LAB — COMPREHENSIVE METABOLIC PANEL
ALT: 23 U/L (ref 0–44)
AST: 22 U/L (ref 15–41)
Albumin: 4 g/dL (ref 3.5–5.0)
Alkaline Phosphatase: 57 U/L (ref 38–126)
Anion gap: 5 (ref 5–15)
BUN: 16 mg/dL (ref 6–20)
CO2: 28 mmol/L (ref 22–32)
Calcium: 8.7 mg/dL — ABNORMAL LOW (ref 8.9–10.3)
Chloride: 103 mmol/L (ref 98–111)
Creatinine, Ser: 1.32 mg/dL — ABNORMAL HIGH (ref 0.61–1.24)
GFR, Estimated: >60 mL/min (ref >60)
Glucose, Bld: 102 mg/dL — ABNORMAL HIGH (ref 70–99)
Potassium: 4 mmol/L (ref 3.5–5.1)
Sodium: 136 mmol/L (ref 135–145)
Total Bilirubin: 0.5 mg/dL (ref 0.3–1.2)
Total Protein: 7.7 g/dL (ref 6.5–8.1)

## 2020-09-15 LAB — LIPASE, BLOOD: Lipase: 26 U/L (ref 11–51)

## 2020-09-15 MED ORDER — HYDROMORPHONE HCL 1 MG/ML IJ SOLN
1.0000 mg | Freq: Once | INTRAMUSCULAR | Status: AC
Start: 2020-09-15 — End: 2020-09-15
  Administered 2020-09-15: 1 mg via INTRAVENOUS
  Filled 2020-09-15: qty 1

## 2020-09-15 MED ORDER — CEFTRIAXONE SODIUM 500 MG IJ SOLR
500.0000 mg | Freq: Once | INTRAMUSCULAR | Status: AC
  Administered 2020-09-15: 500 mg via INTRAMUSCULAR
  Filled 2020-09-15: qty 500

## 2020-09-15 MED ORDER — SODIUM CHLORIDE 0.9 % IV SOLN: INTRAVENOUS | Status: DC

## 2020-09-15 MED ORDER — IOHEXOL 300 MG/ML  SOLN
100.0000 mL | Freq: Once | INTRAMUSCULAR | Status: AC | PRN
  Administered 2020-09-15: 100 mL via INTRAVENOUS

## 2020-09-15 MED ORDER — ONDANSETRON HCL 4 MG/2ML IJ SOLN
4.0000 mg | Freq: Once | INTRAMUSCULAR | Status: AC
  Administered 2020-09-15: 4 mg via INTRAVENOUS
  Filled 2020-09-15: qty 2

## 2020-09-15 NOTE — Discharge Instructions (Addendum)
Follow-up with Dr. Alyson Ingles from urology as scheduled.  Continue to take your doxycycline as directed for the next 10 days.  Also can take Motrin as needed for the inflammation.  Return for any new or worse symptoms

## 2020-09-15 NOTE — ED Provider Notes (Signed)
[facility] EMERGENCY DEPARTMENT Provider Note   CSN: [id number] Arrival date & time: 09/15/20  1511     History Chief Complaint  Patient presents with   Abdominal Pain    Evan Jacobson is a 55 y.o. male.  Patient seen at urgent care yesterday.  Started on doxycycline with concerns for tick bite.  Patient with a complaint of 2 days of sort of right lower quadrant abdominal pain and right testicular pain.  Right testicular pain is gotten worse.  Is tender to touch.  Denies any fevers.  Denies any similar symptoms.  Is actually followed by Dr. Alyson Ingles for urology due to low testosterone.  Has follow-up with him at the end of the month.      Past Medical History:  Diagnosis Date   Allergic rhinitis    Bipolar disorder ([place]) 08/25/2011   Chronic back pain    Concussion 2012   GERD (gastroesophageal reflux disease)    Groin pain, chronic, right 06/28/2014   Heart palpitations 06/18/2016   Hypertension    Persistent adjustment disorder 12/05/2015   Chronic adjustment disorder diagnosed by [facility] and counseling recomended   PTSD (post-traumatic stress disorder)    Sinusitis    Skin lump of leg, left 11/17/2017   Vertigo 01/08/2017    Patient Active Problem List   Diagnosis Date Noted   Snoring 07/18/2020   Fatigue 07/18/2020   Low testosterone in male 07/18/2020   GAD (generalized anxiety disorder) 05/28/2020   Degeneration of intervertebral disc 05/03/2020   Major depressive disorder, recurrent episode, moderate ([place]) 05/03/2020   Pain in joint, lower leg 05/03/2020   Low back pain 03/10/2020   Light-headed feeling 02/08/2020   ETD (Eustachian tube dysfunction), bilateral 01/11/2020   Anxiety 12/13/2019   Constipation 11/28/2019   Tachycardia 08/17/2019   Wrist pain, right 08/17/2019   Vitamin D deficiency 06/02/2019   Tinea versicolor 04/05/2019   Overweight (BMI 25.0-29.9) 10/09/2018   Seasonal and perennial allergic rhinitis 06/30/2017   Seizure ([place]) 03/24/2017    Headache disorder 02/23/2017   Syncope 02/06/2017   Dizziness 01/08/2017   Issue of medical certificate for disability examination 10/25/2014   Obesity (BMI 30.0-34.9) 05/20/2014   Back pain with left-sided radiculopathy 01/16/2014   Palpitations 01/16/2014   PTSD (post-traumatic stress disorder) 07/29/2013   Depression 12/13/2010   Rhinitis, chronic 12/05/2008   Essential hypertension 06/19/2008   ERECTILE DYSFUNCTION 11/07/2007   GERD 08/13/2007    Past Surgical History:  Procedure Laterality Date   COLONOSCOPY N/A 08/12/2017   Procedure: COLONOSCOPY;  Surgeon: Rogene Houston, MD;  Location: [facility];  Service: Endoscopy;  Laterality: N/A;  92   NO PAST SURGERIES         Family History  Problem Relation Age of Onset   Stroke Father    Hypertension Father    Hypertension Mother        GAB   Heart disease Mother        Angina   Hypertension Sister    Hypertension Sister    Hypertension Brother    Hypertension Brother    Colon cancer Neg Hx     Social History   Tobacco Use   Smoking status: Never   Smokeless tobacco: Never  Vaping Use   Vaping Use: Never used  Substance Use Topics   Alcohol use: Yes    Comment: occ; six pack per week   Drug use: No    Home Medications Prior to Admission medications  Medication Sig Start Date End Date Taking? Authorizing Provider  amLODipine (NORVASC) 10 MG tablet Take 1 tablet (10 mg total) by mouth daily. 07/18/20  Yes Fayrene Helper, MD  cetirizine (ZYRTEC) 10 MG tablet Take 10 mg by mouth daily.   Yes [provider]  doxycycline (VIBRAMYCIN) 100 MG capsule Take 1 capsule (100 mg total) by mouth 2 (two) times daily. 09/14/20  Yes Wurst, [place], PA-C  EPINEPHrine 0.3 mg/0.3 mL IJ SOAJ injection Inject 0.3 mg into the muscle once.  07/20/17  Yes [provider]  fluticasone (FLONASE) 50 MCG/ACT nasal spray Place 2 sprays into both nostrils daily. 06/05/20  Yes Lindell Spar, MD   hydrochlorothiazide (MICROZIDE) 12.5 MG capsule TAKE 1 CAPSULE BY MOUTH EVERY DAY 07/18/20  Yes Fayrene Helper, MD  Krill Oil 500 MG CAPS Take 500 mg by mouth daily.    Yes [provider]  Multiple Vitamin (MULTIVITAMIN WITH MINERALS) TABS tablet Take 2 tablets by mouth daily.    Yes [provider]  sildenafil (VIAGRA) 100 MG tablet Take 1 tablet (100 mg total) by mouth as needed for erectile dysfunction. 10/13/19  Yes Lindell Spar, MD  FLUoxetine (PROZAC) 20 MG capsule TAKE 1 CAPSULE BY MOUTH EVERY DAY Patient not taking: No sig reported 07/18/20   Fayrene Helper, MD    Allergies    Sulfa antibiotics, Ace inhibitors, No known allergies, Sulfasalazine, Sulfonamide derivatives, and Sulfamethoxazole-trimethoprim  Review of Systems   Review of Systems  Constitutional:  Negative for chills and fever.  HENT:  Negative for ear pain and sore throat.   Eyes:  Negative for pain and visual disturbance.  Respiratory:  Negative for cough and shortness of breath.   Cardiovascular:  Negative for chest pain and palpitations.  Gastrointestinal:  Positive for abdominal pain. Negative for vomiting.  Genitourinary:  Positive for scrotal swelling and testicular pain. Negative for dysuria and hematuria.  Musculoskeletal:  Negative for arthralgias and back pain.  Skin:  Negative for color change and rash.  Neurological:  Negative for seizures and syncope.  All other systems reviewed and are negative.  Physical Exam Updated Vital Signs BP 130/90   Pulse 78   Temp 97.9 F (36.6 C) (Oral)   Resp 17   Ht 1.803 m ('5\' 11"'$ )   Wt 99.8 kg   SpO2 97%   BMI 30.68 kg/m   Physical Exam Vitals and nursing note reviewed.  Constitutional:      Appearance: He is well-developed.  HENT:     Head: Normocephalic and atraumatic.  Eyes:     Conjunctiva/sclera: Conjunctivae normal.  Cardiovascular:     Rate and Rhythm: Normal rate and regular rhythm.     Heart sounds: No murmur  heard. Pulmonary:     Effort: Pulmonary effort is normal. No respiratory distress.     Breath sounds: Normal breath sounds.  Abdominal:     Palpations: Abdomen is soft.     Tenderness: There is abdominal tenderness. There is guarding.     Hernia: No hernia is present.     Comments: Tenderness right lower quadrant.  Tenderness right groin without mass  Genitourinary:    Penis: Normal.      Comments: Welling to the right testicle with tenderness to palpation.  Left testicle normal.  No discharge.  No lesions.  Penis normal. Musculoskeletal:     Cervical back: Neck supple.  Skin:    General: Skin is warm and dry.     Capillary  Refill: Capillary refill takes less than 2 seconds.  Neurological:     General: No focal deficit present.     Mental Status: He is alert and oriented to person, place, and time.    ED Results / Procedures / Treatments   Labs (all labs ordered are listed, but only abnormal results are displayed) Labs Reviewed  COMPREHENSIVE METABOLIC PANEL - Abnormal; Notable for the following components:      Result Value   Glucose, Bld 102 (*)    Creatinine, Ser 1.32 (*)    Calcium 8.7 (*)    All other components within normal limits  LIPASE, BLOOD  CBC  URINALYSIS, ROUTINE W REFLEX MICROSCOPIC    EKG None  Radiology CT Abdomen Pelvis W Contrast  Result Date: 09/15/2020 CLINICAL DATA:  Lower abdominal pain radiating to testicles since last week EXAM: CT ABDOMEN AND PELVIS WITH CONTRAST TECHNIQUE: Multidetector CT imaging of the abdomen and pelvis was performed using the standard protocol following bolus administration of intravenous contrast. CONTRAST:  170m OMNIPAQUE IOHEXOL 300 MG/ML  SOLN COMPARISON:  03/22/2020 FINDINGS: Lower chest: No acute pleural or parenchymal lung disease. Hepatobiliary: Subcentimeter cyst right lobe liver unchanged. Otherwise the liver and gallbladder are unremarkable. No biliary duct dilation. Pancreas: Unremarkable. No pancreatic ductal  dilatation or surrounding inflammatory changes. Spleen: Normal in size without focal abnormality. Adrenals/Urinary Tract: Adrenal glands are unremarkable. Kidneys are normal, without renal calculi, focal lesion, or hydronephrosis. Bladder is unremarkable. Stomach/Bowel: No bowel obstruction or ileus. There is a in normal caliber gas-filled appendix within the right lower quadrant. No bowel wall thickening or inflammatory change. Vascular/Lymphatic: No significant vascular findings are present. No enlarged abdominal or pelvic lymph nodes. Reproductive: Prostate is unremarkable. Other: No free fluid or free gas.  No abdominal wall hernia. Musculoskeletal: No acute or destructive bony lesions. Reconstructed images demonstrate no additional findings. IMPRESSION: 1. No acute intra-abdominal or intrapelvic process. Normal appendix right lower quadrant. Electronically Signed   By: MRanda NgoM.D.   On: 09/15/2020 21:03   [place]CROTUM W/DOPPLER  Result Date: 09/15/2020 CLINICAL DATA:  Right inguinal and right testicular pain, enlarged right hemiscrotum EXAM: SCROTAL ULTRASOUND DOPPLER ULTRASOUND OF THE TESTICLES TECHNIQUE: Complete ultrasound examination of the testicles, epididymis, and other scrotal structures was performed. Color and spectral Doppler ultrasound were also utilized to evaluate blood flow to the testicles. COMPARISON:  01/29/2012, 09/15/2020 FINDINGS: Right testicle Measurements: 2.5 x 3.8 x 2.9 cm. No mass or microlithiasis visualized. Coarse benign capsular calcifications unchanged. Left testicle Measurements: 2.7 x 3.9 x 2.3 cm. No mass or microlithiasis visualized. Coarse benign capsular calcifications unchanged. Right epididymis: Enlarged and heterogeneous, with increased vascularity most consistent with epididymitis. Left epididymis:  Normal in size and appearance. Hydrocele:  Small bilateral hydroceles. Varicocele:  Small right varicocele. Pulsed Doppler interrogation of both testes  demonstrates normal low resistance arterial and venous waveforms bilaterally. IMPRESSION: 1. Enlarged heterogeneous right epididymis, with increased vascularity, most consistent with epididymitis. 2. Small bilateral hydroceles. 3. Small right varicocele. 4. Stable unremarkable appearance of the testes. Electronically Signed   By: MRanda NgoM.D.   On: 09/15/2020 21:30    Procedures Procedures   Medications Ordered in ED Medications  0.9 %  sodium chloride infusion ( Intravenous New Bag/Given 09/15/20 2037)  cefTRIAXone (ROCEPHIN) injection 500 mg (has no administration in time range)  HYDROmorphone (DILAUDID) injection 1 mg (1 mg Intravenous Given 09/15/20 2038)  ondansetron (ZOFRAN) injection 4 mg (4 mg Intravenous Given 09/15/20 2038)  iohexol (OMNIPAQUE) 300  MG/ML solution 100 mL (100 mLs Intravenous Contrast Given 09/15/20 2046)    ED Course  I have reviewed the triage vital signs and the nursing notes.  Pertinent labs & imaging results that were available during my care of the patient were reviewed by me and considered in my medical decision making (see chart for details).    MDM Rules/Calculators/A&P                           Work-up's CT scan abdomen due to the right lower quadrant tenderness to palpation with guarding negative for appendicitis.  Ultrasound consistent with right epididymitis.  No evidence of any torsion.  Patient started on doxycycline yesterday as a 10-day course we will continue that.  We will give a dose of Rocephin here.  Labs without significant abnormalities.  No leukocytosis.  Slight elevation in creatinine at 1.3.  GFR normal.  She already has follow-up arranged with urology. Final Clinical Impression(s) / ED Diagnoses Final diagnoses:  Epididymitis, right    Rx / DC Orders ED Discharge Orders     None        Fredia Sorrow, MD 09/15/20 2310

## 2020-09-15 NOTE — ED Triage Notes (Addendum)
Pt. States they had lower abdominal pain that radiated to their testes since last Saturday. Pt. States they began to have lower quadrant abdominal pain 2 days ago. Pt. States it hurts to touch.

## 2020-09-17 ENCOUNTER — Encounter: Payer: Self-pay | Admitting: Family Medicine

## 2020-09-17 ENCOUNTER — Telehealth: Payer: Self-pay

## 2020-09-17 ENCOUNTER — Other Ambulatory Visit: Payer: Self-pay | Admitting: Family Medicine

## 2020-09-17 MED ORDER — CYCLOBENZAPRINE HCL 10 MG PO TABS: ORAL_TABLET | ORAL | 0 refills | Status: DC

## 2020-09-17 NOTE — Telephone Encounter (Signed)
Pt informed

## 2020-09-17 NOTE — Telephone Encounter (Signed)
We hope you get to feeling better soon!  Laretta Bolster, LPN

## 2020-09-17 NOTE — Telephone Encounter (Signed)
Patient called asked if Flexeril 10 mg be called into his pharmacy [facility]. Please contact patient when medicine is sent into his pharmacy.

## 2020-09-17 NOTE — Telephone Encounter (Signed)
I told pt Dr Moshe Cipro is not in the clinic this pm and he is asking to have Dr Posey Pronto to look at this.

## 2020-09-18 ENCOUNTER — Ambulatory Visit: Payer: No Typology Code available for payment source | Admitting: Family Medicine

## 2020-09-19 ENCOUNTER — Encounter: Payer: Self-pay | Admitting: Emergency Medicine

## 2020-09-19 ENCOUNTER — Ambulatory Visit
Admission: EM | Admit: 2020-09-19 | Discharge: 2020-09-19 | Disposition: A | Discharge status: Home / Self Care | Payer: No Typology Code available for payment source | Attending: Physician Assistant | Admitting: Physician Assistant

## 2020-09-19 ENCOUNTER — Other Ambulatory Visit: Payer: Self-pay

## 2020-09-19 DIAGNOSIS — M545 Low back pain, unspecified: Secondary | ICD-10-CM

## 2020-09-19 MED ORDER — METHYLPREDNISOLONE SODIUM SUCC 125 MG IJ SOLR
125.0000 mg | Freq: Once | INTRAMUSCULAR | Status: AC
  Administered 2020-09-19: 125 mg via INTRAMUSCULAR

## 2020-09-19 MED ORDER — KETOROLAC TROMETHAMINE 60 MG/2ML IM SOLN
60.0000 mg | Freq: Once | INTRAMUSCULAR | Status: AC
  Administered 2020-09-19: 60 mg via INTRAMUSCULAR

## 2020-09-19 NOTE — ED Triage Notes (Signed)
Chronic low back pain that flared up on Monday

## 2020-09-19 NOTE — Discharge Instructions (Addendum)
Return if any problems.

## 2020-09-24 NOTE — ED Provider Notes (Signed)
[facility]    CSN: [id number] Arrival date & time: 09/19/20  1326      History   Chief Complaint No chief complaint on file.   HPI Evan Jacobson is a 55 y.o. male.   The history is provided by the patient. No language interpreter was used.  Back Pain Location:  Lumbar spine Quality:  Aching Pain severity:  Severe Pain is:  Same all the time Duration:  1 week Timing:  Constant Chronicity:  New Worsened by:  Nothing Ineffective treatments:  None tried Pt reports he has a history of back pain  Pt requesting a shot of solumedrol and toradol.  Pt states he has had good. Relief in the past   Past Medical History:  Diagnosis Date   Allergic rhinitis    Bipolar disorder ([place]) 08/25/2011   Chronic back pain    Concussion 2012   GERD (gastroesophageal reflux disease)    Groin pain, chronic, right 06/28/2014   Heart palpitations 06/18/2016   Hypertension    Persistent adjustment disorder 12/05/2015   Chronic adjustment disorder diagnosed by [facility] and counseling recomended   PTSD (post-traumatic stress disorder)    Sinusitis    Skin lump of leg, left 11/17/2017   Vertigo 01/08/2017    Patient Active Problem List   Diagnosis Date Noted   Snoring 07/18/2020   Fatigue 07/18/2020   Low testosterone in male 07/18/2020   GAD (generalized anxiety disorder) 05/28/2020   Degeneration of intervertebral disc 05/03/2020   Major depressive disorder, recurrent episode, moderate ([place]) 05/03/2020   Pain in joint, lower leg 05/03/2020   Low back pain 03/10/2020   Light-headed feeling 02/08/2020   ETD (Eustachian tube dysfunction), bilateral 01/11/2020   Anxiety 12/13/2019   Constipation 11/28/2019   Tachycardia 08/17/2019   Wrist pain, right 08/17/2019   Vitamin D deficiency 06/02/2019   Tinea versicolor 04/05/2019   Overweight (BMI 25.0-29.9) 10/09/2018   Seasonal and perennial allergic rhinitis 06/30/2017   Seizure ([place]) 03/24/2017   Headache disorder 02/23/2017    Syncope 02/06/2017   Dizziness 01/08/2017   Issue of medical certificate for disability examination 10/25/2014   Obesity (BMI 30.0-34.9) 05/20/2014   Back pain with left-sided radiculopathy 01/16/2014   Palpitations 01/16/2014   PTSD (post-traumatic stress disorder) 07/29/2013   Depression 12/13/2010   Rhinitis, chronic 12/05/2008   Essential hypertension 06/19/2008   ERECTILE DYSFUNCTION 11/07/2007   GERD 08/13/2007    Past Surgical History:  Procedure Laterality Date   COLONOSCOPY N/A 08/12/2017   Procedure: COLONOSCOPY;  Surgeon: Rogene Houston, MD;  Location: [facility];  Service: Endoscopy;  Laterality: N/A;  730   NO PAST SURGERIES         Home Medications    Prior to Admission medications   Medication Sig Start Date End Date Taking? Authorizing Provider  amLODipine (NORVASC) 10 MG tablet Take 1 tablet (10 mg total) by mouth daily. 07/18/20   Fayrene Helper, MD  cetirizine (ZYRTEC) 10 MG tablet Take 10 mg by mouth daily.    [provider]  cyclobenzaprine (FLEXERIL) 10 MG tablet Take one tablet by mouth once daily at bedtime, as needed, for muscle spasm 09/17/20   Fayrene Helper, MD  doxycycline (VIBRAMYCIN) 100 MG capsule Take 1 capsule (100 mg total) by mouth 2 (two) times daily. 09/14/20   Wurst, [place], PA-C  EPINEPHrine 0.3 mg/0.3 mL IJ SOAJ injection Inject 0.3 mg into the muscle once.  07/20/17   [provider]  FLUoxetine (  PROZAC) 20 MG capsule TAKE 1 CAPSULE BY MOUTH EVERY DAY Patient not taking: No sig reported 07/18/20   Fayrene Helper, MD  fluticasone [facility]) 50 MCG/ACT nasal spray Place 2 sprays into both nostrils daily. 06/05/20   Lindell Spar, MD  hydrochlorothiazide (MICROZIDE) 12.5 MG capsule TAKE 1 CAPSULE BY MOUTH EVERY DAY 07/18/20   Fayrene Helper, MD  Krill Oil 500 MG CAPS Take 500 mg by mouth daily.     [provider]  Multiple Vitamin (MULTIVITAMIN WITH MINERALS) TABS tablet Take 2 tablets by  mouth daily.     [provider]  sildenafil (VIAGRA) 100 MG tablet Take 1 tablet (100 mg total) by mouth as needed for erectile dysfunction. 10/13/19   Lindell Spar, MD    Family History Family History  Problem Relation Age of Onset   Stroke Father    Hypertension Father    Hypertension Mother        GAB   Heart disease Mother        Angina   Hypertension Sister    Hypertension Sister    Hypertension Brother    Hypertension Brother    Colon cancer Neg Hx     Social History Social History   Tobacco Use   Smoking status: Never   Smokeless tobacco: Never  Vaping Use   Vaping Use: Never used  Substance Use Topics   Alcohol use: Yes    Comment: occ; six pack per week   Drug use: No     Allergies   Sulfa antibiotics, Ace inhibitors, No known allergies, Sulfasalazine, Sulfonamide derivatives, and Sulfamethoxazole-trimethoprim   Review of Systems Review of Systems  Musculoskeletal:  Positive for back pain.  All other systems reviewed and are negative.   Physical Exam Triage Vital Signs ED Triage Vitals [09/19/20 1336]  Enc Vitals Group     BP 121/82     Pulse Rate 92     Resp 19     Temp 98.1 F (36.7 C)     Temp Source Oral     SpO2 98 %     Weight      Height      Head Circumference      Peak Flow      Pain Score 10     Pain Loc      Pain Edu?      Excl. in [place]?    No data found.  Updated Vital Signs BP 121/82 (BP Location: Right Arm)   Pulse 92   Temp 98.1 F (36.7 C) (Oral)   Resp 19   SpO2 98%   Visual Acuity Right Eye Distance:   Left Eye Distance:   Bilateral Distance:    Right Eye Near:   Left Eye Near:    Bilateral Near:     Physical Exam Vitals and nursing note reviewed.  Constitutional:      Appearance: He is well-developed.  HENT:     Head: Normocephalic.  Cardiovascular:     Rate and Rhythm: Normal rate.  Pulmonary:     Effort: Pulmonary effort is normal.  Abdominal:     General: There is no distension.   Musculoskeletal:        General: Normal range of motion.     Cervical back: Normal range of motion.  Skin:    General: Skin is warm.  Neurological:     Mental Status: He is alert and oriented to person, place, and time.  UC Treatments / Results  Labs (all labs ordered are listed, but only abnormal results are displayed) Labs Reviewed - No data to display  EKG   Radiology No results found.  Procedures Procedures (including critical care time)  Medications Ordered in UC Medications  methylPREDNISolone sodium succinate (SOLU-MEDROL) 125 mg/2 mL injection 125 mg (125 mg Intramuscular Given 09/19/20 1429)  ketorolac (TORADOL) injection 60 mg (60 mg Intramuscular Given 09/19/20 1429)    Initial Impression / Assessment and Plan / UC Course  I have reviewed the triage vital signs and the nursing notes.  Pertinent labs & imaging results that were available during my care of the patient were reviewed by me and considered in my medical decision making (see chart for details).     MDM:  Pt given injection of toradol and solumedrol.  Pt advised to follow up with his MD as scheduled  Final Clinical Impressions(s) / UC Diagnoses   Final diagnoses:  Acute midline low back pain without sciatica     Discharge Instructions      Return if any problems.    ED Prescriptions   None    PDMP not reviewed this encounter. An After Visit Summary was printed and given to the patient.    Fransico Meadow, [place] 09/24/20 1458

## 2020-09-26 ENCOUNTER — Ambulatory Visit: Payer: No Typology Code available for payment source | Admitting: Family Medicine

## 2020-10-03 ENCOUNTER — Other Ambulatory Visit: Payer: Self-pay | Admitting: Family Medicine

## 2020-10-03 ENCOUNTER — Telehealth: Payer: Self-pay

## 2020-10-03 DIAGNOSIS — R7989 Other specified abnormal findings of blood chemistry: Secondary | ICD-10-CM

## 2020-10-03 MED ORDER — CLOMIPHENE CITRATE 50 MG PO TABS: 25.0000 mg | ORAL_TABLET | Freq: Every day | ORAL | Status: DC

## 2020-10-03 MED ORDER — CLOMIPHENE CITRATE 50 MG PO TABS: 25.0000 mg | ORAL_TABLET | Freq: Every day | ORAL | 5 refills | Status: DC

## 2020-10-03 NOTE — Telephone Encounter (Signed)
-----   Message from Cleon Gustin, MD sent at 10/02/2020  8:46 AM EDT ----- Testosterone labs were normal except testosterone was low at 200. He can start clomid '25mg'$  daily and see me in 3 months with repeat testosterone labs ----- Message ----- From: Iris Pert, LPN Sent: [phone]   4:13 PM EDT To: Cleon Gustin, MD  Please review

## 2020-10-03 NOTE — Telephone Encounter (Signed)
Rx sent. Patient called and made aware. 

## 2020-10-21 NOTE — Progress Notes (Signed)
New Patient Note  RE: [name] MRN: [id number] DOB: 08-Mar-1965 Date of Office Visit: 10/22/2020  Consult requested by: Evan Helper, MD Primary care provider: Fayrene Helper, MD  Chief Complaint: Allergy Testing (Patient in today to test for nickel due to reactions after dental implants. )  History of Present Illness: I had the pleasure of seeing Evan Jacobson for initial evaluation at the [facility] on 10/23/2020. He is a 55 y.o. male, who is referred here by Evan Helper, MD for the evaluation of possible nickel allergy.  Patient started to have issues with lightheadedness and passing out for the past 3 years.  This has improved but still having lightheadedness about 60% of the day.  He also passed out twice the last few years while driving.   He is concerned about nickel allergy/sensitivity as these symptoms started to occur after he had 3 dental bridges placed which contain nickel.  He is having some localized irritations at the site of the dental bridges but otherwise no issues.   He was seen by cardiologist, neurologist and he had a heart monitor x 2, brain scans which were normal per patient report.   No issues with watches, rings, bracelets, necklaces, belts, buttons on pants.   Prior joint replacements or metals in the body: none.  03/01/2020 MRI brain: "IMPRESSION: 1. No acute abnormality. 2. Paranasal sinus mucosal thickening."  Dentist name: Evan Jacobson in [place], [place]. Phone: [phone]  Assessment and Plan: Evan Jacobson is a 55 y.o. male with: Lightheadedness Experiencing lightheadedness and passing out for the last 3 years.  Concerned about nickel allergy/sensitivity as these episodes started happening after he got his 3 dental bridges. No issues with jewelry/watches/glasses. Apparently he was worked up by cardiologist and neurologist. He wants to get metal patches done to rule out metal allergy. Discussed with  patient that his symptoms are not typical of a metal sensitivity.  Patient is going out of town and will return for metal patch placement on a Monday in [place] with follow ups on Wednesday and Friday in the [place] office.  Will send final note to dentist as well.   Other allergic rhinitis Perennial rhinoconjunctivitis symptoms.  Take Zyrtec, Allegra and Flonase with some benefit.  Was on allergy immunotherapy briefly in the past. 2019 skin testing was positive to trees, weeds, grasses, indoor molds, outdoor molds, dust mites, cat and dog. See below for environmental control measures.  Start Singulair (montelukast) 10mg  daily at night. Cautioned that in some children/adults can experience behavioral changes including hyperactivity, agitation, depression, sleep disturbances and suicidal ideations. These side effects are rare, but if you notice them you should notify me and discontinue Singulair (montelukast). Use over the counter antihistamines such as Zyrtec (cetirizine), Claritin (loratadine), Allegra (fexofenadine), or Xyzal (levocetirizine) daily as needed. May take twice a day during allergy flares. May switch antihistamines every few months. Use Flonase (fluticasone) nasal spray 1 spray per nostril twice a day as needed for nasal congestion.  Nasal saline spray (i.e., Simply Saline) or nasal saline lavage (i.e., NeilMed) is recommended as needed and prior to medicated nasal sprays.  Return for Patch testing.  Meds ordered this encounter  Medications   montelukast (SINGULAIR) 10 MG tablet    Sig: Take 1 tablet (10 mg total) by mouth at bedtime.    Dispense:  30 tablet    Refill:  3    Lab Orders  No laboratory test(s) ordered today  Other allergy screening: Asthma: no Rhino conjunctivitis: yes Perennial rhino conjunctivitis and skin testing in 2019 was positive to trees, weeds, grasses, indoor molds, outdoor molds, dust mites, cat and dog. Patient tried zyrtec, allegra and  Flonase with some benefit. Patient was on allergy injections in the past.   Food allergy: no Hymenoptera allergy: no Urticaria: no Eczema:no History of recurrent infections suggestive of immunodeficency: no  Diagnostics: None.  Past Medical History: Patient Active Problem List   Diagnosis Date Noted   Lightheadedness 10/23/2020   Snoring 07/18/2020   Fatigue 07/18/2020   Low testosterone in male 07/18/2020   GAD (generalized anxiety disorder) 05/28/2020   Degeneration of intervertebral disc 05/03/2020   Major depressive disorder, recurrent episode, moderate ([place]) 05/03/2020   Pain in joint, lower leg 05/03/2020   Low back pain 03/10/2020   Light-headed feeling 02/08/2020   ETD (Eustachian tube dysfunction), bilateral 01/11/2020   Anxiety 12/13/2019   Constipation 11/28/2019   Tachycardia 08/17/2019   Wrist pain, right 08/17/2019   Vitamin D deficiency 06/02/2019   Tinea versicolor 04/05/2019   Overweight (BMI 25.0-29.9) 10/09/2018   Other allergic rhinitis 06/30/2017   Seizure ([place]) 03/24/2017   Headache disorder 02/23/2017   Syncope 02/06/2017   Dizziness 01/08/2017   Issue of medical certificate for disability examination 10/25/2014   Obesity (BMI 30.0-34.9) 05/20/2014   Back pain with left-sided radiculopathy 01/16/2014   Palpitations 01/16/2014   PTSD (post-traumatic stress disorder) 07/29/2013   Depression 12/13/2010   Rhinitis, chronic 12/05/2008   Essential hypertension 06/19/2008   ERECTILE DYSFUNCTION 11/07/2007   GERD 08/13/2007   Past Medical History:  Diagnosis Date   Allergic rhinitis    Bipolar disorder ([place]) 08/25/2011   Chronic back pain    Concussion 2012   GERD (gastroesophageal reflux disease)    Groin pain, chronic, right 06/28/2014   Heart palpitations 06/18/2016   Hypertension    Persistent adjustment disorder 12/05/2015   Ch[facility] disorder [facility] by [facility] and counseling recomended   PTSD (post-traumatic stress disorder)     Sinusitis    Skin lump of leg, left 11/17/2017   Vertigo 01/08/2017   Past Surgical History: Past Surgical History:  Procedure Laterality Date   COLONOSCOPY N/A 08/12/2017   Procedure: COLONOSCOPY;  Surgeon: Rogene Houston, MD;  Location: [facility];  Service: Endoscopy;  Laterality: N/A;  730   NO PAST SURGERIES     Medication List:  Current Outpatient Medications  Medication Sig Dispense Refill   amLODipine (NORVASC) 10 MG tablet Take 1 tablet (10 mg total) by mouth daily. 90 tablet 1   cetirizine (ZYRTEC) 10 MG tablet Take 10 mg by mouth daily.     clomiPHENE (CLOMID) 50 MG tablet Take 0.5 tablets (25 mg total) by mouth daily. 30 tablet 5   cyclobenzaprine (FLEXERIL) 10 MG tablet Take one tablet by mouth once daily at bedtime, as needed, for muscle spasm 20 tablet 0   EPINEPHrine 0.3 mg/0.3 mL IJ SOAJ injection Inject 0.3 mg into the muscle once.      fluticasone (FLONASE) 50 MCG/ACT nasal spray Place 2 sprays into both nostrils daily. 16 g 6   hydrochlorothiazide (MICROZIDE) 12.5 MG capsule TAKE 1 CAPSULE BY MOUTH EVERY DAY 90 capsule 1   Krill Oil 500 MG CAPS Take 500 mg by mouth daily.      montelukast (SINGULAIR) 10 MG tablet Take 1 tablet (10 mg total) by mouth at bedtime. 30 tablet 3   Multiple Vitamin (MULTIVITAMIN WITH MINERALS) TABS tablet  Take 2 tablets by mouth daily.      No current facility-administered medications for this visit.   Allergies: Allergies  Allergen Reactions   Sulfa Antibiotics Anaphylaxis   Ace Inhibitors Other (See Comments)    Feels as though throat is closing up intermittently   No Known Allergies     Other reaction(s): MALE ERECTILE DISORDER   Sulfasalazine Other (See Comments)    Tongue swelled    Sulfonamide Derivatives Other (See Comments)    Tongue swelled   Sulfamethoxazole-Trimethoprim Hives and Rash   Social History: Social History   Socioeconomic History   Marital status: Married    Spouse name: Not on file   Number of  children: 2   Years of education: Not on file   Highest education level: Not on file  Occupational History    Comment: Audio video tech    Employer: [employer]  Tobacco Use   Smoking status: Never   Smokeless tobacco: Never  Vaping Use   Vaping Use: Never used  Substance and Sexual Activity   Alcohol use: Yes    Comment: occ; six pack per week   Drug use: No   Sexual activity: Yes    Birth control/protection: None  Other Topics Concern   Not on file  Social History Narrative   Not on file   Social Determinants of Health   Financial Resource Strain: Not on file  Food Insecurity: Not on file  Transportation Needs: Not on file  Physical Activity: Not on file  Stress: Not on file  Social Connections: Not on file   Lives in a 54 year old house. Smoking: denies Occupation: not employed  Environmental HistoryFreight forwarder in the house: no Charity fundraiser in the family room: no Carpet in the bedroom: yes Heating: heat pump Cooling: heat pump Pet: no  Family History: Family History  Problem Relation Age of Onset   Stroke Father    Hypertension Father    Hypertension Mother        GAB   Heart disease Mother        Angina   Hypertension Sister    Hypertension Sister    Hypertension Brother    Hypertension Brother    Colon cancer Neg Hx    Problem                               Relation Asthma                                   No  Eczema                                No  Food allergy                          No  Allergic rhino conjunctivitis     No   Review of Systems  Constitutional:  Negative for appetite change, chills, fever and unexpected weight change.  HENT:  Positive for congestion, rhinorrhea and sneezing.   Eyes:  Negative for itching.  Respiratory:  Negative for cough, chest tightness, shortness of breath and wheezing.   Cardiovascular:  Negative for chest pain.  Gastrointestinal:  Negative for abdominal pain.  Genitourinary:  Negative for difficulty  urinating.  Skin:  Negative for rash.  Allergic/Immunologic: Positive for environmental allergies.  Neurological:  Positive for dizziness and light-headedness. Negative for headaches.   Objective: BP 130/90   Pulse (!) 171   Temp 98.6 F (37 C) (Temporal)   Resp 16   Ht 5\' 11"  (1.803 m)   Wt 224 lb 9.6 oz (101.9 kg)   SpO2 97%   BMI 31.33 kg/m  Body mass index is 31.33 kg/m. Physical Exam Vitals and nursing note reviewed.  Constitutional:      Appearance: Normal appearance. He is well-developed.  HENT:     Head: Normocephalic and atraumatic.     Right Ear: Tympanic membrane and external ear normal.     Left Ear: Tympanic membrane and external ear normal.     Nose: Congestion (on left side) present.     Mouth/Throat:     Mouth: Mucous membranes are moist.     Pharynx: Oropharynx is clear.  Eyes:     Conjunctiva/sclera: Conjunctivae normal.  Cardiovascular:     Rate and Rhythm: Normal rate and regular rhythm.     Heart sounds: Normal heart sounds. No murmur heard.   No friction rub. No gallop.  Pulmonary:     Effort: Pulmonary effort is normal.     Breath sounds: Normal breath sounds. No wheezing, rhonchi or rales.  Musculoskeletal:     Cervical back: Neck supple.  Skin:    General: Skin is warm.     Findings: No rash.  Neurological:     Mental Status: He is alert and oriented to person, place, and time.  Psychiatric:        Behavior: Behavior normal.  The plan was reviewed with the patient/family, and all questions/concerned were addressed.  It was my pleasure to see Evan Jacobson today and participate in his care. Please feel free to contact me with any questions or concerns.  Sincerely,  Rexene Alberts, DO Allergy & Immunology  [facility] office: [place] office: [phone]

## 2020-10-22 ENCOUNTER — Other Ambulatory Visit: Payer: Self-pay

## 2020-10-22 ENCOUNTER — Ambulatory Visit (INDEPENDENT_AMBULATORY_CARE_PROVIDER_SITE_OTHER): Payer: 59 | Admitting: Allergy

## 2020-10-22 ENCOUNTER — Encounter: Payer: Self-pay | Admitting: Allergy

## 2020-10-22 VITALS — BP 130/90 | HR 171 | Temp 98.6°F | Resp 16 | Ht 71.0 in | Wt 224.6 lb

## 2020-10-22 DIAGNOSIS — J3089 Other allergic rhinitis: Secondary | ICD-10-CM | POA: Diagnosis not present

## 2020-10-22 DIAGNOSIS — R42 Dizziness and giddiness: Secondary | ICD-10-CM | POA: Diagnosis not present

## 2020-10-22 MED ORDER — MONTELUKAST SODIUM 10 MG PO TABS
10.0000 mg | ORAL_TABLET | Freq: Every day | ORAL | 3 refills | Status: DC
Start: 2020-10-22 — End: 2021-04-12

## 2020-10-22 NOTE — Patient Instructions (Addendum)
Typically metal allegies do not cause lightheadedness and passing out.   We can still do the metal patch testing to rule things out.   Patches are best placed on Monday with return to office on Wednesday and Friday of same week for readings.  Patches once placed should not get wet.  You do not have to stop any medications for patch testing but should not be on oral prednisone. You can schedule a patch testing visit when convenient for your schedule.    Monday visit - in [place]. Wednesday and Firday - in [place]  Environmental allergies: 2019 skin testing was positive to trees, weeds, grasses, indoor molds, outdoor molds, dust mites, cat and dog. See below for environmental control measures.  Start Singulair (montelukast) 10mg  daily at night. Cautioned that in some children/adults can experience behavioral changes including hyperactivity, agitation, depression, sleep disturbances and suicidal ideations. These side effects are rare, but if you notice them you should notify me and discontinue Singulair (montelukast).  Use over the counter antihistamines such as Zyrtec (cetirizine), Claritin (loratadine), Allegra (fexofenadine), or Xyzal (levocetirizine) daily as needed. May take twice a day during allergy flares. May switch antihistamines every few months. Use Flonase (fluticasone) nasal spray 1 spray per nostril twice a day as needed for nasal congestion.  Nasal saline spray (i.e., Simply Saline) or nasal saline lavage (i.e., NeilMed) is recommended as needed and prior to medicated nasal sprays.  Follow up for metal patch testing.  Reducing Pollen Exposure Pollen seasons: trees (spring), grass (summer) and ragweed/weeds (fall). Keep windows closed in your home and car to lower pollen exposure.  Install air conditioning in the bedroom and throughout the house if possible.  Avoid going out in dry windy days - especially early morning. Pollen counts are highest between 5 - 10 AM and on  dry, hot and windy days.  Save outside activities for late afternoon or after a heavy rain, when pollen levels are lower.  Avoid mowing of grass if you have grass pollen allergy. Be aware that pollen can also be transported indoors on people and pets.  Dry your clothes in an automatic dryer rather than hanging them outside where they might collect pollen.  Rinse hair and eyes before bedtime. Mold Control Mold and fungi can grow on a variety of surfaces provided certain temperature and moisture conditions exist.  Outdoor molds grow on plants, decaying vegetation and soil. The major outdoor mold, Alternaria and Cladosporium, are found in very high numbers during hot and dry conditions. Generally, a late summer - fall peak is seen for common outdoor fungal spores. Rain will temporarily lower outdoor mold spore count, but counts rise rapidly when the rainy period ends. The most important indoor molds are Aspergillus and Penicillium. Dark, humid and poorly ventilated basements are ideal sites for mold growth. The next most common sites of mold growth are the bathroom and the kitchen. Outdoor (Seasonal) Mold Control Use air conditioning and keep windows closed. Avoid exposure to decaying vegetation. Avoid leaf raking. Avoid grain handling. Consider wearing a face mask if working in moldy areas.  Indoor (Perennial) Mold Control  Maintain humidity below 50%. Get rid of mold growth on hard surfaces with water, detergent and, if necessary, 5% bleach (do not mix with other cleaners). Then dry the area completely. If mold covers an area more than 10 square feet, consider hiring an indoor environmental professional. For clothing, washing with soap and water is best. If moldy items cannot be cleaned and dried, throw them  away. Remove sources e.g. contaminated carpets. Repair and seal leaking roofs or pipes. Using dehumidifiers in damp basements may be helpful, but empty the water and clean units regularly to  prevent mildew from forming. All rooms, especially basements, bathrooms and kitchens, require ventilation and cleaning to deter mold and mildew growth. Avoid carpeting on concrete or damp floors, and storing items in damp areas. Control of House Dust Mite Allergen Dust mite allergens are a common trigger of allergy and asthma symptoms. While they can be found throughout the house, these microscopic creatures thrive in warm, humid environments such as bedding, upholstered furniture and carpeting. Because so much time is spent in the bedroom, it is essential to reduce mite levels there.  Encase pillows, mattresses, and box springs in special allergen-proof fabric covers or airtight, zippered plastic covers.  Bedding should be washed weekly in hot water (130 F) and dried in a hot dryer. Allergen-proof covers are available for comforters and pillows that can't be regularly washed.  Wash the allergy-proof covers every few months. Minimize clutter in the bedroom. Keep pets out of the bedroom.  Keep humidity less than 50% by using a dehumidifier or air conditioning. You can buy a humidity measuring device called a hygrometer to monitor this.  If possible, replace carpets with hardwood, linoleum, or washable area rugs. If that's not possible, vacuum frequently with a vacuum that has a HEPA filter. Remove all upholstered furniture and non-washable window drapes from the bedroom. Remove all non-washable stuffed toys from the bedroom.  Wash stuffed toys weekly. Pet Allergen Avoidance: Contrary to popular opinion, there are no "hypoallergenic" breeds of dogs or cats. That is because people are not allergic to an animal's hair, but to an allergen found in the animal's saliva, dander (dead skin flakes) or urine. Pet allergy symptoms typically occur within minutes. For some people, symptoms can build up and become most severe 8 to 12 hours after contact with the animal. People with severe allergies can experience  reactions in public places if dander has been transported on the pet owners' clothing. Keeping an animal outdoors is only a partial solution, since homes with pets in the yard still have higher concentrations of animal allergens. Before getting a pet, ask your allergist to determine if you are allergic to animals. If your pet is already considered part of your family, try to minimize contact and keep the pet out of the bedroom and other rooms where you spend a great deal of time. As with dust mites, vacuum carpets often or replace carpet with a hardwood floor, tile or linoleum. High-efficiency particulate air (HEPA) cleaners can reduce allergen levels over time. While dander and saliva are the source of cat and dog allergens, urine is the source of allergens from rabbits, hamsters, mice and [place] pigs; so ask a non-allergic family member to clean the animal's cage. If you have a pet allergy, talk to your allergist about the potential for allergy immunotherapy (allergy shots). This strategy can often provide long-term relief.

## 2020-10-23 DIAGNOSIS — R42 Dizziness and giddiness: Secondary | ICD-10-CM | POA: Insufficient documentation

## 2020-10-23 NOTE — Assessment & Plan Note (Signed)
Perennial rhinoconjunctivitis symptoms.  Take Zyrtec, Allegra and Flonase with some benefit.  Was on allergy immunotherapy briefly in the past. 2019 skin testing was positive to trees, weeds, grasses, indoor molds, outdoor molds, dust mites, cat and dog. . See below for environmental control measures.   Start Singulair (montelukast) 10mg  daily at night.  Cautioned that in some children/adults can experience behavioral changes including hyperactivity, agitation, depression, sleep disturbances and suicidal ideations. These side effects are rare, but if you notice them you should notify me and discontinue Singulair (montelukast). . Use over the counter antihistamines such as Zyrtec (cetirizine), Claritin (loratadine), Allegra (fexofenadine), or Xyzal (levocetirizine) daily as needed. May take twice a day during allergy flares. May switch antihistamines every few months. . Use Flonase (fluticasone) nasal spray 1 spray per nostril twice a day as needed for nasal congestion.  . Nasal saline spray (i.e., Simply Saline) or nasal saline lavage (i.e., NeilMed) is recommended as needed and prior to medicated nasal sprays.

## 2020-10-23 NOTE — Progress Notes (Signed)
Notes have been faxed to their office.

## 2020-10-23 NOTE — Assessment & Plan Note (Signed)
Experiencing lightheadedness and passing out for the last 3 years.  Concerned about nickel allergy/sensitivity as these episodes started happening after he got his 3 dental bridges. No issues with jewelry/watches/glasses. Apparently he was worked up by cardiologist and neurologist. He wants to get metal patches done to rule out metal allergy.  Discussed with patient that his symptoms are not typical of a metal sensitivity.   Patient is going out of town and will return for metal patch placement on a Monday in [place] with follow ups on Wednesday and Friday in the [place] office.   Will send final note to dentist as well.

## 2020-10-23 NOTE — Progress Notes (Signed)
Its okay. I will call them and tell them to disregard this note.  Just let me know when your ready to send.  Thanks

## 2020-10-24 NOTE — Progress Notes (Signed)
Completed.    Thanks

## 2020-10-28 NOTE — Progress Notes (Deleted)
   Follow Up Note  RE: Evan Jacobson MRN: [id number] DOB: 06/22/1965 Date of Office Visit: 10/29/2020  Referring provider: Fayrene Helper, MD Primary care provider: Fayrene Helper, MD  History of Present Illness: I had the pleasure of seeing Evan Jacobson for a follow up visit at the [facility] [place] [facility] [place] on 10/28/2020. He is a 55 y.o. male, who is being followed for concerned for metal sensitivity, allergic rhino conjunctivitis. Today he is here for patch test placement.  Lightheadedness Experiencing lightheadedness and passing out for the last 3 years.  Concerned about nickel allergy/sensitivity as these episodes started happening after he got his 3 dental bridges. No issues with jewelry/watches/glasses. Apparently he was worked up by cardiologist and neurologist. He wants to get metal patches done to rule out metal allergy. Discussed with patient that his symptoms are not typical of a metal sensitivity.  Patient is going out of town and will return for metal patch placement on a Monday in [place] with follow ups on Wednesday and Friday in the [place] office.  Will send final note to dentist as well.    Other allergic rhinitis Perennial rhinoconjunctivitis symptoms.  Take Zyrtec, Allegra and Flonase with some benefit.  Was on allergy immunotherapy briefly in the past. 2019 skin testing was positive to trees, weeds, grasses, indoor molds, outdoor molds, dust mites, cat and dog. See below for environmental control measures.  Start Singulair (montelukast) 10mg  daily at night. Cautioned that in some children/adults can experience behavioral changes including hyperactivity, agitation, depression, sleep disturbances and suicidal ideations. These side effects are rare, but if you notice them you should notify me and discontinue Singulair (montelukast). Use over the counter antihistamines such as Zyrtec (cetirizine), Claritin (loratadine), Allegra (fexofenadine), or Xyzal  (levocetirizine) daily as needed. May take twice a day during allergy flares. May switch antihistamines every few months. Use Flonase (fluticasone) nasal spray 1 spray per nostril twice a day as needed for nasal congestion.  Nasal saline spray (i.e., Simply Saline) or nasal saline lavage (i.e., NeilMed) is recommended as needed and prior to medicated nasal sprays.   Return for Patch testing.  Diagnostics: Metal Test patches placed.   Assessment and Plan: Evan Jacobson is a 55 y.o. male with: No problem-specific Assessment & Plan notes found for this encounter.  The patient was instructed regarding proper care of the patches for the next 48 hours. Do not get patches wet - avoid showering until the next visit. Do not engage in vigorous physical activity. Patient will follow up in 48 hours and 96 hours for patch readings.  It was my pleasure to see Evan Jacobson today and participate in his care. Please feel free to contact me with any questions or concerns.  Sincerely,  Rexene Alberts, DO Allergy & Immunology  [facility] office: [phone] [facility] office: [place] office: [phone]

## 2020-10-29 ENCOUNTER — Ambulatory Visit: Payer: Medicare Other | Admitting: Allergy

## 2020-10-29 DIAGNOSIS — L239 Allergic contact dermatitis, unspecified cause: Secondary | ICD-10-CM

## 2020-10-29 DIAGNOSIS — H1010 Acute atopic conjunctivitis, unspecified eye: Secondary | ICD-10-CM

## 2020-10-29 DIAGNOSIS — R42 Dizziness and giddiness: Secondary | ICD-10-CM

## 2020-10-31 ENCOUNTER — Encounter: Payer: Medicare Other | Admitting: Family

## 2020-11-02 ENCOUNTER — Encounter: Payer: Medicare Other | Admitting: Family

## 2020-11-08 ENCOUNTER — Other Ambulatory Visit: Payer: Self-pay | Admitting: Internal Medicine

## 2020-11-08 DIAGNOSIS — R42 Dizziness and giddiness: Secondary | ICD-10-CM

## 2020-11-19 ENCOUNTER — Ambulatory Visit: Payer: Self-pay | Admitting: Allergy

## 2020-11-26 ENCOUNTER — Other Ambulatory Visit: Payer: Self-pay | Admitting: Family Medicine

## 2020-11-26 ENCOUNTER — Other Ambulatory Visit: Payer: Self-pay | Admitting: Internal Medicine

## 2020-11-26 DIAGNOSIS — N529 Male erectile dysfunction, unspecified: Secondary | ICD-10-CM

## 2020-12-03 ENCOUNTER — Telehealth: Payer: Self-pay | Admitting: Family Medicine

## 2020-12-03 NOTE — Telephone Encounter (Signed)
PT CALLED IN FOR REFILLS  10 MG,  NOT SURE WHAT MED EXACTLY PLEASE CALL PT AND VERIFY

## 2020-12-04 ENCOUNTER — Other Ambulatory Visit: Payer: No Typology Code available for payment source

## 2020-12-04 ENCOUNTER — Other Ambulatory Visit: Payer: Self-pay

## 2020-12-04 DIAGNOSIS — I10 Essential (primary) hypertension: Secondary | ICD-10-CM

## 2020-12-04 DIAGNOSIS — R7989 Other specified abnormal findings of blood chemistry: Secondary | ICD-10-CM

## 2020-12-04 MED ORDER — CYCLOBENZAPRINE HCL 10 MG PO TABS: ORAL_TABLET | ORAL | 0 refills | Status: DC

## 2020-12-04 MED ORDER — AMLODIPINE BESYLATE 10 MG PO TABS: 10.0000 mg | ORAL_TABLET | Freq: Every day | ORAL | 1 refills | Status: DC

## 2020-12-04 NOTE — Telephone Encounter (Signed)
Amlodipine 10 and flexeril 10 refilled

## 2020-12-05 ENCOUNTER — Other Ambulatory Visit: Payer: No Typology Code available for payment source

## 2020-12-05 ENCOUNTER — Other Ambulatory Visit: Payer: Self-pay

## 2020-12-05 DIAGNOSIS — R7989 Other specified abnormal findings of blood chemistry: Secondary | ICD-10-CM

## 2020-12-06 LAB — CBC WITH DIFFERENTIAL/PLATELET
Basophils Absolute: 0.1 10*3/uL (ref 0.0–0.2)
Basos: 1 %
EOS (ABSOLUTE): 0.4 10*3/uL (ref 0.0–0.4)
Eos: 9 %
Hematocrit: 45.8 % (ref 37.5–51.0)
Hemoglobin: 14.8 g/dL (ref 13.0–17.7)
Immature Grans (Abs): 0 10*3/uL (ref 0.0–0.1)
Immature Granulocytes: 0 %
Lymphocytes Absolute: 2.1 10*3/uL (ref 0.7–3.1)
Lymphs: 44 %
MCH: 28.8 pg (ref 26.6–33.0)
MCHC: 32.3 g/dL (ref 31.5–35.7)
MCV: 89 fL (ref 79–97)
Monocytes Absolute: 0.3 10*3/uL (ref 0.1–0.9)
Monocytes: 6 %
Neutrophils Absolute: 2 10*3/uL (ref 1.4–7.0)
Neutrophils: 40 %
Platelets: 165 10*3/uL (ref 150–450)
RBC: 5.14 x10E6/uL (ref 4.14–5.80)
RDW: 12.5 % (ref 11.6–15.4)
WBC: 4.8 10*3/uL (ref 3.4–10.8)

## 2020-12-06 LAB — COMPREHENSIVE METABOLIC PANEL
ALT: 20 IU/L (ref 0–44)
AST: 24 IU/L (ref 0–40)
Albumin/Globulin Ratio: 1.3 (ref 1.2–2.2)
Albumin: 4 g/dL (ref 3.8–4.9)
Alkaline Phosphatase: 58 IU/L (ref 44–121)
BUN/Creatinine Ratio: 10 (ref 9–20)
BUN: 13 mg/dL (ref 6–24)
Bilirubin Total: 0.3 mg/dL (ref 0.0–1.2)
CO2: 25 mmol/L (ref 20–29)
Calcium: 9.4 mg/dL (ref 8.7–10.2)
Chloride: 104 mmol/L (ref 96–106)
Creatinine, Ser: 1.3 mg/dL — ABNORMAL HIGH (ref 0.76–1.27)
Globulin, Total: 3 g/dL (ref 1.5–4.5)
Glucose: 93 mg/dL (ref 70–99)
Potassium: 4.1 mmol/L (ref 3.5–5.2)
Sodium: 142 mmol/L (ref 134–144)
Total Protein: 7 g/dL (ref 6.0–8.5)
eGFR: 66 mL/min/{1.73_m2} (ref >59)

## 2020-12-06 LAB — ESTRADIOL: Estradiol: 61 pg/mL — ABNORMAL HIGH (ref 7.6–42.6)

## 2020-12-10 ENCOUNTER — Encounter: Payer: Self-pay | Admitting: Urology

## 2020-12-10 ENCOUNTER — Other Ambulatory Visit: Payer: Self-pay

## 2020-12-10 ENCOUNTER — Ambulatory Visit (INDEPENDENT_AMBULATORY_CARE_PROVIDER_SITE_OTHER): Payer: 59 | Admitting: Urology

## 2020-12-10 VITALS — BP 135/89 | HR 83 | Temp 98.9°F

## 2020-12-10 DIAGNOSIS — N5201 Erectile dysfunction due to arterial insufficiency: Secondary | ICD-10-CM | POA: Diagnosis not present

## 2020-12-10 DIAGNOSIS — R7989 Other specified abnormal findings of blood chemistry: Secondary | ICD-10-CM | POA: Diagnosis not present

## 2020-12-10 MED ORDER — ANASTROZOLE 1 MG PO TABS: 1.0000 mg | ORAL_TABLET | Freq: Every day | ORAL | 3 refills | Status: DC

## 2020-12-10 MED ORDER — CLOMIPHENE CITRATE 50 MG PO TABS: 25.0000 mg | ORAL_TABLET | Freq: Every day | ORAL | 3 refills | Status: DC

## 2020-12-10 MED ORDER — TADALAFIL 20 MG PO TABS: 20.0000 mg | ORAL_TABLET | Freq: Every day | ORAL | 5 refills | Status: DC | PRN

## 2020-12-10 NOTE — Progress Notes (Signed)
Urological Symptom Review  Patient is experiencing the following symptoms: Get up at night to urinate Leakage of urine Erection problems (male only)   Review of Systems  Gastrointestinal (upper)  : Negative for upper GI symptoms  Gastrointestinal (lower) : Negative for lower GI symptoms  Constitutional : Negative for symptoms  Skin: Negative for skin symptoms  Eyes: Negative for eye symptoms  Ear/Nose/Throat : Negative for Ear/Nose/Throat symptoms  Hematologic/Lymphatic: Negative for Hematologic/Lymphatic symptoms  Cardiovascular : Negative for cardiovascular symptoms  Respiratory : Negative for respiratory symptoms  Endocrine: Negative for endocrine symptoms  Musculoskeletal: Back pain  Neurological: Negative for neurological symptoms  Psychologic: Depression Anxiety

## 2020-12-10 NOTE — Patient Instructions (Signed)
Erectile Dysfunction °Erectile dysfunction (ED) is the inability to get or keep an erection in order to have sexual intercourse. ED is considered a symptom of an underlying disorder and is not considered a disease. ED may include: °Inability to get an erection. °Lack of enough hardness of the erection to allow penetration. °Loss of erection before sex is finished. °What are the causes? °This condition may be caused by: °Physical causes, such as: °Artery problems. This may include heart disease, high blood pressure, atherosclerosis, and diabetes. °Hormonal problems, such as low testosterone. °Obesity. °Nerve problems. This may include back or pelvic injuries, multiple sclerosis, Parkinson's disease, spinal cord injury, and stroke. °Certain medicines, such as: °Pain relievers. °Antidepressants. °Blood pressure medicines and water pills (diuretics). °Cancer medicines. °Antihistamines. °Muscle relaxants. °Lifestyle factors, such as: °Use of drugs such as marijuana, cocaine, or opioids. °Excessive use of alcohol. °Smoking. °Lack of physical activity or exercise. °Psychological causes, such as: °Anxiety or stress. °Sadness or depression. °Exhaustion. °Fear about sexual performance. °Guilt. °What are the signs or symptoms? °Symptoms of this condition include: °Inability to get an erection. °Lack of enough hardness of the erection to allow penetration. °Loss of the erection before sex is finished. °Sometimes having normal erections, but with frequent unsatisfactory episodes. °Low sexual satisfaction in either partner due to erection problems. °A curved penis occurring with erection. The curve may cause pain, or the penis may be too curved to allow for intercourse. °Never having nighttime or morning erections. °How is this diagnosed? °This condition is often diagnosed by: °Performing a physical exam to find other diseases or specific problems with the penis. °Asking you detailed questions about the problem. °Doing tests,  such as: °Blood tests to check for diabetes mellitus or high cholesterol, or to measure hormone levels. °Other tests to check for underlying health conditions. °An ultrasound exam to check for scarring. °A test to check blood flow to the penis. °Doing a sleep study at home to measure nighttime erections. °How is this treated? °This condition may be treated by: °Medicines, such as: °Medicine taken by mouth to help you achieve an erection (oral medicine). °Hormone replacement therapy to replace low testosterone levels. °Medicine that is injected into the penis. Your health care provider may instruct you how to give yourself these injections at home. °Medicine that is delivered with a short applicator tube. The tube is inserted into the opening at the tip of the penis, which is the opening of the urethra. A tiny pellet of medicine is put in the urethra. The pellet dissolves and enhances erectile function. This is also called MUSE (medicated urethral system for erections) therapy. °Vacuum pump. This is a pump with a ring on it. The pump and ring are placed on the penis and used to create pressure that helps the penis become erect. °Penile implant surgery. In this procedure, you may receive: °An inflatable implant. This consists of cylinders, a pump, and a reservoir. The cylinders can be inflated with a fluid that helps to create an erection, and they can be deflated after intercourse. °A semi-rigid implant. This consists of two silicone rubber rods. The rods provide some rigidity. They are also flexible, so the penis can both curve downward in its normal position and become straight for sexual intercourse. °Blood vessel surgery to improve blood flow to the penis. During this procedure, a blood vessel from a different part of the body is placed into the penis to allow blood to flow around (bypass) damaged or blocked blood vessels. °Lifestyle changes,   such as exercising more, losing weight, and quitting smoking. °Follow  these instructions at home: °Medicines ° °Take over-the-counter and prescription medicines only as told by your health care provider. Do not increase the dosage without first discussing it with your health care provider. °If you are using self-injections, do injections as directed by your health care provider. Make sure you avoid any veins that are on the surface of the penis. After giving an injection, apply pressure to the injection site for 5 minutes. °Talk to your health care provider about how to prevent headaches while taking ED medicines. These medicines may cause a sudden headache due to the increase in blood flow in your body. °General instructions °Exercise regularly, as directed by your health care provider. Work with your health care provider to lose weight, if needed. °Do not use any products that contain nicotine or tobacco. These products include cigarettes, chewing tobacco, and vaping devices, such as e-cigarettes. If you need help quitting, ask your health care provider. °Before using a vacuum pump, read the instructions that come with the pump and discuss any questions with your health care provider. °Keep all follow-up visits. This is important. °Contact a health care provider if: °You feel nauseous. °You are vomiting. °You get sudden headaches while taking ED medicines. °You have any concerns about your sexual health. °Get help right away if: °You are taking oral or injectable medicines and you have an erection that lasts longer than 4 hours. If your health care provider is unavailable, go to the nearest emergency room for evaluation. An erection that lasts much longer than 4 hours can result in permanent damage to your penis. °You have severe pain in your groin or abdomen. °You develop redness or severe swelling of your penis. °You have redness spreading at your groin or lower abdomen. °You are unable to urinate. °You experience chest pain or a rapid heartbeat (palpitations) after taking oral  medicines. °These symptoms may represent a serious problem that is an emergency. Do not wait to see if the symptoms will go away. Get medical help right away. Call your local emergency services (911 in the [place] Do not drive yourself to the hospital. °Summary °Erectile dysfunction (ED) is the inability to get or keep an erection during sexual intercourse. °This condition is diagnosed based on a physical exam, your symptoms, and tests to determine the cause. Treatment varies depending on the cause and may include medicines, hormone therapy, surgery, or a vacuum pump. °You may need follow-up visits to make sure that you are using your medicines or devices correctly. °Get help right away if you are taking or injecting medicines and you have an erection that lasts longer than 4 hours. °This information is not intended to replace advice given to you by your health care provider. Make sure you discuss any questions you have with your health care provider. °Document Revised: 04/11/2020 Document Reviewed: 04/11/2020 °Elsevier Patient Education © 2022 [employer] ° °

## 2020-12-10 NOTE — Progress Notes (Signed)
12/10/2020 3:36 PM   Evan Jacobson 1965-12-30 [id number]  Referring provider: Fayrene Helper, MD [address], [place],  [place] [address]  Followup hypogonadism and erectile dysfunction   HPI: Mr Evan Jacobson is a 55yo here for followup for hypogonadism and erectile dysfunction. He is currently on clomid 25mg  daily. He has good energy and good libido. No recent testosterone. Estradiol 61, Hgb 14.9, CMP creatinine 1.3. He is currently using tadalafil 20mg  prn for his erectile dysfunction which is failing to give him a firm erection and he cannot maintain his erection.    PMH: Past Medical History:  Diagnosis Date   Allergic rhinitis    Bipolar disorder ([place]) 08/25/2011   Chronic back pain    Concussion 2012   GERD (gastroesophageal reflux disease)    Groin pain, chronic, right 06/28/2014   Heart palpitations 06/18/2016   Hypertension    Persistent adjustment disorder 12/05/2015   Chronic adjustment disorder diagnosed by [facility] and counseling recomended   PTSD (post-traumatic stress disorder)    Sinusitis    Skin lump of leg, left 11/17/2017   Vertigo 01/08/2017    Surgical History: Past Surgical History:  Procedure Laterality Date   COLONOSCOPY N/A 08/12/2017   Procedure: COLONOSCOPY;  Surgeon: Rogene Houston, MD;  [facility];  Service: Endoscopy;  Laterality: N/A;  730   NO PAST SURGERIES      Home Medications:  Allergies as of 12/10/2020       Reactions   Sulfa Antibiotics Anaphylaxis   Ace Inhibitors Other (See Comments)   Feels as though throat is closing up intermittently   No Known Allergies    Other reaction(s): MALE ERECTILE DISORDER   Sulfasalazine Other (See Comments)   Tongue swelled   Sulfonamide Derivatives Other (See Comments)   Tongue swelled   Sulfamethoxazole-trimethoprim Hives, Rash        Medication List        Accurate as of December 10, 2020  3:36 PM. If you have any questions, ask your nurse or doctor.           amLODipine 10 MG tablet Commonly known as: NORVASC Take 1 tablet (10 mg total) by mouth daily.   cetirizine 10 MG tablet Commonly known as: ZYRTEC Take 10 mg by mouth daily.   clomiPHENE 50 MG tablet Commonly known as: CLOMID Take 0.5 tablets (25 mg total) by mouth daily.   cyclobenzaprine 10 MG tablet Commonly known as: FLEXERIL TAKE ONE TABLET BY MOUTH ONCE DAILY AT BEDTIME, AS NEEDED, FOR MUSCLE SPASM   EPINEPHrine 0.3 mg/0.3 mL Soaj injection Commonly known as: EPI-PEN Inject 0.3 mg into the muscle once.   fluticasone 50 MCG/ACT nasal spray Commonly known as: FLONASE Place 2 sprays into both nostrils daily.   hydrochlorothiazide 12.5 MG capsule Commonly known as: MICROZIDE TAKE 1 CAPSULE BY MOUTH EVERY DAY   Krill Oil 500 MG Caps Take 500 mg by mouth daily.   montelukast 10 MG tablet Commonly known as: Singulair Take 1 tablet (10 mg total) by mouth at bedtime.   multivitamin with minerals Tabs tablet Take 2 tablets by mouth daily.        Allergies:  Allergies  Allergen Reactions   Sulfa Antibiotics Anaphylaxis   Ace Inhibitors Other (See Comments)    Feels as though throat is closing up intermittently   No Known Allergies     Other reaction(s): MALE ERECTILE DISORDER   Sulfasalazine Other (See Comments)    Tongue  swelled    Sulfonamide Derivatives Other (See Comments)    Tongue swelled   Sulfamethoxazole-Trimethoprim Hives and Rash    Family History: Family History  Problem Relation Age of Onset   Stroke Father    Hypertension Father    Hypertension Mother        GAB   Heart disease Mother        Angina   Hypertension Sister    Hypertension Sister    Hypertension Brother    Hypertension Brother    Colon cancer Neg Hx     Social History:  reports that he has never smoked. He has never used smokeless tobacco. He reports current alcohol use. He reports that he does not use drugs.  ROS: All other review of systems were reviewed and  are negative except what is noted above in HPI  Physical Exam: BP 135/89   Pulse 83   Temp 98.9 F (37.2 C)   Constitutional:  Alert and oriented, No acute distress. HEENT: [place] AT, moist mucus membranes.  Trachea midline, no masses. Cardiovascular: No clubbing, cyanosis, or edema. Respiratory: Normal respiratory effort, no increased work of breathing. GI: Abdomen is soft, nontender, nondistended, no abdominal masses GU: No CVA tenderness.  Lymph: No cervical or inguinal lymphadenopathy. Skin: No rashes, bruises or suspicious lesions. Neurologic: Grossly intact, no focal deficits, moving all 4 extremities. Psychiatric: Normal mood and affect.  Laboratory Data: Lab Results  Component Value Date   WBC 4.8 12/05/2020   HGB 14.8 12/05/2020   HCT 45.8 12/05/2020   MCV 89 12/05/2020   PLT 165 12/05/2020    Lab Results  Component Value Date   CREATININE 1.30 (H) 12/05/2020    Lab Results  Component Value Date   PSA 1.0 06/03/2019   PSA 1.2 07/22/2018   PSA 1.0 06/18/2016    Lab Results  Component Value Date   TESTOSTERONE 201 (L) 09/11/2020    Lab Results  Component Value Date   HGBA1C 5.2 07/11/2020    Urinalysis    Component Value Date/Time   COLORURINE YELLOW 07/28/2013 2028   APPEARANCEUR Clear 09/05/2020 1520   LABSPEC 1.015 07/28/2013 2028   PHURINE 6.0 07/28/2013 2028   GLUCOSEU Negative 09/05/2020 1520   HGBUR TRACE (A) 07/28/2013 2028   HGBUR negative 12/05/2008 1305   BILIRUBINUR Negative 09/05/2020 [place] 07/28/2013 2028   PROTEINUR Negative 09/05/2020 1520   PROTEINUR NEGATIVE 07/28/2013 2028   UROBILINOGEN 0.2 07/28/2013 2028   NITRITE Negative 09/05/2020 1520   NITRITE NEGATIVE 07/28/2013 2028   LEUKOCYTESUR Negative 09/05/2020 1520    Lab Results  Component Value Date   LABMICR See below: 09/05/2020   WBCUA None seen 09/05/2020   LABEPIT 0-10 09/05/2020   BACTERIA None seen 09/05/2020    Pertinent Imaging:  No  results found for this or any previous visit.  No results found for this or any previous visit.  No results found for this or any previous visit.  No results found for this or any previous visit.  No results found for this or any previous visit.  No results found for this or any previous visit.  No results found for this or any previous visit.  No results found for this or any previous visit.   Assessment & Plan:    1. Low testosterone -Continue clomid 32md daily and add anastrazole 0.5mg  daily -RTC 3 months with testosterone  2. Erectile dysfunction due to arterial insufficiency -tadalafil 20mg  prn   No  follow-ups on file.  Nicolette Bang, MD  [facility] [place]

## 2020-12-16 LAB — SPECIMEN STATUS REPORT

## 2020-12-16 LAB — TESTOSTERONE,FREE AND TOTAL: Testosterone: 452 ng/dL (ref 264–916)

## 2020-12-23 ENCOUNTER — Other Ambulatory Visit: Payer: Self-pay

## 2020-12-23 ENCOUNTER — Ambulatory Visit
Admission: EM | Admit: 2020-12-23 | Discharge: 2020-12-23 | Disposition: A | Discharge status: Home / Self Care | Payer: No Typology Code available for payment source | Attending: Family Medicine | Admitting: Family Medicine

## 2020-12-23 DIAGNOSIS — J0100 Acute maxillary sinusitis, unspecified: Secondary | ICD-10-CM | POA: Diagnosis not present

## 2020-12-23 MED ORDER — AMOXICILLIN 875 MG PO TABS: 875.0000 mg | ORAL_TABLET | Freq: Two times a day (BID) | ORAL | 0 refills | Status: DC

## 2020-12-23 MED ORDER — PROMETHAZINE-DM 6.25-15 MG/5ML PO SYRP: 5.0000 mL | ORAL_SOLUTION | Freq: Four times a day (QID) | ORAL | 0 refills | Status: DC | PRN

## 2020-12-23 NOTE — ED Triage Notes (Signed)
Patient states he has a sinus infection that started on Friday and is progressing. He usually gets them this time of year.  Denies Fever  Denies Meds

## 2020-12-23 NOTE — ED Provider Notes (Signed)
[facility]    CSN: [id number] Arrival date & time: 12/23/20  0831      History   Chief Complaint No chief complaint on file.   HPI Evan Jacobson is a 55 y.o. male.   HPI Patient with a history of chronic allergic sinusitis related to year-round outdoor allergies presents today with 4 to 5 days of sinus pressure, nasal drainage, nasal burning with postnasal drainage cough.  Reports taking year-round antihistamine however medication has not alleviated symptoms that he is currently having.  Reports a sinus infection at least once annually current symptoms feel similar.  Denies any fever or known exposure to any positive for COVID or Flu  Past Medical History:  Diagnosis Date   Allergic rhinitis    Bipolar disorder ([place]) 08/25/2011   Chronic back pain    Concussion 2012   GERD (gastroesophageal reflux disease)    Groin pain, chronic, right 06/28/2014   Heart palpitations 06/18/2016   Hypertension    Persistent adjustment disorder 12/05/2015   Chronic adjustment disorder diagnosed by [facility] and counseling recomended   PTSD (post-traumatic stress disorder)    Sinusitis    Skin lump of leg, left 11/17/2017   Vertigo 01/08/2017    Patient Active Problem List   Diagnosis Date Noted   Lightheadedness 10/23/2020   Snoring 07/18/2020   Fatigue 07/18/2020   Low testosterone in male 07/18/2020   GAD (generalized anxiety disorder) 05/28/2020   Degeneration of intervertebral disc 05/03/2020   Major depressive disorder, recurrent episode, moderate ([place]) 05/03/2020   Pain in joint, lower leg 05/03/2020   Low back pain 03/10/2020   Light-headed feeling 02/08/2020   ETD (Eustachian tube dysfunction), bilateral 01/11/2020   Anxiety 12/13/2019   Constipation 11/28/2019   Tachycardia 08/17/2019   Wrist pain, right 08/17/2019   Vitamin D deficiency 06/02/2019   Tinea versicolor 04/05/2019   Overweight (BMI 25.0-29.9) 10/09/2018   Other allergic rhinitis 06/30/2017   Seizure  ([place]) 03/24/2017   Headache disorder 02/23/2017   Syncope 02/06/2017   Dizziness 01/08/2017   Issue of medical certificate for disability examination 10/25/2014   Obesity (BMI 30.0-34.9) 05/20/2014   Back pain with left-sided radiculopathy 01/16/2014   Palpitations 01/16/2014   PTSD (post-traumatic stress disorder) 07/29/2013   Depression 12/13/2010   Rhinitis, chronic 12/05/2008   Essential hypertension 06/19/2008   ERECTILE DYSFUNCTION 11/07/2007   GERD 08/13/2007    Past Surgical History:  Procedure Laterality Date   COLONOSCOPY N/A 08/12/2017   Procedure: COLONOSCOPY;  Surgeon: Rogene Houston, MD;  [facility];  Service: Endoscopy;  Laterality: N/A;  730   NO PAST SURGERIES         Home Medications    Prior to Admission medications   Medication Sig Start Date End Date Taking? Authorizing Provider  amoxicillin (AMOXIL) 875 MG tablet Take 1 tablet (875 mg total) by mouth 2 (two) times daily. 12/23/20  Yes Scot Jun, FNP  promethazine-dextromethorphan (PROMETHAZINE-DM) 6.25-15 MG/5ML syrup Take 5 mLs by mouth 4 (four) times daily as needed for cough. 12/23/20  Yes Scot Jun, FNP  amLODipine (NORVASC) 10 MG tablet Take 1 tablet (10 mg total) by mouth daily. 12/04/20   Fayrene Helper, MD  anastrozole (ARIMIDEX) 1 MG tablet Take 1 tablet (1 mg total) by mouth daily. 12/10/20   McKenzie, Candee Furbish, MD  cetirizine (ZYRTEC) 10 MG tablet Take 10 mg by mouth daily.    [provider]  clomiPHENE (CLOMID) 50 MG tablet Take  0.5 tablets (25 mg total) by mouth daily. 12/10/20   McKenzie, Candee Furbish, MD  cyclobenzaprine (FLEXERIL) 10 MG tablet TAKE ONE TABLET BY MOUTH ONCE DAILY AT BEDTIME, AS NEEDED, FOR MUSCLE SPASM 12/04/20   Fayrene Helper, MD  EPINEPHrine 0.3 mg/0.3 mL IJ SOAJ injection Inject 0.3 mg into the muscle once.  07/20/17   [provider]  fluticasone (FLONASE) 50 MCG/ACT nasal spray Place 2 sprays into both nostrils  daily. 06/05/20   Lindell Spar, MD  hydrochlorothiazide (MICROZIDE) 12.5 MG capsule TAKE 1 CAPSULE BY MOUTH EVERY DAY 07/18/20   Fayrene Helper, MD  Krill Oil 500 MG CAPS Take 500 mg by mouth daily.     [provider]  montelukast (SINGULAIR) 10 MG tablet Take 1 tablet (10 mg total) by mouth at bedtime. 10/22/20   Garnet Sierras, DO  Multiple Vitamin (MULTIVITAMIN WITH MINERALS) TABS tablet Take 2 tablets by mouth daily.     [provider]  tadalafil (CIALIS) 20 MG tablet Take 1 tablet (20 mg total) by mouth daily as needed. 12/10/20   McKenzie, Candee Furbish, MD    Family History Family History  Problem Relation Age of Onset   Stroke Father    Hypertension Father    Hypertension Mother        GAB   Heart disease Mother        Angina   Hypertension Sister    Hypertension Sister    Hypertension Brother    Hypertension Brother    Colon cancer Neg Hx     Social History Social History   Tobacco Use   Smoking status: Never   Smokeless tobacco: Never  Vaping Use   Vaping Use: Never used  Substance Use Topics   Alcohol use: Yes    Comment: occ; six pack per week   Drug use: No     Allergies   Sulfa antibiotics, Ace inhibitors, No known allergies, Sulfasalazine, Sulfonamide derivatives, and Sulfamethoxazole-trimethoprim   Review of Systems Pertinent negatives listed in HPI   Physical Exam Triage Vital Signs ED Triage Vitals  Enc Vitals Group     BP 12/23/20 0856 120/86     Pulse Rate 12/23/20 0856 79     Resp 12/23/20 0856 18     Temp 12/23/20 0856 98 F (36.7 C)     Temp Source 12/23/20 0856 Tympanic     SpO2 12/23/20 0856 97 %     Weight --      Height --      Head Circumference --      Peak Flow --      Pain Score 12/23/20 0853 7     Pain Loc --      Pain Edu? --      Excl. in [place]? --    No data found.  Updated Vital Signs BP 120/86 (BP Location: Right Arm)   Pulse 79   Temp 98 F (36.7 C) (Tympanic)   Resp 18   SpO2 97%    Visual Acuity Right Eye Distance:   Left Eye Distance:   Bilateral Distance:    Right Eye Near:   Left Eye Near:    Bilateral Near:     Physical Exam   General Appearance:    Alert, cooperative, no distress  HENT:   Normocephalic, ears normal, nares mucosal edema with congestion, rhinorrhea, oropharynx    Eyes:    PERRL, conjunctiva/corneas clear, EOM's intact       Lungs:  Clear to auscultation bilaterally, respirations unlabored  Heart:    Regular rate and rhythm  Neurologic:   Awake, alert, oriented x 3. No apparent focal neurological           defect.     UC Treatments / Results  Labs (all labs ordered are listed, but only abnormal results are displayed) Labs Reviewed - No data to display  EKG   Radiology No results found.  Procedures Procedures (including critical care time)  Medications Ordered in UC Medications - No data to display  Initial Impression / Assessment and Plan / UC Course  I have reviewed the triage vital signs and the nursing notes.  Pertinent labs & imaging results that were available during my care of the patient were reviewed by me and considered in my medical decision making (see chart for details).    Acute sinusitis treatment today with amoxicillin 875 twice daily Promethazine DM for cough RTC as needed Final Clinical Impressions(s) / UC Diagnoses   Final diagnoses:  Acute non-recurrent maxillary sinusitis   Discharge Instructions   None    ED Prescriptions     Medication Sig Dispense Auth. Provider   amoxicillin (AMOXIL) 875 MG tablet Take 1 tablet (875 mg total) by mouth 2 (two) times daily. 20 tablet Scot Jun, FNP   promethazine-dextromethorphan (PROMETHAZINE-DM) 6.25-15 MG/5ML syrup Take 5 mLs by mouth 4 (four) times daily as needed for cough. 118 mL Scot Jun, FNP      PDMP not reviewed this encounter.   Scot Jun, FNP 12/23/20 1004

## 2021-01-16 ENCOUNTER — Telehealth: Payer: Self-pay

## 2021-01-16 DIAGNOSIS — N5201 Erectile dysfunction due to arterial insufficiency: Secondary | ICD-10-CM

## 2021-01-16 IMAGING — MR MR SHOULDER*L* W/O CM
4 of 5 series · 19 of 40 positions shown · non-contrast
Comparison: X-ray 10/22/2018

CLINICAL DATA: Left shoulder pain, weight lifting injury

EXAM:
MRI OF THE LEFT SHOULDER WITHOUT CONTRAST
TECHNIQUE: Multiplanar, multisequence MR imaging of the shoulder was performed.
No intravenous contrast was administered.

[Series 3: PD fat-sat · axial · 4.0mm · 0.28mm/px · z∈[-43,+45]mm · 8 of 20 slices shown (1 of 2)]
[im 1/20]
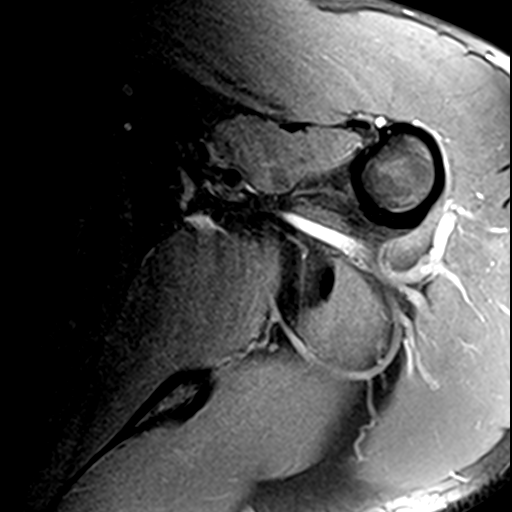
[im 3/20]
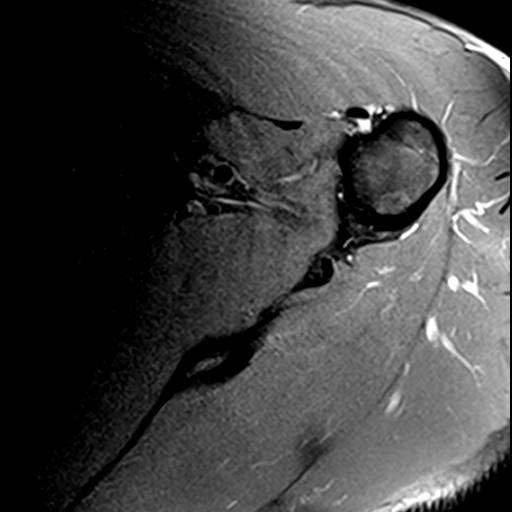
[im 6/20]
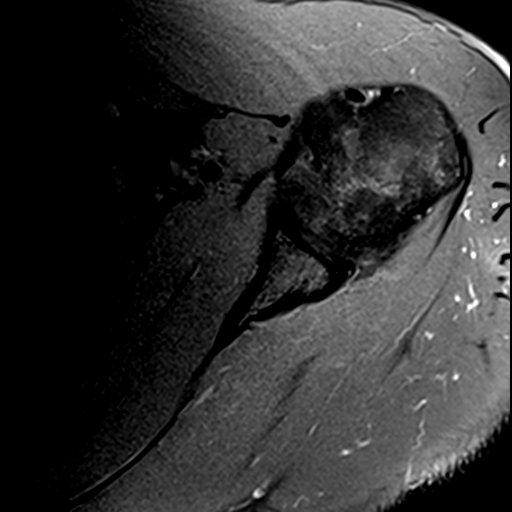
[im 9/20]
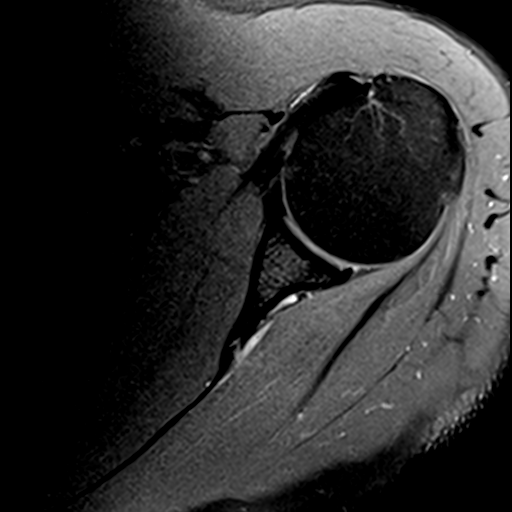
[im 11/20]
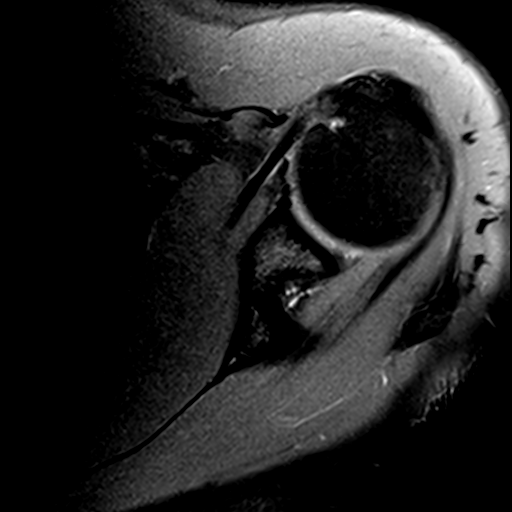
[im 14/20]
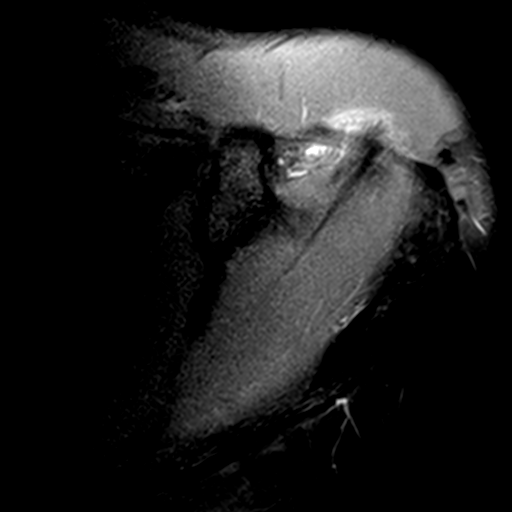
[im 17/20]
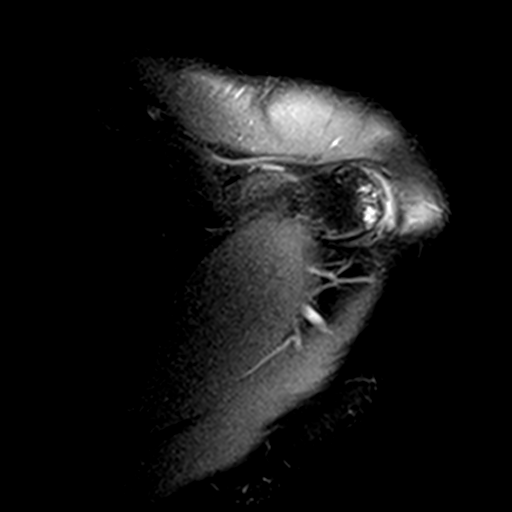
[im 20/20]
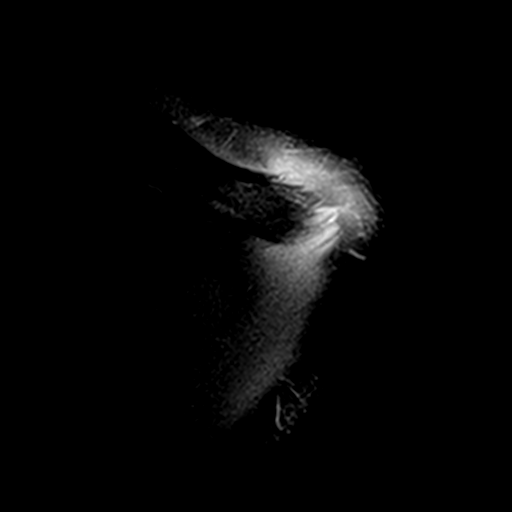

[Series 4: T2 fat-sat · oblique · 4.0mm · 0.29mm/px · 3 of 20 slices shown (1 of 2)]
[im 4/20]
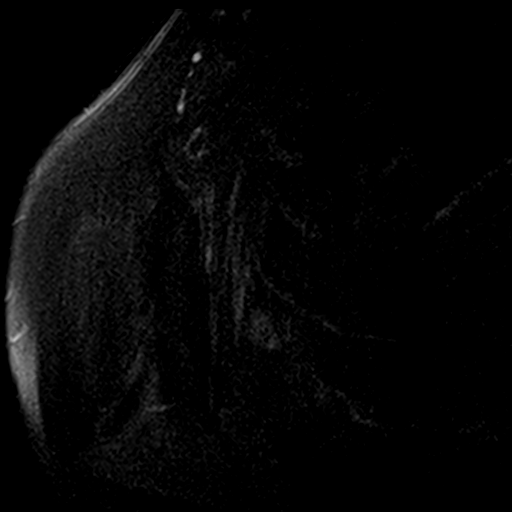
[im 10/20]
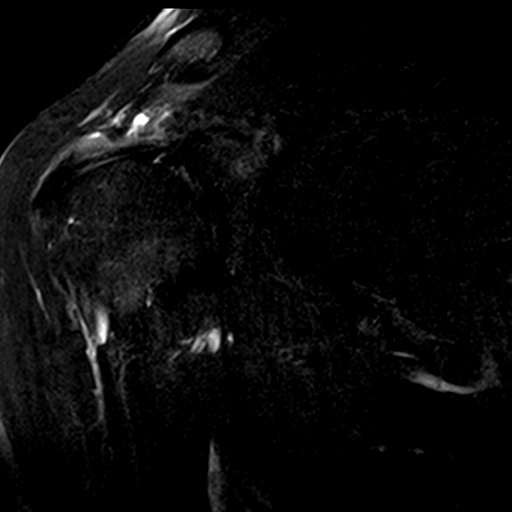
[im 16/20]
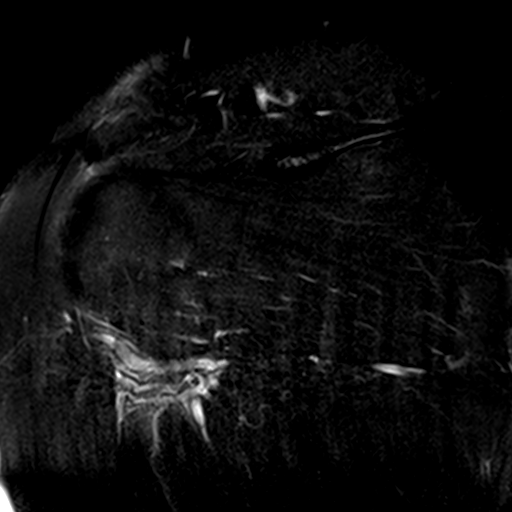

[Series 5: PD fat-sat · oblique · 4.0mm · 0.29mm/px · 5 of 20 slices shown (2 of 2)]
[im 1/20]
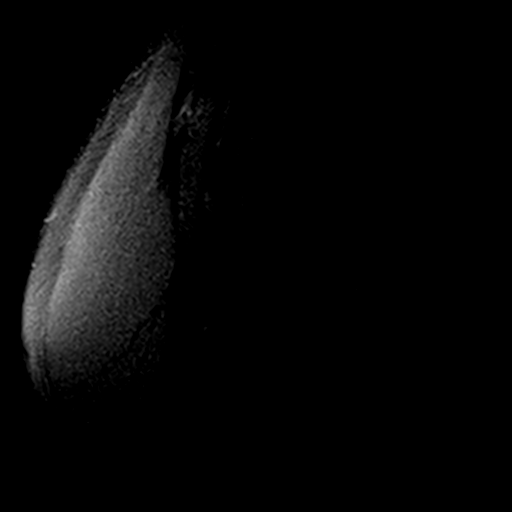
[im 4/20]
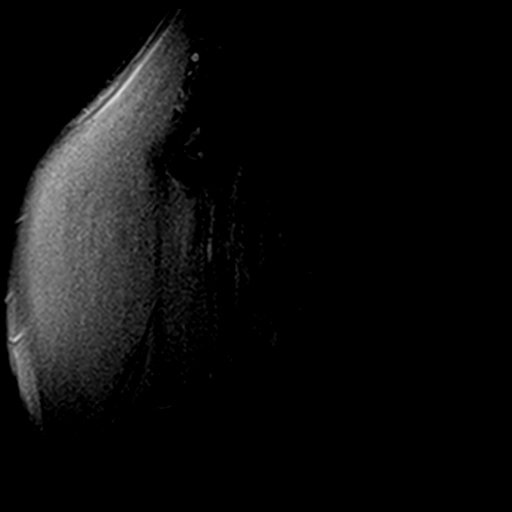
[im 7/20]
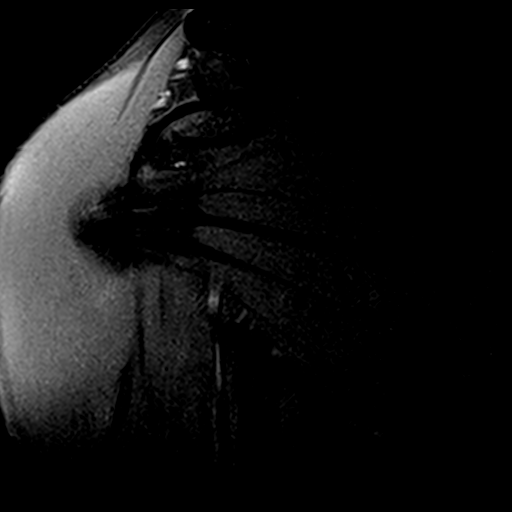
[im 10/20]
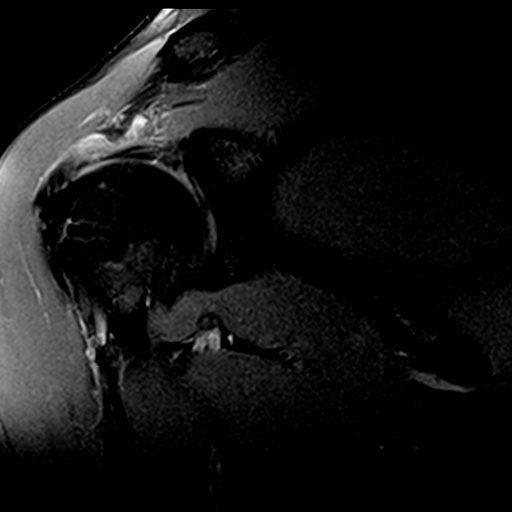
[im 16/20]
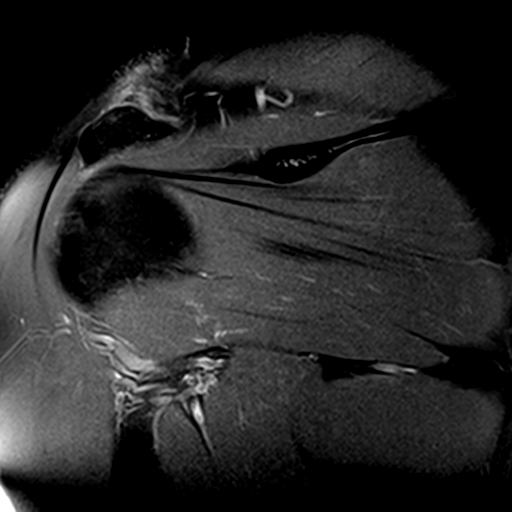

[Series 6: T2 fat-sat · oblique · 4.0mm · 0.28mm/px · 3 of 24 slices shown (2 of 2)]
[im 3/24]
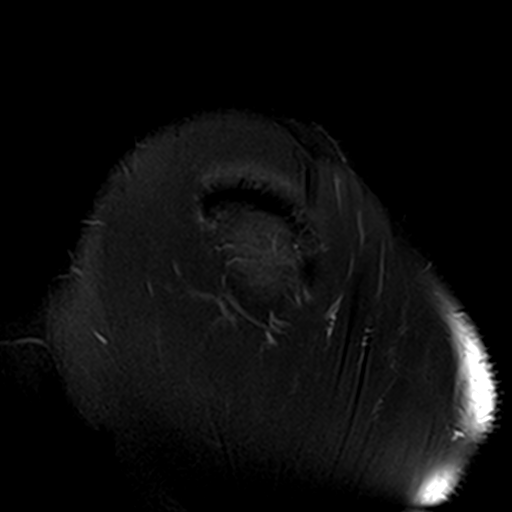
[im 12/24]
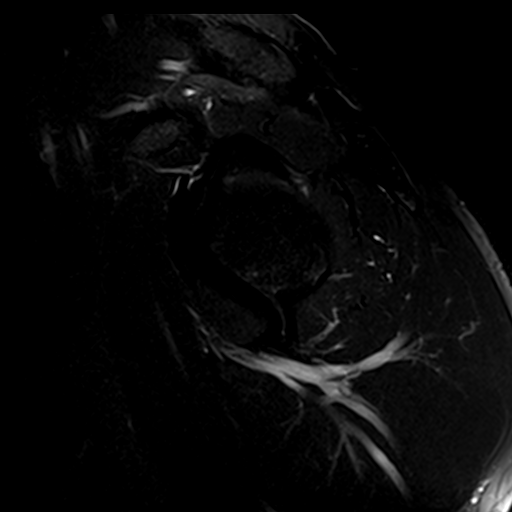
[im 21/24]
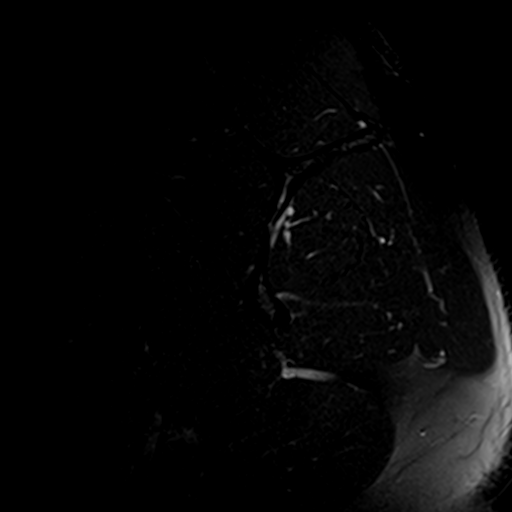

[19 of 40 positions shown; findings below may reference images not displayed]

FINDINGS: Rotator cuff: Supraspinatus tendinosis with low-grade bursal sided
fraying of the anterior and mid portions of the distal tendon
underlying the coracoacromial ligament. The infraspinatus,
subscapularis, and teres minor tendons are intact.

Muscles: No atrophy or abnormal signal of the muscles of the rotator
cuff.

Biceps long head:  Intact.

Acromioclavicular Joint: Mild arthropathy of the acromioclavicular
joint. Trace subacromial-subdeltoid bursal fluid.

Glenohumeral Joint: No joint effusion. No chondral defect.

Labrum: Grossly intact, but evaluation is limited by lack of
intraarticular fluid.

Bones:  No marrow abnormality, fracture or dislocation.

Other: None.
IMPRESSION: 1. Supraspinatus tendinosis with low-grade bursal sided fraying of
the anterior and mid portions of the distal tendon. No
full-thickness rotator cuff tear.
2. Mild subacromial-subdeltoid bursitis.
3. Mild AC joint arthropathy.

## 2021-01-16 NOTE — Telephone Encounter (Signed)
Patient called and wanted to know if he could be prescribed Viagra or the generic instead of tadalafil (CIALIS) 20 MG tablet which is his current medication.   Pharmacy: [facility] [address] [place] [address]

## 2021-01-16 NOTE — Telephone Encounter (Signed)
See below. Please advise.  

## 2021-01-22 NOTE — Telephone Encounter (Signed)
Patient needing Viagra filled asap.  Thanks, Helene Kelp

## 2021-01-24 MED ORDER — SILDENAFIL CITRATE 100 MG PO TABS
100.0000 mg | ORAL_TABLET | Freq: Every day | ORAL | 6 refills | Status: DC | PRN
Start: 2021-01-24 — End: 2021-04-08

## 2021-01-24 NOTE — Telephone Encounter (Signed)
Can you provide dosing please

## 2021-01-24 NOTE — Telephone Encounter (Signed)
Per Dr. Alyson Ingles, Viagra 100mg  1 tablet by mouth daily as needed.  Dispense 10 with 6 refills.  Rx sent to [place].

## 2021-01-24 NOTE — Addendum Note (Signed)
Addended by: Dorisann Frames on: 01/24/2021 03:56 PM   Modules accepted: Orders

## 2021-03-25 ENCOUNTER — Other Ambulatory Visit: Payer: No Typology Code available for payment source

## 2021-03-25 ENCOUNTER — Other Ambulatory Visit: Payer: Self-pay

## 2021-03-25 DIAGNOSIS — R7989 Other specified abnormal findings of blood chemistry: Secondary | ICD-10-CM

## 2021-03-30 LAB — COMPREHENSIVE METABOLIC PANEL
ALT: 26 IU/L (ref 0–44)
AST: 29 IU/L (ref 0–40)
Albumin/Globulin Ratio: 1.4 (ref 1.2–2.2)
Albumin: 4.2 g/dL (ref 3.8–4.9)
Alkaline Phosphatase: 57 IU/L (ref 44–121)
BUN/Creatinine Ratio: 10 (ref 9–20)
BUN: 13 mg/dL (ref 6–24)
Bilirubin Total: 0.4 mg/dL (ref 0.0–1.2)
CO2: 23 mmol/L (ref 20–29)
Calcium: 9.1 mg/dL (ref 8.7–10.2)
Chloride: 104 mmol/L (ref 96–106)
Creatinine, Ser: 1.32 mg/dL — ABNORMAL HIGH (ref 0.76–1.27)
Globulin, Total: 2.9 g/dL (ref 1.5–4.5)
Glucose: 99 mg/dL (ref 70–99)
Potassium: 4.2 mmol/L (ref 3.5–5.2)
Sodium: 140 mmol/L (ref 134–144)
Total Protein: 7.1 g/dL (ref 6.0–8.5)
eGFR: 64 mL/min/{1.73_m2} (ref >59)

## 2021-03-30 LAB — CBC WITH DIFFERENTIAL
Basophils Absolute: 0.1 10*3/uL (ref 0.0–0.2)
Basos: 1 %
EOS (ABSOLUTE): 0.5 10*3/uL — ABNORMAL HIGH (ref 0.0–0.4)
Eos: 11 %
Hematocrit: 48.9 % (ref 37.5–51.0)
Hemoglobin: 16.4 g/dL (ref 13.0–17.7)
Immature Grans (Abs): 0 10*3/uL (ref 0.0–0.1)
Immature Granulocytes: 0 %
Lymphocytes Absolute: 2 10*3/uL (ref 0.7–3.1)
Lymphs: 42 %
MCH: 29.2 pg (ref 26.6–33.0)
MCHC: 33.5 g/dL (ref 31.5–35.7)
MCV: 87 fL (ref 79–97)
Monocytes Absolute: 0.4 10*3/uL (ref 0.1–0.9)
Monocytes: 9 %
Neutrophils Absolute: 1.8 10*3/uL (ref 1.4–7.0)
Neutrophils: 37 %
RBC: 5.61 x10E6/uL (ref 4.14–5.80)
RDW: 13.1 % (ref 11.6–15.4)
WBC: 4.8 10*3/uL (ref 3.4–10.8)

## 2021-03-30 LAB — TESTOSTERONE,FREE AND TOTAL
Testosterone, Free: 8.9 pg/mL (ref 7.2–24.0)
Testosterone: 397 ng/dL (ref 264–916)

## 2021-03-30 LAB — ESTRADIOL: Estradiol: 32.1 pg/mL (ref 7.6–42.6)

## 2021-04-03 ENCOUNTER — Other Ambulatory Visit: Payer: Self-pay

## 2021-04-08 ENCOUNTER — Other Ambulatory Visit: Payer: Self-pay | Admitting: Urology

## 2021-04-08 ENCOUNTER — Ambulatory Visit (INDEPENDENT_AMBULATORY_CARE_PROVIDER_SITE_OTHER): Payer: 59 | Admitting: Urology

## 2021-04-08 ENCOUNTER — Other Ambulatory Visit: Payer: Self-pay

## 2021-04-08 ENCOUNTER — Encounter: Payer: Self-pay | Admitting: Urology

## 2021-04-08 DIAGNOSIS — N5201 Erectile dysfunction due to arterial insufficiency: Secondary | ICD-10-CM

## 2021-04-08 DIAGNOSIS — R7989 Other specified abnormal findings of blood chemistry: Secondary | ICD-10-CM | POA: Diagnosis not present

## 2021-04-08 LAB — URINALYSIS, ROUTINE W REFLEX MICROSCOPIC
Bilirubin, UA: NEGATIVE
Glucose, UA: NEGATIVE
Leukocytes,UA: NEGATIVE
Nitrite, UA: NEGATIVE
Protein,UA: NEGATIVE
RBC, UA: NEGATIVE
Specific Gravity, UA: >1.03 — ABNORMAL HIGH (ref 1.005–1.030)
Urobilinogen, Ur: 0.2 mg/dL (ref 0.2–1.0)
pH, UA: 6 (ref 5.0–7.5)

## 2021-04-08 LAB — BLADDER SCAN AMB NON-IMAGING: Scan Result: 15

## 2021-04-08 MED ORDER — ANASTROZOLE 1 MG PO TABS
1.0000 mg | ORAL_TABLET | Freq: Every day | ORAL | 3 refills | Status: DC
Start: 2021-04-08 — End: 2021-11-07

## 2021-04-08 MED ORDER — SILDENAFIL CITRATE 100 MG PO TABS: 100.0000 mg | ORAL_TABLET | ORAL | 6 refills | Status: DC | PRN

## 2021-04-08 MED ORDER — CLOMIPHENE CITRATE 50 MG PO TABS: 25.0000 mg | ORAL_TABLET | Freq: Every day | ORAL | 3 refills | Status: DC

## 2021-04-08 NOTE — Progress Notes (Signed)
post void residual=15 

## 2021-04-08 NOTE — Progress Notes (Signed)
? ?04/08/2021 ?2:31 PM  ? ?Dione Housekeeper ?06-26-1965 ?[id number] ? ?Referring provider: Fayrene Helper, MD ?[address] ?[place],  [place] [address] ? ?Followup ED and hypogonadism ? ? ?HPI: ?Evan Jacobson is a 55yo here for followup for ED and hypogonadism. Testerone 397, hgb 16.4, creatinine 1.3, estradiol normal on clomid '25mg'$  daily and anastrazole '1mg'$  daily. Energy good. Good libido. He issues with erections improved after stopping HCTZ. No other complaints today ? ? ?PMH: ?Past Medical History:  ?Diagnosis Date  ? Allergic rhinitis   ? Bipolar disorder ([place]) 08/25/2011  ? Chronic back pain   ? Concussion 2012  ? GERD (gastroesophageal reflux disease)   ? Groin pain, chronic, right 06/28/2014  ? Heart palpitations 06/18/2016  ? Hypertension   ? Persistent adjustment disorder 12/05/2015  ? Chronic adjustment disorder diagnosed by [facility] and counseling recomended  ? PTSD (post-traumatic stress disorder)   ? Sinusitis   ? Skin lump of leg, left 11/17/2017  ? Vertigo 01/08/2017  ? ? ?Surgical History: ?Past Surgical History:  ?Procedure Laterality Date  ? COLONOSCOPY N/A 08/12/2017  ? Procedure: COLONOSCOPY;  Surgeon: Rogene Houston, MD;  Location: [facility];  Service: Endoscopy;  Laterality: N/A;  730  ? NO PAST SURGERIES    ? ? ?Home Medications:  ?Allergies as of 04/08/2021   ? ?   Reactions  ? Sulfa Antibiotics Anaphylaxis  ? Ace Inhibitors Other (See Comments)  ? Feels as though throat is closing up intermittently  ? No Known Allergies   ? Other reaction(s): MALE ERECTILE DISORDER  ? Sulfasalazine Other (See Comments)  ? Tongue swelled  ? Sulfonamide Derivatives Other (See Comments)  ? Tongue swelled  ? Sulfamethoxazole-trimethoprim Hives, Rash  ? ?  ? ?  ?Medication List  ?  ? ?  ? Accurate as of April 08, 2021  2:31 PM. If you have any questions, ask your nurse or doctor.  ?  ?  ? ?  ? ?amLODipine 10 MG tablet ?Commonly known as: NORVASC ?Take 1 tablet (10 mg total) by mouth daily. ?  ?amoxicillin 875  MG tablet ?Commonly known as: AMOXIL ?Take 1 tablet (875 mg total) by mouth 2 (two) times daily. ?  ?anastrozole 1 MG tablet ?Commonly known as: ARIMIDEX ?Take 1 tablet (1 mg total) by mouth daily. ?  ?cetirizine 10 MG tablet ?Commonly known as: ZYRTEC ?Take 10 mg by mouth daily. ?  ?clomiPHENE 50 MG tablet ?Commonly known as: CLOMID ?Take 0.5 tablets (25 mg total) by mouth daily. ?  ?cyclobenzaprine 10 MG tablet ?Commonly known as: FLEXERIL ?TAKE ONE TABLET BY MOUTH ONCE DAILY AT BEDTIME, AS NEEDED, FOR MUSCLE SPASM ?  ?EPINEPHrine 0.3 mg/0.3 mL Soaj injection ?Commonly known as: EPI-PEN ?Inject 0.3 mg into the muscle once. ?  ?fluticasone 50 MCG/ACT nasal spray ?Commonly known as: FLONASE ?Place 2 sprays into both nostrils daily. ?  ?hydrochlorothiazide 12.5 MG capsule ?Commonly known as: MICROZIDE ?TAKE 1 CAPSULE BY MOUTH EVERY DAY ?  ?Krill Oil 500 MG Caps ?Take 500 mg by mouth daily. ?  ?montelukast 10 MG tablet ?Commonly known as: Singulair ?Take 1 tablet (10 mg total) by mouth at bedtime. ?  ?multivitamin with minerals Tabs tablet ?Take 2 tablets by mouth daily. ?  ?promethazine-dextromethorphan 6.25-15 MG/5ML syrup ?Commonly known as: PROMETHAZINE-DM ?Take 5 mLs by mouth 4 (four) times daily as needed for cough. ?  ?sildenafil 100 MG tablet ?Commonly known as: VIAGRA ?Take 1 tablet (100 mg total) by  mouth daily as needed for erectile dysfunction. ?  ?tadalafil 20 MG tablet ?Commonly known as: CIALIS ?Take 1 tablet (20 mg total) by mouth daily as needed. ?  ? ?  ? ? ?Allergies:  ?Allergies  ?Allergen Reactions  ? Sulfa Antibiotics Anaphylaxis  ? Ace Inhibitors Other (See Comments)  ?  Feels as though throat is closing up intermittently  ? No Known Allergies   ?  Other reaction(s): MALE ERECTILE DISORDER  ? Sulfasalazine Other (See Comments)  ?  Tongue swelled ?  ? Sulfonamide Derivatives Other (See Comments)  ?  Tongue swelled  ? Sulfamethoxazole-Trimethoprim Hives and Rash  ? ? ?Family History: ?Family  History  ?Problem Relation Age of Onset  ? Stroke Father   ? Hypertension Father   ? Hypertension Mother   ?     GAB  ? Heart disease Mother   ?     Angina  ? Hypertension Sister   ? Hypertension Sister   ? Hypertension Brother   ? Hypertension Brother   ? Colon cancer Neg Hx   ? ? ?Social History:  reports that he has never smoked. He has never used smokeless tobacco. He reports current alcohol use. He reports that he does not use drugs. ? ?ROS: ?All other review of systems were reviewed and are negative except what is noted above in HPI ? ?Physical Exam: ?There were no vitals taken for this visit.  ?Constitutional:  Alert and oriented, No acute distress. ?HEENT: [place] AT, moist mucus membranes.  Trachea midline, no masses. ?Cardiovascular: No clubbing, cyanosis, or edema. ?Respiratory: Normal respiratory effort, no increased work of breathing. ?GI: Abdomen is soft, nontender, nondistended, no abdominal masses ?GU: No CVA tenderness.  ?Lymph: No cervical or inguinal lymphadenopathy. ?Skin: No rashes, bruises or suspicious lesions. ?Neurologic: Grossly intact, no focal deficits, moving all 4 extremities. ?Psychiatric: Normal mood and affect. ? ?Laboratory Data: ?Lab Results  ?Component Value Date  ? WBC 4.8 03/25/2021  ? HGB 16.4 03/25/2021  ? HCT 48.9 03/25/2021  ? MCV 87 03/25/2021  ? PLT 165 12/05/2020  ? ? ?Lab Results  ?Component Value Date  ? CREATININE 1.32 (H) 03/25/2021  ? ? ?Lab Results  ?Component Value Date  ? PSA 1.0 06/03/2019  ? PSA 1.2 07/22/2018  ? PSA 1.0 06/18/2016  ? ? ?Lab Results  ?Component Value Date  ? TESTOSTERONE 397 03/25/2021  ? ? ?Lab Results  ?Component Value Date  ? HGBA1C 5.2 07/11/2020  ? ? ?Urinalysis ?   ?Component Value Date/Time  ? [place] YELLOW 07/28/2013 2028  ? APPEARANCEUR Clear 09/05/2020 1520  ? LABSPEC 1.015 07/28/2013 2028  ? PHURINE 6.0 07/28/2013 2028  ? GLUCOSEU Negative 09/05/2020 1520  ? HGBUR TRACE (A) 07/28/2013 2028  ? HGBUR negative 12/05/2008 1305  ?  BILIRUBINUR Negative 09/05/2020 1520  ? [place] NEGATIVE 07/28/2013 2028  ? PROTEINUR Negative 09/05/2020 1520  ? [place] NEGATIVE 07/28/2013 2028  ? UROBILINOGEN 0.2 07/28/2013 2028  ? NITRITE Negative 09/05/2020 1520  ? NITRITE NEGATIVE 07/28/2013 2028  ? LEUKOCYTESUR Negative 09/05/2020 1520  ? ? ?Lab Results  ?Component Value Date  ? LABMICR See below: 09/05/2020  ? [place] None seen 09/05/2020  ? LABEPIT 0-10 09/05/2020  ? BACTERIA None seen 09/05/2020  ? ? ?Pertinent Imaging: ? ?No results found for this or any previous visit. ? ?No results found for this or any previous visit. ? ?No results found for this or any previous visit. ? ?No results found for this or any  previous visit. ? ?No results found for this or any previous visit. ? ?No results found for this or any previous visit. ? ?No results found for this or any previous visit. ? ?No results found for this or any previous visit. ? ? ?Assessment & Plan:   ? ?1. Low testosterone ?-continue clomid '25mg'$  daily and anastrazole '1mg'$  daily ?- Urinalysis, Routine w reflex microscopic ?- BLADDER SCAN AMB NON-IMAGING ? ?2. Erectile dysfunction due to arterial insufficiency ?-Viagra '100mg'$  prn ? ? ?No follow-ups on file. ? ?Nicolette Bang, MD ? ?[facility] [place] ?  ?

## 2021-04-08 NOTE — Patient Instructions (Signed)
Hypogonadism, Male ?Male hypogonadism is a condition of having a level of testosterone that is lower than normal. Testosterone is a chemical, or hormone, that is made mainly in the testicles. ?In boys, testosterone is responsible for the development of male characteristics during puberty. These include: ?Making the penis bigger. ?Growing and building the muscles. ?Growing facial hair. ?Deepening the voice. ?In adult men, testosterone is responsible for maintaining: ?An interest in sex and the ability to have sex. ?Muscle mass. ?Sperm production. ?Red blood cell production. ?Bone strength. ?Testosterone also gives men energy and a sense of well-being. ?Testosterone normally decreases as men age and the testicles make less testosterone. Testosterone levels can vary from man to man. Not all men will have signs and symptoms of low testosterone. Weight, alcohol use, medicines, and certain medical conditions can affect a man's testosterone level. ?What are the causes? ?This condition is caused by: ?A natural decrease in testosterone that occurs as a man grows older. This is the main cause of this condition. ?Use of medicines, such as antidepressants, steroids, and opioids. ?Diseases and conditions that affect the testicles or the making of testosterone. These include: ?Injury or damage to the testicles from trauma, cancer, cancer treatment, or infection. ?Diabetes. ?Sleep apnea. ?Genetic conditions that men are born with. ?Disease of the pituitary gland. This gland is in the brain. It produces hormones. ?Obesity. ?Metabolic syndrome. This is a group of diseases that affect blood pressure, blood sugar, cholesterol, and belly fat. ?HIV or AIDS. ?Alcohol abuse. ?Kidney failure. ?Other long-term or chronic diseases. ?What are the signs or symptoms? ?Common symptoms of this condition include: ?Loss of interest in sex (low sex drive). ?Inability to have or maintain an erection (erectile dysfunction). ?Feeling tired  (fatigue). ?Mood changes, like irritability or depression. ?Loss of muscle and body hair. ?Infertility. ?Large breasts. ?Weight gain (obesity). ?How is this diagnosed? ?Your health care provider can diagnose hypogonadism based on: ?Your signs and symptoms. ?A physical exam to check your testosterone levels. This includes blood tests. Testosterone levels can change throughout the day. Levels are highest in the morning. You may need to have repeat blood tests before getting a diagnosis of hypogonadism. ?Depending on your medical history and test results, your health care provider may also do other tests to find the cause of low testosterone. ?How is this treated? ?This condition is treated with testosterone replacement therapy. Testosterone can be given by: ?Injection or through pellets inserted under the skin. ?Gels or patches placed on the skin or in the mouth. ?Testosterone therapy is not for everyone. It has risks and side effects. Your health care provider will consider your medical history, your risk for prostate cancer, your age, and your symptoms before putting you on testosterone replacement therapy. ?Follow these instructions at home: ?Take over-the-counter and prescription medicines only as told by your health care provider. ?Eat foods that are high in fiber, such as beans, whole grains, and fresh fruits and vegetables. Limit foods that are high in fat and processed sugars, such as fried or sweet foods. ?If you drink alcohol: ?Limit how much you have to 0-2 drinks a day. ?Know how much alcohol is in your drink. In the [place] one drink equals one 12 oz bottle of beer (355 mL), one 5 oz glass of wine (148 mL), or one 1? oz glass of hard liquor (44 mL). ?Return to your normal activities as told by your health care provider. Ask your health care provider what activities are safe for you. ?Keep all follow-up  visits. This is important. ?Contact a health care provider if: ?You have any of the signs or symptoms of  low testosterone. ?You have any side effects from testosterone therapy. ?Summary ?Male hypogonadism is a condition of having a level of testosterone that is lower than normal. ?The natural drop in testosterone production that occurs with age is the most common cause of this condition. ?Low testosterone can also be caused by many diseases and conditions that affect the testicles and the making of testosterone. ?This condition is treated with testosterone replacement therapy. ?There are risks and side effects of testosterone therapy. Your health care provider will consider your age, medical history, symptoms, and risks for prostate cancer before putting you on testosterone therapy. ?This information is not intended to replace advice given to you by your health care provider. Make sure you discuss any questions you have with your health care provider. ?Document Revised: 09/15/2019 Document Reviewed: 09/15/2019 ?Elsevier Patient Education ? [place]. ? ?

## 2021-04-12 ENCOUNTER — Encounter: Payer: Self-pay | Admitting: Family Medicine

## 2021-04-12 ENCOUNTER — Ambulatory Visit (INDEPENDENT_AMBULATORY_CARE_PROVIDER_SITE_OTHER): Payer: 59 | Admitting: Family Medicine

## 2021-04-12 ENCOUNTER — Telehealth: Payer: Self-pay

## 2021-04-12 ENCOUNTER — Other Ambulatory Visit: Payer: Self-pay

## 2021-04-12 VITALS — BP 140/87 | HR 66 | Resp 16 | Ht 71.0 in | Wt 227.0 lb

## 2021-04-12 DIAGNOSIS — E162 Hypoglycemia, unspecified: Secondary | ICD-10-CM

## 2021-04-12 DIAGNOSIS — H6983 Other specified disorders of Eustachian tube, bilateral: Secondary | ICD-10-CM

## 2021-04-12 DIAGNOSIS — I10 Essential (primary) hypertension: Secondary | ICD-10-CM

## 2021-04-12 DIAGNOSIS — M5410 Radiculopathy, site unspecified: Secondary | ICD-10-CM | POA: Diagnosis not present

## 2021-04-12 DIAGNOSIS — R42 Dizziness and giddiness: Secondary | ICD-10-CM

## 2021-04-12 DIAGNOSIS — H903 Sensorineural hearing loss, bilateral: Secondary | ICD-10-CM | POA: Insufficient documentation

## 2021-04-12 NOTE — Progress Notes (Signed)
? ?Evan Jacobson     MRN: [id number]      DOB: 05/28/1965 ? ? ?HPI ?Evan Jacobson is here for follow up and re-evaluation of chronic medical conditions, medication management and review of any available recent lab and radiology data.  ?Preventive health is updated, specifically  Cancer screening and Immunization.   ?Recurrent light headedness still a problem , pT feels he has vertebrobasilar insufficiency is the issue and specific recommendation by PT that pt see Dr Percival Spanish, he wants to follow up on this, has had extensive workup in the past by Neurology and Cardiology for same problem ?Wife who accompanies him states that at times when he becomes light headed , he is "revived" with a meal, behaving as though he may have low blood sugar ?States he feels good all day and as soon as he starts driving he gets light headed, requests Dr Percival Spanish  ?Staets 3 to 4 times per year he has severe flare of back pain and spasm, which make it impossible for him to move, wife substantiated this , states he has an upcoming appt with pain management pre [facility] re back pain and spasm ?Was in UC once last year with c/o back pain, requests handicap sticker for as needed use ? ?ROS ?Denies recent fever or chills. ?Denies sinus pressure, nasal congestion, ear pain or sore throat. ?Denies chest congestion, productive cough or wheezing. ?Denies chest pains, palpitations and leg swelling ?Denies abdominal pain, nausea, vomiting,diarrhea or constipation.   ?Denies dysuria, frequency, hesitancy or incontinence. ?Denies headaches, seizures, numbness, or tingling. ?Denies uncontrolled depression, anxiety or insomnia.States antidepressant medication makes him feel worse, spaced out, so not taking ?Denies skin break down or rash. ? ? ?PE ? ?BP 140/87   Pulse 66   Resp 16   Ht '5\' 11"'$  (1.803 m)   Wt 227 lb (103 kg)   SpO2 96%   BMI 31.66 kg/m?  ? ?Patient alert and oriented and in no cardiopulmonary distress. ? ?HEENT: No facial  asymmetry, EOMI,     Neck supple . ? ?Chest: Clear to auscultation bilaterally. ? ?CVS: S1, S2 no murmurs, no S3.Regular rate. ? ?ABD: Soft non tender.  ? ?Ext: No edema ? ?MS: Adequate ROM spine, shoulders, hips and knees. ? ?Skin: Intact, no ulcerations or rash noted. ? ?Psych: Good eye contact, normal affect. Memory intact not anxious or depressed appearing. ? ?CNS: CN 2-12 intact, power,  normal throughout.no focal deficits noted. ? ? ?Assessment & Plan ? ?Light headedness ?Reports ongoing prolem sinxce 2019,some better, now able to drive for 30 mins after that feels light headed unable to drive, recovers 93Z % of the time with food ?rept cardiology eval, dr Percival Spanish per pt request ? ?Essential hypertension ?Uncontrolled at visit, pt reports good numbers at home, record review shows inadequately controlled, he is encouraged and advised to take both antihyperensive meds as prescribed ?DASH diet and commitment to daily physical activity for a minimum of 30 minutes discussed and encouraged, as a part of hypertension management. ?The importance of attaining a healthy weight is also discussed. ? ?BP/Weight 04/12/2021 12/23/2020 12/10/2020 10/22/2020 09/19/2020 09/15/2020 09/14/2020  ?Systolic BP 169 678 938 101 121 127 133  ?Diastolic BP 87 86 89 90 82 88 88  ?Wt. (Lbs) 227 - - 224.6 - 220 -  ?BMI 31.66 - - 31.33 - 30.68 -  ?Some encounter information is confidential and restricted. Go to Review Flowsheets activity to see all data.  ? ? ? ? ? ?  Back pain with left-sided radiculopathy ?Reports severe flares 3 to 4 times / year which make it impossible to walk, temporary handicap sticker, has upcoming pain mx appt ? ?Light-headed feeling ?Recurrent light headedness ? ?ERECTILE DYSFUNCTION ?Being treated by Urology ? ?

## 2021-04-12 NOTE — Telephone Encounter (Signed)
Sent a note this patient has a [facility] referral today and [facility] is [facility] ?

## 2021-04-12 NOTE — Assessment & Plan Note (Signed)
Reports ongoing prolem sinxce 2019,some better, now able to drive for 30 mins after that feels light headed unable to drive, recovers 85F % of the time with food ?rept cardiology eval, dr Percival Spanish per pt request ?

## 2021-04-12 NOTE — Patient Instructions (Signed)
F/U in September, call if you needme sooner ? ?You are referred to Cardiology and Neurology ? ?Handicap sticker  ? ?Thanks for choosing [facility], we consider it a privelige to serve you. ? ? ? ?

## 2021-04-14 ENCOUNTER — Encounter: Payer: Self-pay | Admitting: Family Medicine

## 2021-04-14 NOTE — Assessment & Plan Note (Signed)
Recurrent light headedness ?

## 2021-04-14 NOTE — Assessment & Plan Note (Signed)
Reports severe flares 3 to 4 times / year which make it impossible to walk, temporary handicap sticker, has upcoming pain mx appt ?

## 2021-04-14 NOTE — Assessment & Plan Note (Signed)
Uncontrolled at visit, pt reports good numbers at home, record review shows inadequately controlled, he is encouraged and advised to take both antihyperensive meds as prescribed ?DASH diet and commitment to daily physical activity for a minimum of 30 minutes discussed and encouraged, as a part of hypertension management. ?The importance of attaining a healthy weight is also discussed. ? ?BP/Weight 04/12/2021 12/23/2020 12/10/2020 10/22/2020 09/19/2020 09/15/2020 09/14/2020  ?Systolic BP 941 740 814 481 121 127 133  ?Diastolic BP 87 86 89 90 82 88 88  ?Wt. (Lbs) 227 - - 224.6 - 220 -  ?BMI 31.66 - - 31.33 - 30.68 -  ?Some encounter information is confidential and restricted. Go to Review Flowsheets activity to see all data.  ? ? ? ? ?

## 2021-04-14 NOTE — Assessment & Plan Note (Signed)
Being treated by Urology ?

## 2021-04-16 ENCOUNTER — Encounter: Payer: Self-pay | Admitting: Family Medicine

## 2021-04-24 ENCOUNTER — Encounter: Payer: Self-pay | Admitting: Nurse Practitioner

## 2021-04-24 ENCOUNTER — Other Ambulatory Visit: Payer: Self-pay

## 2021-04-24 ENCOUNTER — Ambulatory Visit: Payer: 59 | Admitting: Nurse Practitioner

## 2021-04-24 VITALS — BP 143/90 | HR 69 | Ht 71.0 in | Wt 225.4 lb

## 2021-04-24 DIAGNOSIS — R55 Syncope and collapse: Secondary | ICD-10-CM | POA: Diagnosis not present

## 2021-04-24 DIAGNOSIS — E162 Hypoglycemia, unspecified: Secondary | ICD-10-CM | POA: Diagnosis not present

## 2021-04-24 LAB — POCT GLYCOSYLATED HEMOGLOBIN (HGB A1C): Hemoglobin A1C: 5.1 % (ref 4.0–5.6)

## 2021-04-24 NOTE — Patient Instructions (Signed)
Hypoglycemia °Hypoglycemia is when the sugar (glucose) level in your blood is too low. Low blood sugar can happen to people who have diabetes and people who do not have diabetes. Low blood sugar can happen quickly, and it can be an emergency. °What are the causes? °This condition happens most often in people who have diabetes. It may be caused by: °Diabetes medicine. °Not eating enough, or not eating often enough. °Doing more physical activity. °Drinking alcohol on an empty stomach. °If you do not have diabetes, this condition may be caused by: °A tumor in the pancreas. °Not eating enough, or not eating for long periods at a time (fasting). °A very bad infection or illness. °Problems after having weight loss (bariatric) surgery. °Kidney failure or liver failure. °Certain medicines. °What increases the risk? °This condition is more likely to develop in people who: °Have diabetes and take medicines to lower their blood sugar. °Abuse alcohol. °Have a very bad illness. °What are the signs or symptoms? °Mild °Hunger. °Sweating and feeling clammy. °Feeling dizzy or light-headed. °Being sleepy or having trouble sleeping. °Feeling like you may vomit (nauseous). °A fast heartbeat. °A headache. °Blurry vision. °Mood changes, such as: °Being grouchy. °Feeling worried or nervous (anxious). °Tingling or loss of feeling (numbness) around your mouth, lips, or tongue. °Moderate °Confusion and poor judgment. °Behavior changes. °Weakness. °Uneven heartbeat. °Trouble with moving (coordination). °Very low °Very low blood sugar (severe hypoglycemia) is a medical emergency. It can cause: °Fainting. °Seizures. °Loss of consciousness (coma). °Death. °How is this treated? °Treating low blood sugar °Low blood sugar is often treated by eating or drinking something that has sugar in it right away. The food or drink should contain 15 grams of a fast-acting carb (carbohydrate). Options include: °4 oz (120 mL) of fruit juice. °4 oz (120  °

## 2021-04-24 NOTE — Progress Notes (Signed)
? ?    Endocrinology Consult Note ?                                           04/24/2021, 11:33 AM ? ? ?Subjective:  ? ? Patient ID: Evan Jacobson, male    DOB: Jul 22, 1965, PCP Fayrene Helper, MD ? ? ?Past Medical History:  ?Diagnosis Date  ? Allergic rhinitis   ? Bipolar disorder ([place]) 08/25/2011  ? Chronic back pain   ? Concussion 2012  ? GERD (gastroesophageal reflux disease)   ? Groin pain, chronic, right 06/28/2014  ? Heart palpitations 06/18/2016  ? Hypertension   ? Persistent adjustment disorder 12/05/2015  ? Chronic adjustment disorder diagnosed by [facility] and counseling recomended  ? PTSD (post-traumatic stress disorder)   ? Sinusitis   ? Skin lump of leg, left 11/17/2017  ? Vertigo 01/08/2017  ? ?Past Surgical History:  ?Procedure Laterality Date  ? COLONOSCOPY N/A 08/12/2017  ? Procedure: COLONOSCOPY;  Surgeon: Rogene Houston, MD;  [facility]: [facility];  Service: Endoscopy;  Laterality: N/A;  730  ? NO PAST SURGERIES    ? ?Social History  ? ?Socioeconomic History  ? Marital status: Married  ?  Spouse name: Not on file  ? Number of children: 2  ? Years of education: Not on file  ? Highest education level: Not on file  ?Occupational History  ?  Comment: Audio video tech  ?  Employer: [employer]  ?Tobacco Use  ? Smoking status: Never  ? Smokeless tobacco: Never  ?Vaping Use  ? Vaping Use: Never used  ?Substance and Sexual Activity  ? Alcohol use: Yes  ?  Comment: occ; six pack per week  ? Drug use: No  ? Sexual activity: Yes  ?  Birth control/protection: None  ?Other Topics Concern  ? Not on file  ?Social History Narrative  ? Not on file  ? ?Social Determinants of Health  ? ?Financial Resource Strain: Not on file  ?Food Insecurity: Not on file  ?Transportation Needs: Not on file  ?Physical Activity: Not on file  ?Stress: Not on file  ?Social Connections: Not on file  ? ?Family History  ?Problem Relation Age of Onset  ? Stroke Father   ? Hypertension Father   ? Hypertension Mother   ?     GAB  ? Heart  disease Mother   ?     Angina  ? Hypertension Sister   ? Hypertension Sister   ? Hypertension Brother   ? Hypertension Brother   ? Colon cancer Neg Hx   ? ?Outpatient Encounter Medications as of 04/24/2021  ?Medication Sig  ? amLODipine (NORVASC) 10 MG tablet Take 1 tablet (10 mg total) by mouth daily.  ? anastrozole (ARIMIDEX) 1 MG tablet Take 1 tablet (1 mg total) by mouth daily.  ? cetirizine (ZYRTEC) 10 MG tablet Take 10 mg by mouth daily.  ? clomiPHENE (CLOMID) 50 MG tablet Take 0.5 tablets (25 mg total) by mouth daily.  ? EPINEPHrine 0.3 mg/0.3 mL IJ SOAJ injection Inject 0.3 mg into the muscle once.   ? fluticasone (FLONASE) 50 MCG/ACT nasal spray Place 2 sprays into both nostrils daily.  ? Krill Oil 500 MG CAPS Take 500 mg by mouth daily.   ? Multiple Vitamin (MULTIVITAMIN WITH MINERALS) TABS tablet Take 2 tablets by mouth daily.   ? sildenafil (VIAGRA) 100  MG tablet Take 1 tablet (100 mg total) by mouth as needed for erectile dysfunction.  ? [DISCONTINUED] hydrochlorothiazide (MICROZIDE) 12.5 MG capsule TAKE 1 CAPSULE BY MOUTH EVERY DAY (Patient not taking: Reported on 04/12/2021)  ? ?No facility-administered encounter medications on file as of 04/24/2021.  ? ?ALLERGIES: ?Allergies  ?Allergen Reactions  ? Sulfa Antibiotics Anaphylaxis  ? Ace Inhibitors Other (See Comments)  ?  Feels as though throat is closing up intermittently  ? No Known Allergies   ?  Other reaction(s): MALE ERECTILE DISORDER  ? Sulfasalazine Other (See Comments)  ?  Tongue swelled ?  ? Sulfonamide Derivatives Other (See Comments)  ?  Tongue swelled  ? Sulfamethoxazole-Trimethoprim Hives and Rash  ? ? ?VACCINATION STATUS: ?Immunization History  ?Administered Date(s) Administered  ? Influenza, Quadrivalent, Recombinant, Inj, Pf 11/15/2018  ? Influenza,inj,Quad PF,6+ Mos 02/03/2020  ? PFIZER(Purple Top)SARS-COV-2 Vaccination 03/26/2019, 04/16/2019, 06/11/2019, 07/02/2019, 12/05/2019  ? Td 04/13/2008  ? Tdap 02/03/2020  ? Zoster Recombinat  (Shingrix) 11/17/2017, 02/08/2018  ? ? ?Hypoglycemia ?This is a recurrent problem. The current episode started more than 1 year ago. The problem occurs intermittently. The problem has been waxing and waning. Associated symptoms comments: Dizziness, lightheadedness, syncope. He has tried eating and drinking for the symptoms. The treatment provided moderate relief.  ?Evan Jacobson is 56 y.o. male who presents today with a medical history as above. he is being seen in consultation for ?hypoglycemia requested by Fayrene Helper, MD.  he has been dealing with symptoms of dizziness, lightheadedness and syncope intermittently for several years.  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  He has a very strict diet, eats only about 1500 calories per day.  For breakfast he usually has a protein bar and yogurt.  For lunch he typically only eats a yogurt and for supper he eats a meat and mostly veggies.  He will snack on healthy items such as pistachios or fruit. ? ?He has been to several different specialists to be worked up for his symptoms (cardiology and neurology) and nothing was ever found to be the cause of his symptoms. ? ?Review of Systems ? ?Constitutional: no recent weight gain/loss, no fatigue, no subjective hyperthermia, no subjective hypothermia ?Eyes: no blurry vision, no xerophthalmia ?ENT: no sore throat, no nodules palpated in throat, no dysphagia/odynophagia, no hoarseness ?Cardiovascular: no Chest Pain, no Shortness of Breath, no palpitations, no leg swelling ?Respiratory: no cough, no shortness of breath ?Gastrointestinal: no Nausea/Vomiting/Diarrhea ?Musculoskeletal: no muscle/joint aches ?Skin: no rashes ?Neurological: no tremors, no numbness, no tingling, no dizziness ?Psychiatric: no depression, no anxiety ? ?Objective:  ?  ? ?  04/24/2021  ? 10:32 AM 04/12/2021  ?  1:20 PM 12/23/2020  ?  8:56 AM  ?Vitals with BMI  ?Height '5\' 11"'$  '5\' 11"'$    ?Weight 225 lbs 6 oz 227 lbs   ?BMI 31.45 31.67    ?Systolic 465 681 275  ?Diastolic 90 87 86  ?Pulse 69 66 79  ? ? ?BP (!) 143/90   Pulse 69   Ht '5\' 11"'$  (1.803 m)   Wt 225 lb 6.4 oz (102.2 kg)   BMI 31.44 kg/m?   ?Wt Readings from Last 3 Encounters:  ?04/24/21 225 lb 6.4 oz (102.2 kg)  ?04/12/21 227 lb (103 kg)  ?10/22/20 224 lb 9.6 oz (101.9 kg)  ?  ? ?Physical Exam- Limited ? ?Constitutional:  Body mass index is 31.44 kg/m?. , not in acute distress, normal state of mind ?Eyes:  EOMI,  no exophthalmos ?Neck: Supple ?Cardiovascular: RRR, no murmurs, rubs, or gallops, no edema ?Respiratory: Adequate breathing efforts, no crackles, rales, rhonchi, or wheezing ?Musculoskeletal: no gross deformities, strength intact in all four extremities, no gross restriction of joint movements ?Skin:  no rashes, no hyperemia ?Neurological: no tremor with outstretched hands ? ?CMP ( most recent) ?CMP  ?   ?Component Value Date/Time  ? NA 140 03/25/2021 1453  ? K 4.2 03/25/2021 1453  ? CL 104 03/25/2021 1453  ? CO2 23 03/25/2021 1453  ? GLUCOSE 99 03/25/2021 1453  ? GLUCOSE 102 (H) 09/15/2020 1537  ? BUN 13 03/25/2021 1453  ? CREATININE 1.32 (H) 03/25/2021 1453  ? CREATININE 1.09 06/03/2019 0900  ? CALCIUM 9.1 03/25/2021 1453  ? PROT 7.1 03/25/2021 1453  ? ALBUMIN 4.2 03/25/2021 1453  ? AST 29 03/25/2021 1453  ? ALT 26 03/25/2021 1453  ? ALKPHOS 57 03/25/2021 1453  ? BILITOT 0.4 03/25/2021 1453  ? GFRNONAA >60 09/15/2020 1537  ? GFRNONAA 78 06/03/2019 0900  ? GFRAA 76 02/03/2020 0817  ? GFRAA 90 06/03/2019 0900  ? ? ? ?Diabetic Labs (most recent): ?Lab Results  ?Component Value Date  ? HGBA1C 5.2 07/11/2020  ? HGBA1C 5.0 06/03/2019  ? HGBA1C 5.2 01/26/2017  ? ? ? Lipid Panel ( most recent) ?Lipid Panel  ?   ?Component Value Date/Time  ? CHOL 185 06/04/2020 0805  ? TRIG 63 06/04/2020 0805  ? HDL 53 06/04/2020 0805  ? CHOLHDL 3.5 06/04/2020 0805  ? CHOLHDL 2.3 06/03/2019 0900  ? VLDL 14 06/18/2016 0936  ? [place] 120 (H) 06/04/2020 0805  ? LDLCALC 84 06/03/2019 0900  ? LABVLDL 12  06/04/2020 0805  ? ?  ? ?Lab Results  ?Component Value Date  ? TSH 0.946 06/04/2020  ? TSH 1.050 10/13/2019  ? TSH 1.57 06/03/2019  ? TSH 0.74 07/22/2018  ? TSH 1.23 01/26/2017  ? TSH 0.50 09/18/2016  ? TSH 1.38 05/

## 2021-04-28 ENCOUNTER — Ambulatory Visit
Admission: EM | Admit: 2021-04-28 | Discharge: 2021-04-28 | Disposition: A | Discharge status: Home / Self Care | Payer: No Typology Code available for payment source | Attending: Urgent Care | Admitting: Urgent Care

## 2021-04-28 ENCOUNTER — Encounter: Payer: Self-pay | Admitting: Emergency Medicine

## 2021-04-28 DIAGNOSIS — M5416 Radiculopathy, lumbar region: Secondary | ICD-10-CM

## 2021-04-28 DIAGNOSIS — M5442 Lumbago with sciatica, left side: Secondary | ICD-10-CM

## 2021-04-28 DIAGNOSIS — S39012A Strain of muscle, fascia and tendon of lower back, initial encounter: Secondary | ICD-10-CM

## 2021-04-28 MED ORDER — TIZANIDINE HCL 4 MG PO TABS: 4.0000 mg | ORAL_TABLET | Freq: Every day | ORAL | 0 refills | Status: DC

## 2021-04-28 MED ORDER — PREDNISONE 50 MG PO TABS: 50.0000 mg | ORAL_TABLET | Freq: Every day | ORAL | 0 refills | Status: DC

## 2021-04-28 MED ORDER — METHYLPREDNISOLONE SODIUM SUCC 125 MG IJ SOLR
125.0000 mg | Freq: Once | INTRAMUSCULAR | Status: AC
  Administered 2021-04-28: 125 mg via INTRAMUSCULAR

## 2021-04-28 NOTE — ED Triage Notes (Signed)
Felt back pull yesterday when getting out of shower.  Lower back pain that radiates down both legs, worse on left side ?

## 2021-04-30 ENCOUNTER — Encounter: Payer: Self-pay | Admitting: Family Medicine

## 2021-04-30 ENCOUNTER — Telehealth: Payer: Self-pay | Admitting: Family Medicine

## 2021-04-30 NOTE — Telephone Encounter (Signed)
Appt already scheduled.

## 2021-04-30 NOTE — Telephone Encounter (Signed)
Sch appt for tomorrow  ?

## 2021-04-30 NOTE — Telephone Encounter (Signed)
Patient called in regard to back. ? ?Patients back went out Saturday 4/1 . ? ?Patient followed up with the Urgent care on Sunday 4/2.  ? ?Patient states that they did give prednizone that alone is not helping. ? ?Wants to know If provider will give shot for pain as well to help with back .  ? ?Patient would like a call back in regard. ?

## 2021-05-01 ENCOUNTER — Other Ambulatory Visit: Payer: Self-pay

## 2021-05-01 ENCOUNTER — Ambulatory Visit (INDEPENDENT_AMBULATORY_CARE_PROVIDER_SITE_OTHER): Payer: 59 | Admitting: Family Medicine

## 2021-05-01 ENCOUNTER — Encounter: Payer: Self-pay | Admitting: Family Medicine

## 2021-05-01 VITALS — BP 126/82 | HR 85 | Ht 71.0 in | Wt 221.0 lb

## 2021-05-01 DIAGNOSIS — M5410 Radiculopathy, site unspecified: Secondary | ICD-10-CM | POA: Diagnosis not present

## 2021-05-01 DIAGNOSIS — I10 Essential (primary) hypertension: Secondary | ICD-10-CM

## 2021-05-01 MED ORDER — KETOROLAC TROMETHAMINE 60 MG/2ML IM SOLN
60.0000 mg | Freq: Once | INTRAMUSCULAR | Status: AC
  Administered 2021-05-01: 60 mg via INTRAMUSCULAR

## 2021-05-01 MED ORDER — PREDNISONE 10 MG (21) PO TBPK: ORAL_TABLET | ORAL | 0 refills | Status: DC

## 2021-05-01 NOTE — Progress Notes (Addendum)
? ?  DAILY CRATE     MRN: [id number]      DOB: 08-10-1965 ? ? ?HPI ?[name] is here 3 day h/o acute LBP to both legs, left more than right, numbness in left foot and left leg weak, reports poor urnary stream due to pain. ?Reports LLE weakness which is new. ? Received steroid shot in UC but still having significant pain ?No known trigger ?ROS ?Denies recent fever or chills. ?Denies sinus pressure, nasal congestion, ear pain or sore throat. ?Denies chest congestion, productive cough or wheezing. ?Denies chest pains, palpitations and leg swelling ?Denies abdominal pain, nausea, vomiting,diarrhea or constipation.   ?Denies dysuria, frequency,  or incontinence. ? ?Denies depression, anxiety or insomnia. ?Denies skin break down or rash. ? ? ?PE ? ?BP 126/82   Pulse 85   Ht '5\' 11"'$  (1.803 m)   Wt 221 lb 0.6 oz (100.3 kg)   SpO2 97%   BMI 30.83 kg/m?  ? ?Patient alert and oriented and in no cardiopulmonary distress.Pt in pain and limping ? ?HEENT: No facial asymmetry, EOMI,     Neck supple . ? ?Chest: Clear to auscultation bilaterally. ? ?CVS: S1, S2 no murmurs, no S3.Regular rate. ? ?ABD: Soft non tender.  ? ?Ext: No edema ? ?MS: Markedly reduced  ROM lumbar  spine, adequate in shoulders, hips and knees. ?Abnormal gait ?Skin: Intact, no ulcerations or rash noted. ? ?Psych: Good eye contact, normal affect. Memory intact not anxious or depressed appearing. ? ?CNS: CN 2-12 intact, grade 4 power, in lLE, otherwise  normal throughout.no sensory defit on exam ? ? ?Assessment & Plan ? ?Back pain with left-sided radiculopathy ?Toradol 60 mg IM in office followed by prednisone dose pack. MRI update due to symptom severity, numbness and weakness , unprovoked ? ?Essential hypertension ?Controlled, no change in medication ? ? ? ?

## 2021-05-01 NOTE — Patient Instructions (Signed)
F/U in 6 weeks, call if you need me sooner ? ?Toradol,in office, take prednisone 50 mg tabs as prescribed , then the 10 mg dose paCK ? ?YOU ARE BEING REFERRED FOR mri OF YOUR BACK WE WILL CALL WITH APPOINTMENT INFO ? ?BE CAREFUL NOT TO FALL ? ?Thanks for choosing [facility], we consider it a privelige to serve you. ? ?

## 2021-05-05 ENCOUNTER — Encounter: Payer: Self-pay | Admitting: Family Medicine

## 2021-05-05 NOTE — Assessment & Plan Note (Signed)
Toradol 60 mg IM in office followed by prednisone dose pack. MRI update due to symptom severity, numbness and weakness , unprovoked ?

## 2021-05-05 NOTE — Assessment & Plan Note (Signed)
Controlled, no change in medication  

## 2021-05-08 ENCOUNTER — Ambulatory Visit: Payer: Medicare Other | Admitting: Nurse Practitioner

## 2021-05-08 ENCOUNTER — Ambulatory Visit: Payer: Medicare Other

## 2021-05-08 DIAGNOSIS — E162 Hypoglycemia, unspecified: Secondary | ICD-10-CM

## 2021-05-27 ENCOUNTER — Ambulatory Visit (HOSPITAL_COMMUNITY): Admission: RE | Admit: 2021-05-27 | Payer: 59 | Source: Ambulatory Visit

## 2021-05-27 NOTE — Progress Notes (Deleted)
Cardiology Office Note   Date:  05/27/2021   ID:  Evan Jacobson, DOB [birth year], MRN [id number]  PCP:  Fayrene Helper, MD  Cardiologist:   Rozann Lesches, MD Referring:  ***  No chief complaint on file.     History of Present Illness: Evan Jacobson is a 56 y.o. male who presents for ***    He saw Dr. Domenic Polite in 2021 for chest pain and had minimal coronary plaque on CT   Past Medical History:  Diagnosis Date   Allergic rhinitis    Bipolar disorder ([place]) 08/25/2011   Chronic back pain    Concussion 2012   GERD (gastroesophageal reflux disease)    Groin pain, chronic, right 06/28/2014   Heart palpitations 06/18/2016   Hypertension    Persistent adjustment disorder 12/05/2015   Chronic adjustment disorder diagnosed by [facility] and counseling recomended   PTSD (post-traumatic stress disorder)    Sinusitis    Skin lump of leg, left 11/17/2017   Vertigo 01/08/2017    Past Surgical History:  Procedure Laterality Date   COLONOSCOPY N/A 08/12/2017   Procedure: COLONOSCOPY;  Surgeon: Rogene Houston, MD;  [facility];  Service: Endoscopy;  Laterality: N/A;  730   NO PAST SURGERIES       Current Outpatient Medications  Medication Sig Dispense Refill   amLODipine (NORVASC) 10 MG tablet Take 1 tablet (10 mg total) by mouth daily. 90 tablet 1   anastrozole (ARIMIDEX) 1 MG tablet Take 1 tablet (1 mg total) by mouth daily. 90 tablet 3   cetirizine (ZYRTEC) 10 MG tablet Take 10 mg by mouth daily.     clomiPHENE (CLOMID) 50 MG tablet Take 0.5 tablets (25 mg total) by mouth daily. 45 tablet 3   EPINEPHrine 0.3 mg/0.3 mL IJ SOAJ injection Inject 0.3 mg into the muscle once.      fluticasone (FLONASE) 50 MCG/ACT nasal spray Place 2 sprays into both nostrils daily. 16 g 6   Krill Oil 500 MG CAPS Take 500 mg by mouth daily.      Multiple Vitamin (MULTIVITAMIN WITH MINERALS) TABS tablet Take 2 tablets by mouth daily.      predniSONE (DELTASONE) 50 MG tablet Take 1  tablet (50 mg total) by mouth daily with breakfast. 5 tablet 0   predniSONE (STERAPRED UNI-PAK 21 TAB) 10 MG (21) TBPK tablet Use as directed 21 tablet 0   sildenafil (VIAGRA) 100 MG tablet Take 1 tablet (100 mg total) by mouth as needed for erectile dysfunction. 10 tablet 6   tiZANidine (ZANAFLEX) 4 MG tablet Take 1 tablet (4 mg total) by mouth at bedtime. 30 tablet 0   No current facility-administered medications for this visit.    Allergies:   Sulfa antibiotics, Ace inhibitors, No known allergies, Sulfasalazine, Sulfonamide derivatives, and Sulfamethoxazole-trimethoprim    ROS:  Please see the history of present illness.   Otherwise, review of systems are positive for {NONE DEFAULTED:18576}.   All other systems are reviewed and negative.    PHYSICAL EXAM: VS:  There were no vitals taken for this visit. , BMI There is no height or weight on file to calculate BMI. GENERAL:  Well appearing NECK:  No jugular venous distention, waveform within normal limits, carotid upstroke brisk and symmetric, no bruits, no thyromegaly LUNGS:  Clear to auscultation bilaterally CHEST:  Unremarkable HEART:  PMI not displaced or sustained,S1 and S2 within normal limits, no S3, no S4, no clicks, no rubs, *** murmurs ABD:  Flat, positive bowel sounds normal in frequency in pitch, no bruits, no rebound, no guarding, no midline pulsatile mass, no hepatomegaly, no splenomegaly EXT:  2 plus pulses throughout, no edema, no cyanosis no clubbing   ***GENERAL:  Well appearing HEENT:  Pupils equal round and reactive, fundi not visualized, oral mucosa unremarkable NECK:  No jugular venous distention, waveform within normal limits, carotid upstroke brisk and symmetric, no bruits, no thyromegaly LYMPHATICS:  No cervical, inguinal adenopathy LUNGS:  Clear to auscultation bilaterally BACK:  No CVA tenderness CHEST:  Unremarkable HEART:  PMI not displaced or sustained,S1 and S2 within normal limits, no S3, no S4, no  clicks, no rubs, *** murmurs ABD:  Flat, positive bowel sounds normal in frequency in pitch, no bruits, no rebound, no guarding, no midline pulsatile mass, no hepatomegaly, no splenomegaly EXT:  2 plus pulses throughout, no edema, no cyanosis no clubbing SKIN:  No rashes no nodules NEURO:  Cranial nerves II through XII grossly intact, motor grossly intact throughout PSYCH:  Cognitively intact, oriented to person place and time    EKG:  EKG {ACTION; IS/IS ZOX:[id number]} ordered today. The ekg ordered today demonstrates ***   Recent Labs: 06/04/2020: TSH 0.946 12/05/2020: Platelets 165 03/25/2021: ALT 26; BUN 13; Creatinine, Ser 1.32; Hemoglobin 16.4; Potassium 4.2; Sodium 140    Lipid Panel    Component Value Date/Time   CHOL 185 06/04/2020 0805   TRIG 63 06/04/2020 0805   HDL 53 06/04/2020 0805   CHOLHDL 3.5 06/04/2020 0805   CHOLHDL 2.3 06/03/2019 0900   VLDL 14 06/18/2016 0936   LDLCALC 120 (H) 06/04/2020 0805   LDLCALC 84 06/03/2019 0900      Wt Readings from Last 3 Encounters:  05/01/21 221 lb 0.6 oz (100.3 kg)  04/24/21 225 lb 6.4 oz (102.2 kg)  04/12/21 227 lb (103 kg)      Other studies Reviewed: Additional studies/ records that were reviewed today include: ***. Review of the above records demonstrates:  Please see elsewhere in the note.  ***   ASSESSMENT AND PLAN:  CHEST PAIN:  ***  HTN:  ***    Current medicines are reviewed at length with the patient today.  The patient {ACTIONS; HAS/DOES NOT HAVE:19233} concerns regarding medicines.  The following changes have been made:  {PLAN; NO CHANGE:13088:s}  Labs/ tests ordered today include: *** No orders of the defined types were placed in this encounter.    Disposition:   FU with ***    Signed, Minus Breeding, MD  05/27/2021 9:04 PM    [facility]

## 2021-05-28 ENCOUNTER — Ambulatory Visit: Payer: No Typology Code available for payment source | Admitting: Cardiology

## 2021-05-28 DIAGNOSIS — R072 Precordial pain: Secondary | ICD-10-CM

## 2021-05-28 DIAGNOSIS — I10 Essential (primary) hypertension: Secondary | ICD-10-CM

## 2021-06-02 ENCOUNTER — Other Ambulatory Visit: Payer: Self-pay | Admitting: Family Medicine

## 2021-06-02 DIAGNOSIS — I10 Essential (primary) hypertension: Secondary | ICD-10-CM

## 2021-06-04 ENCOUNTER — Telehealth: Payer: Self-pay | Admitting: Cardiology

## 2021-06-04 NOTE — Telephone Encounter (Signed)
? ?  Pt is requesting to switch from Dr. Domenic Polite to Dr. Percival Spanish  ?

## 2021-06-05 ENCOUNTER — Encounter: Payer: Self-pay | Admitting: Family Medicine

## 2021-06-05 NOTE — Telephone Encounter (Signed)
Appt scheduled

## 2021-06-06 ENCOUNTER — Ambulatory Visit (INDEPENDENT_AMBULATORY_CARE_PROVIDER_SITE_OTHER): Payer: 59 | Admitting: Family Medicine

## 2021-06-06 ENCOUNTER — Encounter: Payer: Self-pay | Admitting: Family Medicine

## 2021-06-06 VITALS — BP 124/82 | HR 83 | Resp 14 | Ht 71.0 in | Wt 225.1 lb

## 2021-06-06 DIAGNOSIS — E669 Obesity, unspecified: Secondary | ICD-10-CM

## 2021-06-06 DIAGNOSIS — I10 Essential (primary) hypertension: Secondary | ICD-10-CM | POA: Diagnosis not present

## 2021-06-06 DIAGNOSIS — M5410 Radiculopathy, site unspecified: Secondary | ICD-10-CM

## 2021-06-06 MED ORDER — KETOROLAC TROMETHAMINE 60 MG/2ML IM SOLN
60.0000 mg | Freq: Once | INTRAMUSCULAR | Status: AC
  Administered 2021-06-06: 60 mg via INTRAMUSCULAR

## 2021-06-06 NOTE — Progress Notes (Signed)
? ?  Evan Jacobson     MRN: [id number]      DOB: 03/03/1965 ? ? ?HPI ?Evan Jacobson is here 3 day h/o LBP to left buttock aggravated by pressure washing house , pain started this past  Monday, 4 days ago, the same evening he was washing his home ?No new weakness , numbness or incontinence reported. ?Considering surgery, neurosurgeon retrieving recent MRI lumbar done at the [place] ?ROS ?See HPI ? ?Denies recent fever or chills. ?Denies sinus pressure, nasal congestion, ear pain or sore throat. ?Denies chest congestion, productive cough or wheezing. ?Denies chest pains, .   ?Denies uncontrolled depression, anxiety or insomnia. ?Denies skin break down or rash. ? ? ?PE ? ?BP 124/82   Pulse 83   Resp 14   Ht '5\' 11"'$  (1.803 m)   Wt 225 lb 1.9 oz (102.1 kg)   SpO2 97%   BMI 31.40 kg/m?  ? ? ?Patient alert and oriented and in no cardiopulmonary distress.Pt in pain ? ?HEENT: No facial asymmetry, EOMI,      ?Chest: Clear to auscultation bilaterally. ? ?CVS: S1, S2 no murmurs, no S3.Regular rate. ? ?  ? ?Ext: No edema ? ?MS: Decreased ROM lumbar spine,  adequate in shoulders, hips and knees. ? ?Skin: Intact, no ulcerations or rash noted. ? ?Psych: Good eye contact, normal affect. Memory intact not anxious or depressed appearing. ? ?CNS: CN 2-12 intact, power,  normal throughout.no focal deficits noted. ? ? ?Assessment & Plan ? ?Back pain with left-sided radiculopathy ?4 day history, trigger is overuse, no red flags based on history and exam ?Educated re need to avoid this ?Toradol 60 mg IM in office ? ?Essential hypertension ?Controlled, no change in medication ?DASH diet and commitment to daily physical activity for a minimum of 30 minutes discussed and encouraged, as a part of hypertension management. ?The importance of attaining a healthy weight is also discussed. ? ? ?  06/06/2021  ?  4:58 PM 06/06/2021  ?  4:54 PM 05/01/2021  ? 11:30 AM 04/28/2021  ? 11:35 AM 04/24/2021  ? 10:32 AM 04/12/2021  ?  1:20 PM 12/23/2020  ?  8:56 AM   ?BP/Weight  ?Systolic BP 956 387 564 332 143 140 120  ?Diastolic BP 82 90 82 82 90 87 86  ?Wt. (Lbs)  225.12 221.04  225.4 227   ?BMI  31.4 kg/m2 30.83 kg/m2  31.44 kg/m2 31.66 kg/m2   ? ? ? ? ? ?Obesity (BMI 30.0-34.9) ? ?Patient re-educated about  the importance of commitment to a  minimum of 150 minutes of exercise per week as able. ? ?The importance of healthy food choices with portion control discussed, as well as eating regularly and within a 12 hour window most days. ?The need to choose "clean , green" food 50 to 75% of the time is discussed, as well as to make water the primary drink and set a goal of 64 ounces water daily. ? ?  ? ?  06/06/2021  ?  4:54 PM 05/01/2021  ? 11:30 AM 04/24/2021  ? 10:32 AM  ?Weight /BMI  ?Weight 225 lb 1.9 oz 221 lb 0.6 oz 225 lb 6.4 oz  ?Height '5\' 11"'$  (1.803 m) '5\' 11"'$  (1.803 m) '5\' 11"'$  (1.803 m)  ?BMI 31.4 kg/m2 30.83 kg/m2 31.44 kg/m2  ? ? ? ? ?

## 2021-06-06 NOTE — Assessment & Plan Note (Signed)
?  Patient re-educated about  the importance of commitment to a  minimum of 150 minutes of exercise per week as able. ? ?The importance of healthy food choices with portion control discussed, as well as eating regularly and within a 12 hour window most days. ?The need to choose "clean , green" food 50 to 75% of the time is discussed, as well as to make water the primary drink and set a goal of 64 ounces water daily. ? ?  ? ?  06/06/2021  ?  4:54 PM 05/01/2021  ? 11:30 AM 04/24/2021  ? 10:32 AM  ?Weight /BMI  ?Weight 225 lb 1.9 oz 221 lb 0.6 oz 225 lb 6.4 oz  ?Height '5\' 11"'$  (1.803 m) '5\' 11"'$  (1.803 m) '5\' 11"'$  (1.803 m)  ?BMI 31.4 kg/m2 30.83 kg/m2 31.44 kg/m2  ? ? ? ?

## 2021-06-06 NOTE — Patient Instructions (Signed)
F/U as before, call if you need me sooner ? ?Toradol 60 mg IM in office for acute back pain ? ?Take aleve one twice daily for next 5 days ? ?RESPECT your back, some things you cannot and should not do!! ? ?Think about what you will eat, plan ahead. ?Choose " clean, green, fresh or frozen" over canned, processed or packaged foods which are more sugary, salty and fatty. ?70 to 75% of food eaten should be vegetables and fruit. ?Three meals at set times with snacks allowed between meals, but they must be fruit or vegetables. ?Aim to eat over a 12 hour period , example 7 am to 7 pm, and STOP after  your last meal of the day. ?Drink water,generally about 64 ounces per day, no other drink is as healthy. Fruit juice is best enjoyed in a healthy way, by EATING the fruit. ?Thanks for choosing [facility], we consider it a privelige to serve you. ? ?

## 2021-06-06 NOTE — Assessment & Plan Note (Signed)
Controlled, no change in medication ?DASH diet and commitment to daily physical activity for a minimum of 30 minutes discussed and encouraged, as a part of hypertension management. ?The importance of attaining a healthy weight is also discussed. ? ? ?  06/06/2021  ?  4:58 PM 06/06/2021  ?  4:54 PM 05/01/2021  ? 11:30 AM 04/28/2021  ? 11:35 AM 04/24/2021  ? 10:32 AM 04/12/2021  ?  1:20 PM 12/23/2020  ?  8:56 AM  ?BP/Weight  ?Systolic BP 761 950 932 671 143 140 120  ?Diastolic BP 82 90 82 82 90 87 86  ?Wt. (Lbs)  225.12 221.04  225.4 227   ?BMI  31.4 kg/m2 30.83 kg/m2  31.44 kg/m2 31.66 kg/m2   ? ? ? ? ?

## 2021-06-06 NOTE — Assessment & Plan Note (Signed)
4 day history, trigger is overuse, no red flags based on history and exam ?Educated re need to avoid this ?Toradol 60 mg IM in office ?

## 2021-06-12 ENCOUNTER — Ambulatory Visit (INDEPENDENT_AMBULATORY_CARE_PROVIDER_SITE_OTHER): Payer: 59 | Admitting: Family Medicine

## 2021-06-12 ENCOUNTER — Encounter: Payer: Self-pay | Admitting: Family Medicine

## 2021-06-12 VITALS — BP 140/90 | HR 79 | Ht 71.0 in | Wt 226.0 lb

## 2021-06-12 DIAGNOSIS — Z13220 Encounter for screening for lipoid disorders: Secondary | ICD-10-CM

## 2021-06-12 DIAGNOSIS — R413 Other amnesia: Secondary | ICD-10-CM

## 2021-06-12 DIAGNOSIS — I10 Essential (primary) hypertension: Secondary | ICD-10-CM

## 2021-06-12 MED ORDER — SPIRONOLACTONE 25 MG PO TABS: 25.0000 mg | ORAL_TABLET | Freq: Every day | ORAL | 2 refills | Status: DC

## 2021-06-12 NOTE — Progress Notes (Signed)
   Evan Jacobson     MRN: [id number]      DOB: 05-Feb-1965   HPI Evan Jacobson is here with a 4 month h/o short term memory loss intermittently,  most recently this past Sunday M morning , 4 days ago, was unable to recall any of this activities after  approx  8 pm Saturdy night, still unable, aopprox 1 month ago he collected food  at restaurant around 1 pm and has no recall of stopping a autos hop with family, or getting the food home or eating it, woke a t 3am the next day then  Has had recurrent light headeness wth driving after 30 mins ROS, gets disoriented  Denies recent fever or chills. Denies sinus pressure, nasal congestion, ear pain or sore throat. Denies chest congestion, productive cough or wheezing. Denies chest pains, palpitations and leg swelling Denies abdominal pain, nausea, vomiting,diarrhea or constipation.   Denies dysuria, frequency, hesitancy or incontinence. Denies joint pain, swelling and limitation in mobility. Denies headaches, seizures, numbness, or tingling. Denies depression, anxiety or insomnia. Denies skin break down or rash.   PE  BP (!) 134/92   Pulse 79   Ht '5\' 11"'$  (1.803 m)   Wt 226 lb 0.6 oz (102.5 kg)   SpO2 96%   BMI 31.53 kg/m   MMSE 30  Patient alert and oriented and in no cardiopulmonary distress.  HEENT: No facial asymmetry, EOMI,     Neck supple .  Chest: Clear to auscultation bilaterally.  CVS: S1, S2 no murmurs, no S3.Regular rate.  ABD: Soft non tender.   Ext: No edema  MS: Adequate though reduced  ROM spine, shoulders, hips and knees.  Skin: Intact, no ulcerations or rash noted.  Psych: Good eye contact, normal affect. Memory intact not anxious or depressed appearing.  CNS: CN 2-12 intact, power,  normal throughout.no focal deficits noted.   Assessment & Plan  Memory loss of unknown cause Recurrent episodes of amnesia in past several months, brain scan and Neurology eval, has normal MMSE  Essential  hypertension Uncontrolled, add spironolactone DASH diet and commitment to daily physical activity for a minimum of 30 minutes discussed and encouraged, as a part of hypertension management. The importance of attaining a healthy weight is also discussed.     06/12/2021    2:55 PM 06/12/2021    1:31 PM 06/06/2021    4:58 PM 06/06/2021    4:54 PM 05/01/2021   11:30 AM 04/28/2021   11:35 AM 04/24/2021   10:32 AM  BP/Weight  Systolic BP 725 366 440 347 425 956 387  Diastolic BP 90 92 82 90 82 82 90  Wt. (Lbs)  226.04  225.12 221.04  225.4  BMI  31.53 kg/m2  31.4 kg/m2 30.83 kg/m2  31.44 kg/m2

## 2021-06-12 NOTE — Patient Instructions (Addendum)
F/u in 8 to 9  weeks , re evaluate blood pressure  You are describing amnesia, where you totally forget chunks of time , this is very concerning  I will refer you for brain a scan and to Neurology for further evaluation  Blood pressure is high, additional medication , spironolactone  25 mg one daily is added, and at your pharmacy, continue amlodipine as before  Fasting lipid, cmp and EGFR  1 week before next visit  Thanks for choosing [facility], we consider it a privelige to serve you.

## 2021-06-16 DIAGNOSIS — R413 Other amnesia: Secondary | ICD-10-CM | POA: Insufficient documentation

## 2021-06-16 NOTE — Assessment & Plan Note (Signed)
Uncontrolled, add spironolactone DASH diet and commitment to daily physical activity for a minimum of 30 minutes discussed and encouraged, as a part of hypertension management. The importance of attaining a healthy weight is also discussed.     06/12/2021    2:55 PM 06/12/2021    1:31 PM 06/06/2021    4:58 PM 06/06/2021    4:54 PM 05/01/2021   11:30 AM 04/28/2021   11:35 AM 04/24/2021   10:32 AM  BP/Weight  Systolic BP 848 592 763 943 200 379 444  Diastolic BP 90 92 82 90 82 82 90  Wt. (Lbs)  226.04  225.12 221.04  225.4  BMI  31.53 kg/m2  31.4 kg/m2 30.83 kg/m2  31.44 kg/m2

## 2021-06-16 NOTE — Assessment & Plan Note (Addendum)
Recurrent episodes of amnesia in past several months, brain scan and Neurology eval, has normal MMSE

## 2021-06-25 NOTE — Progress Notes (Deleted)
Cardiology Office Note   Date:  06/25/2021   ID:  Evan Jacobson, DOB Mar 11, 1965, MRN [id number]  PCP:  Fayrene Helper, MD  Cardiologist:   Rozann Lesches, MD Referring:  ***  No chief complaint on file.     History of Present Illness: Evan Jacobson is a 56 y.o. male who presents for ***    He saw Dr. Domenic Polite in 2021 for chest pain and had minimal coronary plaque on CT   Past Medical History:  Diagnosis Date   Allergic rhinitis    Bipolar disorder ([place]) 08/25/2011   Chronic back pain    Concussion 2012   GERD (gastroesophageal reflux disease)    Groin pain, chronic, right 06/28/2014   Heart palpitations 06/18/2016   Hypertension    Persistent adjustment disorder 12/05/2015   Chronic adjustment disorder diagnosed by [facility] and counseling recomended   PTSD (post-traumatic stress disorder)    Sinusitis    Skin lump of leg, left 11/17/2017   Vertigo 01/08/2017    Past Surgical History:  Procedure Laterality Date   COLONOSCOPY N/A 08/12/2017   Procedure: COLONOSCOPY;  Surgeon: Rogene Houston, MD;  Location: [facility];  Service: Endoscopy;  Laterality: N/A;  730   NO PAST SURGERIES       Current Outpatient Medications  Medication Sig Dispense Refill   amLODipine (NORVASC) 10 MG tablet TAKE 1 TABLET BY MOUTH EVERY DAY 90 tablet 1   anastrozole (ARIMIDEX) 1 MG tablet Take 1 tablet (1 mg total) by mouth daily. 90 tablet 3   cetirizine (ZYRTEC) 10 MG tablet Take 10 mg by mouth daily.     clomiPHENE (CLOMID) 50 MG tablet Take 0.5 tablets (25 mg total) by mouth daily. 45 tablet 3   EPINEPHrine 0.3 mg/0.3 mL IJ SOAJ injection Inject 0.3 mg into the muscle once.      fluticasone (FLONASE) 50 MCG/ACT nasal spray Place 2 sprays into both nostrils daily. 16 g 6   Krill Oil 500 MG CAPS Take 500 mg by mouth daily.      Multiple Vitamin (MULTIVITAMIN WITH MINERALS) TABS tablet Take 2 tablets by mouth daily.      sildenafil (VIAGRA) 100 MG tablet Take 1 tablet (100 mg  total) by mouth as needed for erectile dysfunction. 10 tablet 6   spironolactone (ALDACTONE) 25 MG tablet Take 1 tablet (25 mg total) by mouth daily. 30 tablet 2   tiZANidine (ZANAFLEX) 4 MG tablet Take 1 tablet (4 mg total) by mouth at bedtime. 30 tablet 0   No current facility-administered medications for this visit.    Allergies:   Sulfa antibiotics, Ace inhibitors, No known allergies, Sulfasalazine, Sulfonamide derivatives, and Sulfamethoxazole-trimethoprim    ROS:  Please see the history of present illness.   Otherwise, review of systems are positive for {NONE DEFAULTED:18576}.   All other systems are reviewed and negative.    PHYSICAL EXAM: VS:  There were no vitals taken for this visit. , BMI There is no height or weight on file to calculate BMI. GENERAL:  Well appearing NECK:  No jugular venous distention, waveform within normal limits, carotid upstroke brisk and symmetric, no bruits, no thyromegaly LUNGS:  Clear to auscultation bilaterally CHEST:  Unremarkable HEART:  PMI not displaced or sustained,S1 and S2 within normal limits, no S3, no S4, no clicks, no rubs, *** murmurs ABD:  Flat, positive bowel sounds normal in frequency in pitch, no bruits, no rebound, no guarding, no midline pulsatile mass, no hepatomegaly,  no splenomegaly EXT:  2 plus pulses throughout, no edema, no cyanosis no clubbing   ***GENERAL:  Well appearing HEENT:  Pupils equal round and reactive, fundi not visualized, oral mucosa unremarkable NECK:  No jugular venous distention, waveform within normal limits, carotid upstroke brisk and symmetric, no bruits, no thyromegaly LYMPHATICS:  No cervical, inguinal adenopathy LUNGS:  Clear to auscultation bilaterally BACK:  No CVA tenderness CHEST:  Unremarkable HEART:  PMI not displaced or sustained,S1 and S2 within normal limits, no S3, no S4, no clicks, no rubs, *** murmurs ABD:  Flat, positive bowel sounds normal in frequency in pitch, no bruits, no rebound, no  guarding, no midline pulsatile mass, no hepatomegaly, no splenomegaly EXT:  2 plus pulses throughout, no edema, no cyanosis no clubbing SKIN:  No rashes no nodules NEURO:  Cranial nerves II through XII grossly intact, motor grossly intact throughout PSYCH:  Cognitively intact, oriented to person place and time    EKG:  EKG {ACTION; IS/IS KGM:[id number]} ordered today. The ekg ordered today demonstrates ***   Recent Labs: 12/05/2020: Platelets 165 03/25/2021: ALT 26; BUN 13; Creatinine, Ser 1.32; Hemoglobin 16.4; Potassium 4.2; Sodium 140    Lipid Panel    Component Value Date/Time   CHOL 185 06/04/2020 0805   TRIG 63 06/04/2020 0805   HDL 53 06/04/2020 0805   CHOLHDL 3.5 06/04/2020 0805   CHOLHDL 2.3 06/03/2019 0900   VLDL 14 06/18/2016 0936   LDLCALC 120 (H) 06/04/2020 0805   LDLCALC 84 06/03/2019 0900      Wt Readings from Last 3 Encounters:  06/12/21 226 lb 0.6 oz (102.5 kg)  06/06/21 225 lb 1.9 oz (102.1 kg)  05/01/21 221 lb 0.6 oz (100.3 kg)      Other studies Reviewed: Additional studies/ records that were reviewed today include: ***. Review of the above records demonstrates:  Please see elsewhere in the note.  ***   ASSESSMENT AND PLAN:  CHEST PAIN:  ***  HTN:  ***    Current medicines are reviewed at length with the patient today.  The patient {ACTIONS; HAS/DOES NOT HAVE:19233} concerns regarding medicines.  The following changes have been made:  {PLAN; NO CHANGE:13088:s}  Labs/ tests ordered today include: *** No orders of the defined types were placed in this encounter.    Disposition:   FU with ***    Signed, Minus Breeding, MD  06/25/2021 6:59 AM    [place]

## 2021-06-26 ENCOUNTER — Ambulatory Visit: Payer: 59 | Admitting: Cardiology

## 2021-06-26 DIAGNOSIS — R072 Precordial pain: Secondary | ICD-10-CM

## 2021-06-26 DIAGNOSIS — I10 Essential (primary) hypertension: Secondary | ICD-10-CM

## 2021-07-01 ENCOUNTER — Encounter: Payer: Self-pay | Admitting: Cardiology

## 2021-07-04 ENCOUNTER — Ambulatory Visit (HOSPITAL_COMMUNITY): Admission: RE | Admit: 2021-07-04 | Payer: 59 | Source: Ambulatory Visit

## 2021-07-09 ENCOUNTER — Encounter: Payer: Self-pay | Admitting: Neurology

## 2021-07-09 ENCOUNTER — Ambulatory Visit: Payer: 59 | Admitting: Neurology

## 2021-08-14 ENCOUNTER — Ambulatory Visit (INDEPENDENT_AMBULATORY_CARE_PROVIDER_SITE_OTHER): Payer: 59 | Admitting: Family Medicine

## 2021-08-14 ENCOUNTER — Encounter: Payer: Self-pay | Admitting: Family Medicine

## 2021-08-14 VITALS — BP 148/96 | HR 87 | Resp 16 | Ht 71.0 in | Wt 227.0 lb

## 2021-08-14 DIAGNOSIS — I10 Essential (primary) hypertension: Secondary | ICD-10-CM

## 2021-08-14 DIAGNOSIS — E669 Obesity, unspecified: Secondary | ICD-10-CM | POA: Diagnosis not present

## 2021-08-14 DIAGNOSIS — E66811 Obesity, class 1: Secondary | ICD-10-CM

## 2021-08-14 DIAGNOSIS — M5410 Radiculopathy, site unspecified: Secondary | ICD-10-CM

## 2021-08-14 DIAGNOSIS — Z125 Encounter for screening for malignant neoplasm of prostate: Secondary | ICD-10-CM

## 2021-08-14 DIAGNOSIS — Z1159 Encounter for screening for other viral diseases: Secondary | ICD-10-CM | POA: Diagnosis not present

## 2021-08-14 MED ORDER — SPIRONOLACTONE 50 MG PO TABS: 50.0000 mg | ORAL_TABLET | Freq: Every day | ORAL | 2 refills | Status: DC

## 2021-08-14 NOTE — Patient Instructions (Addendum)
Annual exam and re evaluate blood pressure I 8 to 10 weeks, call if you need me sooner  Higher dose spironolactone 50 mg daily, start today, continue amlodipine 10 mg one daily as before  Fasting lipid, cmp and eGFR, pSA, TSH hep C screen 5 days before next appointment  It is important that you exercise regularly at least 30 minutes 5 times a week. If you develop chest pain, have severe difficulty breathing, or feel very tired, stop exercising immediately and seek medical attention Keep up great exercise habits  Continue to modify diet by eating less and less often  Thanks for choosing [facility], we consider it a privelige to serve you.   Please provide pt ed info on blood pressure to pt at checkout and review briefly

## 2021-08-15 ENCOUNTER — Encounter: Payer: Self-pay | Admitting: Family Medicine

## 2021-08-15 NOTE — Assessment & Plan Note (Signed)
Uncontrolled, not at goal, increase spironolactone and re eval in 8 weeks DASH diet and commitment to daily physical activity for a minimum of 30 minutes discussed and encouraged, as a part of hypertension management. The importance of attaining a healthy weight is also discussed.     08/14/2021    9:28 AM 08/14/2021    9:10 AM 06/12/2021    2:55 PM 06/12/2021    1:31 PM 06/06/2021    4:58 PM 06/06/2021    4:54 PM 05/01/2021   11:30 AM  BP/Weight  Systolic BP 834 373 578 978 478 412 820  Diastolic BP 96 78 90 92 82 90 82  Wt. (Lbs)  227  226.04  225.12 221.04  BMI  31.66 kg/m2  31.53 kg/m2  31.4 kg/m2 30.83 kg/m2

## 2021-08-15 NOTE — Assessment & Plan Note (Signed)
  Patient re-educated about  the importance of commitment to a  minimum of 150 minutes of exercise per week as able.  The importance of healthy food choices with portion control discussed, as well as eating regularly and within a 12 hour window most days. The need to choose "clean , green" food 50 to 75% of the time is discussed, as well as to make water the primary drink and set a goal of 64 ounces water daily.       08/14/2021    9:10 AM 06/12/2021    1:31 PM 06/06/2021    4:54 PM  Weight /BMI  Weight 227 lb 226 lb 0.6 oz 225 lb 1.9 oz  Height '5\' 11"'$  (1.803 m) '5\' 11"'$  (1.803 m) '5\' 11"'$  (1.803 m)  BMI 31.66 kg/m2 31.53 kg/m2 31.4 kg/m2

## 2021-08-15 NOTE — Progress Notes (Signed)
Evan Jacobson     MRN: [id number]      DOB: 1965-02-28   HPI Evan Jacobson is here for follow up and re-evaluation of chronic medical conditions, medication management and review of any available recent lab and radiology data.  Preventive health is updated, specifically  Cancer screening and Immunization.   Questions or concerns regarding consultations or procedures which the PT has had in the interim are  addressed. The PT denies any adverse reactions to current medications since the last visit.  There are no new concerns.  There are no specific complaints   ROS Denies recent fever or chills. Denies sinus pressure, nasal congestion, ear pain or sore throat. Denies chest congestion, productive cough or wheezing. Denies chest pains, palpitations and leg swelling Denies abdominal pain, nausea, vomiting,diarrhea or constipation.   Denies dysuria, frequency, hesitancy or incontinence. Denies joint pain, swelling and limitation in mobility. Denies headaches, seizures, numbness, or tingling. Denies depression, anxiety or insomnia. Denies skin break down or rash.   PE  BP (!) 148/96   Pulse 87   Resp 16   Ht '5\' 11"'$  (1.803 m)   Wt 227 lb (103 kg)   SpO2 98%   BMI 31.66 kg/m   Patient alert and oriented and in no cardiopulmonary distress.  HEENT: No facial asymmetry, EOMI,     Neck supple .  Chest: Clear to auscultation bilaterally.  CVS: S1, S2 no murmurs, no S3.Regular rate.  ABD: Soft non tender.   Ext: No edema  MS: Adequate ROM spine, shoulders, hips and knees.  Skin: Intact, no ulcerations or rash noted.  Psych: Good eye contact, normal affect. Memory intact not anxious or depressed appearing.  CNS: CN 2-12 intact, power,  normal throughout.no focal deficits noted.   Assessment & Plan  Essential hypertension Uncontrolled, not at goal, increase spironolactone and re eval in 8 weeks DASH diet and commitment to daily physical activity for a minimum of 30  minutes discussed and encouraged, as a part of hypertension management. The importance of attaining a healthy weight is also discussed.     08/14/2021    9:28 AM 08/14/2021    9:10 AM 06/12/2021    2:55 PM 06/12/2021    1:31 PM 06/06/2021    4:58 PM 06/06/2021    4:54 PM 05/01/2021   11:30 AM  BP/Weight  Systolic BP 952 841 324 401 027 253 664  Diastolic BP 96 78 90 92 82 90 82  Wt. (Lbs)  227  226.04  225.12 221.04  BMI  31.66 kg/m2  31.53 kg/m2  31.4 kg/m2 30.83 kg/m2       Obesity (BMI 30.0-34.9)  Patient re-educated about  the importance of commitment to a  minimum of 150 minutes of exercise per week as able.  The importance of healthy food choices with portion control discussed, as well as eating regularly and within a 12 hour window most days. The need to choose "clean , green" food 50 to 75% of the time is discussed, as well as to make water the primary drink and set a goal of 64 ounces water daily.       08/14/2021    9:10 AM 06/12/2021    1:31 PM 06/06/2021    4:54 PM  Weight /BMI  Weight 227 lb 226 lb 0.6 oz 225 lb 1.9 oz  Height '5\' 11"'$  (1.803 m) '5\' 11"'$  (1.803 m) '5\' 11"'$  (1.803 m)  BMI 31.66 kg/m2 31.53 kg/m2 31.4 kg/m2  Back pain with left-sided radiculopathy Chronic and unchanged, being managed through the [place]

## 2021-08-15 NOTE — Assessment & Plan Note (Signed)
Chronic and unchanged, being managed through the [place]

## 2021-08-30 ENCOUNTER — Emergency Department (HOSPITAL_COMMUNITY): Payer: No Typology Code available for payment source

## 2021-08-30 ENCOUNTER — Emergency Department (HOSPITAL_COMMUNITY)
Admission: EM | Admit: 2021-08-30 | Discharge: 2021-08-30 | Disposition: A | Discharge status: Home / Self Care | Payer: No Typology Code available for payment source | Attending: Emergency Medicine | Admitting: Emergency Medicine

## 2021-08-30 ENCOUNTER — Encounter (HOSPITAL_COMMUNITY): Payer: Self-pay | Admitting: Emergency Medicine

## 2021-08-30 ENCOUNTER — Other Ambulatory Visit: Payer: Self-pay

## 2021-08-30 DIAGNOSIS — R0602 Shortness of breath: Secondary | ICD-10-CM | POA: Insufficient documentation

## 2021-08-30 DIAGNOSIS — R079 Chest pain, unspecified: Secondary | ICD-10-CM | POA: Diagnosis present

## 2021-08-30 DIAGNOSIS — I10 Essential (primary) hypertension: Secondary | ICD-10-CM | POA: Diagnosis not present

## 2021-08-30 DIAGNOSIS — Z79899 Other long term (current) drug therapy: Secondary | ICD-10-CM | POA: Diagnosis not present

## 2021-08-30 DIAGNOSIS — R091 Pleurisy: Secondary | ICD-10-CM | POA: Insufficient documentation

## 2021-08-30 LAB — CBC
HCT: 43.3 % (ref 39.0–52.0)
Hemoglobin: 14.5 g/dL (ref 13.0–17.0)
MCH: 29.9 pg (ref 26.0–34.0)
MCHC: 33.5 g/dL (ref 30.0–36.0)
MCV: 89.3 fL (ref 80.0–100.0)
Platelets: 226 10*3/uL (ref 150–400)
RBC: 4.85 MIL/uL (ref 4.22–5.81)
RDW: 12.4 % (ref 11.5–15.5)
WBC: 4.5 10*3/uL (ref 4.0–10.5)
nRBC: 0 % (ref 0.0–0.2)

## 2021-08-30 LAB — BASIC METABOLIC PANEL
Anion gap: 5 (ref 5–15)
BUN: 18 mg/dL (ref 6–20)
CO2: 26 mmol/L (ref 22–32)
Calcium: 8.7 mg/dL — ABNORMAL LOW (ref 8.9–10.3)
Chloride: 105 mmol/L (ref 98–111)
Creatinine, Ser: 1.23 mg/dL (ref 0.61–1.24)
GFR, Estimated: >60 mL/min (ref >60)
Glucose, Bld: 94 mg/dL (ref 70–99)
Potassium: 3.7 mmol/L (ref 3.5–5.1)
Sodium: 136 mmol/L (ref 135–145)

## 2021-08-30 LAB — TROPONIN I (HIGH SENSITIVITY): Troponin I (High Sensitivity): 2 ng/L (ref <18)

## 2021-08-30 LAB — D-DIMER, QUANTITATIVE: D-Dimer, Quant: <0.27 ug/mL-FEU (ref 0.00–0.50)

## 2021-08-30 MED ORDER — NAPROXEN 500 MG PO TABS: 500.0000 mg | ORAL_TABLET | Freq: Two times a day (BID) | ORAL | 0 refills | Status: DC

## 2021-08-30 MED ORDER — ASPIRIN 81 MG PO CHEW
324.0000 mg | CHEWABLE_TABLET | Freq: Once | ORAL | Status: AC
Start: 2021-08-30 — End: 2021-08-30
  Administered 2021-08-30: 324 mg via ORAL
  Filled 2021-08-30: qty 4

## 2021-08-30 NOTE — ED Triage Notes (Signed)
Pt c/o chest pain that started last night. He states when he moves, coughs, sneezes or takes a deep breath it hurts the left side of chest.

## 2021-08-30 NOTE — ED Provider Notes (Signed)
[facility] EMERGENCY DEPARTMENT Provider Note   CSN: [id number] Arrival date & time: 08/30/21  2147     History  Chief Complaint  Patient presents with   Chest Pain    Evan Jacobson is a 56 y.o. male.   Chest Pain  This patient is a 56 year old male, he has a history of hypertension on Norvasc, he is also on Clomid, on Viagra, he is on Aldactone.  He presents to the hospital today with a complaint of left-sided chest pain which has started at approximately 1:00 in the morning last night.  It has been about 21 hours, it is intermittent, it is worse with trying to take a deep breath, or sneezing, he has pain in the left side of the chest which is sharp and stabbing.  He has no fevers or chills, no hemoptysis, no coughing.  He reports no swelling of his legs no recent travel no surgery no history of DVTs.  He does not smoke cigarettes.  He does have a history of hypertension.  The patient has exertional shortness of breath with this as well, he states he was trying to walk up the stairs but was very short of breath with it today.    Home Medications Prior to Admission medications   Medication Sig Start Date End Date Taking? Authorizing Provider  naproxen (NAPROSYN) 500 MG tablet Take 1 tablet (500 mg total) by mouth 2 (two) times daily with a meal. 08/30/21  Yes Noemi Chapel, MD  amLODipine (NORVASC) 10 MG tablet TAKE 1 TABLET BY MOUTH EVERY DAY 06/03/21   Fayrene Helper, MD  anastrozole (ARIMIDEX) 1 MG tablet Take 1 tablet (1 mg total) by mouth daily. Patient not taking: Reported on 08/14/2021 04/08/21   Cleon Gustin, MD  cetirizine (ZYRTEC) 10 MG tablet Take 10 mg by mouth daily.    [provider]  clomiPHENE (CLOMID) 50 MG tablet Take 0.5 tablets (25 mg total) by mouth daily. Patient not taking: Reported on 08/14/2021 04/08/21   Cleon Gustin, MD  EPINEPHrine 0.3 mg/0.3 mL IJ SOAJ injection Inject 0.3 mg into the muscle once.  07/20/17   [provider]  sildenafil (VIAGRA) 100 MG tablet Take 1 tablet (100 mg total) by mouth as needed for erectile dysfunction. 04/08/21   McKenzie, Candee Furbish, MD  spironolactone (ALDACTONE) 50 MG tablet Take 1 tablet (50 mg total) by mouth daily. 08/14/21   Fayrene Helper, MD  tiZANidine (ZANAFLEX) 4 MG tablet Take 1 tablet (4 mg total) by mouth at bedtime. 04/28/21   Jaynee Eagles, PA-C      Allergies    Sulfa antibiotics, Ace inhibitors, No known allergies, Sulfasalazine, Sulfonamide derivatives, and Sulfamethoxazole-trimethoprim    Review of Systems   Review of Systems  Cardiovascular:  Positive for chest pain.  All other systems reviewed and are negative.   Physical Exam Updated Vital Signs BP 123/80   Pulse 78   Temp 98.5 F (36.9 C) (Oral)   Resp (!) 23   Ht 1.803 m ('5\' 11"'$ )   Wt 99.8 kg   SpO2 98%   BMI 30.68 kg/m  Physical Exam Vitals and nursing note reviewed.  Constitutional:      General: He is not in acute distress.    Appearance: He is well-developed.  HENT:     Head: Normocephalic and atraumatic.     Mouth/Throat:     Pharynx: No oropharyngeal exudate.  Eyes:     General: No scleral icterus.  Right eye: No discharge.        Left eye: No discharge.     Conjunctiva/sclera: Conjunctivae normal.     Pupils: Pupils are equal, round, and reactive to light.  Neck:     Thyroid: No thyromegaly.     Vascular: No JVD.  Cardiovascular:     Rate and Rhythm: Normal rate and regular rhythm.     Heart sounds: Normal heart sounds. No murmur heard.    No friction rub. No gallop.  Pulmonary:     Effort: Pulmonary effort is normal. No respiratory distress.     Breath sounds: Normal breath sounds. No wheezing or rales.  Chest:     Chest wall: No tenderness.  Abdominal:     General: Bowel sounds are normal. There is no distension.     Palpations: Abdomen is soft. There is no mass.     Tenderness: There is no abdominal tenderness.  Musculoskeletal:        General: No  tenderness. Normal range of motion.     Cervical back: Normal range of motion and neck supple.     Right lower leg: No tenderness. No edema.     Left lower leg: No tenderness. No edema.  Lymphadenopathy:     Cervical: No cervical adenopathy.  Skin:    General: Skin is warm and dry.     Findings: No erythema or rash.  Neurological:     Mental Status: He is alert.     Coordination: Coordination normal.  Psychiatric:        Behavior: Behavior normal.     ED Results / Procedures / Treatments   Labs (all labs ordered are listed, but only abnormal results are displayed) Labs Reviewed  BASIC METABOLIC PANEL - Abnormal; Notable for the following components:      Result Value   Calcium 8.7 (*)    All other components within normal limits  D-DIMER, QUANTITATIVE  CBC  TROPONIN I (HIGH SENSITIVITY)    EKG EKG Interpretation  Date/Time:  Friday August 30 2021 21:56:45 EDT Ventricular Rate:  80 PR Interval:  137 QRS Duration: 88 QT Interval:  400 QTC Calculation: 462 R Axis:   55 Text Interpretation: Sinus rhythm Consider right atrial enlargement Minimal ST depression, inferior leads since last tracing no significant change Confirmed by Noemi Chapel [phone]) on 08/30/2021 10:05:10 PM  Radiology DG Chest Port 1 View  Result Date: 08/30/2021 CLINICAL DATA:  Hervey Ard left-sided chest pain EXAM: PORTABLE CHEST 1 VIEW COMPARISON:  Radiographs 03/12/2015 FINDINGS: No focal consolidation, pleural effusion, or pneumothorax. Heart size upper limits of normal. No acute osseous abnormality. IMPRESSION: No acute abnormality. Electronically Signed   By: Placido Sou M.D.   On: 08/30/2021 22:31    Procedures Procedures    Medications Ordered in ED Medications  aspirin chewable tablet 324 mg (324 mg Oral Given 08/30/21 2245)    ED Course/ Medical Decision Making/ A&P                           Medical Decision Making Amount and/or Complexity of Data Reviewed Labs: ordered. Radiology:  ordered.  Risk OTC drugs. Prescription drug management.   This patient presents to the ED for concern of chest pain, this involves an extensive number of treatment options, and is a complaint that carries with it a high risk of complications and morbidity.  The differential diagnosis includes pleurisy, pneumothorax, pulmonary embolism, cardiac obstructive disease   Co  morbidities that complicate the patient evaluation  Hypertension   Additional history obtained:  Additional history obtained from electronic medical record External records from outside source obtained and reviewed including office visits for hypertension, back pain, no issues with breathing or cardiac disease   Lab Tests:  I Ordered, and personally interpreted labs.  The pertinent results include: CBC metabolic panel troponin and D-dimer, they show normal D-dimer, normal troponin, unremarkable CBC and metabolic panel   Imaging Studies ordered:  I ordered imaging studies including portable chest x-ray I independently visualized and interpreted imaging which showed no acute finding I agree with the radiologist interpretation   Cardiac Monitoring: / EKG:  The patient was maintained on a cardiac monitor.  I personally viewed and interpreted the cardiac monitored which showed an underlying rhythm of: Normal sinus rhythm   Problem List / ED Course / Critical interventions / Medication management  Patient is well-appearing, likely pleurisy without any signs of pulmonary embolism for which she is low risk and has a negative D-dimer, negative troponin, unremarkable EKG, patient agreeable to discharge home on an anti-inflammatory I ordered medication including anti-inflammatories for chest pain and pleurisy Reevaluation of the patient after these medicines showed that the patient stable I have reviewed the patients home medicines and have made adjustments as needed   Social Determinants of Health:  Stable for  discharge, no acute historical findings that would be social determinants of health   Test / Admission - Considered:  Considered admission but the patient is negative for coronary disease findings both historically and on labs and EKG Considered pulmonary embolism but D-dimer is negative, stable for discharge  I have discussed with the patient at the bedside the results, and the meaning of these results.  They have expressed her understanding to the need for follow-up with primary care physician        Final Clinical Impression(s) / ED Diagnoses Final diagnoses:  Pleurisy    Rx / DC Orders ED Discharge Orders          Ordered    naproxen (NAPROSYN) 500 MG tablet  2 times daily with meals        08/30/21 2252              Noemi Chapel, MD 08/30/21 2254

## 2021-08-30 NOTE — Discharge Instructions (Signed)
Please take Naprosyn, '500mg'$  by mouth twice daily as needed for pain - this in an antiinflammatory medicine (NSAID) and is similar to ibuprofen - many people feel that it is stronger than ibuprofen and it is easier to take since it is a smaller pill.  Please use this only for 1 week - if your pain persists, you will need to follow up with your doctor in the office for ongoing guidance and pain control.  Thank you for allowing [place] to treat you in the emergency department today.  After reviewing your examination and potential testing that was done it appears that you are safe to go home.  I would like for you to follow-up with your doctor within the next several days, have them obtain your results and follow-up with them to review all of these tests.  If you should develop severe or worsening symptoms return to the emergency department immediately  Thankfully all of your tests were normal, no signs of heart attack, no signs of blood clot your x-ray of the chest was normal as well.  Please read the attached instructions regarding pleurisy

## 2021-09-07 ENCOUNTER — Other Ambulatory Visit: Payer: Self-pay | Admitting: Family Medicine

## 2021-09-19 ENCOUNTER — Telehealth: Payer: Self-pay

## 2021-09-19 ENCOUNTER — Other Ambulatory Visit: Payer: Self-pay

## 2021-09-19 DIAGNOSIS — N5201 Erectile dysfunction due to arterial insufficiency: Secondary | ICD-10-CM

## 2021-09-19 MED ORDER — SILDENAFIL CITRATE 100 MG PO TABS: 100.0000 mg | ORAL_TABLET | ORAL | 6 refills | Status: DC | PRN

## 2021-09-19 NOTE — Telephone Encounter (Signed)
Rx sent to [facility] with 6 refills.

## 2021-09-19 NOTE — Telephone Encounter (Signed)
Patient called advising he needed a refill on sildenafil (VIAGRA) 100 MG tablet sent to  [place], [place] [address]   Patient also advised that [place] pharmacy [id number] [place] clomiPHENE (CLOMID) 50 MG tablet is still out of stock. He wanted to know if we could send it to another pharmacy.    Thank you

## 2021-10-07 ENCOUNTER — Other Ambulatory Visit: Payer: No Typology Code available for payment source

## 2021-10-08 LAB — CBC WITH DIFFERENTIAL
Basophils Absolute: 0.1 10*3/uL (ref 0.0–0.2)
Basos: 1 %
EOS (ABSOLUTE): 0.5 10*3/uL — ABNORMAL HIGH (ref 0.0–0.4)
Eos: 12 %
Hematocrit: 44.2 % (ref 37.5–51.0)
Hemoglobin: 14.7 g/dL (ref 13.0–17.7)
Immature Grans (Abs): 0 10*3/uL (ref 0.0–0.1)
Immature Granulocytes: 0 %
Lymphocytes Absolute: 1.9 10*3/uL (ref 0.7–3.1)
Lymphs: 41 %
MCH: 29.5 pg (ref 26.6–33.0)
MCHC: 33.3 g/dL (ref 31.5–35.7)
MCV: 89 fL (ref 79–97)
Monocytes Absolute: 0.4 10*3/uL (ref 0.1–0.9)
Monocytes: 8 %
Neutrophils Absolute: 1.7 10*3/uL (ref 1.4–7.0)
Neutrophils: 38 %
RBC: 4.99 x10E6/uL (ref 4.14–5.80)
RDW: 12.5 % (ref 11.6–15.4)
WBC: 4.5 10*3/uL (ref 3.4–10.8)

## 2021-10-08 LAB — COMPREHENSIVE METABOLIC PANEL
ALT: 23 IU/L (ref 0–44)
AST: 18 IU/L (ref 0–40)
Albumin/Globulin Ratio: 1.4 (ref 1.2–2.2)
Albumin: 4.2 g/dL (ref 3.8–4.9)
Alkaline Phosphatase: 74 IU/L (ref 44–121)
BUN/Creatinine Ratio: 13 (ref 9–20)
BUN: 16 mg/dL (ref 6–24)
Bilirubin Total: 0.2 mg/dL (ref 0.0–1.2)
CO2: 22 mmol/L (ref 20–29)
Calcium: 9.6 mg/dL (ref 8.7–10.2)
Chloride: 105 mmol/L (ref 96–106)
Creatinine, Ser: 1.23 mg/dL (ref 0.76–1.27)
Globulin, Total: 3.1 g/dL (ref 1.5–4.5)
Glucose: 78 mg/dL (ref 70–99)
Potassium: 4.8 mmol/L (ref 3.5–5.2)
Sodium: 142 mmol/L (ref 134–144)
Total Protein: 7.3 g/dL (ref 6.0–8.5)
eGFR: 70 mL/min/{1.73_m2} (ref >59)

## 2021-10-08 LAB — ESTRADIOL: Estradiol: 35.2 pg/mL (ref 7.6–42.6)

## 2021-10-09 ENCOUNTER — Ambulatory Visit: Payer: No Typology Code available for payment source | Admitting: Urology

## 2021-10-13 LAB — TESTOSTERONE,FREE AND TOTAL
Testosterone, Free: 4.7 pg/mL — ABNORMAL LOW (ref 7.2–24.0)
Testosterone: 139 ng/dL — ABNORMAL LOW (ref 264–916)

## 2021-10-15 ENCOUNTER — Ambulatory Visit: Payer: No Typology Code available for payment source | Admitting: Family Medicine

## 2021-10-18 ENCOUNTER — Ambulatory Visit: Payer: 59 | Admitting: Family Medicine

## 2021-10-19 ENCOUNTER — Other Ambulatory Visit: Payer: Self-pay | Admitting: Family Medicine

## 2021-10-21 ENCOUNTER — Telehealth: Payer: Self-pay

## 2021-10-21 NOTE — Telephone Encounter (Signed)
Patient called with questions regarding his estradiol and testosterone levels.  I informed him of his result numbers based on his labs reviewed by Dr. Alyson Ingles, his concern was his new symptoms he has been experiencing for the last month.  He will speak with Dr. Alyson Ingles at his scheduled visit with his concerns and to go over labs in more detail.

## 2021-11-06 ENCOUNTER — Encounter: Payer: Self-pay | Admitting: Urology

## 2021-11-06 ENCOUNTER — Ambulatory Visit (INDEPENDENT_AMBULATORY_CARE_PROVIDER_SITE_OTHER): Payer: 59 | Admitting: Urology

## 2021-11-06 VITALS — BP 124/78 | HR 80

## 2021-11-06 DIAGNOSIS — R7989 Other specified abnormal findings of blood chemistry: Secondary | ICD-10-CM

## 2021-11-06 DIAGNOSIS — N5201 Erectile dysfunction due to arterial insufficiency: Secondary | ICD-10-CM | POA: Diagnosis not present

## 2021-11-06 DIAGNOSIS — E291 Testicular hypofunction: Secondary | ICD-10-CM | POA: Diagnosis not present

## 2021-11-06 MED ORDER — SILDENAFIL CITRATE 100 MG PO TABS: 100.0000 mg | ORAL_TABLET | ORAL | 6 refills | Status: DC | PRN

## 2021-11-06 MED ORDER — JATENZO 158 MG PO CAPS: 1.0000 | ORAL_CAPSULE | Freq: Two times a day (BID) | ORAL | 5 refills | Status: DC

## 2021-11-06 NOTE — Patient Instructions (Signed)
Erectile Dysfunction ?Erectile dysfunction (ED) is the inability to get or keep an erection in order to have sexual intercourse. ED is considered a symptom of an underlying disorder and is not considered a disease. ED may include: ?Inability to get an erection. ?Lack of enough hardness of the erection to allow penetration. ?Loss of erection before sex is finished. ?What are the causes? ?This condition may be caused by: ?Physical causes, such as: ?Artery problems. This may include heart disease, high blood pressure, atherosclerosis, and diabetes. ?Hormonal problems, such as low testosterone. ?Obesity. ?Nerve problems. This may include back or pelvic injuries, multiple sclerosis, Parkinson's disease, spinal cord injury, and stroke. ?Certain medicines, such as: ?Pain relievers. ?Antidepressants. ?Blood pressure medicines and water pills (diuretics). ?Cancer medicines. ?Antihistamines. ?Muscle relaxants. ?Lifestyle factors, such as: ?Use of drugs such as marijuana, cocaine, or opioids. ?Excessive use of alcohol. ?Smoking. ?Lack of physical activity or exercise. ?Psychological causes, such as: ?Anxiety or stress. ?Sadness or depression. ?Exhaustion. ?Fear about sexual performance. ?Guilt. ?What are the signs or symptoms? ?Symptoms of this condition include: ?Inability to get an erection. ?Lack of enough hardness of the erection to allow penetration. ?Loss of the erection before sex is finished. ?Sometimes having normal erections, but with frequent unsatisfactory episodes. ?Low sexual satisfaction in either partner due to erection problems. ?A curved penis occurring with erection. The curve may cause pain, or the penis may be too curved to allow for intercourse. ?Never having nighttime or morning erections. ?How is this diagnosed? ?This condition is often diagnosed by: ?Performing a physical exam to find other diseases or specific problems with the penis. ?Asking you detailed questions about the problem. ?Doing tests,  such as: ?Blood tests to check for diabetes mellitus or high cholesterol, or to measure hormone levels. ?Other tests to check for underlying health conditions. ?An ultrasound exam to check for scarring. ?A test to check blood flow to the penis. ?Doing a sleep study at home to measure nighttime erections. ?How is this treated? ?This condition may be treated by: ?Medicines, such as: ?Medicine taken by mouth to help you achieve an erection (oral medicine). ?Hormone replacement therapy to replace low testosterone levels. ?Medicine that is injected into the penis. Your health care provider may instruct you how to give yourself these injections at home. ?Medicine that is delivered with a short applicator tube. The tube is inserted into the opening at the tip of the penis, which is the opening of the urethra. A tiny pellet of medicine is put in the urethra. The pellet dissolves and enhances erectile function. This is also called MUSE (medicated urethral system for erections) therapy. ?Vacuum pump. This is a pump with a ring on it. The pump and ring are placed on the penis and used to create pressure that helps the penis become erect. ?Penile implant surgery. In this procedure, you may receive: ?An inflatable implant. This consists of cylinders, a pump, and a reservoir. The cylinders can be inflated with a fluid that helps to create an erection, and they can be deflated after intercourse. ?A semi-rigid implant. This consists of two silicone rubber rods. The rods provide some rigidity. They are also flexible, so the penis can both curve downward in its normal position and become straight for sexual intercourse. ?Blood vessel surgery to improve blood flow to the penis. During this procedure, a blood vessel from a different part of the body is placed into the penis to allow blood to flow around (bypass) damaged or blocked blood vessels. ?Lifestyle changes,   such as exercising more, losing weight, and quitting smoking. ?Follow  these instructions at home: ?Medicines ? ?Take over-the-counter and prescription medicines only as told by your health care provider. Do not increase the dosage without first discussing it with your health care provider. ?If you are using self-injections, do injections as directed by your health care provider. Make sure you avoid any veins that are on the surface of the penis. After giving an injection, apply pressure to the injection site for 5 minutes. ?Talk to your health care provider about how to prevent headaches while taking ED medicines. These medicines may cause a sudden headache due to the increase in blood flow in your body. ?General instructions ?Exercise regularly, as directed by your health care provider. Work with your health care provider to lose weight, if needed. ?Do not use any products that contain nicotine or tobacco. These products include cigarettes, chewing tobacco, and vaping devices, such as e-cigarettes. If you need help quitting, ask your health care provider. ?Before using a vacuum pump, read the instructions that come with the pump and discuss any questions with your health care provider. ?Keep all follow-up visits. This is important. ?Contact a health care provider if: ?You feel nauseous. ?You are vomiting. ?You get sudden headaches while taking ED medicines. ?You have any concerns about your sexual health. ?Get help right away if: ?You are taking oral or injectable medicines and you have an erection that lasts longer than 4 hours. If your health care provider is unavailable, go to the nearest emergency room for evaluation. An erection that lasts much longer than 4 hours can result in permanent damage to your penis. ?You have severe pain in your groin or abdomen. ?You develop redness or severe swelling of your penis. ?You have redness spreading at your groin or lower abdomen. ?You are unable to urinate. ?You experience chest pain or a rapid heartbeat (palpitations) after taking oral  medicines. ?These symptoms may represent a serious problem that is an emergency. Do not wait to see if the symptoms will go away. Get medical help right away. Call your local emergency services (911 in the [place] Do not drive yourself to the hospital. ?Summary ?Erectile dysfunction (ED) is the inability to get or keep an erection during sexual intercourse. ?This condition is diagnosed based on a physical exam, your symptoms, and tests to determine the cause. Treatment varies depending on the cause and may include medicines, hormone therapy, surgery, or a vacuum pump. ?You may need follow-up visits to make sure that you are using your medicines or devices correctly. ?Get help right away if you are taking or injecting medicines and you have an erection that lasts longer than 4 hours. ?This information is not intended to replace advice given to you by your health care provider. Make sure you discuss any questions you have with your health care provider. ?Document Revised: 04/11/2020 Document Reviewed: 04/11/2020 ?Elsevier Patient Education ? 2023 Elsevier Inc. ? ?

## 2021-11-06 NOTE — Progress Notes (Signed)
11/06/2021 2:50 PM   Evan Jacobson 11-Oct-1965 [id number]  Referring provider: Fayrene Helper, MD [address], [place],  [place] [address]  Followup hypogonadism   HPI: Mr Hoying is a 56yo here for followup for hypogonadism and erectile dysfunction. He stopped clomid in April 2023 due to breast tenderness and inability to get the medication. Testosterone 134, hemoglobin 14.7. CMP normal. He has decreased libido. He uses sildenafil prn for his erectile dysfunction.    PMH: Past Medical History:  Diagnosis Date   Allergic rhinitis    Bipolar disorder ([place]) 08/25/2011   Chronic back pain    Concussion 2012   GERD (gastroesophageal reflux disease)    Groin pain, chronic, right 06/28/2014   Heart palpitations 06/18/2016   Hypertension    Persistent adjustment disorder 12/05/2015   Chronic adjustment disorder diagnosed by [facility] and counseling recomended   PTSD (post-traumatic stress disorder)    Sinusitis    Skin lump of leg, left 11/17/2017   Vertigo 01/08/2017    Surgical History: Past Surgical History:  Procedure Laterality Date   COLONOSCOPY N/A 08/12/2017   Procedure: COLONOSCOPY;  Surgeon: Rogene Houston, MD;  Location: [facility];  Service: Endoscopy;  Laterality: N/A;  730   NO PAST SURGERIES      Home Medications:  Allergies as of 11/06/2021       Reactions   Sulfa Antibiotics Anaphylaxis   Ace Inhibitors Other (See Comments)   Feels as though throat is closing up intermittently   No Known Allergies    Other reaction(s): MALE ERECTILE DISORDER   Sulfasalazine Other (See Comments)   Tongue swelled   Sulfonamide Derivatives Other (See Comments)   Tongue swelled   Sulfamethoxazole-trimethoprim Hives, Rash        Medication List        Accurate as of November 06, 2021  2:50 PM. If you have any questions, ask your nurse or doctor.          amLODipine 10 MG tablet Commonly known as: NORVASC TAKE 1 TABLET BY MOUTH EVERY DAY    anastrozole 1 MG tablet Commonly known as: ARIMIDEX Take 1 tablet (1 mg total) by mouth daily.   cetirizine 10 MG tablet Commonly known as: ZYRTEC Take 10 mg by mouth daily.   clomiPHENE 50 MG tablet Commonly known as: CLOMID Take 0.5 tablets (25 mg total) by mouth daily.   EPINEPHrine 0.3 mg/0.3 mL Soaj injection Commonly known as: EPI-PEN Inject 0.3 mg into the muscle once.   naproxen 500 MG tablet Commonly known as: Naprosyn Take 1 tablet (500 mg total) by mouth 2 (two) times daily with a meal.   sildenafil 100 MG tablet Commonly known as: VIAGRA Take 1 tablet (100 mg total) by mouth as needed for erectile dysfunction.   spironolactone 50 MG tablet Commonly known as: ALDACTONE TAKE 1 TABLET BY MOUTH EVERY DAY   tiZANidine 4 MG tablet Commonly known as: Zanaflex Take 1 tablet (4 mg total) by mouth at bedtime.        Allergies:  Allergies  Allergen Reactions   Sulfa Antibiotics Anaphylaxis   Ace Inhibitors Other (See Comments)    Feels as though throat is closing up intermittently   No Known Allergies     Other reaction(s): MALE ERECTILE DISORDER   Sulfasalazine Other (See Comments)    Tongue swelled    Sulfonamide Derivatives Other (See Comments)    Tongue swelled   Sulfamethoxazole-Trimethoprim Hives and Rash  Family History: Family History  Problem Relation Age of Onset   Stroke Father    Hypertension Father    Hypertension Mother        GAB   Heart disease Mother        Angina   Hypertension Sister    Hypertension Sister    Hypertension Brother    Hypertension Brother    Colon cancer Neg Hx     Social History:  reports that he has never smoked. He has never used smokeless tobacco. He reports current alcohol use. He reports that he does not use drugs.  ROS: All other review of systems were reviewed and are negative except what is noted above in HPI  Physical Exam: BP 124/78   Pulse 80   Constitutional:  Alert and oriented, No acute  distress. HEENT: [place] AT, moist mucus membranes.  Trachea midline, no masses. Cardiovascular: No clubbing, cyanosis, or edema. Respiratory: Normal respiratory effort, no increased work of breathing. GI: Abdomen is soft, nontender, nondistended, no abdominal masses GU: No CVA tenderness.  Lymph: No cervical or inguinal lymphadenopathy. Skin: No rashes, bruises or suspicious lesions. Neurologic: Grossly intact, no focal deficits, moving all 4 extremities. Psychiatric: Normal mood and affect.  Laboratory Data: Lab Results  Component Value Date   WBC 4.5 10/07/2021   HGB 14.7 10/07/2021   HCT 44.2 10/07/2021   MCV 89 10/07/2021   PLT 226 08/30/2021    Lab Results  Component Value Date   CREATININE 1.23 10/07/2021    Lab Results  Component Value Date   PSA 1.0 06/03/2019   PSA 1.2 07/22/2018   PSA 1.0 06/18/2016    Lab Results  Component Value Date   TESTOSTERONE 139 (L) 10/07/2021    Lab Results  Component Value Date   HGBA1C 5.1 04/24/2021    Urinalysis    Component Value Date/Time   COLORURINE YELLOW 07/28/2013 2028   APPEARANCEUR Clear 04/08/2021 1450   LABSPEC 1.015 07/28/2013 2028   PHURINE 6.0 07/28/2013 2028   GLUCOSEU Negative 04/08/2021 1450   HGBUR TRACE (A) 07/28/2013 2028   HGBUR negative 12/05/2008 1305   BILIRUBINUR Negative 04/08/2021 1450   KETONESUR NEGATIVE 07/28/2013 2028   PROTEINUR Negative 04/08/2021 1450   PROTEINUR NEGATIVE 07/28/2013 2028   UROBILINOGEN 0.2 07/28/2013 2028   NITRITE Negative 04/08/2021 1450   NITRITE NEGATIVE 07/28/2013 2028   LEUKOCYTESUR Negative 04/08/2021 1450    Lab Results  Component Value Date   LABMICR Comment 04/08/2021   WBCUA None seen 09/05/2020   LABEPIT 0-10 09/05/2020   BACTERIA None seen 09/05/2020    Pertinent Imaging:  No results found for this or any previous visit.  No results found for this or any previous visit.  No results found for this or any previous visit.  No results found  for this or any previous visit.  No results found for this or any previous visit.  No valid procedures specified. No results found for this or any previous visit.  No results found for this or any previous visit.   Assessment & Plan:    1. Erectile dysfunction due to arterial insufficiency -continue sildenafil prn  2. Low testosterone -jatenzo '158mg'$  BID -RTC 3 months with labs   No follow-ups on file.  Nicolette Bang, MD  [facility]

## 2021-11-06 NOTE — Addendum Note (Signed)
Addended by: Cleon Gustin on: 11/06/2021 03:02 PM   Modules accepted: Orders

## 2021-11-07 ENCOUNTER — Ambulatory Visit (INDEPENDENT_AMBULATORY_CARE_PROVIDER_SITE_OTHER): Payer: 59 | Admitting: Family Medicine

## 2021-11-07 ENCOUNTER — Other Ambulatory Visit: Payer: Self-pay

## 2021-11-07 ENCOUNTER — Encounter: Payer: Self-pay | Admitting: Family Medicine

## 2021-11-07 VITALS — BP 130/87 | HR 80 | Ht 71.0 in | Wt 231.1 lb

## 2021-11-07 DIAGNOSIS — Z23 Encounter for immunization: Secondary | ICD-10-CM | POA: Diagnosis not present

## 2021-11-07 DIAGNOSIS — E559 Vitamin D deficiency, unspecified: Secondary | ICD-10-CM

## 2021-11-07 DIAGNOSIS — Z125 Encounter for screening for malignant neoplasm of prostate: Secondary | ICD-10-CM

## 2021-11-07 DIAGNOSIS — Z1159 Encounter for screening for other viral diseases: Secondary | ICD-10-CM

## 2021-11-07 DIAGNOSIS — I10 Essential (primary) hypertension: Secondary | ICD-10-CM

## 2021-11-07 DIAGNOSIS — R0683 Snoring: Secondary | ICD-10-CM

## 2021-11-07 DIAGNOSIS — Z13220 Encounter for screening for lipoid disorders: Secondary | ICD-10-CM

## 2021-11-07 DIAGNOSIS — Z0000 Encounter for general adult medical examination without abnormal findings: Secondary | ICD-10-CM | POA: Diagnosis not present

## 2021-11-07 DIAGNOSIS — G4733 Obstructive sleep apnea (adult) (pediatric): Secondary | ICD-10-CM

## 2021-11-07 MED ORDER — CHLORTHALIDONE 25 MG PO TABS: 25.0000 mg | ORAL_TABLET | Freq: Every day | ORAL | 2 refills | Status: DC

## 2021-11-07 NOTE — Patient Instructions (Addendum)
F/u , re evaluate blood pressure in 8 weeks, call if you need me sooner  Stop spironolactone for blood pressure as this may be causing some breast enlargement.  New in its place is chlorthalidone 25 mg 1 daily.  Continue amlodipine as before.  Flu vaccine today.  Will hold off on imaging studies of the breast until next visit if the breast enlargement still persists.  Please get fasting labs as soon as possible CBC lipid CMP and EGFR TSH vitamin D hepatitis C screen and PSA.  You are referred for evaluation for sleep apnea, it is important that this is treated if you do have it as untreated you are at increased risk of brain heart and lung damage.  Please get pulmonary appt at checkout  Please commit to regular exercise 5 days/week to improve your overall health.  Thanks for choosing [facility], we consider it a privelige to serve you.

## 2021-11-07 NOTE — Progress Notes (Signed)
   Evan Jacobson     MRN: [id number]      DOB: 07/18/1965   HPI: Patient is in for annual physical exam. C/o persistent breast enlargement and tenderness even after stopping hormone therapy for low testosterone and ED C/o snoring and weight gain Immunization is reviewed , and  updated if needed.    PE; BP 130/87 (BP Location: Right Arm, Patient Position: Sitting, Cuff Size: Large)   Pulse 80   Ht '5\' 11"'$  (1.803 m)   Wt 231 lb 1.9 oz (104.8 kg)   SpO2 97%   BMI 32.23 kg/m   Pleasant male, alert and oriented x 3, in no cardio-pulmonary distress. Afebrile. HEENT No facial trauma or asymetry. Sinuses non tender. EOMI External ears normal,  Neck: supple, no adenopathy,JVD or thyromegaly.No bruits.  Chest: Clear to ascultation bilaterally.No crackles or wheezes. Non tender to palpation  Cardiovascular system; Heart sounds normal,  S1 and  S2 ,no S3.  Peripheral pulses normal.  Abdomen: Soft, non tender    Musculoskeletal exam: Decreased though adequate  ROM of spine, normal in hips , shoulders and knees. No deformity ,swelling or crepitus noted. No muscle wasting or atrophy.   Neurologic: Cranial nerves 2 to 12 intact. Power, tone ,sensation and reflexes normal throughout. No disturbance in gait. No tremor.  Skin: Intact, no ulceration, erythema , scaling or rash noted. Pigmentation normal throughout  Psych; Normal mood and affect. Judgement and concentration normal   Assessment & Plan:  Encounter for annual physical exam Annual exam as documented. Counseling done  re healthy lifestyle involving commitment to 150 minutes exercise per week, heart healthy diet, and attaining healthy weight.The importance of adequate sleep also discussed. Regular seat belt use and home safety, is also discussed. Changes in health habits are decided on by the patient with goals and time frames  set for achieving them. Immunization and cancer screening needs are specifically  addressed at this visit.   Essential hypertension Will stop spironolactone as may be causing breast enlargement and start chlorthalidone DASH diet and commitment to daily physical activity for a minimum of 30 minutes discussed and encouraged, as a part of hypertension management. The importance of attaining a healthy weight is also discussed.     11/07/2021    1:08 PM 11/06/2021    2:36 PM 08/30/2021   11:30 PM 08/30/2021   10:30 PM 08/30/2021   10:00 PM 08/30/2021    9:58 PM 08/14/2021    9:28 AM  BP/Weight  Systolic BP 732 202 542 706 237  628  Diastolic BP 87 78 92 80 84  96  Wt. (Lbs) 231.12     220   BMI 32.23 kg/m2     30.68 kg/m2        Snoring Lon h/o snoring, has thick neck and obesity and hTN, needs eval for OSA, referred

## 2021-11-08 ENCOUNTER — Encounter: Payer: Self-pay | Admitting: Family Medicine

## 2021-11-08 DIAGNOSIS — Z0000 Encounter for general adult medical examination without abnormal findings: Secondary | ICD-10-CM | POA: Insufficient documentation

## 2021-11-08 NOTE — Assessment & Plan Note (Signed)
Will stop spironolactone as may be causing breast enlargement and start chlorthalidone DASH diet and commitment to daily physical activity for a minimum of 30 minutes discussed and encouraged, as a part of hypertension management. The importance of attaining a healthy weight is also discussed.     11/07/2021    1:08 PM 11/06/2021    2:36 PM 08/30/2021   11:30 PM 08/30/2021   10:30 PM 08/30/2021   10:00 PM 08/30/2021    9:58 PM 08/14/2021    9:28 AM  BP/Weight  Systolic BP 316 742 552 589 483  475  Diastolic BP 87 78 92 80 84  96  Wt. (Lbs) 231.12     220   BMI 32.23 kg/m2     30.68 kg/m2

## 2021-11-08 NOTE — Assessment & Plan Note (Signed)

## 2021-11-08 NOTE — Assessment & Plan Note (Signed)
Lon h/o snoring, has thick neck and obesity and hTN, needs eval for OSA, referred

## 2021-11-11 ENCOUNTER — Encounter: Payer: Self-pay | Admitting: Urology

## 2021-11-12 MED ORDER — JATENZO 158 MG PO CAPS: 1.0000 | ORAL_CAPSULE | Freq: Two times a day (BID) | ORAL | 5 refills | Status: DC

## 2021-11-12 NOTE — Telephone Encounter (Signed)
Rx has been sent  

## 2021-11-19 ENCOUNTER — Telehealth: Payer: Self-pay

## 2021-11-19 ENCOUNTER — Other Ambulatory Visit: Payer: Self-pay

## 2021-11-19 DIAGNOSIS — R7989 Other specified abnormal findings of blood chemistry: Secondary | ICD-10-CM

## 2021-11-19 NOTE — Telephone Encounter (Signed)
Patient came in office needs Testerone added to his blood work this am.

## 2021-11-19 NOTE — Telephone Encounter (Signed)
added

## 2021-11-20 LAB — LIPID PANEL
Chol/HDL Ratio: 3.8 ratio (ref 0.0–5.0)
Cholesterol, Total: 207 mg/dL — ABNORMAL HIGH (ref 100–199)
HDL: 54 mg/dL (ref >39)
LDL Chol Calc (NIH): 143 mg/dL — ABNORMAL HIGH (ref 0–99)
Triglycerides: 53 mg/dL (ref 0–149)
VLDL Cholesterol Cal: 10 mg/dL (ref 5–40)

## 2021-11-20 LAB — CMP14+EGFR
ALT: 26 IU/L (ref 0–44)
AST: 27 IU/L (ref 0–40)
Albumin/Globulin Ratio: 1.3 (ref 1.2–2.2)
Albumin: 4.7 g/dL (ref 3.8–4.9)
Alkaline Phosphatase: 72 IU/L (ref 44–121)
BUN/Creatinine Ratio: 13 (ref 9–20)
BUN: 19 mg/dL (ref 6–24)
Bilirubin Total: 0.3 mg/dL (ref 0.0–1.2)
CO2: 28 mmol/L (ref 20–29)
Calcium: 9.9 mg/dL (ref 8.7–10.2)
Chloride: 94 mmol/L — ABNORMAL LOW (ref 96–106)
Creatinine, Ser: 1.45 mg/dL — ABNORMAL HIGH (ref 0.76–1.27)
Globulin, Total: 3.5 g/dL (ref 1.5–4.5)
Glucose: 105 mg/dL — ABNORMAL HIGH (ref 70–99)
Potassium: 3.5 mmol/L (ref 3.5–5.2)
Sodium: 140 mmol/L (ref 134–144)
Total Protein: 8.2 g/dL (ref 6.0–8.5)
eGFR: 57 mL/min/{1.73_m2} — ABNORMAL LOW (ref >59)

## 2021-11-20 LAB — CBC
Hematocrit: 48 % (ref 37.5–51.0)
Hemoglobin: 16.2 g/dL (ref 13.0–17.7)
MCH: 29.5 pg (ref 26.6–33.0)
MCHC: 33.8 g/dL (ref 31.5–35.7)
MCV: 87 fL (ref 79–97)
Platelets: 248 10*3/uL (ref 150–450)
RBC: 5.5 x10E6/uL (ref 4.14–5.80)
RDW: 12.3 % (ref 11.6–15.4)
WBC: 4.5 10*3/uL (ref 3.4–10.8)

## 2021-11-20 LAB — PSA: Prostate Specific Ag, Serum: 1.7 ng/mL (ref 0.0–4.0)

## 2021-11-20 LAB — VITAMIN D 25 HYDROXY (VIT D DEFICIENCY, FRACTURES): Vit D, 25-Hydroxy: 69.2 ng/mL (ref 30.0–100.0)

## 2021-11-20 LAB — HEPATITIS C ANTIBODY: Hep C Virus Ab: NONREACTIVE

## 2021-11-20 LAB — TSH: TSH: 1.25 u[IU]/mL (ref 0.450–4.500)

## 2021-11-23 LAB — TESTOSTERONE,FREE AND TOTAL
Testosterone, Free: 22.6 pg/mL (ref 7.2–24.0)
Testosterone: 258 ng/dL — ABNORMAL LOW (ref 264–916)

## 2021-12-08 ENCOUNTER — Other Ambulatory Visit: Payer: Self-pay | Admitting: Family Medicine

## 2021-12-08 DIAGNOSIS — I10 Essential (primary) hypertension: Secondary | ICD-10-CM

## 2021-12-31 ENCOUNTER — Emergency Department (HOSPITAL_COMMUNITY): Payer: No Typology Code available for payment source

## 2021-12-31 ENCOUNTER — Encounter (HOSPITAL_COMMUNITY): Payer: Self-pay | Admitting: Emergency Medicine

## 2021-12-31 ENCOUNTER — Other Ambulatory Visit: Payer: Self-pay

## 2021-12-31 ENCOUNTER — Emergency Department (HOSPITAL_COMMUNITY)
Admission: EM | Admit: 2021-12-31 | Discharge: 2021-12-31 | Disposition: A | Discharge status: Home / Self Care | Payer: No Typology Code available for payment source | Attending: Emergency Medicine | Admitting: Emergency Medicine

## 2021-12-31 DIAGNOSIS — E876 Hypokalemia: Secondary | ICD-10-CM | POA: Diagnosis not present

## 2021-12-31 DIAGNOSIS — Z79899 Other long term (current) drug therapy: Secondary | ICD-10-CM | POA: Insufficient documentation

## 2021-12-31 DIAGNOSIS — R438 Other disturbances of smell and taste: Secondary | ICD-10-CM | POA: Insufficient documentation

## 2021-12-31 DIAGNOSIS — I10 Essential (primary) hypertension: Secondary | ICD-10-CM | POA: Insufficient documentation

## 2021-12-31 DIAGNOSIS — R442 Other hallucinations: Secondary | ICD-10-CM

## 2021-12-31 LAB — CBC WITH DIFFERENTIAL/PLATELET
Abs Immature Granulocytes: 0.01 10*3/uL (ref 0.00–0.07)
Basophils Absolute: 0.1 10*3/uL (ref 0.0–0.1)
Basophils Relative: 1 %
Eosinophils Absolute: 0.3 10*3/uL (ref 0.0–0.5)
Eosinophils Relative: 6 %
HCT: 46 % (ref 39.0–52.0)
Hemoglobin: 15.7 g/dL (ref 13.0–17.0)
Immature Granulocytes: 0 %
Lymphocytes Relative: 36 %
Lymphs Abs: 1.9 10*3/uL (ref 0.7–4.0)
MCH: 30.3 pg (ref 26.0–34.0)
MCHC: 34.1 g/dL (ref 30.0–36.0)
MCV: 88.8 fL (ref 80.0–100.0)
Monocytes Absolute: 0.4 10*3/uL (ref 0.1–1.0)
Monocytes Relative: 7 %
Neutro Abs: 2.6 10*3/uL (ref 1.7–7.7)
Neutrophils Relative %: 50 %
Platelets: 250 10*3/uL (ref 150–400)
RBC: 5.18 MIL/uL (ref 4.22–5.81)
RDW: 12.4 % (ref 11.5–15.5)
WBC: 5.3 10*3/uL (ref 4.0–10.5)
nRBC: 0 % (ref 0.0–0.2)

## 2021-12-31 LAB — BASIC METABOLIC PANEL
Anion gap: 10 (ref 5–15)
BUN: 15 mg/dL (ref 6–20)
CO2: 29 mmol/L (ref 22–32)
Calcium: 9.3 mg/dL (ref 8.9–10.3)
Chloride: 100 mmol/L (ref 98–111)
Creatinine, Ser: 1.4 mg/dL — ABNORMAL HIGH (ref 0.61–1.24)
GFR, Estimated: 59 mL/min — ABNORMAL LOW (ref >60)
Glucose, Bld: 88 mg/dL (ref 70–99)
Potassium: 2.9 mmol/L — ABNORMAL LOW (ref 3.5–5.1)
Sodium: 139 mmol/L (ref 135–145)

## 2021-12-31 MED ORDER — POTASSIUM CHLORIDE CRYS ER 20 MEQ PO TBCR
40.0000 meq | EXTENDED_RELEASE_TABLET | Freq: Once | ORAL | Status: AC
  Administered 2021-12-31: 40 meq via ORAL
  Filled 2021-12-31: qty 2

## 2021-12-31 MED ORDER — POTASSIUM CHLORIDE CRYS ER 20 MEQ PO TBCR: 20.0000 meq | EXTENDED_RELEASE_TABLET | Freq: Two times a day (BID) | ORAL | 0 refills | Status: AC

## 2021-12-31 MED ORDER — FLUTICASONE PROPIONATE 50 MCG/ACT NA SUSP: 1.0000 | Freq: Every day | NASAL | 0 refills | Status: DC

## 2021-12-31 NOTE — ED Provider Notes (Signed)
[facility] EMERGENCY DEPARTMENT Provider Note   CSN: [id number] Arrival date & time: 12/31/21  1235     History  Chief Complaint  Patient presents with   smelling smoke    Evan Jacobson is a 56 y.o. male with a history of hypertension, PTSD, palpitations, GERD and history of allergy and chronic sinusitis presenting for evaluation of a 1 week history of constant olfactory hallucination, describing smoke smell despite not been exposed to any smoke.  He does have a neighbor that burns yard waste and when he initially started noticing the smell he thought that this was the source, however it was not.  Has not had exposure to cigarette smokers.  States it smells like burning wood, not cigarette smoke.  No other family members have similar symptoms.  He denies head injury, headaches, he does report chronic blurred vision which seems to be a little worsened since last week.  As noted, he does have chronic sinus congestion, denies fevers, chills, purulent nasal discharge.  He does take Zyrtec daily without improvement in sinus symptoms.  He also denies any dizziness, ear pain, focal weakness.  No recent illnesses including COVID 19.  Of note, he also mentions a tender nodule under his left armpit which she has noticed for the past 7 days but it has been intermittent, but is not able to find currently.  The history is provided by the patient.       Home Medications Prior to Admission medications   Medication Sig Start Date End Date Taking? Authorizing Provider  fluticasone (FLONASE) 50 MCG/ACT nasal spray Place 1 spray into both nostrils daily. 12/31/21  Yes Sharline Lehane, Almyra Free, PA-C  potassium chloride SA (KLOR-CON M) 20 MEQ tablet Take 1 tablet (20 mEq total) by mouth 2 (two) times daily. 12/31/21  Yes Tarance Balan, Almyra Free, PA-C  amLODipine (NORVASC) 10 MG tablet TAKE 1 TABLET BY MOUTH EVERY DAY 12/09/21   Fayrene Helper, MD  cetirizine (ZYRTEC) 10 MG tablet Take 10 mg by mouth daily.    [provider]  chlorthalidone (HYGROTON) 25 MG tablet TAKE 1 TABLET (25 MG TOTAL) BY MOUTH DAILY. 12/09/21   Fayrene Helper, MD  EPINEPHrine 0.3 mg/0.3 mL IJ SOAJ injection Inject 0.3 mg into the muscle once.  07/20/17   [provider]  sildenafil (VIAGRA) 100 MG tablet Take 1 tablet (100 mg total) by mouth as needed for erectile dysfunction. 11/06/21   McKenzie, Candee Furbish, MD  Testosterone Undecanoate (JATENZO) 158 MG CAPS Take 1 tablet by mouth 2 (two) times daily. 11/12/21   McKenzie, Candee Furbish, MD      Allergies    Sulfa antibiotics, Ace inhibitors, No known allergies, Spironolactone, Sulfasalazine, Sulfonamide derivatives, and Sulfamethoxazole-trimethoprim    Review of Systems   Review of Systems  Constitutional:  Negative for chills and fever.  HENT:  Positive for sinus pressure. Negative for congestion, dental problem, ear discharge, ear pain, sore throat, tinnitus and trouble swallowing.   Eyes: Negative.   Respiratory:  Negative for chest tightness and shortness of breath.   Cardiovascular:  Negative for chest pain.  Gastrointestinal:  Negative for abdominal pain and nausea.  Genitourinary: Negative.   Musculoskeletal:  Negative for arthralgias, joint swelling and neck pain.  Skin: Negative.  Negative for rash and wound.  Neurological:  Negative for dizziness, weakness, light-headedness, numbness and headaches.  Psychiatric/Behavioral: Negative.    All other systems reviewed and are negative.   Physical Exam Updated Vital Signs BP (!) 132/93 (BP  Location: Left Arm)   Pulse 80   Temp 97.9 F (36.6 C) (Oral)   Resp 16   SpO2 100%  Physical Exam Vitals and nursing note reviewed.  Constitutional:      Appearance: He is well-developed.  HENT:     Head: Normocephalic and atraumatic.     Right Ear: Tympanic membrane and ear canal normal.     Left Ear: Tympanic membrane and ear canal normal.     Nose: Mucosal edema present.     Right Turbinates: Swollen.      Left Turbinates: Swollen.     Comments: No sinus tenderness Eyes:     Conjunctiva/sclera: Conjunctivae normal.  Cardiovascular:     Rate and Rhythm: Normal rate and regular rhythm.     Heart sounds: Normal heart sounds.  Pulmonary:     Effort: Pulmonary effort is normal.     Breath sounds: Normal breath sounds. No wheezing.  Musculoskeletal:        General: Normal range of motion.     Cervical back: Normal range of motion.  Skin:    General: Skin is warm and dry.  Neurological:     Mental Status: He is alert.     ED Results / Procedures / Treatments   Labs (all labs ordered are listed, but only abnormal results are displayed) Labs Reviewed  BASIC METABOLIC PANEL - Abnormal; Notable for the following components:      Result Value   Potassium 2.9 (*)    Creatinine, Ser 1.40 (*)    GFR, Estimated 59 (*)    All other components within normal limits  CBC WITH DIFFERENTIAL/PLATELET    EKG None  Radiology CT Head Wo Contrast  Result Date: 12/31/2021 CLINICAL DATA:  Smelling smoke EXAM: CT HEAD WITHOUT CONTRAST CT MAXILLOFACIAL WITHOUT CONTRAST TECHNIQUE: Multidetector CT imaging of the head and maxillofacial structures were performed using the standard protocol without intravenous contrast. Multiplanar CT image reconstructions of the maxillofacial structures were also generated. RADIATION DOSE REDUCTION: This exam was performed according to the departmental dose-optimization program which includes automated exposure control, adjustment of the mA and/or kV according to patient size and/or use of iterative reconstruction technique. COMPARISON:  None Available. FINDINGS: CT HEAD FINDINGS Brain: No evidence of acute infarction, hemorrhage, hydrocephalus, extra-axial collection or mass lesion/mass effect. Vascular: No hyperdense vessel or unexpected calcification. Skull: Normal. Negative for fracture or focal lesion. Other: None. CT MAXILLOFACIAL FINDINGS Osseous: No fracture or  mandibular dislocation. No destructive process. Orbits: Negative. No traumatic or inflammatory finding. Sinuses: Bilateral sphenoethmoidal recesses are occluded. Mucosal thickening bilateral ethmoid sinuses. Left OMC is occluded. Right OMC is narrowed, but patent. Bilateral frontal recesses are occluded. Soft tissues: Negative. IMPRESSION: CT HEAD: No acute intracranial abnormality. CT MAXILLOFACIAL: 1. No acute facial bone fracture. 2. Mild paranasal sinus disease with occlusion of the nearly all sinus pathways. Electronically Signed   By: Marin Roberts M.D.   On: 12/31/2021 15:25   CT Maxillofacial Wo Contrast  Result Date: 12/31/2021 CLINICAL DATA:  Smelling smoke EXAM: CT HEAD WITHOUT CONTRAST CT MAXILLOFACIAL WITHOUT CONTRAST TECHNIQUE: Multidetector CT imaging of the head and maxillofacial structures were performed using the standard protocol without intravenous contrast. Multiplanar CT image reconstructions of the maxillofacial structures were also generated. RADIATION DOSE REDUCTION: This exam was performed according to the departmental dose-optimization program which includes automated exposure control, adjustment of the mA and/or kV according to patient size and/or use of iterative reconstruction technique. COMPARISON:  None Available. FINDINGS: CT HEAD  FINDINGS Brain: No evidence of acute infarction, hemorrhage, hydrocephalus, extra-axial collection or mass lesion/mass effect. Vascular: No hyperdense vessel or unexpected calcification. Skull: Normal. Negative for fracture or focal lesion. Other: None. CT MAXILLOFACIAL FINDINGS Osseous: No fracture or mandibular dislocation. No destructive process. Orbits: Negative. No traumatic or inflammatory finding. Sinuses: Bilateral sphenoethmoidal recesses are occluded. Mucosal thickening bilateral ethmoid sinuses. Left OMC is occluded. Right OMC is narrowed, but patent. Bilateral frontal recesses are occluded. Soft tissues: Negative. IMPRESSION: CT HEAD: No  acute intracranial abnormality. CT MAXILLOFACIAL: 1. No acute facial bone fracture. 2. Mild paranasal sinus disease with occlusion of the nearly all sinus pathways. Electronically Signed   By: Marin Roberts M.D.   On: 12/31/2021 15:25    Procedures Procedures    Medications Ordered in ED Medications  potassium chloride SA (KLOR-CON M) CR tablet 40 mEq (40 mEq Oral Given 12/31/21 1742)    ED Course/ Medical Decision Making/ A&P                           Medical Decision Making Patient presenting with a 1 week history of phantosmia, smelling burning with smoke despite no exposures and no other family members endorse similar symptoms.  He does have chronic sinusitis for years, on daily Zyrtec, denies fevers, increased sinus pain or purulent nasal drainage.  He also denies headache, head injury, any other focal weakness.  Differential diagnoses including subacute CVA as symptoms have been present for 1 week, intracranial mass, viral infection such as COVID-19, although he has no other symptoms suggesting COVID-19, doubt this could be the source of his presenting complaint.  More inclined to suspect this is related to his sinus disease.  He was encouraged to continue to taking his Zyrtec, will add Flonase nasal spray and plan follow-up care with ENT.  He was given referrals for this follow-up.  Amount and/or Complexity of Data Reviewed Labs: ordered.    Details: Basic labs were obtained and are unremarkable, he has a normal WBC count at 5.3.  He does have a potassium level of 2.9 and is on chlorthalidone for blood pressure control.  This is probably the reason for the hypokalemia.  He was given oral dose here along with a prescription for complete repletion at home.  He does have a bump in his creatinine at 1.4, this is chronic based on prior lab results. Radiology: ordered.    Details: CT head negative for acute intracranial process.  CT maxillofacial significant for sinus disease and occlusion of  nearly all of the sinus pathways.  Risk OTC drugs. Prescription drug management.           Final Clinical Impression(s) / ED Diagnoses Final diagnoses:  Phantosmia  Hypokalemia    Rx / DC Orders ED Discharge Orders          Ordered    potassium chloride SA (KLOR-CON M) 20 MEQ tablet  2 times daily        12/31/21 1729    fluticasone (FLONASE) 50 MCG/ACT nasal spray  Daily        12/31/21 1729              Evalee Jefferson, PA-C 12/31/21 1823    Davonna Belling, MD 01/01/22 1014

## 2021-12-31 NOTE — Discharge Instructions (Signed)
As discussed your head CT scan is completely negative today, however you have significant sinus disease which I believe may be the source of your chronic smoke "hallucinations ".  I do recommend Flonase nasal spray which has been prescribed to to see if this will help open your sinuses.  Of note, your lab test also revealed that your potassium level is low, this is a common finding for people who take diuretic medications, namely your chlorthalidone.  I have prescribed you some potassium to replace this.  As you continue using your chlorthalidone you may want to consider increasing your dietary consumption of potassium.  Bananas and orange juice are particularly good foods for keeping your potassium level within a good range.  Your potassium level is not the reason for your symptoms today.

## 2021-12-31 NOTE — ED Triage Notes (Addendum)
Pt c/o smelling smoke x 5 days now. Denies any headaches. States his chronic blurred vision has been more blurred since last week. Denies weakness/trouble speaking or swallowing. Denies drainage from ears.  Pt c/o lump under left armpit x 7 days-denies drainage

## 2022-01-02 ENCOUNTER — Ambulatory Visit: Payer: 59 | Admitting: Family Medicine

## 2022-01-03 ENCOUNTER — Encounter: Payer: Self-pay | Admitting: Family Medicine

## 2022-01-03 ENCOUNTER — Ambulatory Visit (INDEPENDENT_AMBULATORY_CARE_PROVIDER_SITE_OTHER): Payer: 59 | Admitting: Family Medicine

## 2022-01-03 ENCOUNTER — Other Ambulatory Visit: Payer: Self-pay | Admitting: Family Medicine

## 2022-01-03 VITALS — BP 126/81 | HR 91 | Ht 71.0 in | Wt 232.1 lb

## 2022-01-03 DIAGNOSIS — Z13220 Encounter for screening for lipoid disorders: Secondary | ICD-10-CM | POA: Diagnosis not present

## 2022-01-03 DIAGNOSIS — I10 Essential (primary) hypertension: Secondary | ICD-10-CM | POA: Diagnosis not present

## 2022-01-03 DIAGNOSIS — J310 Chronic rhinitis: Secondary | ICD-10-CM

## 2022-01-03 DIAGNOSIS — K219 Gastro-esophageal reflux disease without esophagitis: Secondary | ICD-10-CM

## 2022-01-03 DIAGNOSIS — T7840XD Allergy, unspecified, subsequent encounter: Secondary | ICD-10-CM

## 2022-01-03 DIAGNOSIS — E669 Obesity, unspecified: Secondary | ICD-10-CM

## 2022-01-03 DIAGNOSIS — J3089 Other allergic rhinitis: Secondary | ICD-10-CM

## 2022-01-03 MED ORDER — CETIRIZINE HCL 10 MG PO TABS: 10.0000 mg | ORAL_TABLET | Freq: Two times a day (BID) | ORAL | 2 refills | Status: AC

## 2022-01-03 MED ORDER — AZELASTINE HCL 0.1 % NA SOLN: 2.0000 | Freq: Two times a day (BID) | NASAL | 12 refills | Status: AC

## 2022-01-03 MED ORDER — FLUTICASONE PROPIONATE 50 MCG/ACT NA SUSP: 2.0000 | Freq: Every day | NASAL | 6 refills | Status: DC

## 2022-01-03 MED ORDER — ESOMEPRAZOLE MAGNESIUM 40 MG PO CPDR: 40.0000 mg | DELAYED_RELEASE_CAPSULE | Freq: Every day | ORAL | 3 refills | Status: DC

## 2022-01-03 NOTE — Patient Instructions (Addendum)
F/U in 4 months, call if you need me sooner  Commit to twice daily zyrtec, and astellin and 2 puffs daily of flonase  You are referred to Allergist  Nexium is prescribed for reflux, please cut out caffeine  [place]!!!  Non fasting chem 7 and EGFR next week  Fasting lipid, cmp and EGFR 3 to 5 days before April appt  Thanks for choosing [facility], we consider it a privelige to serve you.

## 2022-01-07 ENCOUNTER — Encounter: Payer: Self-pay | Admitting: Family Medicine

## 2022-01-07 NOTE — Assessment & Plan Note (Signed)
Uncontrolled, neefds to commit to meds twice daily, and is referred to Allergist

## 2022-01-07 NOTE — Assessment & Plan Note (Signed)
Uncontrolled, was on immunotherapy in the past, refer for re testing

## 2022-01-07 NOTE — Assessment & Plan Note (Signed)
Uncontrolled, commit to  daily PPI and eliminate caffeine

## 2022-01-07 NOTE — Assessment & Plan Note (Signed)
  Patient re-educated about  the importance of commitment to a  minimum of 150 minutes of exercise per week as able.  The importance of healthy food choices with portion control discussed, as well as eating regularly and within a 12 hour window most days. The need to choose "clean , green" food 50 to 75% of the time is discussed, as well as to make water the primary drink and set a goal of 64 ounces water daily.       01/03/2022    2:08 PM 11/07/2021    1:08 PM 08/30/2021    9:58 PM  Weight /BMI  Weight 232 lb 1.9 oz 231 lb 1.9 oz 220 lb  Height '5\' 11"'$  (1.803 m) '5\' 11"'$  (1.803 m) '5\' 11"'$  (1.803 m)  BMI 32.37 kg/m2 32.23 kg/m2 30.68 kg/m2

## 2022-01-07 NOTE — Progress Notes (Signed)
   [name]     MRN: [id number]      DOB: 1966/01/16   HPI [name] is here for follow up [place] recent ED visit, states he has been having abnormal scent of smoke, and this is occurring in  different environments. Has been on immunotherapy in the past and reports chronic head congestion C/o uncontrolled reflux symptoms of heartburn ROS Denies recent fever or chills.  Denies chest congestion, productive cough or wheezing. Denies chest pains, palpitations and leg swelling Denies , nausea, vomiting,diarrhea or constipation.   Denies dysuria, frequency, hesitancy or incontinence. Denies uncontrolled joint pain, swelling and limitation in mobility. Denies headaches, seizures, numbness, or tingling. Denies depression, anxiety or insomnia. Denies skin break down or rash.   PE  BP 126/81 (BP Location: Right Arm, Patient Position: Sitting, Cuff Size: Large)   Pulse 91   Ht '5\' 11"'$  (1.803 m)   Wt 232 lb 1.9 oz (105.3 kg)   SpO2 93%   BMI 32.37 kg/m   Patient alert and oriented and in no cardiopulmonary distress.  HEENT: No facial asymmetry, EOMI,     Neck supple .No sinus tenderness, positive nasal congection  Chest: Clear to auscultation bilaterally.  CVS: S1, S2 no murmurs, no S3.Regular rate.   Ext: No edema  MS: Adequate ROM spine, shoulders, hips and knees.  Skin: Intact, no ulcerations or rash noted.  Psych: Good eye contact, normal affect. Memory intact not anxious or depressed appearing.  CNS: CN 2-12 intact, power,  normal throughout.no focal deficits noted.   Assessment & Plan  Rhinitis, chronic  Uncontrolled, neefds to commit to meds twice daily, and is referred to Allergist  Other allergic rhinitis Uncontrolled, was on immunotherapy in the past, refer for re testing  GERD Uncontrolled, commit to  daily PPI and eliminate caffeine  Obesity (BMI 30.0-34.9)  Patient re-educated about  the importance of commitment to a  minimum of 150 minutes of  exercise per week as able.  The importance of healthy food choices with portion control discussed, as well as eating regularly and within a 12 hour window most days. The need to choose "clean , green" food 50 to 75% of the time is discussed, as well as to make water the primary drink and set a goal of 64 ounces water daily.       01/03/2022    2:08 PM 11/07/2021    1:08 PM 08/30/2021    9:58 PM  Weight /BMI  Weight 232 lb 1.9 oz 231 lb 1.9 oz 220 lb  Height '5\' 11"'$  (1.803 m) '5\' 11"'$  (1.803 m) '5\' 11"'$  (1.803 m)  BMI 32.37 kg/m2 32.23 kg/m2 30.68 kg/m2      Essential hypertension Controlled, no change in medication DASH diet and commitment to daily physical activity for a minimum of 30 minutes discussed and encouraged, as a part of hypertension management. The importance of attaining a healthy weight is also discussed.     01/03/2022    2:08 PM 12/31/2021    5:58 PM 12/31/2021    4:00 PM 12/31/2021    1:17 PM 11/07/2021    1:08 PM 11/06/2021    2:36 PM 08/30/2021   11:30 PM  BP/Weight  Systolic BP 400 867 619 [phone] 458  Diastolic BP 81 93 89 099 87 78 92  Wt. (Lbs) 232.12    231.12    BMI 32.37 kg/m2    32.23 kg/m2

## 2022-01-07 NOTE — Assessment & Plan Note (Signed)
Controlled, no change in medication DASH diet and commitment to daily physical activity for a minimum of 30 minutes discussed and encouraged, as a part of hypertension management. The importance of attaining a healthy weight is also discussed.     01/03/2022    2:08 PM 12/31/2021    5:58 PM 12/31/2021    4:00 PM 12/31/2021    1:17 PM 11/07/2021    1:08 PM 11/06/2021    2:36 PM 08/30/2021   11:30 PM  BP/Weight  Systolic BP 102 890 228 406 986 148 307  Diastolic BP 81 93 89 354 87 78 92  Wt. (Lbs) 232.12    231.12    BMI 32.37 kg/m2    32.23 kg/m2

## 2022-01-09 ENCOUNTER — Institutional Professional Consult (permissible substitution): Payer: 59 | Admitting: Pulmonary Disease

## 2022-02-06 ENCOUNTER — Other Ambulatory Visit: Payer: 59

## 2022-02-07 LAB — CBC WITH DIFFERENTIAL
Basophils Absolute: 0.1 10*3/uL (ref 0.0–0.2)
Basos: 1 %
EOS (ABSOLUTE): 0.4 10*3/uL (ref 0.0–0.4)
Eos: 10 %
Hematocrit: 46.8 % (ref 37.5–51.0)
Hemoglobin: 16.3 g/dL (ref 13.0–17.7)
Immature Grans (Abs): 0 10*3/uL (ref 0.0–0.1)
Immature Granulocytes: 0 %
Lymphocytes Absolute: 1.5 10*3/uL (ref 0.7–3.1)
Lymphs: 35 %
MCH: 30.6 pg (ref 26.6–33.0)
MCHC: 34.8 g/dL (ref 31.5–35.7)
MCV: 88 fL (ref 79–97)
Monocytes Absolute: 0.3 10*3/uL (ref 0.1–0.9)
Monocytes: 7 %
Neutrophils Absolute: 2.1 10*3/uL (ref 1.4–7.0)
Neutrophils: 47 %
RBC: 5.33 x10E6/uL (ref 4.14–5.80)
RDW: 11.9 % (ref 11.6–15.4)
WBC: 4.4 10*3/uL (ref 3.4–10.8)

## 2022-02-14 ENCOUNTER — Ambulatory Visit (INDEPENDENT_AMBULATORY_CARE_PROVIDER_SITE_OTHER): Payer: 59 | Admitting: Urology

## 2022-02-14 VITALS — BP 137/97 | HR 93

## 2022-02-14 DIAGNOSIS — R7989 Other specified abnormal findings of blood chemistry: Secondary | ICD-10-CM | POA: Diagnosis not present

## 2022-02-14 DIAGNOSIS — N5201 Erectile dysfunction due to arterial insufficiency: Secondary | ICD-10-CM | POA: Diagnosis not present

## 2022-02-14 MED ORDER — JATENZO 158 MG PO CAPS: 158.0000 mg | ORAL_CAPSULE | Freq: Two times a day (BID) | ORAL | 5 refills | Status: DC

## 2022-02-14 NOTE — Progress Notes (Signed)
02/14/2022 11:06 AM   Dione Housekeeper 1965-03-18 [id number]  Referring provider: Fayrene Helper, MD [address] [place],  [place] [address]  Followup erectile dysfunction and hypogonadism   HPI: Evan Jacobson is a 57yo here for followup for erectile dysfunction and hypogonadism.  Good libido. Good energy. Testosterone 291. Hemoglobin 16.3. He is on jatenzo '158mg'$  BID. His erectile dysfunction is improved since last visit.   PMH: Past Medical History:  Diagnosis Date   Allergic rhinitis    Bipolar disorder ([place]) 08/25/2011   Chronic back pain    Concussion 2012   GERD (gastroesophageal reflux disease)    Groin pain, chronic, right 06/28/2014   Heart palpitations 06/18/2016   Hypertension    Persistent adjustment disorder 12/05/2015   Chronic adjustment disorder diagnosed by [facility] and counseling recomended   PTSD (post-traumatic stress disorder)    Sinusitis    Skin lump of leg, left 11/17/2017   Vertigo 01/08/2017    Surgical History: Past Surgical History:  Procedure Laterality Date   COLONOSCOPY N/A 08/12/2017   Procedure: COLONOSCOPY;  Surgeon: Rogene Houston, MD;  Location: [facility]ITE;  Service: Endoscopy;  Laterality: N/A;  730   NO PAST SURGERIES      Home Medications:  Allergies as of 02/14/2022       Reactions   Sulfa Antibiotics Anaphylaxis   Ace Inhibitors Other (See Comments)   Feels as though throat is closing up intermittently   No Known Allergies    Other reaction(s): MALE ERECTILE DISORDER   Spironolactone Other (See Comments)   Breast enlargement and tenderness   Sulfasalazine Other (See Comments)   Tongue swelled   Sulfonamide Derivatives Other (See Comments)   Tongue swelled   Sulfamethoxazole-trimethoprim Hives, Rash        Medication List        Accurate as of February 14, 2022 11:06 AM. If you have any questions, ask your nurse or doctor.          amLODipine 10 MG tablet Commonly known as: NORVASC TAKE 1 TABLET BY  MOUTH EVERY DAY   azelastine 0.1 % nasal spray Commonly known as: ASTELIN Place 2 sprays into both nostrils 2 (two) times daily. Use in each nostril as directed   cetirizine 10 MG tablet Commonly known as: ZYRTEC Take 10 mg by mouth daily.   cetirizine 10 MG tablet Commonly known as: ZYRTEC Take 1 tablet (10 mg total) by mouth 2 (two) times daily.   chlorthalidone 25 MG tablet Commonly known as: HYGROTON TAKE 1 TABLET (25 MG TOTAL) BY MOUTH DAILY.   EPINEPHrine 0.3 mg/0.3 mL Soaj injection Commonly known as: EPI-PEN Inject 0.3 mg into the muscle once.   esomeprazole 40 MG capsule Commonly known as: NexIUM Take 1 capsule (40 mg total) by mouth daily.   fluticasone 50 MCG/ACT nasal spray Commonly known as: FLONASE Place 2 sprays into both nostrils daily.   Jatenzo 158 MG Caps Generic drug: Testosterone Undecanoate Take 1 tablet by mouth 2 (two) times daily.   potassium chloride SA 20 MEQ tablet Commonly known as: KLOR-CON M Take 1 tablet (20 mEq total) by mouth 2 (two) times daily.   sildenafil 100 MG tablet Commonly known as: VIAGRA Take 1 tablet (100 mg total) by mouth as needed for erectile dysfunction.        Allergies:  Allergies  Allergen Reactions   Sulfa Antibiotics Anaphylaxis   Ace Inhibitors Other (See Comments)    Feels as though  throat is closing up intermittently   No Known Allergies     Other reaction(s): MALE ERECTILE DISORDER   Spironolactone Other (See Comments)    Breast enlargement and tenderness   Sulfasalazine Other (See Comments)    Tongue swelled    Sulfonamide Derivatives Other (See Comments)    Tongue swelled   Sulfamethoxazole-Trimethoprim Hives and Rash    Family History: Family History  Problem Relation Age of Onset   Stroke Father    Hypertension Father    Hypertension Mother        GAB   Heart disease Mother        Angina   Hypertension Sister    Hypertension Sister    Hypertension Brother    Hypertension Brother     Colon cancer Neg Hx     Social History:  reports that he has never smoked. He has never used smokeless tobacco. He reports current alcohol use. He reports that he does not use drugs.  ROS: All other review of systems were reviewed and are negative except what is noted above in HPI  Physical Exam: BP (!) 137/97   Pulse 93   Constitutional:  Alert and oriented, No acute distress. HEENT: [place] AT, moist mucus membranes.  Trachea midline, no masses. Cardiovascular: No clubbing, cyanosis, or edema. Respiratory: Normal respiratory effort, no increased work of breathing. GI: Abdomen is soft, nontender, nondistended, no abdominal masses GU: No CVA tenderness.  Lymph: No cervical or inguinal lymphadenopathy. Skin: No rashes, bruises or suspicious lesions. Neurologic: Grossly intact, no focal deficits, moving all 4 extremities. Psychiatric: Normal mood and affect.  Laboratory Data: Lab Results  Component Value Date   WBC 4.4 02/06/2022   HGB 16.3 02/06/2022   HCT 46.8 02/06/2022   MCV 88 02/06/2022   PLT 250 12/31/2021    Lab Results  Component Value Date   CREATININE 1.40 (H) 12/31/2021    Lab Results  Component Value Date   PSA 1.0 06/03/2019   PSA 1.2 07/22/2018   PSA 1.0 06/18/2016    Lab Results  Component Value Date   TESTOSTERONE 258 (L) 11/19/2021    Lab Results  Component Value Date   HGBA1C 5.1 04/24/2021    Urinalysis    Component Value Date/Time   COLORURINE YELLOW 07/28/2013 2028   APPEARANCEUR Clear 04/08/2021 1450   LABSPEC 1.015 07/28/2013 2028   PHURINE 6.0 07/28/2013 2028   GLUCOSEU Negative 04/08/2021 1450   HGBUR TRACE (A) 07/28/2013 2028   HGBUR negative 12/05/2008 1305   BILIRUBINUR Negative 04/08/2021 1450   KETONESUR NEGATIVE 07/28/2013 2028   PROTEINUR Negative 04/08/2021 1450   PROTEINUR NEGATIVE 07/28/2013 2028   UROBILINOGEN 0.2 07/28/2013 2028   NITRITE Negative 04/08/2021 1450   NITRITE NEGATIVE 07/28/2013 2028   LEUKOCYTESUR  Negative 04/08/2021 1450    Lab Results  Component Value Date   LABMICR Comment 04/08/2021   WBCUA None seen 09/05/2020   LABEPIT 0-10 09/05/2020   BACTERIA None seen 09/05/2020    Pertinent Imaging:  No results found for this or any previous visit.  No results found for this or any previous visit.  No results found for this or any previous visit.  No results found for this or any previous visit.  No results found for this or any previous visit.  No valid procedures specified. No results found for this or any previous visit.  No results found for this or any previous visit.   Assessment & Plan:    1. Erectile  dysfunction due to arterial insufficiency Patient defers therapy at this time  2. Low testosterone -Jatenzo '158mg'$  BID -followp 3 months with testosterone labs   No follow-ups on file.  Nicolette Bang, MD  [facility] [place]

## 2022-02-15 ENCOUNTER — Encounter: Payer: Self-pay | Admitting: Urology

## 2022-02-15 LAB — COMPREHENSIVE METABOLIC PANEL
ALT: 34 IU/L (ref 0–44)
AST: 38 IU/L (ref 0–40)
Albumin/Globulin Ratio: 1.4 (ref 1.2–2.2)
Albumin: 4.3 g/dL (ref 3.8–4.9)
Alkaline Phosphatase: 62 IU/L (ref 44–121)
BUN/Creatinine Ratio: 18 (ref 9–20)
BUN: 25 mg/dL — ABNORMAL HIGH (ref 6–24)
Bilirubin Total: 0.4 mg/dL (ref 0.0–1.2)
CO2: 25 mmol/L (ref 20–29)
Calcium: 9.9 mg/dL (ref 8.7–10.2)
Chloride: 98 mmol/L (ref 96–106)
Creatinine, Ser: 1.37 mg/dL — ABNORMAL HIGH (ref 0.76–1.27)
Globulin, Total: 3 g/dL (ref 1.5–4.5)
Glucose: 113 mg/dL — ABNORMAL HIGH (ref 70–99)
Potassium: 3.2 mmol/L — ABNORMAL LOW (ref 3.5–5.2)
Sodium: 140 mmol/L (ref 134–144)
Total Protein: 7.3 g/dL (ref 6.0–8.5)
eGFR: 61 mL/min/{1.73_m2} (ref >59)

## 2022-02-15 LAB — TESTOSTERONE,FREE AND TOTAL
Testosterone, Free: 28.2 pg/mL — ABNORMAL HIGH (ref 7.2–24.0)
Testosterone: 291 ng/dL (ref 264–916)

## 2022-02-24 ENCOUNTER — Encounter: Payer: Self-pay | Admitting: Urology

## 2022-02-24 NOTE — Patient Instructions (Signed)

## 2022-03-27 ENCOUNTER — Encounter: Payer: Self-pay | Admitting: Radiology

## 2022-05-09 ENCOUNTER — Ambulatory Visit: Payer: 59 | Admitting: Family Medicine

## 2022-07-01 ENCOUNTER — Encounter (INDEPENDENT_AMBULATORY_CARE_PROVIDER_SITE_OTHER): Payer: Self-pay | Admitting: *Deleted

## 2022-07-30 ENCOUNTER — Other Ambulatory Visit: Payer: Self-pay | Admitting: Family Medicine

## 2022-07-30 DIAGNOSIS — I10 Essential (primary) hypertension: Secondary | ICD-10-CM

## 2022-08-18 ENCOUNTER — Other Ambulatory Visit: Payer: 59

## 2022-08-18 DIAGNOSIS — R7989 Other specified abnormal findings of blood chemistry: Secondary | ICD-10-CM

## 2022-08-19 LAB — COMPREHENSIVE METABOLIC PANEL
ALT: 35 IU/L (ref 0–44)
AST: 43 IU/L — ABNORMAL HIGH (ref 0–40)
Albumin: 4.3 g/dL (ref 3.8–4.9)
Alkaline Phosphatase: 66 IU/L (ref 44–121)
BUN/Creatinine Ratio: 15 (ref 9–20)
BUN: 21 mg/dL (ref 6–24)
Bilirubin Total: 0.8 mg/dL (ref 0.0–1.2)
CO2: 26 mmol/L (ref 20–29)
Calcium: 9.5 mg/dL (ref 8.7–10.2)
Chloride: 95 mmol/L — ABNORMAL LOW (ref 96–106)
Creatinine, Ser: 1.37 mg/dL — ABNORMAL HIGH (ref 0.76–1.27)
Globulin, Total: 3 g/dL (ref 1.5–4.5)
Glucose: 86 mg/dL (ref 70–99)
Potassium: 3.4 mmol/L — ABNORMAL LOW (ref 3.5–5.2)
Sodium: 138 mmol/L (ref 134–144)
Total Protein: 7.3 g/dL (ref 6.0–8.5)
eGFR: 61 mL/min/{1.73_m2} (ref >59)

## 2022-08-19 LAB — CBC WITH DIFFERENTIAL
Basophils Absolute: 0.1 10*3/uL (ref 0.0–0.2)
Basos: 1 %
EOS (ABSOLUTE): 0.3 10*3/uL (ref 0.0–0.4)
Eos: 6 %
Hematocrit: 55.6 % — ABNORMAL HIGH (ref 37.5–51.0)
Hemoglobin: 18.5 g/dL — ABNORMAL HIGH (ref 13.0–17.7)
Immature Grans (Abs): 0 10*3/uL (ref 0.0–0.1)
Immature Granulocytes: 0 %
Lymphocytes Absolute: 2.1 10*3/uL (ref 0.7–3.1)
Lymphs: 44 %
MCH: 29.6 pg (ref 26.6–33.0)
MCHC: 33.3 g/dL (ref 31.5–35.7)
MCV: 89 fL (ref 79–97)
Monocytes Absolute: 0.5 10*3/uL (ref 0.1–0.9)
Monocytes: 11 %
Neutrophils Absolute: 1.8 10*3/uL (ref 1.4–7.0)
Neutrophils: 38 %
RBC: 6.25 x10E6/uL — ABNORMAL HIGH (ref 4.14–5.80)
RDW: 13.4 % (ref 11.6–15.4)
WBC: 4.8 10*3/uL (ref 3.4–10.8)

## 2022-08-19 LAB — TESTOSTERONE,FREE AND TOTAL
Testosterone, Free: 13.6 pg/mL (ref 7.2–24.0)
Testosterone: 584 ng/dL (ref 264–916)

## 2022-08-31 ENCOUNTER — Emergency Department (HOSPITAL_COMMUNITY)
Admission: EM | Admit: 2022-08-31 | Discharge: 2022-08-31 | Disposition: A | Discharge status: Home / Self Care | Payer: No Typology Code available for payment source

## 2022-08-31 ENCOUNTER — Other Ambulatory Visit: Payer: Self-pay

## 2022-08-31 ENCOUNTER — Emergency Department (HOSPITAL_COMMUNITY): Payer: No Typology Code available for payment source

## 2022-08-31 ENCOUNTER — Encounter (HOSPITAL_COMMUNITY): Payer: Self-pay

## 2022-08-31 DIAGNOSIS — R079 Chest pain, unspecified: Secondary | ICD-10-CM | POA: Diagnosis present

## 2022-08-31 DIAGNOSIS — D751 Secondary polycythemia: Secondary | ICD-10-CM

## 2022-08-31 LAB — CBC
HCT: 55.4 % — ABNORMAL HIGH (ref 39.0–52.0)
Hemoglobin: 19 g/dL — ABNORMAL HIGH (ref 13.0–17.0)
MCH: 30.6 pg (ref 26.0–34.0)
MCHC: 34.3 g/dL (ref 30.0–36.0)
MCV: 89.2 fL (ref 80.0–100.0)
Platelets: 237 10*3/uL (ref 150–400)
RBC: 6.21 MIL/uL — ABNORMAL HIGH (ref 4.22–5.81)
RDW: 13.2 % (ref 11.5–15.5)
WBC: 4.3 10*3/uL (ref 4.0–10.5)
nRBC: 0 % (ref 0.0–0.2)

## 2022-08-31 LAB — BASIC METABOLIC PANEL
Anion gap: 12 (ref 5–15)
BUN: 10 mg/dL (ref 6–20)
CO2: 28 mmol/L (ref 22–32)
Calcium: 9.5 mg/dL (ref 8.9–10.3)
Chloride: 100 mmol/L (ref 98–111)
Creatinine, Ser: 1.42 mg/dL — ABNORMAL HIGH (ref 0.61–1.24)
GFR, Estimated: 58 mL/min — ABNORMAL LOW (ref >60)
Glucose, Bld: 87 mg/dL (ref 70–99)
Potassium: 3.5 mmol/L (ref 3.5–5.1)
Sodium: 140 mmol/L (ref 135–145)

## 2022-08-31 LAB — TROPONIN I (HIGH SENSITIVITY): Troponin I (High Sensitivity): 5 ng/L (ref <18)

## 2022-08-31 MED ORDER — LIDOCAINE 4 % EX PTCH: 1.0000 | MEDICATED_PATCH | CUTANEOUS | 0 refills | Status: DC

## 2022-08-31 MED ORDER — ACETAMINOPHEN 500 MG PO TABS
1000.0000 mg | ORAL_TABLET | Freq: Once | ORAL | Status: AC
  Administered 2022-08-31: 1000 mg via ORAL
  Filled 2022-08-31: qty 2

## 2022-08-31 MED ORDER — LIDOCAINE 5 % EX PTCH
1.0000 | MEDICATED_PATCH | Freq: Once | CUTANEOUS | Status: DC
  Administered 2022-08-31: 1 via TRANSDERMAL
  Filled 2022-08-31: qty 1

## 2022-08-31 NOTE — ED Provider Notes (Signed)
[place] EMERGENCY DEPARTMENT AT [facility] Provider Note   CSN: [id number] Arrival date & time: 08/31/22  1123     History  Chief Complaint  Patient presents with   Chest Pain    KHRISTIAN PHILLIPPI is a 57 y.o. male.  57 year old male present emergency department chief complaint of left-sided chest pain.  Described as sharp/pressure.  Onset 3 days ago has been constant since time.  Worsened with movement.  No inciting event.  Pain reproducible with palpation.  Low risk for PE on Wells criteria.    Chest Pain      Home Medications Prior to Admission medications   Medication Sig Start Date End Date Taking? Authorizing Provider  amLODipine (NORVASC) 10 MG tablet TAKE 1 TABLET BY MOUTH EVERY DAY 07/30/22   Kerri Perches, MD  azelastine (ASTELIN) 0.1 % nasal spray Place 2 sprays into both nostrils 2 (two) times daily. Use in each nostril as directed 01/03/22   Kerri Perches, MD  cetirizine (ZYRTEC) 10 MG tablet Take 10 mg by mouth daily.    [provider]  cetirizine (ZYRTEC) 10 MG tablet Take 1 tablet (10 mg total) by mouth 2 (two) times daily. 01/03/22   Kerri Perches, MD  chlorthalidone (HYGROTON) 25 MG tablet TAKE 1 TABLET (25 MG TOTAL) BY MOUTH DAILY. 07/30/22   Kerri Perches, MD  EPINEPHrine 0.3 mg/0.3 mL IJ SOAJ injection Inject 0.3 mg into the muscle once.  07/20/17   [provider]  esomeprazole (NEXIUM) 40 MG capsule Take 1 capsule (40 mg total) by mouth daily. 01/03/22   Kerri Perches, MD  fluticasone (FLONASE) 50 MCG/ACT nasal spray Place 2 sprays into both nostrils daily. 01/03/22   Kerri Perches, MD  potassium chloride SA (KLOR-CON M) 20 MEQ tablet Take 1 tablet (20 mEq total) by mouth 2 (two) times daily. 12/31/21   Burgess Amor, PA-C  sildenafil (VIAGRA) 100 MG tablet Take 1 tablet (100 mg total) by mouth as needed for erectile dysfunction. 11/06/21   McKenzie, Mardene Celeste, MD  Testosterone Undecanoate (JATENZO)  158 MG CAPS Take 1 capsule (158 mg total) by mouth 2 (two) times daily. 02/14/22   McKenzie, Mardene Celeste, MD      Allergies    Sulfa antibiotics, Ace inhibitors, No known allergies, Spironolactone, Sulfasalazine, Sulfonamide derivatives, and Sulfamethoxazole-trimethoprim    Review of Systems   Review of Systems  Cardiovascular:  Positive for chest pain.  All other systems reviewed and are negative.   Physical Exam Updated Vital Signs BP 110/84   Pulse 72   Temp 98.3 F (36.8 C) (Oral)   Resp (!) 22   Ht 5\' 11"  (1.803 m)   Wt 97.5 kg   SpO2 97%   BMI 29.99 kg/m  Physical Exam Cardiovascular:     Rate and Rhythm: Normal rate and regular rhythm.     Pulses:          Radial pulses are 2+ on the right side.     Heart sounds: Normal heart sounds.  Pulmonary:     Breath sounds: Normal breath sounds.  Chest:     Chest wall: Tenderness present.     Comments: Patient diffusely tender over her entire left pectoris muscle.  No overlying erythema, induration or fluctuance. Musculoskeletal:        General: Normal range of motion.     Cervical back: Normal range of motion.  Skin:    General: Skin is warm.  Capillary Refill: Capillary refill takes less than 2 seconds.  Neurological:     Mental Status: He is alert.  Psychiatric:        Mood and Affect: Mood normal.        Behavior: Behavior normal.     ED Results / Procedures / Treatments   Labs (all labs ordered are listed, but only abnormal results are displayed) Labs Reviewed  BASIC METABOLIC PANEL - Abnormal; Notable for the following components:      Result Value   Creatinine, Ser 1.42 (*)    GFR, Estimated 58 (*)    All other components within normal limits  CBC - Abnormal; Notable for the following components:   RBC 6.21 (*)    Hemoglobin 19.0 (*)    HCT 55.4 (*)    All other components within normal limits  TROPONIN I (HIGH SENSITIVITY)  TROPONIN I (HIGH SENSITIVITY)    EKG EKG  Interpretation Date/Time:  Sunday August 31 2022 11:41:46 EDT Ventricular Rate:  85 PR Interval:  128 QRS Duration:  96 QT Interval:  364 QTC Calculation: 433 R Axis:   75  Text Interpretation: Normal sinus rhythm Right atrial enlargement ST & T wave abnormality, consider inferior ischemia Abnormal ECG When compared with ECG of 30-Aug-2021 21:56, PREVIOUS ECG IS PRESENT Confirmed by Estanislado Pandy [phone]) on 08/31/2022 1:13:21 PM  Radiology DG Chest 2 View  Result Date: 08/31/2022 CLINICAL DATA:  Chest pain EXAM: CHEST - 2 VIEW COMPARISON:  08/30/2021 FINDINGS: Heart size is normal. No pleural fluid or interstitial edema. No airspace consolidation. 1.3 cm nodular density noted within the lateral left lower lobe. This is indeterminate, and not seen on previous imaging. Visualized osseous structures appear unremarkable. IMPRESSION: 1. No acute cardiopulmonary disease. 2. 1.3 cm nodular density within the lateral left lower lobe. This is indeterminate. CT chest without contrast recommended to further evaluate. Electronically Signed   By: Signa Kell M.D.   On: 08/31/2022 13:02    Procedures Procedures    Medications Ordered in ED Medications  lidocaine (LIDODERM) 5 % 1 patch (1 patch Transdermal Patch Applied 08/31/22 1330)  acetaminophen (TYLENOL) tablet 1,000 mg (1,000 mg Oral Given 08/31/22 1329)    ED Course/ Medical Decision Making/ A&P Clinical Course as of 08/31/22 1437  Sun Aug 31, 2022  1344 Troponin I (High Sensitivity): 5 ACS unlikely. [TY]    Clinical Course User Index [TY] Coral Spikes, DO                                 Medical Decision Making Well-appearing 57 year old male present emergency department with chest pain x 3 days.  Is afebrile nontachycardic physical exam with reproducible chest pain to his left pectoralis muscle.  His EKG with no ST segment changes acute ischemia, similar to prior.  Initial troponin is 5.  With duration of symptoms for 3 days no need to  repeat.  His chest x-ray without pneumonia pneumothorax.  He does have elevated hemoglobin, appears he is aware and is following with endocrinology outpatient.  Minor elevation in his creatinine, but appears to be at his baseline.  Patient treated supportively with Tylenol and lidocaine patches with improvement of his pain suggesting musculoskeletal etiology.  Low risk for PE based on Wells criteria.  Discussed follow-up with his PCP/cardiologist.  Patient agreeable to plan.  Return precautions given.  Amount and/or Complexity of Data Reviewed External Data Reviewed: ECG.  Details: ECG similar to prior. Labs: ordered. Decision-making details documented in ED Course. Radiology: ordered.  Risk OTC drugs. Prescription drug management. Decision regarding hospitalization. Risk Details: Negative troponin unchanged EKG.  Stable vitals.  Reassuring exam.  Patient intermediate heart score, appropriate for outpatient follow-up.         Final Clinical Impression(s) / ED Diagnoses Final diagnoses:  None    Rx / DC Orders ED Discharge Orders     None         Coral Spikes, DO 08/31/22 1437

## 2022-08-31 NOTE — Discharge Instructions (Signed)
Take over-the-counter Tylenol alternating with Motrin for your pain.  Please follow-up with your primary doctor.  Turn immediately develop fevers, chills, worsening pain, shortness of breath, lightheadedness, dizziness, passout or you develop any new or worsening symptoms that are concerning to you.

## 2022-08-31 NOTE — ED Notes (Signed)
Discharge instructions reviewed with patient. Pt refused vital signs upon discharge. Patient verbalized understanding and did not have any further questions. Pt in no acute distress, pt a&ox4.

## 2022-08-31 NOTE — ED Triage Notes (Signed)
Pt came in via POV d/t CP that started last night around 11pm. States it felt like a "knot" in his chest & when he would raise his Lt arm he would feel SOB, endorses difficulty laying on the Lt side when sleeping d/t discomfort. A/Ox4, rates pain 10/10 when moving his Lt arm & no pain when he is still. Does report having frequent CP & does see a cardiologist for this.

## 2022-09-17 ENCOUNTER — Ambulatory Visit (INDEPENDENT_AMBULATORY_CARE_PROVIDER_SITE_OTHER): Payer: 59 | Admitting: Urology

## 2022-09-17 VITALS — BP 128/91 | HR 90

## 2022-09-17 DIAGNOSIS — N5201 Erectile dysfunction due to arterial insufficiency: Secondary | ICD-10-CM

## 2022-09-17 DIAGNOSIS — E291 Testicular hypofunction: Secondary | ICD-10-CM

## 2022-09-17 DIAGNOSIS — R7989 Other specified abnormal findings of blood chemistry: Secondary | ICD-10-CM

## 2022-09-17 NOTE — Progress Notes (Signed)
09/17/2022 12:07 PM   Evan Jacobson [birth year] [id number]  Referring provider: Kerri Perches, MD [address] [place],  [place] [address]  Followup hypogonadism   HPI: Evan Jacobson is a 57yo here for followup for hypogomadism. He was initially on 200mg  every 2 weeks. His testosterone was over 1000 and the [place] decreased his dose to 100mg  every 2 weeks. He is on IM testosterone 100mg  every 2 weeks. Brooks Sailors was denied by the [place].  Testosterone 584 and hemoglobin 18.5.   PMH: Past Medical History:  Diagnosis Date   Allergic rhinitis    Bipolar disorder (HCC) 08/25/2011   Chronic back pain    Concussion 2012   GERD (gastroesophageal reflux disease)    Groin pain, chronic, right 06/28/2014   Heart palpitations 06/18/2016   Hypertension    Persistent adjustment disorder 12/05/2015   Chronic adjustment disorder diagnosed by [facility] and counseling recomended   PTSD (post-traumatic stress disorder)    Sinusitis    Skin lump of leg, left 11/17/2017   Vertigo 01/08/2017    Surgical History: Past Surgical History:  Procedure Laterality Date   COLONOSCOPY N/A 08/12/2017   Procedure: COLONOSCOPY;  Surgeon: Malissa Hippo, MD;  Location: [facility];  Service: Endoscopy;  Laterality: N/A;  730   NO PAST SURGERIES      Home Medications:  Allergies as of 09/17/2022       Reactions   Sulfa Antibiotics Anaphylaxis   Ace Inhibitors Other (See Comments)   Feels as though throat is closing up intermittently   No Known Allergies    Other reaction(s): MALE ERECTILE DISORDER   Spironolactone Other (See Comments)   Breast enlargement and tenderness   Sulfasalazine Other (See Comments)   Tongue swelled   Sulfonamide Derivatives Other (See Comments)   Tongue swelled   Sulfamethoxazole-trimethoprim Hives, Rash        Medication List        Accurate as of September 17, 2022 12:07 PM. If you have any questions, ask your nurse or doctor.          amLODipine 10 MG  tablet Commonly known as: NORVASC TAKE 1 TABLET BY MOUTH EVERY DAY   azelastine 0.1 % nasal spray Commonly known as: ASTELIN Place 2 sprays into both nostrils 2 (two) times daily. Use in each nostril as directed   cetirizine 10 MG tablet Commonly known as: ZYRTEC Take 10 mg by mouth daily.   cetirizine 10 MG tablet Commonly known as: ZYRTEC Take 1 tablet (10 mg total) by mouth 2 (two) times daily.   chlorthalidone 25 MG tablet Commonly known as: HYGROTON TAKE 1 TABLET (25 MG TOTAL) BY MOUTH DAILY.   EPINEPHrine 0.3 mg/0.3 mL Soaj injection Commonly known as: EPI-PEN Inject 0.3 mg into the muscle once.   esomeprazole 40 MG capsule Commonly known as: NexIUM Take 1 capsule (40 mg total) by mouth daily.   fluticasone 50 MCG/ACT nasal spray Commonly known as: FLONASE Place 2 sprays into both nostrils daily.   Jatenzo 158 MG Caps Generic drug: Testosterone Undecanoate Take 1 capsule (158 mg total) by mouth 2 (two) times daily.   lidocaine 4 % Commonly known as: HM Lidocaine Patch Place 1 patch onto the skin daily.   potassium chloride SA 20 MEQ tablet Commonly known as: KLOR-CON M Take 1 tablet (20 mEq total) by mouth 2 (two) times daily.   sildenafil 100 MG tablet Commonly known as: VIAGRA Take 1 tablet (100 mg total) by  mouth as needed for erectile dysfunction.   testosterone cypionate 200 MG/ML injection Commonly known as: DEPOTESTOSTERONE CYPIONATE 100 mg every 14 (fourteen) days.        Allergies:  Allergies  Allergen Reactions   Sulfa Antibiotics Anaphylaxis   Ace Inhibitors Other (See Comments)    Feels as though throat is closing up intermittently   No Known Allergies     Other reaction(s): MALE ERECTILE DISORDER   Spironolactone Other (See Comments)    Breast enlargement and tenderness   Sulfasalazine Other (See Comments)    Tongue swelled    Sulfonamide Derivatives Other (See Comments)    Tongue swelled   Sulfamethoxazole-Trimethoprim Hives  and Rash    Family History: Family History  Problem Relation Age of Onset   Stroke Father    Hypertension Father    Hypertension Mother        GAB   Heart disease Mother        Angina   Hypertension Sister    Hypertension Sister    Hypertension Brother    Hypertension Brother    Colon cancer Neg Hx     Social History:  reports that he has never smoked. He has never used smokeless tobacco. He reports current alcohol use. He reports that he does not use drugs.  ROS: All other review of systems were reviewed and are negative except what is noted above in HPI  Physical Exam: BP (!) 128/91   Pulse 90   Constitutional:  Alert and oriented, No acute distress. HEENT: [place] AT, moist mucus membranes.  Trachea midline, no masses. Cardiovascular: No clubbing, cyanosis, or edema. Respiratory: Normal respiratory effort, no increased work of breathing. GI: Abdomen is soft, nontender, nondistended, no abdominal masses GU: No CVA tenderness.  Lymph: No cervical or inguinal lymphadenopathy. Skin: No rashes, bruises or suspicious lesions. Neurologic: Grossly intact, no focal deficits, moving all 4 extremities. Psychiatric: Normal mood and affect.  Laboratory Data: Lab Results  Component Value Date   WBC 4.3 08/31/2022   HGB 19.0 (H) 08/31/2022   HCT 55.4 (H) 08/31/2022   MCV 89.2 08/31/2022   PLT 237 08/31/2022    Lab Results  Component Value Date   CREATININE 1.42 (H) 08/31/2022    Lab Results  Component Value Date   PSA 1.0 06/03/2019   PSA 1.2 07/22/2018   PSA 1.0 06/18/2016    Lab Results  Component Value Date   TESTOSTERONE 584 08/18/2022    Lab Results  Component Value Date   HGBA1C 5.1 04/24/2021    Urinalysis    Component Value Date/Time   COLORURINE YELLOW 07/28/2013 2028   APPEARANCEUR Clear 04/08/2021 1450   LABSPEC 1.015 07/28/2013 2028   PHURINE 6.0 07/28/2013 2028   GLUCOSEU Negative 04/08/2021 1450   HGBUR TRACE (A) 07/28/2013 2028   HGBUR  negative 12/05/2008 1305   BILIRUBINUR Negative 04/08/2021 1450   KETONESUR NEGATIVE 07/28/2013 2028   PROTEINUR Negative 04/08/2021 1450   PROTEINUR NEGATIVE 07/28/2013 2028   UROBILINOGEN 0.2 07/28/2013 2028   NITRITE Negative 04/08/2021 1450   NITRITE NEGATIVE 07/28/2013 2028   LEUKOCYTESUR Negative 04/08/2021 1450    Lab Results  Component Value Date   LABMICR Comment 04/08/2021   WBCUA None seen 09/05/2020   LABEPIT 0-10 09/05/2020   BACTERIA None seen 09/05/2020    Pertinent Imaging:  No results found for this or any previous visit.  No results found for this or any previous visit.  No results found for this or any  previous visit.  No results found for this or any previous visit.  No results found for this or any previous visit.  No valid procedures specified. No results found for this or any previous visit.  No results found for this or any previous visit.   Assessment & Plan:    1. Low testosterone -We will proceed with IM testosterone 100mg  every 7 days -followup 3 months with testosterone labs     No follow-ups on file.  Wilkie Aye, MD  [facility] [place]

## 2022-09-24 ENCOUNTER — Encounter: Payer: Self-pay | Admitting: Urology

## 2022-09-24 NOTE — Patient Instructions (Signed)

## 2022-10-06 ENCOUNTER — Telehealth (INDEPENDENT_AMBULATORY_CARE_PROVIDER_SITE_OTHER): Payer: Self-pay | Admitting: Gastroenterology

## 2022-10-06 NOTE — Telephone Encounter (Signed)
Who is your primary care physician: Syliva Overman  Reasons for the colonoscopy: 5 year recall  Have you had a colonoscopy before?  Yes 2019  Do you have family history of colon cancer? no  Previous colonoscopy with polyps removed? no  Do you have a history colorectal cancer?   no  Are you diabetic? If yes, Type 1 or Type 2?    no  Do you have a prosthetic or mechanical heart valve? no  Do you have a pacemaker/defibrillator?   no  Have you had endocarditis/atrial fibrillation? no  Have you had joint replacement within the last 12 months?  no  Do you tend to be constipated or have to use laxatives? no  Do you have any history of drugs or alchohol?  no  Do you use supplemental oxygen?  no  Have you had a stroke or heart attack within the last 6 months? no  Do you take weight loss medication?  no    Do you take any blood-thinning medications such as: (aspirin, warfarin, Plavix, Aggrenox)  no  If yes we need the name, milligram, dosage and who is prescribing doctor  Current Outpatient Medications on File Prior to Visit  Medication Sig Dispense Refill   amLODipine (NORVASC) 10 MG tablet TAKE 1 TABLET BY MOUTH EVERY DAY 90 tablet 1   azelastine (ASTELIN) 0.1 % nasal spray Place 2 sprays into both nostrils 2 (two) times daily. Use in each nostril as directed 30 mL 12   cetirizine (ZYRTEC) 10 MG tablet Take 10 mg by mouth daily.     cetirizine (ZYRTEC) 10 MG tablet Take 1 tablet (10 mg total) by mouth 2 (two) times daily. 60 tablet 2   chlorthalidone (HYGROTON) 25 MG tablet TAKE 1 TABLET (25 MG TOTAL) BY MOUTH DAILY. 90 tablet 1   EPINEPHrine 0.3 mg/0.3 mL IJ SOAJ injection Inject 0.3 mg into the muscle once.      esomeprazole (NEXIUM) 40 MG capsule Take 1 capsule (40 mg total) by mouth daily. 30 capsule 3   fluticasone (FLONASE) 50 MCG/ACT nasal spray Place 2 sprays into both nostrils daily. 16 g 6   lidocaine (HM LIDOCAINE PATCH) 4 % Place 1 patch onto the skin daily. 7 patch  0   potassium chloride SA (KLOR-CON M) 20 MEQ tablet Take 1 tablet (20 mEq total) by mouth 2 (two) times daily. 14 tablet 0   sildenafil (VIAGRA) 100 MG tablet Take 1 tablet (100 mg total) by mouth as needed for erectile dysfunction. 10 tablet 6   testosterone cypionate (DEPOTESTOSTERONE CYPIONATE) 200 MG/ML injection 100 mg every 14 (fourteen) days.     No current facility-administered medications on file prior to visit.    Allergies  Allergen Reactions   Sulfa Antibiotics Anaphylaxis   Ace Inhibitors Other (See Comments)    Feels as though throat is closing up intermittently   No Known Allergies     Other reaction(s): MALE ERECTILE DISORDER   Spironolactone Other (See Comments)    Breast enlargement and tenderness   Sulfasalazine Other (See Comments)    Tongue swelled    Sulfonamide Derivatives Other (See Comments)    Tongue swelled   Sulfamethoxazole-Trimethoprim Hives and Rash     Pharmacy: [facility]  Primary Insurance Name: [facility]  Best number where you can be reached: [phone]

## 2022-10-07 NOTE — Telephone Encounter (Signed)
Room 1, thanks

## 2022-10-24 ENCOUNTER — Telehealth: Payer: Self-pay | Admitting: Family Medicine

## 2022-10-24 NOTE — Telephone Encounter (Signed)
He can do a Dispensing optician if he would like an antibiotic called in without an appt. All requests for antibiotics have to have an appt,  either in office or video.

## 2022-10-24 NOTE — Telephone Encounter (Signed)
Patient calling wanting a Rx called in for a sinus infection- said he has been able to get one before. Requesting a call back from [place] if possible. Thank you

## 2022-10-24 NOTE — Telephone Encounter (Signed)
LMOVM to call back 

## 2022-10-27 ENCOUNTER — Ambulatory Visit
Admission: EM | Admit: 2022-10-27 | Discharge: 2022-10-27 | Disposition: A | Discharge status: Home / Self Care | Payer: No Typology Code available for payment source | Attending: Family Medicine | Admitting: Family Medicine

## 2022-10-27 DIAGNOSIS — B9789 Other viral agents as the cause of diseases classified elsewhere: Secondary | ICD-10-CM | POA: Diagnosis not present

## 2022-10-27 DIAGNOSIS — Z1152 Encounter for screening for COVID-19: Secondary | ICD-10-CM | POA: Insufficient documentation

## 2022-10-27 DIAGNOSIS — J069 Acute upper respiratory infection, unspecified: Secondary | ICD-10-CM | POA: Insufficient documentation

## 2022-10-27 DIAGNOSIS — J029 Acute pharyngitis, unspecified: Secondary | ICD-10-CM | POA: Diagnosis present

## 2022-10-27 LAB — POCT RAPID STREP A (OFFICE): Rapid Strep A Screen: NEGATIVE

## 2022-10-27 MED ORDER — PROMETHAZINE-DM 6.25-15 MG/5ML PO SYRP: 5.0000 mL | ORAL_SOLUTION | Freq: Four times a day (QID) | ORAL | 0 refills | Status: AC | PRN

## 2022-10-27 MED ORDER — LIDOCAINE VISCOUS HCL 2 % MT SOLN: 10.0000 mL | OROMUCOSAL | 0 refills | Status: AC | PRN

## 2022-10-27 MED ORDER — PEG 3350-KCL-NA BICARB-NACL 420 G PO SOLR: 4000.0000 mL | Freq: Once | ORAL | 0 refills | Status: AC

## 2022-10-27 NOTE — Discharge Instructions (Signed)
Your strep test today was negative, I have sent out a throat culture to be sure but I suspect her symptoms to be viral at this time.  Your COVID test to be back tomorrow and someone should call if positive to discuss if you would like an antiviral medication at that time.  I have sent over some medications to help with your symptoms as well, lidocaine to gargle for the sore throat and a cough syrup to help with this.  You may also take over-the-counter cold and congestion medications such as Coricidin HBP, plain Mucinex and use sinus rinses.

## 2022-10-27 NOTE — Telephone Encounter (Signed)
Pt left voicemail returning call Returned call to patient. Pt scheduled for 11/26/22 at 8:15am. Prep sent to pharmacy. Instructions sent via mail. No PA needed per [facility].

## 2022-10-27 NOTE — Addendum Note (Signed)
Addended by: Marlowe Shores on: 10/27/2022 08:34 AM   Modules accepted: Orders

## 2022-10-27 NOTE — ED Triage Notes (Signed)
Sore throat x4 days, congestion, cough with yellow mucus. Taking halls with no relief.

## 2022-10-27 NOTE — ED Provider Notes (Signed)
[facility]    CSN: [id number] Arrival date & time: 10/27/22  4098      History   Chief Complaint Chief Complaint  Patient presents with   Sore Throat    HPI Evan Jacobson is a 57 y.o. male.   Patient presenting today with a history of sore throat, congestion, cough.  Denies fever, chills, body aches, chest pain, shortness of breath, abdominal pain, nausea vomiting or diarrhea.  So far trying Hall's, typical allergy regimen of nasal sprays with minimal relief.  No known sick contacts recently.    Past Medical History:  Diagnosis Date   Allergic rhinitis    Bipolar disorder (HCC) 08/25/2011   Chronic back pain    Concussion 2012   GERD (gastroesophageal reflux disease)    Groin pain, chronic, right 06/28/2014   Heart palpitations 06/18/2016   Hypertension    Persistent adjustment disorder 12/05/2015   Chronic adjustment disorder diagnosed by [facility] and counseling recomended   PTSD (post-traumatic stress disorder)    Sinusitis    Skin lump of leg, left 11/17/2017   Vertigo 01/08/2017    Patient Active Problem List   Diagnosis Date Noted   Memory loss of unknown cause 06/16/2021   Sensorineural hearing loss, bilateral 04/12/2021   Light headedness 10/23/2020   Snoring 07/18/2020   Fatigue 07/18/2020   Low testosterone in male 07/18/2020   GAD (generalized anxiety disorder) 05/28/2020   Degeneration of intervertebral disc 05/03/2020   Major depressive disorder, recurrent episode, moderate (HCC) 05/03/2020   Pain in joint, lower leg 05/03/2020   Low back pain with left-sided sciatica 03/10/2020   ETD (Eustachian tube dysfunction), bilateral 01/11/2020   Anxiety 12/13/2019   Constipation 11/28/2019   Tachycardia 08/17/2019   Wrist pain, right 08/17/2019   Vitamin D deficiency 06/02/2019   Tinea versicolor 04/05/2019   Other allergic rhinitis 06/30/2017   Seizure (HCC) 03/24/2017   Headache disorder 02/23/2017   Syncope 02/06/2017   Dizziness 01/08/2017    Issue of medical certificate for disability examination 10/25/2014   Obesity (BMI 30.0-34.9) 05/20/2014   Back pain with left-sided radiculopathy 01/16/2014   Palpitations 01/16/2014   PTSD (post-traumatic stress disorder) 07/29/2013   Depression 12/13/2010   Rhinitis, chronic 12/05/2008   Essential hypertension 06/19/2008   GERD 08/13/2007    Past Surgical History:  Procedure Laterality Date   COLONOSCOPY N/A 08/12/2017   Procedure: COLONOSCOPY;  Surgeon: Malissa Hippo, MD;  Location: [facility];  Service: Endoscopy;  Laterality: N/A;  730   NO PAST SURGERIES         Home Medications    Prior to Admission medications   Medication Sig Start Date End Date Taking? Authorizing Provider  amLODipine (NORVASC) 10 MG tablet TAKE 1 TABLET BY MOUTH EVERY DAY 07/30/22  Yes Kerri Perches, MD  azelastine (ASTELIN) 0.1 % nasal spray Place 2 sprays into both nostrils 2 (two) times daily. Use in each nostril as directed 01/03/22  Yes Kerri Perches, MD  cetirizine (ZYRTEC) 10 MG tablet Take 10 mg by mouth daily.   Yes [provider]  chlorthalidone (HYGROTON) 25 MG tablet TAKE 1 TABLET (25 MG TOTAL) BY MOUTH DAILY. 07/30/22  Yes Kerri Perches, MD  fluticasone (FLONASE) 50 MCG/ACT nasal spray Place 2 sprays into both nostrils daily. 01/03/22  Yes Kerri Perches, MD  lidocaine (XYLOCAINE) 2 % solution Use as directed 10 mLs in the mouth or throat every 3 (three) hours as needed. 10/27/22  Yes  Particia Nearing, PA-C  promethazine-dextromethorphan (PROMETHAZINE-DM) 6.25-15 MG/5ML syrup Take 5 mLs by mouth 4 (four) times daily as needed. 10/27/22  Yes Particia Nearing, PA-C  testosterone cypionate (DEPOTESTOSTERONE CYPIONATE) 200 MG/ML injection 100 mg every 14 (fourteen) days. 08/26/22  Yes [provider]  cetirizine (ZYRTEC) 10 MG tablet Take 1 tablet (10 mg total) by mouth 2 (two) times daily. 01/03/22   Kerri Perches, MD  EPINEPHrine 0.3  mg/0.3 mL IJ SOAJ injection Inject 0.3 mg into the muscle once.  07/20/17   [provider]  esomeprazole (NEXIUM) 40 MG capsule Take 1 capsule (40 mg total) by mouth daily. 01/03/22   Kerri Perches, MD  lidocaine (HM LIDOCAINE PATCH) 4 % Place 1 patch onto the skin daily. 08/31/22   Coral Spikes, DO  polyethylene glycol-electrolytes (NULYTELY) 420 g solution Take 4,000 mLs by mouth once for 1 dose. 10/27/22 10/27/22  Dolores Frame, MD  potassium chloride SA (KLOR-CON M) 20 MEQ tablet Take 1 tablet (20 mEq total) by mouth 2 (two) times daily. 12/31/21   Burgess Amor, PA-C  sildenafil (VIAGRA) 100 MG tablet Take 1 tablet (100 mg total) by mouth as needed for erectile dysfunction. 11/06/21   McKenzie, Mardene Celeste, MD    Family History Family History  Problem Relation Age of Onset   Stroke Father    Hypertension Father    Hypertension Mother        GAB   Heart disease Mother        Angina   Hypertension Sister    Hypertension Sister    Hypertension Brother    Hypertension Brother    Colon cancer Neg Hx     Social History Social History   Tobacco Use   Smoking status: Never   Smokeless tobacco: Never  Vaping Use   Vaping status: Never Used  Substance Use Topics   Alcohol use: Yes    Comment: occ; six pack per week   Drug use: No     Allergies   Sulfa antibiotics, Ace inhibitors, No known allergies, Spironolactone, Sulfasalazine, Sulfonamide derivatives, and Sulfamethoxazole-trimethoprim  Review of Systems Review of Systems PER HPI  Physical Exam Triage Vital Signs ED Triage Vitals [10/27/22 0857]  Encounter Vitals Group     BP (!) 152/83     Systolic BP Percentile      Diastolic BP Percentile      Pulse Rate 83     Resp 18     Temp 98.6 F (37 C)     Temp Source Oral     SpO2 95 %     Weight      Height      Head Circumference      Peak Flow      Pain Score 10     Pain Loc      Pain Education      Exclude from Growth Chart    No data  found.  Updated Vital Signs BP (!) 152/83 (BP Location: Right Arm)   Pulse 83   Temp 98.6 F (37 C) (Oral)   Resp 18   SpO2 95%   Visual Acuity Right Eye Distance:   Left Eye Distance:   Bilateral Distance:    Right Eye Near:   Left Eye Near:    Bilateral Near:     Physical Exam Vitals and nursing note reviewed.  Constitutional:      Appearance: He is well-developed.  HENT:     Head:  Atraumatic.     Right Ear: External ear normal.     Left Ear: External ear normal.     Nose: Rhinorrhea present.     Mouth/Throat:     Mouth: Mucous membranes are moist.     Pharynx: Posterior oropharyngeal erythema present. No oropharyngeal exudate.  Eyes:     Conjunctiva/sclera: Conjunctivae normal.     Pupils: Pupils are equal, round, and reactive to light.  Cardiovascular:     Rate and Rhythm: Normal rate and regular rhythm.     Heart sounds: Normal heart sounds.  Pulmonary:     Effort: Pulmonary effort is normal. No respiratory distress.     Breath sounds: No wheezing or rales.  Musculoskeletal:        General: Normal range of motion.     Cervical back: Normal range of motion and neck supple.  Lymphadenopathy:     Cervical: No cervical adenopathy.  Skin:    General: Skin is warm and dry.  Neurological:     Mental Status: He is alert and oriented to person, place, and time.  Psychiatric:        Behavior: Behavior normal.    UC Treatments / Results  Labs (all labs ordered are listed, but only abnormal results are displayed) Labs Reviewed  SARS CORONAVIRUS 2 (TAT 6-24 HRS)  CULTURE, GROUP A STREP [facility])  POCT RAPID STREP A (OFFICE)    EKG   Radiology No results found.  Procedures Procedures (including critical care time)  Medications Ordered in UC Medications - No data to display  Initial Impression / Assessment and Plan / UC Course  I have reviewed the triage vital signs and the nursing notes.  Pertinent labs & imaging results that were available during my  care of the patient were reviewed by me and considered in my medical decision making (see chart for details).     Overall vital signs and exam reassuring today, suspicious for viral respiratory infection.  Rapid strep negative, throat culture and COVID testing pending.  Treat symptoms with Phenergan DM, viscous lidocaine, supportive the care medications and home care.  Return for worsening symptoms.  Final Clinical Impressions(s) / UC Diagnoses   Final diagnoses:  Viral URI with cough     Discharge Instructions      Your strep test today was negative, I have sent out a throat culture to be sure but I suspect her symptoms to be viral at this time.  Your COVID test to be back tomorrow and someone should call if positive to discuss if you would like an antiviral medication at that time.  I have sent over some medications to help with your symptoms as well, lidocaine to gargle for the sore throat and a cough syrup to help with this.  You may also take over-the-counter cold and congestion medications such as Coricidin HBP, plain Mucinex and use sinus rinses.    ED Prescriptions     Medication Sig Dispense Auth. Provider   promethazine-dextromethorphan (PROMETHAZINE-DM) 6.25-15 MG/5ML syrup Take 5 mLs by mouth 4 (four) times daily as needed. 100 mL Particia Nearing, PA-C   lidocaine (XYLOCAINE) 2 % solution Use as directed 10 mLs in the mouth or throat every 3 (three) hours as needed. 100 mL Particia Nearing, [place]      PDMP not reviewed this encounter.   Particia Nearing, [place] 10/27/22 1246

## 2022-10-27 NOTE — Telephone Encounter (Signed)
Patient went to urgent care this morning.

## 2022-10-28 LAB — SARS CORONAVIRUS 2 (TAT 6-24 HRS): SARS Coronavirus 2: NEGATIVE

## 2022-10-30 LAB — CULTURE, GROUP A STREP (THRC)

## 2022-11-02 ENCOUNTER — Encounter (HOSPITAL_COMMUNITY): Payer: Self-pay

## 2022-11-02 ENCOUNTER — Other Ambulatory Visit: Payer: Self-pay

## 2022-11-02 ENCOUNTER — Emergency Department (HOSPITAL_COMMUNITY)
Admission: EM | Admit: 2022-11-02 | Discharge: 2022-11-02 | Disposition: A | Discharge status: Home / Self Care | Payer: 59

## 2022-11-02 ENCOUNTER — Emergency Department (HOSPITAL_COMMUNITY): Payer: 59

## 2022-11-02 DIAGNOSIS — S199XXA Unspecified injury of neck, initial encounter: Secondary | ICD-10-CM | POA: Diagnosis present

## 2022-11-02 DIAGNOSIS — S161XXA Strain of muscle, fascia and tendon at neck level, initial encounter: Secondary | ICD-10-CM | POA: Insufficient documentation

## 2022-11-02 DIAGNOSIS — R1084 Generalized abdominal pain: Secondary | ICD-10-CM | POA: Diagnosis not present

## 2022-11-02 DIAGNOSIS — Y92410 Unspecified street and highway as the place of occurrence of the external cause: Secondary | ICD-10-CM | POA: Diagnosis not present

## 2022-11-02 DIAGNOSIS — M5441 Lumbago with sciatica, right side: Secondary | ICD-10-CM | POA: Diagnosis not present

## 2022-11-02 DIAGNOSIS — M5442 Lumbago with sciatica, left side: Secondary | ICD-10-CM | POA: Insufficient documentation

## 2022-11-02 DIAGNOSIS — I10 Essential (primary) hypertension: Secondary | ICD-10-CM | POA: Insufficient documentation

## 2022-11-02 DIAGNOSIS — Z79899 Other long term (current) drug therapy: Secondary | ICD-10-CM | POA: Diagnosis not present

## 2022-11-02 MED ORDER — METHOCARBAMOL 500 MG PO TABS: 500.0000 mg | ORAL_TABLET | Freq: Three times a day (TID) | ORAL | 0 refills | Status: DC

## 2022-11-02 MED ORDER — METHOCARBAMOL 500 MG PO TABS
500.0000 mg | ORAL_TABLET | Freq: Once | ORAL | Status: AC
  Administered 2022-11-02: 500 mg via ORAL
  Filled 2022-11-02: qty 1

## 2022-11-02 MED ORDER — LIDOCAINE 5 % EX PTCH: 1.0000 | MEDICATED_PATCH | CUTANEOUS | 0 refills | Status: AC

## 2022-11-02 MED ORDER — LIDOCAINE 5 % EX PTCH
1.0000 | MEDICATED_PATCH | CUTANEOUS | Status: DC
  Administered 2022-11-02: 1 via TRANSDERMAL
  Filled 2022-11-02: qty 1

## 2022-11-02 MED ORDER — OXYCODONE-ACETAMINOPHEN 5-325 MG PO TABS
1.0000 | ORAL_TABLET | Freq: Once | ORAL | Status: AC
  Administered 2022-11-02: 1 via ORAL
  Filled 2022-11-02: qty 1

## 2022-11-02 MED ORDER — OXYCODONE-ACETAMINOPHEN 5-325 MG PO TABS
1.0000 | ORAL_TABLET | ORAL | 0 refills | Status: DC | PRN
Start: 2022-11-02 — End: 2022-11-17

## 2022-11-02 NOTE — ED Notes (Signed)
ED Provider at bedside. 
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
## 2022-11-02 NOTE — Discharge Instructions (Signed)
Medication as directed.  Please follow-up with your primary care provider later this week for recheck.
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
## 2022-11-02 NOTE — ED Notes (Signed)
Pt ambulated to lobby with d/c paperwork. Verbalizes d/c teaching and follow up. Called family to come pick him up.

## 2022-11-02 NOTE — ED Notes (Signed)
Patient transported to CT 

## 2022-11-03 ENCOUNTER — Telehealth: Payer: Self-pay | Admitting: Family Medicine
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
## 2022-11-05 NOTE — Telephone Encounter (Signed)
Patient aware.

## 2022-11-10 ENCOUNTER — Ambulatory Visit: Payer: 59 | Admitting: Internal Medicine

## 2022-11-10 ENCOUNTER — Encounter: Payer: Self-pay | Admitting: Internal Medicine

## 2022-11-10 VITALS — BP 126/86 | HR 94 | Ht 71.0 in | Wt 211.4 lb

## 2022-11-10 DIAGNOSIS — M51369 Other intervertebral disc degeneration, lumbar region without mention of lumbar back pain or lower extremity pain: Secondary | ICD-10-CM

## 2022-11-10 DIAGNOSIS — Z09 Encounter for follow-up examination after completed treatment for conditions other than malignant neoplasm: Secondary | ICD-10-CM | POA: Diagnosis not present

## 2022-11-10 DIAGNOSIS — F411 Generalized anxiety disorder: Secondary | ICD-10-CM | POA: Diagnosis not present

## 2022-11-10 DIAGNOSIS — F4310 Post-traumatic stress disorder, unspecified: Secondary | ICD-10-CM

## 2022-11-10 MED ORDER — SERTRALINE HCL 25 MG PO TABS
25.0000 mg | ORAL_TABLET | Freq: Every day | ORAL | 3 refills | Status: DC
Start: 2022-11-10 — End: 2022-12-02
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
## 2022-11-10 NOTE — Patient Instructions (Signed)
Please start taking Sertraline as prescribed.  Please avoid driving in the evening for next few days.
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
## 2022-11-13 ENCOUNTER — Encounter (HOSPITAL_COMMUNITY): Payer: Self-pay

## 2022-11-13 ENCOUNTER — Other Ambulatory Visit: Payer: Self-pay

## 2022-11-13 ENCOUNTER — Emergency Department (HOSPITAL_COMMUNITY)
Admission: EM | Admit: 2022-11-13 | Discharge: 2022-11-13 | Disposition: A | Discharge status: Home / Self Care | Payer: No Typology Code available for payment source | Attending: Emergency Medicine | Admitting: Emergency Medicine

## 2022-11-13 DIAGNOSIS — M545 Low back pain, unspecified: Secondary | ICD-10-CM | POA: Diagnosis present

## 2022-11-13 DIAGNOSIS — M62830 Muscle spasm of back: Secondary | ICD-10-CM | POA: Insufficient documentation

## 2022-11-13 MED ORDER — DEXAMETHASONE SODIUM PHOSPHATE 10 MG/ML IJ SOLN
10.0000 mg | Freq: Once | INTRAMUSCULAR | Status: AC
  Administered 2022-11-13: 10 mg via INTRAMUSCULAR
  Filled 2022-11-13: qty 1

## 2022-11-13 MED ORDER — KETOROLAC TROMETHAMINE 60 MG/2ML IM SOLN
60.0000 mg | Freq: Once | INTRAMUSCULAR | Status: AC
  Administered 2022-11-13: 60 mg via INTRAMUSCULAR
  Filled 2022-11-13: qty 2
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
## 2022-11-13 NOTE — Discharge Instructions (Signed)
I have reviewed all of your CT scans, there is no signs of broken bones, the pain that you are having is from the muscle spasms and for that I would take cyclobenzaprine 10 mg every 8 hours as needed.  I would combine this with ibuprofen or Aleve, you can follow-up with your doctor within the next few days but be aware that you will need physical therapy for this as this is going to continue to be a bother for you for the next couple of weeks or even longer given your history of back discomfort.  Thank you for allowing [place] to treat you in the emergency department today.  After reviewing your examination and potential testing that was done it appears that you are safe to go home.  I would like for you to follow-up with your doctor within the next several days, have them obtain your records and follow-up with them to review all potential tests and results from your visit.  If you should develop severe or worsening symptoms return to the emergency department immediately
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
## 2022-11-14 ENCOUNTER — Encounter: Payer: Self-pay | Admitting: Internal Medicine

## 2022-11-17 ENCOUNTER — Other Ambulatory Visit: Payer: Self-pay | Admitting: Internal Medicine

## 2022-11-17 DIAGNOSIS — M51369 Other intervertebral disc degeneration, lumbar region without mention of lumbar back pain or lower extremity pain: Secondary | ICD-10-CM

## 2022-11-17 MED ORDER — OXYCODONE-ACETAMINOPHEN 10-325 MG PO TABS
1.0000 | ORAL_TABLET | Freq: Three times a day (TID) | ORAL | 0 refills | Status: AC | PRN
Start: 2022-11-17 — End: ?

## 2022-11-20 ENCOUNTER — Other Ambulatory Visit: Payer: Self-pay | Admitting: Sports Medicine

## 2022-11-20 DIAGNOSIS — F4310 Post-traumatic stress disorder, unspecified: Secondary | ICD-10-CM

## 2022-11-20 DIAGNOSIS — M5416 Radiculopathy, lumbar region: Secondary | ICD-10-CM

## 2022-11-25 ENCOUNTER — Other Ambulatory Visit: Payer: Self-pay | Admitting: Family Medicine

## 2022-11-25 DIAGNOSIS — I10 Essential (primary) hypertension: Secondary | ICD-10-CM

## 2022-11-26 ENCOUNTER — Other Ambulatory Visit: Payer: Self-pay

## 2022-11-26 ENCOUNTER — Ambulatory Visit (HOSPITAL_COMMUNITY)
Admission: RE | Admit: 2022-11-26 | Discharge: 2022-11-26 | Disposition: A | Discharge status: Home / Self Care | Payer: No Typology Code available for payment source | Attending: Gastroenterology | Admitting: Gastroenterology

## 2022-11-26 ENCOUNTER — Encounter (HOSPITAL_COMMUNITY)
Admission: RE | Disposition: A | Discharge status: Home / Self Care | Payer: Self-pay | Source: Home / Self Care | Attending: Gastroenterology

## 2022-11-26 ENCOUNTER — Ambulatory Visit (HOSPITAL_COMMUNITY): Payer: No Typology Code available for payment source | Admitting: Certified Registered"

## 2022-11-26 DIAGNOSIS — Z1211 Encounter for screening for malignant neoplasm of colon: Secondary | ICD-10-CM

## 2022-11-26 DIAGNOSIS — F319 Bipolar disorder, unspecified: Secondary | ICD-10-CM | POA: Diagnosis not present

## 2022-11-26 DIAGNOSIS — F4310 Post-traumatic stress disorder, unspecified: Secondary | ICD-10-CM | POA: Diagnosis not present

## 2022-11-26 DIAGNOSIS — K648 Other hemorrhoids: Secondary | ICD-10-CM | POA: Diagnosis not present

## 2022-11-26 DIAGNOSIS — K219 Gastro-esophageal reflux disease without esophagitis: Secondary | ICD-10-CM | POA: Insufficient documentation

## 2022-11-26 DIAGNOSIS — I10 Essential (primary) hypertension: Secondary | ICD-10-CM | POA: Diagnosis not present

## 2022-11-26 DIAGNOSIS — D126 Benign neoplasm of colon, unspecified: Secondary | ICD-10-CM

## 2022-11-26 DIAGNOSIS — Z860100 Personal history of colon polyps, unspecified: Secondary | ICD-10-CM

## 2022-11-26 DIAGNOSIS — Z860101 Personal history of adenomatous and serrated colon polyps: Secondary | ICD-10-CM

## 2022-11-26 DIAGNOSIS — D122 Benign neoplasm of ascending colon: Secondary | ICD-10-CM | POA: Diagnosis not present

## 2022-11-26 HISTORY — PX: COLONOSCOPY WITH PROPOFOL: SHX5780

## 2022-11-26 HISTORY — PX: POLYPECTOMY: SHX5525

## 2022-11-26 LAB — HM COLONOSCOPY

## 2022-11-26 SURGERY — COLONOSCOPY WITH PROPOFOL
Anesthesia: General

## 2022-11-26 MED ORDER — LACTATED RINGERS IV SOLN: INTRAVENOUS | Status: DC | PRN

## 2022-11-26 MED ORDER — PROPOFOL 1000 MG/100ML IV EMUL
INTRAVENOUS | Status: AC
  Filled 2022-11-26: qty 100

## 2022-11-26 MED ORDER — LIDOCAINE HCL (CARDIAC) PF 100 MG/5ML IV SOSY
PREFILLED_SYRINGE | INTRAVENOUS | Status: DC | PRN
  Administered 2022-11-26: 80 mg via INTRAVENOUS

## 2022-11-26 MED ORDER — PROPOFOL 10 MG/ML IV BOLUS
INTRAVENOUS | Status: DC | PRN
  Administered 2022-11-26: 100 mg via INTRAVENOUS

## 2022-11-26 MED ORDER — SODIUM CHLORIDE 0.9% FLUSH: 10.0000 mL | Freq: Two times a day (BID) | INTRAVENOUS | Status: DC

## 2022-11-26 MED ORDER — PROPOFOL 500 MG/50ML IV EMUL
INTRAVENOUS | Status: DC | PRN
  Administered 2022-11-26: 150 ug/kg/min via INTRAVENOUS

## 2022-11-26 NOTE — Transfer of Care (Addendum)
Immediate Anesthesia Transfer of Care Note  Patient: Evan Jacobson  Procedure(s) Performed: COLONOSCOPY WITH PROPOFOL POLYPECTOMY  Patient Location: PACU and Endoscopy Unit  Anesthesia Type:General  Level of Consciousness: drowsy and patient cooperative  Airway & Oxygen Therapy: Patient Spontanous Breathing and Patient connected to nasal cannula oxygen  Post-op Assessment: Report given to RN and Post -op Vital signs reviewed and stable  Post vital signs: Reviewed and stable  Last Vitals:  Vitals Value Taken Time  BP 92/72 11/26/22   0853  Temp 36.5 11/26/22   0853  Pulse 72 11/26/22   0853  Resp 16 11/26/22   0853  SpO2 97% 11/26/22   0853    Last Pain:  Vitals:   11/26/22 0814  TempSrc:   PainSc: 8       Patients Stated Pain Goal: 4 (11/26/22 0720)  Complications: No notable events documented.

## 2022-11-26 NOTE — H&P (Signed)
Evan Jacobson is an 57 y.o. male.   Chief Complaint: History of colon polyps HPI: 57 year old male with past medical history of bipolar disorder, GERD, hypertension, PTSD, rhinitis, coming for history of colon polyps.  Last colonoscopy performed in 2019, had 1 sessile serrated polyp removed.  The patient denies having any complaints such as melena, hematochezia, abdominal pain or distention, change in her bowel movement consistency or frequency, no changes in weight recently.  No family history of colorectal cancer.   Past Medical History:  Diagnosis Date   Allergic rhinitis    Bipolar disorder (HCC) 08/25/2011   Chronic back pain    Concussion 2012   GERD (gastroesophageal reflux disease)    Groin pain, chronic, right 06/28/2014   Heart palpitations 06/18/2016   Hypertension    Persistent adjustment disorder 12/05/2015   Chronic adjustment disorder diagnosed by [facility] and counseling recomended   PTSD (post-traumatic stress disorder)    Sinusitis    Skin lump of leg, left 11/17/2017   Vertigo 01/08/2017    Past Surgical History:  Procedure Laterality Date   COLONOSCOPY N/A 08/12/2017   Procedure: COLONOSCOPY;  Surgeon: Malissa Hippo, MD;  Location: [facility];  Service: Endoscopy;  Laterality: N/A;  730   NO PAST SURGERIES      Family History  Problem Relation Age of Onset   Stroke Father    Hypertension Father    Hypertension Mother        GAB   Heart disease Mother        Angina   Hypertension Sister    Hypertension Sister    Hypertension Brother    Hypertension Brother    Colon cancer Neg Hx    Social History:  reports that he has never smoked. He has never used smokeless tobacco. He reports current alcohol use. He reports that he does not use drugs.  Allergies:  Allergies  Allergen Reactions   Sulfa Antibiotics Anaphylaxis   Ace Inhibitors Other (See Comments)    Feels as though throat is closing up intermittently   No Known Allergies     Other reaction(s):  MALE ERECTILE DISORDER   Spironolactone Other (See Comments)    Breast enlargement and tenderness   Sulfasalazine Other (See Comments)    Tongue swelled    Sulfonamide Derivatives Other (See Comments)    Tongue swelled   Sulfamethoxazole-Trimethoprim Hives and Rash    Medications Prior to Admission  Medication Sig Dispense Refill   amLODipine (NORVASC) 10 MG tablet TAKE 1 TABLET BY MOUTH EVERY DAY 90 tablet 1   azelastine (ASTELIN) 0.1 % nasal spray Place 2 sprays into both nostrils 2 (two) times daily. Use in each nostril as directed 30 mL 12   cetirizine (ZYRTEC) 10 MG tablet Take 10 mg by mouth daily.     cetirizine (ZYRTEC) 10 MG tablet Take 1 tablet (10 mg total) by mouth 2 (two) times daily. 60 tablet 2   chlorthalidone (HYGROTON) 25 MG tablet TAKE 1 TABLET (25 MG TOTAL) BY MOUTH DAILY. 90 tablet 1   esomeprazole (NEXIUM) 40 MG capsule Take 1 capsule (40 mg total) by mouth daily. 30 capsule 3   fluticasone (FLONASE) 50 MCG/ACT nasal spray SPRAY 2 SPRAYS INTO EACH NOSTRIL EVERY DAY 48 mL 2   lidocaine (LIDODERM) 5 % Place 1 patch onto the skin daily. Remove & Discard patch within 12 hours or as directed by MD 30 patch 0   lidocaine (XYLOCAINE) 2 % solution Use as directed 10  mLs in the mouth or throat every 3 (three) hours as needed. 100 mL 0   methocarbamol (ROBAXIN) 500 MG tablet Take 1 tablet (500 mg total) by mouth 3 (three) times daily. 21 tablet 0   oxyCODONE-acetaminophen (PERCOCET) 10-325 MG tablet Take 1 tablet by mouth every 8 (eight) hours as needed for pain. 20 tablet 0   promethazine-dextromethorphan (PROMETHAZINE-DM) 6.25-15 MG/5ML syrup Take 5 mLs by mouth 4 (four) times daily as needed. 100 mL 0   sertraline (ZOLOFT) 25 MG tablet Take 1 tablet (25 mg total) by mouth daily. 30 tablet 3   sildenafil (VIAGRA) 100 MG tablet Take 1 tablet (100 mg total) by mouth as needed for erectile dysfunction. 10 tablet 6   testosterone cypionate (DEPOTESTOSTERONE CYPIONATE) 200 MG/ML  injection 100 mg every 14 (fourteen) days.     traZODone (DESYREL) 50 MG tablet Take 50 mg by mouth at bedtime.     EPINEPHrine 0.3 mg/0.3 mL IJ SOAJ injection Inject 0.3 mg into the muscle once.      potassium chloride SA (KLOR-CON M) 20 MEQ tablet Take 1 tablet (20 mEq total) by mouth 2 (two) times daily. 14 tablet 0    No results found for this or any previous visit (from the past 48 hour(s)). No results found.  Review of Systems  All other systems reviewed and are negative.   Blood pressure 130/83, pulse 68, temperature 97.7 F (36.5 C), temperature source Oral, resp. rate 19, SpO2 100%. Physical Exam  GENERAL: The patient is AO x3, in no acute distress. HEENT: Head is normocephalic and atraumatic. EOMI are intact. Mouth is well hydrated and without lesions. NECK: Supple. No masses LUNGS: Clear to auscultation. No presence of rhonchi/wheezing/rales. Adequate chest expansion HEART: RRR, normal s1 and s2. ABDOMEN: Soft, nontender, no guarding, no peritoneal signs, and nondistended. BS +. No masses. EXTREMITIES: Without any cyanosis, clubbing, rash, lesions or edema. NEUROLOGIC: AOx3, no focal motor deficit. SKIN: no jaundice, no rashes  Assessment/Plan 57 year old male with past medical history of bipolar disorder, GERD, hypertension, PTSD, rhinitis, coming for history of colon polyps.  Will proceed with colonoscopy.  Dolores Frame, MD 11/26/2022, 8:04 AM

## 2022-11-26 NOTE — Discharge Instructions (Signed)
You are being discharged to home.  Resume your previous diet.  We are waiting for your pathology results.  Your physician has recommended a repeat colonoscopy in seven years for surveillance.

## 2022-11-26 NOTE — Op Note (Signed)
[facility] Patient Name: Evan Jacobson Procedure Date: 11/26/2022 8:03 AM MRN: [id number] Date of Birth: 05-18-1965 Attending MD: Katrinka Blazing , , [id number] CSN: [id number] Age: 57 Admit Type: Outpatient Procedure:                Colonoscopy Indications:              High risk colon cancer surveillance: Personal                            history of sessile serrated colon polyp (less than                            10 mm in size) with no dysplasia Providers:                Katrinka Blazing, Angelica Ran, Elinor Parkinson Referring MD:              Medicines:                Monitored Anesthesia Care Complications:            No immediate complications. Estimated Blood Loss:     Estimated blood loss: none. Procedure:                Pre-Anesthesia Assessment:                           - Prior to the procedure, a History and Physical                            was performed, and patient medications, allergies                            and sensitivities were reviewed. The patient's                            tolerance of previous anesthesia was reviewed.                           - The risks and benefits of the procedure and the                            sedation options and risks were discussed with the                            patient. All questions were answered and informed                            consent was obtained.                           - ASA Grade Assessment: II - A patient with mild                            systemic disease.                           After obtaining  informed consent, the colonoscope                            was passed under direct vision. Throughout the                            procedure, the patient's blood pressure, pulse, and                            oxygen saturations were monitored continuously. The                            [id number]-HQ190L ([id number]) scope was introduced through                            the anus and advanced to  the the cecum, identified                            by appendiceal orifice and ileocecal valve. The                            colonoscopy was performed without difficulty. The                            patient tolerated the procedure well. The quality                            of the bowel preparation was adequate to identify                            polyps greater than 5 mm in size. Scope In: 8:17:27 AM Scope Out: 8:48:50 AM Scope Withdrawal Time: 0 hours 25 minutes 25 seconds  Total Procedure Duration: 0 hours 31 minutes 23 seconds  Findings:      The perianal and digital rectal examinations were normal.      A 3 mm polyp was found in the ascending colon. The polyp was sessile.       The polyp was removed with a cold biopsy forceps. Resection and       retrieval were complete.      Non-bleeding internal hemorrhoids were found during retroflexion. The       hemorrhoids were small. Impression:               - One 3 mm polyp in the ascending colon, removed                            with a cold biopsy forceps. Resected and retrieved.                           - Non-bleeding internal hemorrhoids. Moderate Sedation:      Per Anesthesia Care Recommendation:           - Discharge patient to home (ambulatory).                           - Resume previous  diet.                           - Await pathology results.                           - Repeat colonoscopy in 7 years for surveillance. Procedure Code(s):        --- Professional ---                           [id number], Colonoscopy, flexible; with biopsy, single                            or multiple Diagnosis Code(s):        --- Professional ---                           Z86.010, Personal history of colonic polyps                           D12.2, Benign neoplasm of ascending colon                           K64.8, Other hemorrhoids CPT copyright 2022 [employer]. All rights reserved. The codes documented in this report  are preliminary and upon coder review may  be revised to meet current compliance requirements. Katrinka Blazing, MD Katrinka Blazing,  11/26/2022 8:53:53 AM This report has been signed electronically. Number of Addenda: 0

## 2022-11-26 NOTE — Anesthesia Preprocedure Evaluation (Signed)
Anesthesia Evaluation  Patient identified by MRN, date of birth, ID band Patient awake    Reviewed: Allergy & Precautions, H&P , NPO status , Patient's Chart, lab work & pertinent test results, reviewed documented beta blocker date and time   Airway Mallampati: II  TM Distance: >3 FB Neck ROM: full    Dental no notable dental hx.    Pulmonary neg pulmonary ROS   Pulmonary exam normal breath sounds clear to auscultation       Cardiovascular Exercise Tolerance: Good hypertension, negative cardio ROS  Rhythm:regular Rate:Normal     Neuro/Psych  Headaches, Seizures -,  PSYCHIATRIC DISORDERS Anxiety Depression Bipolar Disorder    Neuromuscular disease negative neurological ROS  negative psych ROS   GI/Hepatic negative GI ROS, Neg liver ROS,GERD  ,,  Endo/Other  negative endocrine ROS    Renal/GU negative Renal ROS  negative genitourinary   Musculoskeletal   Abdominal   Peds  Hematology negative hematology ROS (+)   Anesthesia Other Findings   Reproductive/Obstetrics negative OB ROS                             Anesthesia Physical Anesthesia Plan  ASA: 2  Anesthesia Plan: General   Post-op Pain Management:    Induction:   PONV Risk Score and Plan: Propofol infusion  Airway Management Planned:   Additional Equipment:   Intra-op Plan:   Post-operative Plan:   Informed Consent: I have reviewed the patients History and Physical, chart, labs and discussed the procedure including the risks, benefits and alternatives for the proposed anesthesia with the patient or authorized representative who has indicated his/her understanding and acceptance.     Dental Advisory Given  Plan Discussed with: CRNA  Anesthesia Plan Comments:        Anesthesia Quick Evaluation

## 2022-11-27 ENCOUNTER — Encounter (INDEPENDENT_AMBULATORY_CARE_PROVIDER_SITE_OTHER): Payer: Self-pay | Admitting: *Deleted

## 2022-11-27 LAB — SURGICAL PATHOLOGY

## 2022-11-28 NOTE — Anesthesia Postprocedure Evaluation (Signed)
Anesthesia Post Note  Patient: Evan Jacobson  Procedure(s) Performed: COLONOSCOPY WITH PROPOFOL POLYPECTOMY  Patient location during evaluation: Phase II Anesthesia Type: General Level of consciousness: awake Pain management: pain level controlled Vital Signs Assessment: post-procedure vital signs reviewed and stable Respiratory status: spontaneous breathing and respiratory function stable Cardiovascular status: blood pressure returned to baseline and stable Postop Assessment: no headache and no apparent nausea or vomiting Anesthetic complications: no Comments: Late entry   No notable events documented.   Last Vitals:  Vitals:   11/26/22 0853 11/26/22 0859  BP: 92/72 92/64  Pulse: 72 70  Resp: 16 15  Temp: 36.5 C   SpO2: 97% 100%    Last Pain:  Vitals:   11/26/22 0902  TempSrc:   PainSc: 0-No pain                 Windell Norfolk

## 2022-12-02 ENCOUNTER — Encounter (HOSPITAL_COMMUNITY): Payer: Self-pay | Admitting: Gastroenterology

## 2022-12-02 ENCOUNTER — Other Ambulatory Visit: Payer: Self-pay | Admitting: Internal Medicine

## 2022-12-02 DIAGNOSIS — F4310 Post-traumatic stress disorder, unspecified: Secondary | ICD-10-CM

## 2022-12-04 ENCOUNTER — Encounter (INDEPENDENT_AMBULATORY_CARE_PROVIDER_SITE_OTHER): Payer: Self-pay | Admitting: *Deleted

## 2022-12-07 ENCOUNTER — Other Ambulatory Visit: Payer: Self-pay | Admitting: Urology

## 2022-12-07 DIAGNOSIS — N5201 Erectile dysfunction due to arterial insufficiency: Secondary | ICD-10-CM

## 2022-12-08 ENCOUNTER — Ambulatory Visit
Admission: RE | Admit: 2022-12-08 | Discharge: 2022-12-08 | Disposition: A | Discharge status: Home / Self Care | Payer: No Typology Code available for payment source | Source: Ambulatory Visit | Attending: Sports Medicine | Admitting: Sports Medicine

## 2022-12-08 DIAGNOSIS — M5416 Radiculopathy, lumbar region: Secondary | ICD-10-CM

## 2022-12-08 DIAGNOSIS — F4310 Post-traumatic stress disorder, unspecified: Secondary | ICD-10-CM

## 2022-12-18 ENCOUNTER — Other Ambulatory Visit: Payer: No Typology Code available for payment source

## 2022-12-18 DIAGNOSIS — R7989 Other specified abnormal findings of blood chemistry: Secondary | ICD-10-CM

## 2022-12-21 LAB — CBC
Hematocrit: 48.2 % (ref 37.5–51.0)
Hemoglobin: 15.8 g/dL (ref 13.0–17.7)
MCH: 29.9 pg (ref 26.6–33.0)
MCHC: 32.8 g/dL (ref 31.5–35.7)
MCV: 91 fL (ref 79–97)
Platelets: 246 10*3/uL (ref 150–450)
RBC: 5.28 x10E6/uL (ref 4.14–5.80)
RDW: 13.2 % (ref 11.6–15.4)
WBC: 5 10*3/uL (ref 3.4–10.8)

## 2022-12-21 LAB — TESTOSTERONE,FREE AND TOTAL
Testosterone, Free: 10.1 pg/mL (ref 7.2–24.0)
Testosterone: 361 ng/dL (ref 264–916)

## 2022-12-24 ENCOUNTER — Ambulatory Visit (INDEPENDENT_AMBULATORY_CARE_PROVIDER_SITE_OTHER): Payer: No Typology Code available for payment source | Admitting: Urology

## 2022-12-24 VITALS — BP 123/80 | HR 84

## 2022-12-24 DIAGNOSIS — R7989 Other specified abnormal findings of blood chemistry: Secondary | ICD-10-CM

## 2022-12-24 DIAGNOSIS — N5201 Erectile dysfunction due to arterial insufficiency: Secondary | ICD-10-CM

## 2022-12-24 MED ORDER — TESTOSTERONE CYPIONATE 200 MG/ML IM SOLN: 100.0000 mg | INTRAMUSCULAR | 3 refills | Status: AC

## 2022-12-24 MED ORDER — SILDENAFIL CITRATE 100 MG PO TABS: 100.0000 mg | ORAL_TABLET | ORAL | 0 refills | Status: DC | PRN

## 2022-12-24 NOTE — Progress Notes (Signed)
12/24/2022 11:21 AM   Evan Jacobson [birth year] [id number]  Referring provider: Kerri Perches, MD [address] [place],  [place] [address]  hypogonadism   HPI: Evan Jacobson is a 56yo here for followup for hypogonadism. Testosterone 361 on IM testosterone 100mg  every week. His hemoglobin increased to 19 three months ago and it decreased to 15.8. He tried the testosterone gel and had an issue with transfer to his wife. Energy has improved. Good libido. He has used tadalafil previously without success for his erectile dysfunction.   PMH: Past Medical History:  Diagnosis Date   Allergic rhinitis    Bipolar disorder (HCC) 08/25/2011   Chronic back pain    Concussion 2012   GERD (gastroesophageal reflux disease)    Groin pain, chronic, right 06/28/2014   Heart palpitations 06/18/2016   Hypertension    Persistent adjustment disorder 12/05/2015   Chronic adjustment disorder diagnosed by [facility] and counseling recomended   PTSD (post-traumatic stress disorder)    Sinusitis    Skin lump of leg, left 11/17/2017   Vertigo 01/08/2017    Surgical History: Past Surgical History:  Procedure Laterality Date   COLONOSCOPY N/A 08/12/2017   Procedure: COLONOSCOPY;  Surgeon: Malissa Hippo, MD;  Location: [facility];  Service: Endoscopy;  Laterality: N/A;  730   COLONOSCOPY WITH PROPOFOL N/A 11/26/2022   Procedure: COLONOSCOPY WITH PROPOFOL;  Surgeon: Dolores Frame, MD;  Location: [facility];  Service: Gastroenterology;  Laterality: N/A;  8:15AM;ASA 1   NO PAST SURGERIES     POLYPECTOMY  11/26/2022   Procedure: POLYPECTOMY;  Surgeon: Dolores Frame, MD;  Location: [facility];  Service: Gastroenterology;;    Home Medications:  Allergies as of 12/24/2022       Reactions   Sulfa Antibiotics Anaphylaxis   Ace Inhibitors Other (See Comments)   Feels as though throat is closing up intermittently   No Known Allergies    Other reaction(s): MALE  ERECTILE DISORDER   Spironolactone Other (See Comments)   Breast enlargement and tenderness   Sulfasalazine Other (See Comments)   Tongue swelled   Sulfonamide Derivatives Other (See Comments)   Tongue swelled   Sulfamethoxazole-trimethoprim Hives, Rash        Medication List        Accurate as of December 24, 2022 11:21 AM. If you have any questions, ask your nurse or doctor.          amLODipine 10 MG tablet Commonly known as: NORVASC TAKE 1 TABLET BY MOUTH EVERY DAY   azelastine 0.1 % nasal spray Commonly known as: ASTELIN Place 2 sprays into both nostrils 2 (two) times daily. Use in each nostril as directed   cetirizine 10 MG tablet Commonly known as: ZYRTEC Take 10 mg by mouth daily.   cetirizine 10 MG tablet Commonly known as: ZYRTEC Take 1 tablet (10 mg total) by mouth 2 (two) times daily.   chlorthalidone 25 MG tablet Commonly known as: HYGROTON TAKE 1 TABLET (25 MG TOTAL) BY MOUTH DAILY.   EPINEPHrine 0.3 mg/0.3 mL Soaj injection Commonly known as: EPI-PEN Inject 0.3 mg into the muscle once.   esomeprazole 40 MG capsule Commonly known as: NexIUM Take 1 capsule (40 mg total) by mouth daily.   fluticasone 50 MCG/ACT nasal spray Commonly known as: FLONASE SPRAY 2 SPRAYS INTO EACH NOSTRIL EVERY DAY   lidocaine 2 % solution Commonly known as: XYLOCAINE Use as directed 10 mLs in the mouth or throat  every 3 (three) hours as needed.   lidocaine 5 % Commonly known as: Lidoderm Place 1 patch onto the skin daily. Remove & Discard patch within 12 hours or as directed by MD   methocarbamol 500 MG tablet Commonly known as: ROBAXIN Take 1 tablet (500 mg total) by mouth 3 (three) times daily.   oxyCODONE-acetaminophen 10-325 MG tablet Commonly known as: PERCOCET Take 1 tablet by mouth every 8 (eight) hours as needed for pain.   potassium chloride SA 20 MEQ tablet Commonly known as: KLOR-CON M Take 1 tablet (20 mEq total) by mouth 2 (two) times daily.    promethazine-dextromethorphan 6.25-15 MG/5ML syrup Commonly known as: PROMETHAZINE-DM Take 5 mLs by mouth 4 (four) times daily as needed.   sertraline 25 MG tablet Commonly known as: ZOLOFT TAKE 1 TABLET (25 MG TOTAL) BY MOUTH DAILY.   sildenafil 100 MG tablet Commonly known as: VIAGRA TAKE 1 TABLET BY MOUTH AS NEEDED FOR ERECTILE DYSFUNCTION   testosterone cypionate 200 MG/ML injection Commonly known as: DEPOTESTOSTERONE CYPIONATE 100 mg every 14 (fourteen) days.   traZODone 50 MG tablet Commonly known as: DESYREL Take 50 mg by mouth at bedtime.        Allergies:  Allergies  Allergen Reactions   Sulfa Antibiotics Anaphylaxis   Ace Inhibitors Other (See Comments)    Feels as though throat is closing up intermittently   No Known Allergies     Other reaction(s): MALE ERECTILE DISORDER   Spironolactone Other (See Comments)    Breast enlargement and tenderness   Sulfasalazine Other (See Comments)    Tongue swelled    Sulfonamide Derivatives Other (See Comments)    Tongue swelled   Sulfamethoxazole-Trimethoprim Hives and Rash    Family History: Family History  Problem Relation Age of Onset   Stroke Father    Hypertension Father    Hypertension Mother        GAB   Heart disease Mother        Angina   Hypertension Sister    Hypertension Sister    Hypertension Brother    Hypertension Brother    Colon cancer Neg Hx     Social History:  reports that he has never smoked. He has never used smokeless tobacco. He reports current alcohol use. He reports that he does not use drugs.  ROS: All other review of systems were reviewed and are negative except what is noted above in HPI  Physical Exam: BP 123/80   Pulse 84   Constitutional:  Alert and oriented, No acute distress. HEENT: [place] AT, moist mucus membranes.  Trachea midline, no masses. Cardiovascular: No clubbing, cyanosis, or edema. Respiratory: Normal respiratory effort, no increased work of breathing. GI:  Abdomen is soft, nontender, nondistended, no abdominal masses GU: No CVA tenderness.  Lymph: No cervical or inguinal lymphadenopathy. Skin: No rashes, bruises or suspicious lesions. Neurologic: Grossly intact, no focal deficits, moving all 4 extremities. Psychiatric: Normal mood and affect.  Laboratory Data: Lab Results  Component Value Date   WBC 5.0 12/18/2022   HGB 15.8 12/18/2022   HCT 48.2 12/18/2022   MCV 91 12/18/2022   PLT 246 12/18/2022    Lab Results  Component Value Date   CREATININE 1.42 (H) 08/31/2022    Lab Results  Component Value Date   PSA 1.0 06/03/2019   PSA 1.2 07/22/2018   PSA 1.0 06/18/2016    Lab Results  Component Value Date   TESTOSTERONE 361 12/18/2022    Lab Results  Component  Value Date   HGBA1C 5.1 04/24/2021    Urinalysis    Component Value Date/Time   COLORURINE YELLOW 07/28/2013 2028   APPEARANCEUR Clear 04/08/2021 1450   LABSPEC 1.015 07/28/2013 2028   PHURINE 6.0 07/28/2013 2028   GLUCOSEU Negative 04/08/2021 1450   HGBUR TRACE (A) 07/28/2013 2028   HGBUR negative 12/05/2008 1305   BILIRUBINUR Negative 04/08/2021 1450   KETONESUR NEGATIVE 07/28/2013 2028   PROTEINUR Negative 04/08/2021 1450   PROTEINUR NEGATIVE 07/28/2013 2028   UROBILINOGEN 0.2 07/28/2013 2028   NITRITE Negative 04/08/2021 1450   NITRITE NEGATIVE 07/28/2013 2028   LEUKOCYTESUR Negative 04/08/2021 1450    Lab Results  Component Value Date   LABMICR Comment 04/08/2021   WBCUA None seen 09/05/2020   LABEPIT 0-10 09/05/2020   BACTERIA None seen 09/05/2020    Pertinent Imaging:  No results found for this or any previous visit.  No results found for this or any previous visit.  No results found for this or any previous visit.  No results found for this or any previous visit.  No results found for this or any previous visit.  No valid procedures specified. No results found for this or any previous visit.  No results found for this or any  previous visit.   Assessment & Plan:    1. Low testosterone -Restart IM testosterone 100mg  every 2 weeks Followup 3 months with testosterone labs  2. Erectile dysfunction due to arterial insufficiency Sildenafil 100mg  PRN   No follow-ups on file.  Wilkie Aye, MD  [facility] [place]

## 2022-12-27 ENCOUNTER — Encounter: Payer: Self-pay | Admitting: Urology

## 2022-12-27 NOTE — Patient Instructions (Signed)

## 2023-01-13 ENCOUNTER — Ambulatory Visit: Payer: No Typology Code available for payment source | Admitting: Family Medicine

## 2023-01-13 ENCOUNTER — Encounter: Payer: Self-pay | Admitting: Family Medicine

## 2023-01-13 VITALS — BP 124/79 | HR 86 | Ht 71.0 in | Wt 215.1 lb

## 2023-01-13 DIAGNOSIS — Z0000 Encounter for general adult medical examination without abnormal findings: Secondary | ICD-10-CM | POA: Diagnosis not present

## 2023-01-13 DIAGNOSIS — Z125 Encounter for screening for malignant neoplasm of prostate: Secondary | ICD-10-CM | POA: Diagnosis not present

## 2023-01-13 DIAGNOSIS — E559 Vitamin D deficiency, unspecified: Secondary | ICD-10-CM

## 2023-01-13 DIAGNOSIS — I10 Essential (primary) hypertension: Secondary | ICD-10-CM

## 2023-01-13 DIAGNOSIS — Z13220 Encounter for screening for lipoid disorders: Secondary | ICD-10-CM

## 2023-01-13 DIAGNOSIS — E782 Mixed hyperlipidemia: Secondary | ICD-10-CM

## 2023-01-13 DIAGNOSIS — R7989 Other specified abnormal findings of blood chemistry: Secondary | ICD-10-CM

## 2023-01-13 DIAGNOSIS — E66811 Obesity, class 1: Secondary | ICD-10-CM

## 2023-01-13 MED ORDER — FLUTICASONE PROPIONATE 50 MCG/ACT NA SUSP: 2.0000 | Freq: Every day | NASAL | 2 refills | Status: AC

## 2023-01-13 NOTE — Patient Instructions (Signed)
F/U in 6 months, call if you need me sooner    CONGRATS  on improved health habits  Fasting , lipid, cmp and eGFR, PSA, tSH, vit D in am  Need Covid vaccine   It is important that you exercise regularly at least 30 minutes 5 times a week. If you develop chest pain, have severe difficulty breathing, or feel very tired, stop exercising immediately and seek medical attention   Thanks for choosing [facility], we consider it a privelige to serve you.

## 2023-01-18 ENCOUNTER — Encounter: Payer: Self-pay | Admitting: Family Medicine

## 2023-02-03 DIAGNOSIS — E785 Hyperlipidemia, unspecified: Secondary | ICD-10-CM | POA: Insufficient documentation

## 2023-02-03 DIAGNOSIS — Z0000 Encounter for general adult medical examination without abnormal findings: Secondary | ICD-10-CM | POA: Insufficient documentation

## 2023-02-03 NOTE — Assessment & Plan Note (Signed)
 Controlled, no change in medication DASH diet and commitment to daily physical activity for a minimum of 30 minutes discussed and encouraged, as a part of hypertension management. The importance of attaining a healthy weight is also discussed.     01/13/2023   10:48 AM 12/24/2022   10:52 AM 11/26/2022    8:59 AM 11/26/2022    8:53 AM 11/26/2022    7:20 AM 11/13/2022    9:00 PM 11/13/2022    8:12 PM  BP/Weight  Systolic BP 124 123 92 92 130 876   Diastolic BP 79 80 64 72 83 78   Wt. (Lbs) 215.08      204  BMI 30 kg/m2      28.45 kg/m2

## 2023-02-03 NOTE — Assessment & Plan Note (Signed)
 Improved with lifestle change  Patient re-educated about  the importance of commitment to a  minimum of 150 minutes of exercise per week as able.  The importance of healthy food choices with portion control discussed, as well as eating regularly and within a 12 hour window most days. The need to choose clean , green food 50 to 75% of the time is discussed, as well as to make water the primary drink and set a goal of 64 ounces water daily.       01/13/2023   10:48 AM 11/13/2022    8:12 PM 11/10/2022    4:14 PM  Weight /BMI  Weight 215 lb 1.3 oz 204 lb 211 lb 6.4 oz  Height 5' 11 (1.803 m) 5' 11 (1.803 m) 5' 11 (1.803 m)  BMI 30 kg/m2 28.45 kg/m2 29.48 kg/m2

## 2023-02-03 NOTE — Assessment & Plan Note (Signed)
 Well controlleed on current regime, also looking forward to the birth of his first grand child

## 2023-02-03 NOTE — Assessment & Plan Note (Signed)
.   Immunization and cancer screening needs are specifically addressed at this visit.

## 2023-02-03 NOTE — Assessment & Plan Note (Signed)
 Mannagd with hormone replacement by Urology, feels better in terms of fstigue and also has improved sexual function

## 2023-02-03 NOTE — Assessment & Plan Note (Signed)
 Hyperlipidemia:Low fat diet discussed and encouraged.   Lipid Panel  Lab Results  Component Value Date   CHOL 207 (H) 11/19/2021   HDL 54 11/19/2021   LDLCALC 143 (H) 11/19/2021   TRIG 53 11/19/2021   CHOLHDL 3.8 11/19/2021     Updated lab needed at/ before next visit.

## 2023-02-14 LAB — LIPID PANEL
Chol/HDL Ratio: 3.7 {ratio} (ref 0.0–5.0)
Cholesterol, Total: 202 mg/dL — ABNORMAL HIGH (ref 100–199)
HDL: 55 mg/dL (ref >39)
LDL Chol Calc (NIH): 130 mg/dL — ABNORMAL HIGH (ref 0–99)
Triglycerides: 97 mg/dL (ref 0–149)
VLDL Cholesterol Cal: 17 mg/dL (ref 5–40)

## 2023-02-14 LAB — CMP14+EGFR
ALT: 24 [IU]/L (ref 0–44)
AST: 30 [IU]/L (ref 0–40)
Albumin: 4.5 g/dL (ref 3.8–4.9)
Alkaline Phosphatase: 61 [IU]/L (ref 44–121)
BUN/Creatinine Ratio: 10 (ref 9–20)
BUN: 15 mg/dL (ref 6–24)
Bilirubin Total: 0.6 mg/dL (ref 0.0–1.2)
CO2: 26 mmol/L (ref 20–29)
Calcium: 9.3 mg/dL (ref 8.7–10.2)
Chloride: 99 mmol/L (ref 96–106)
Creatinine, Ser: 1.44 mg/dL — ABNORMAL HIGH (ref 0.76–1.27)
Globulin, Total: 3 g/dL (ref 1.5–4.5)
Glucose: 91 mg/dL (ref 70–99)
Potassium: 3.4 mmol/L — ABNORMAL LOW (ref 3.5–5.2)
Sodium: 140 mmol/L (ref 134–144)
Total Protein: 7.5 g/dL (ref 6.0–8.5)
eGFR: 57 mL/min/{1.73_m2} — ABNORMAL LOW (ref >59)

## 2023-02-14 LAB — VITAMIN D 25 HYDROXY (VIT D DEFICIENCY, FRACTURES): Vit D, 25-Hydroxy: 86.3 ng/mL (ref 30.0–100.0)

## 2023-02-14 LAB — PSA: Prostate Specific Ag, Serum: 2.9 ng/mL (ref 0.0–4.0)

## 2023-02-14 LAB — TSH: TSH: 1.09 u[IU]/mL (ref 0.450–4.500)

## 2023-02-16 ENCOUNTER — Encounter: Payer: Self-pay | Admitting: Family Medicine

## 2023-03-06 ENCOUNTER — Other Ambulatory Visit: Payer: Self-pay | Admitting: Urology

## 2023-03-06 DIAGNOSIS — N5201 Erectile dysfunction due to arterial insufficiency: Secondary | ICD-10-CM

## 2023-03-27 ENCOUNTER — Other Ambulatory Visit: Payer: No Typology Code available for payment source

## 2023-04-03 ENCOUNTER — Ambulatory Visit: Payer: Self-pay | Admitting: Urology

## 2023-04-03 DIAGNOSIS — N5201 Erectile dysfunction due to arterial insufficiency: Secondary | ICD-10-CM

## 2023-07-14 ENCOUNTER — Ambulatory Visit: Payer: No Typology Code available for payment source | Admitting: Family Medicine

## 2023-07-21 ENCOUNTER — Encounter: Payer: Self-pay | Admitting: Family Medicine

## 2023-08-17 ENCOUNTER — Emergency Department (HOSPITAL_COMMUNITY)
Admission: EM | Admit: 2023-08-17 | Discharge: 2023-08-17 | Disposition: A | Discharge status: Home / Self Care | Attending: Emergency Medicine | Admitting: Emergency Medicine

## 2023-08-17 ENCOUNTER — Emergency Department (HOSPITAL_COMMUNITY)

## 2023-08-17 ENCOUNTER — Encounter (HOSPITAL_COMMUNITY): Payer: Self-pay | Admitting: Emergency Medicine

## 2023-08-17 ENCOUNTER — Other Ambulatory Visit: Payer: Self-pay

## 2023-08-17 DIAGNOSIS — I10 Essential (primary) hypertension: Secondary | ICD-10-CM | POA: Diagnosis not present

## 2023-08-17 DIAGNOSIS — R079 Chest pain, unspecified: Secondary | ICD-10-CM | POA: Insufficient documentation

## 2023-08-17 DIAGNOSIS — R42 Dizziness and giddiness: Secondary | ICD-10-CM | POA: Insufficient documentation

## 2023-08-17 LAB — CBC
HCT: 48.1 % (ref 39.0–52.0)
Hemoglobin: 16.6 g/dL (ref 13.0–17.0)
MCH: 30.9 pg (ref 26.0–34.0)
MCHC: 34.5 g/dL (ref 30.0–36.0)
MCV: 89.6 fL (ref 80.0–100.0)
Platelets: 223 K/uL (ref 150–400)
RBC: 5.37 MIL/uL (ref 4.22–5.81)
RDW: 12.4 % (ref 11.5–15.5)
WBC: 5.5 K/uL (ref 4.0–10.5)
nRBC: 0 % (ref 0.0–0.2)

## 2023-08-17 LAB — BASIC METABOLIC PANEL WITH GFR
Anion gap: 11 (ref 5–15)
BUN: 20 mg/dL (ref 6–20)
CO2: 27 mmol/L (ref 22–32)
Calcium: 9.2 mg/dL (ref 8.9–10.3)
Chloride: 98 mmol/L (ref 98–111)
Creatinine, Ser: 1.17 mg/dL (ref 0.61–1.24)
GFR, Estimated: >60 mL/min (ref >60)
Glucose, Bld: 101 mg/dL — ABNORMAL HIGH (ref 70–99)
Potassium: 3.1 mmol/L — ABNORMAL LOW (ref 3.5–5.1)
Sodium: 136 mmol/L (ref 135–145)

## 2023-08-17 LAB — HEPATIC FUNCTION PANEL
ALT: 35 U/L (ref 0–44)
AST: 29 U/L (ref 15–41)
Albumin: 3.7 g/dL (ref 3.5–5.0)
Alkaline Phosphatase: 43 U/L (ref 38–126)
Bilirubin, Direct: 0.1 mg/dL (ref 0.0–0.2)
Indirect Bilirubin: 0.4 mg/dL (ref 0.3–0.9)
Total Bilirubin: 0.5 mg/dL (ref 0.0–1.2)
Total Protein: 6.4 g/dL — ABNORMAL LOW (ref 6.5–8.1)

## 2023-08-17 LAB — LIPASE, BLOOD: Lipase: 105 U/L — ABNORMAL HIGH (ref 11–51)

## 2023-08-17 LAB — TROPONIN I (HIGH SENSITIVITY): Troponin I (High Sensitivity): 4 ng/L (ref <18)

## 2023-08-17 MED ORDER — ONDANSETRON HCL 4 MG/2ML IJ SOLN
4.0000 mg | Freq: Once | INTRAMUSCULAR | Status: AC
Start: 2023-08-17 — End: 2023-08-17
  Administered 2023-08-17: 4 mg via INTRAVENOUS
  Filled 2023-08-17: qty 2

## 2023-08-17 MED ORDER — IOHEXOL 350 MG/ML SOLN
100.0000 mL | Freq: Once | INTRAVENOUS | Status: AC | PRN
  Administered 2023-08-17: 100 mL via INTRAVENOUS

## 2023-08-17 MED ORDER — MORPHINE SULFATE (PF) 4 MG/ML IV SOLN
4.0000 mg | Freq: Once | INTRAVENOUS | Status: AC
  Administered 2023-08-17: 4 mg via INTRAVENOUS
  Filled 2023-08-17: qty 1

## 2023-08-17 NOTE — Discharge Instructions (Addendum)
 You were evaluated in the Emergency Department and after careful evaluation, we did not find any emergent condition requiring admission or further testing in the hospital.  Your exam/testing today is overall reassuring.  Symptoms may be related to muscular strain or spasm.  Recommend use of Tylenol  or Motrin  for discomfort, follow-up with cardiology.  Please return to the Emergency Department if you experience any worsening of your condition.   Thank you for allowing us  to be a part of your care.

## 2023-08-17 NOTE — ED Triage Notes (Signed)
 Pt c/o chest pain and lightheadedness x 6 days intermittently.

## 2023-08-17 NOTE — ED Provider Notes (Signed)
 [facility] Emergency Department Provider Note MRN:  [id number]  Arrival date & time: 08/17/23     Chief Complaint   Chest Pain   History of Present Illness   Evan Jacobson is a 58 y.o. year-old male with a history of GERD presenting to the ED with chief complaint of chest pain.  Sharp chest pain on the left side of the chest with radiation into the left neck.  Intermittently over the past 6 days.  Today radiating down into the abdomen.  Associated with lightheadedness.  Review of Systems  A thorough review of systems was obtained and all systems are negative except as noted in the HPI and PMH.   Patient's Health History    Past Medical History:  Diagnosis Date   Allergic rhinitis    Bipolar disorder (HCC) 08/25/2011   Chronic back pain    Concussion 2012   GERD (gastroesophageal reflux disease)    Groin pain, chronic, right 06/28/2014   Heart palpitations 06/18/2016   Hypertension    Persistent adjustment disorder 12/05/2015   Chronic adjustment disorder diagnosed by [facility] and counseling recomended   PTSD (post-traumatic stress disorder)    Sinusitis    Skin lump of leg, left 11/17/2017   Vertigo 01/08/2017    Past Surgical History:  Procedure Laterality Date   COLONOSCOPY N/A 08/12/2017   Procedure: COLONOSCOPY;  Surgeon: Golda Claudis PENNER, MD;  Location: [facility];  Service: Endoscopy;  Laterality: N/A;  730   COLONOSCOPY WITH PROPOFOL  N/A 11/26/2022   Procedure: COLONOSCOPY WITH PROPOFOL ;  Surgeon: Eartha Angelia Sieving, MD;  Location: [facility];  Service: Gastroenterology;  Laterality: N/A;  8:15AM;ASA 1   NO PAST SURGERIES     POLYPECTOMY  11/26/2022   Procedure: POLYPECTOMY;  Surgeon: Eartha Angelia, Sieving, MD;  Location: [facility];  Service: Gastroenterology;;    Family History  Problem Relation Age of Onset   Stroke Father    Hypertension Father    Hypertension Mother        GAB   Heart disease Mother        Angina    Hypertension Sister    Hypertension Sister    Hypertension Brother    Hypertension Brother    Colon cancer Neg Hx     Social History   Socioeconomic History   Marital status: Married    Spouse name: Not on file   Number of children: 2   Years of education: Not on file   Highest education level: Not on file  Occupational History    Comment: Audio video tech    Employer: [employer]  Tobacco Use   Smoking status: Never   Smokeless tobacco: Never  Vaping Use   Vaping status: Never Used  Substance and Sexual Activity   Alcohol use: Yes    Comment: occ; six pack per week   Drug use: No   Sexual activity: Yes    Birth control/protection: None  Other Topics Concern   Not on file  Social History Narrative   Not on file   Social Drivers of Health   Financial Resource Strain: Not on file  Food Insecurity: Not on file  Transportation Needs: Not on file  Physical Activity: Not on file  Stress: Not on file  Social Connections: Not on file  Intimate Partner Violence: Not on file     Physical Exam   Vitals:   08/17/23 0200 08/17/23 0215  BP: 130/77 110/72  Pulse: 76 (!) 55  Resp: 17 13  Temp:    SpO2: 92% 93%    CONSTITUTIONAL: Well-appearing, NAD NEURO/PSYCH:  Alert and oriented x 3, no focal deficits EYES:  eyes equal and reactive ENT/NECK:  no LAD, no JVD CARDIO: Regular rate, well-perfused, normal S1 and S2 PULM:  CTAB no wheezing or rhonchi GI/GU:  non-distended, non-tender MSK/SPINE:  No gross deformities, no edema SKIN:  no rash, atraumatic   *Additional and/or pertinent findings included in MDM below  Diagnostic and Interventional Summary    EKG Interpretation Date/Time:  Monday August 17 2023 00:19:40 EDT Ventricular Rate:  70 PR Interval:  141 QRS Duration:  88 QT Interval:  457 QTC Calculation: 494 R Axis:   54  Text Interpretation: Sinus rhythm Consider left atrial enlargement Borderline prolonged QT interval Confirmed by Theadore Sharper [phone])  on 08/17/2023 12:32:05 AM       Labs Reviewed  BASIC METABOLIC PANEL WITH GFR - Abnormal; Notable for the following components:      Result Value   Potassium 3.1 (*)    Glucose, Bld 101 (*)    All other components within normal limits  LIPASE, BLOOD - Abnormal; Notable for the following components:   Lipase 105 (*)    All other components within normal limits  HEPATIC FUNCTION PANEL - Abnormal; Notable for the following components:   Total Protein 6.4 (*)    All other components within normal limits  CBC  TROPONIN I (HIGH SENSITIVITY)    CT Angio Chest/Abd/Pel for Dissection W and/or Wo Contrast  Final Result    DG Chest Port 1 View  Final Result      Medications  morphine  (PF) 4 MG/ML injection 4 mg (4 mg Intravenous Given 08/17/23 0045)  ondansetron  (ZOFRAN ) injection 4 mg (4 mg Intravenous Given 08/17/23 0045)  iohexol  (OMNIPAQUE ) 350 MG/ML injection 100 mL (100 mLs Intravenous Contrast Given 08/17/23 0121)     Procedures  /  Critical Care Procedures  ED Course and Medical Decision Making  Initial Impression and Ddx Differential diagnosis includes ACS, dissection, MSK, less likely PE.  Would be an atypical presentation for ACS.  Little to no cardiovascular risk factors.  Given the radiation to the abdomen will obtain CT dissection study.  Past medical/surgical history that increases complexity of ED encounter: Hypertension  Interpretation of Diagnostics I personally reviewed the EKG and my interpretation is as follows: Sinus rhythm without concerning ischemic findings  No significant blood count or electrolyte disturbance.  Troponin negative.  CT dissection study is unremarkable.  Minimal lipase elevation of unclear significance.  Patient Reassessment and Ultimate Disposition/Management     Patient asymptomatic on reassessment with normal vitals.  Does not have any epigastric tenderness, CT did not show any signs of pancreatitis.  Favoring noncardiac, low heart score,  appropriate for discharge with follow-up.  Patient management required discussion with the following services or consulting groups:  None  Complexity of Problems Addressed Acute illness or injury that poses threat of life of bodily function  Additional Data Reviewed and Analyzed Further history obtained from: Prior labs/imaging results  Additional Factors Impacting ED Encounter Risk Consideration of hospitalization  Sharper HERO. Theadore, MD [facility] [email or web address]  Final Clinical Impressions(s) / ED Diagnoses     ICD-10-CM   1. Chest pain, unspecified type  R07.9 Ambulatory referral to Cardiology      ED Discharge Orders          Ordered    Ambulatory referral to  Cardiology        08/17/23 0236             Discharge Instructions Discussed with and Provided to Patient:     Discharge Instructions      You were evaluated in the Emergency Department and after careful evaluation, we did not find any emergent condition requiring admission or further testing in the hospital.  Your exam/testing today is overall reassuring.  Symptoms may be related to muscular strain or spasm.  Recommend use of Tylenol  or Motrin  for discomfort, follow-up with cardiology.  Please return to the Emergency Department if you experience any worsening of your condition.   Thank you for allowing us  to be a part of your care.       Theadore Ozell HERO, MD 08/17/23 [phone]

## 2023-11-11 ENCOUNTER — Encounter (INDEPENDENT_AMBULATORY_CARE_PROVIDER_SITE_OTHER): Payer: Self-pay | Admitting: Gastroenterology

## 2023-12-10 ENCOUNTER — Ambulatory Visit: Admitting: Cardiology
